# Patient Record
Sex: Female | Born: 1995 | Race: White | Hispanic: No | Marital: Single | Smoking: Never smoker
Health system: Southern US, Community
[De-identification: ages and names within clinical notes are randomized; demographics above are authoritative.]

## PROBLEM LIST (undated history)

## (undated) DIAGNOSIS — E669 Obesity, unspecified: Secondary | ICD-10-CM

## (undated) DIAGNOSIS — F39 Unspecified mood [affective] disorder: Secondary | ICD-10-CM

## (undated) DIAGNOSIS — E039 Hypothyroidism, unspecified: Secondary | ICD-10-CM

## (undated) DIAGNOSIS — Z789 Other specified health status: Secondary | ICD-10-CM

## (undated) DIAGNOSIS — F4310 Post-traumatic stress disorder, unspecified: Secondary | ICD-10-CM

## (undated) DIAGNOSIS — F79 Unspecified intellectual disabilities: Secondary | ICD-10-CM

## (undated) DIAGNOSIS — F909 Attention-deficit hyperactivity disorder, unspecified type: Secondary | ICD-10-CM

## (undated) DIAGNOSIS — F319 Bipolar disorder, unspecified: Secondary | ICD-10-CM

## (undated) DIAGNOSIS — F32A Depression, unspecified: Secondary | ICD-10-CM

## (undated) DIAGNOSIS — F329 Major depressive disorder, single episode, unspecified: Secondary | ICD-10-CM

## (undated) HISTORY — PX: NO PAST SURGERIES: SHX2092

## (undated) HISTORY — PX: OTHER SURGICAL HISTORY: SHX169

---
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
## 2013-01-01 ENCOUNTER — Encounter (HOSPITAL_COMMUNITY): Payer: Self-pay

## 2013-01-01 ENCOUNTER — Emergency Department (HOSPITAL_COMMUNITY)
Admission: EM | Admit: 2013-01-01 | Discharge: 2013-01-03 | Disposition: A | Discharge status: Home / Self Care | Payer: Medicaid Other | Attending: Emergency Medicine | Admitting: Emergency Medicine

## 2013-01-01 DIAGNOSIS — Z79899 Other long term (current) drug therapy: Secondary | ICD-10-CM | POA: Insufficient documentation

## 2013-01-01 DIAGNOSIS — F79 Unspecified intellectual disabilities: Secondary | ICD-10-CM | POA: Diagnosis present

## 2013-01-01 DIAGNOSIS — F3110 Bipolar disorder, current episode manic without psychotic features, unspecified: Secondary | ICD-10-CM

## 2013-01-01 DIAGNOSIS — F39 Unspecified mood [affective] disorder: Secondary | ICD-10-CM | POA: Insufficient documentation

## 2013-01-01 DIAGNOSIS — F319 Bipolar disorder, unspecified: Secondary | ICD-10-CM | POA: Diagnosis present

## 2013-01-01 DIAGNOSIS — E039 Hypothyroidism, unspecified: Secondary | ICD-10-CM | POA: Insufficient documentation

## 2013-01-01 DIAGNOSIS — R451 Restlessness and agitation: Secondary | ICD-10-CM | POA: Diagnosis present

## 2013-01-01 DIAGNOSIS — E669 Obesity, unspecified: Secondary | ICD-10-CM | POA: Insufficient documentation

## 2013-01-01 DIAGNOSIS — Z3202 Encounter for pregnancy test, result negative: Secondary | ICD-10-CM | POA: Insufficient documentation

## 2013-01-01 DIAGNOSIS — IMO0002 Reserved for concepts with insufficient information to code with codable children: Secondary | ICD-10-CM | POA: Insufficient documentation

## 2013-01-01 DIAGNOSIS — F29 Unspecified psychosis not due to a substance or known physiological condition: Secondary | ICD-10-CM

## 2013-01-01 DIAGNOSIS — R45850 Homicidal ideations: Secondary | ICD-10-CM | POA: Insufficient documentation

## 2013-01-01 DIAGNOSIS — E119 Type 2 diabetes mellitus without complications: Secondary | ICD-10-CM | POA: Insufficient documentation

## 2013-01-01 DIAGNOSIS — F909 Attention-deficit hyperactivity disorder, unspecified type: Secondary | ICD-10-CM | POA: Diagnosis present

## 2013-01-01 DIAGNOSIS — F4310 Post-traumatic stress disorder, unspecified: Secondary | ICD-10-CM | POA: Diagnosis present

## 2013-01-01 DIAGNOSIS — M25569 Pain in unspecified knee: Secondary | ICD-10-CM | POA: Insufficient documentation

## 2013-01-01 LAB — ETHANOL: Alcohol, Ethyl (B): <11 mg/dL (ref 0–11)

## 2013-01-01 LAB — CBC
HCT: 37.4 % (ref 36.0–46.0)
Hemoglobin: 12.3 g/dL (ref 12.0–15.0)
MCH: 30.2 pg (ref 26.0–34.0)
MCHC: 32.9 g/dL (ref 30.0–36.0)
MCV: 91.9 fL (ref 78.0–100.0)
Platelets: 287 10*3/uL (ref 150–400)
RBC: 4.07 MIL/uL (ref 3.87–5.11)
RDW: 13.6 % (ref 11.5–15.5)
WBC: 7.3 10*3/uL (ref 4.0–10.5)

## 2013-01-01 LAB — COMPREHENSIVE METABOLIC PANEL
ALT: 13 U/L (ref 0–35)
AST: 17 U/L (ref 0–37)
Albumin: 4 g/dL (ref 3.5–5.2)
Alkaline Phosphatase: 124 U/L — ABNORMAL HIGH (ref 39–117)
BUN: 9 mg/dL (ref 6–23)
CO2: 25 mEq/L (ref 19–32)
Calcium: 9.8 mg/dL (ref 8.4–10.5)
Chloride: 102 mEq/L (ref 96–112)
Creatinine, Ser: 0.61 mg/dL (ref 0.50–1.10)
GFR calc Af Amer: >90 mL/min (ref >90)
GFR calc non Af Amer: >90 mL/min (ref >90)
Glucose, Bld: 86 mg/dL (ref 70–99)
Potassium: 4.4 mEq/L (ref 3.5–5.1)
Sodium: 138 mEq/L (ref 135–145)
Total Bilirubin: 0.2 mg/dL — ABNORMAL LOW (ref 0.3–1.2)
Total Protein: 7.5 g/dL (ref 6.0–8.3)

## 2013-01-01 LAB — RAPID URINE DRUG SCREEN, HOSP PERFORMED
Amphetamines: NOT DETECTED
Barbiturates: NOT DETECTED
Benzodiazepines: NOT DETECTED
Cocaine: NOT DETECTED
Opiates: NOT DETECTED
Tetrahydrocannabinol: NOT DETECTED

## 2013-01-01 LAB — POCT PREGNANCY, URINE: Preg Test, Ur: NEGATIVE

## 2013-01-01 LAB — GLUCOSE, CAPILLARY: Glucose-Capillary: 93 mg/dL (ref 70–99)

## 2013-01-01 MED ORDER — OLANZAPINE 10 MG PO TBDP: 10.0000 mg | ORAL_TABLET | Freq: Every day | ORAL | Status: DC | PRN

## 2013-01-01 MED ORDER — FLUOXETINE HCL 20 MG PO CAPS
20.0000 mg | ORAL_CAPSULE | Freq: Every morning | ORAL | Status: DC
  Administered 2013-01-02 – 2013-01-03 (×2): 20 mg via ORAL
  Filled 2013-01-01 (×2): qty 1

## 2013-01-01 MED ORDER — LACTINEX PO CHEW
1.0000 | CHEWABLE_TABLET | Freq: Three times a day (TID) | ORAL | Status: DC
  Administered 2013-01-02 – 2013-01-03 (×4): 1 via ORAL
  Filled 2013-01-01 (×7): qty 1

## 2013-01-01 MED ORDER — PANTOPRAZOLE SODIUM 40 MG PO TBEC
40.0000 mg | DELAYED_RELEASE_TABLET | Freq: Every day | ORAL | Status: DC
  Administered 2013-01-01 – 2013-01-03 (×3): 40 mg via ORAL
  Filled 2013-01-01 (×3): qty 1

## 2013-01-01 MED ORDER — LACTOBACILLUS PO CAPS: 1.0000 | ORAL_CAPSULE | Freq: Three times a day (TID) | ORAL | Status: DC

## 2013-01-01 MED ORDER — TRAZODONE HCL 100 MG PO TABS
100.0000 mg | ORAL_TABLET | Freq: Every day | ORAL | Status: DC
  Administered 2013-01-01 – 2013-01-02 (×2): 100 mg via ORAL
  Filled 2013-01-01 (×2): qty 1

## 2013-01-01 MED ORDER — IBUPROFEN 800 MG PO TABS
800.0000 mg | ORAL_TABLET | Freq: Once | ORAL | Status: AC
  Administered 2013-01-01: 800 mg via ORAL
  Filled 2013-01-01: qty 1

## 2013-01-01 MED ORDER — LEVOTHYROXINE SODIUM 100 MCG PO TABS
100.0000 ug | ORAL_TABLET | Freq: Every day | ORAL | Status: DC
  Administered 2013-01-02 – 2013-01-03 (×2): 100 ug via ORAL
  Filled 2013-01-01 (×3): qty 1

## 2013-01-01 MED ORDER — CARBAMAZEPINE 200 MG PO TABS
200.0000 mg | ORAL_TABLET | Freq: Two times a day (BID) | ORAL | Status: DC
  Administered 2013-01-01 – 2013-01-03 (×4): 200 mg via ORAL
  Filled 2013-01-01 (×5): qty 1

## 2013-01-01 MED ORDER — LORAZEPAM 1 MG PO TABS
1.0000 mg | ORAL_TABLET | Freq: Every day | ORAL | Status: DC
  Administered 2013-01-01 – 2013-01-02 (×2): 1 mg via ORAL
  Filled 2013-01-01 (×2): qty 1

## 2013-01-01 MED ORDER — METFORMIN HCL 500 MG PO TABS
500.0000 mg | ORAL_TABLET | Freq: Every day | ORAL | Status: DC
  Administered 2013-01-02 – 2013-01-03 (×2): 500 mg via ORAL
  Filled 2013-01-01 (×3): qty 1

## 2013-01-01 MED ORDER — LITHIUM CARBONATE ER 450 MG PO TBCR
450.0000 mg | EXTENDED_RELEASE_TABLET | Freq: Every day | ORAL | Status: DC
  Administered 2013-01-02 – 2013-01-03 (×2): 450 mg via ORAL
  Filled 2013-01-01 (×3): qty 1

## 2013-01-01 MED ORDER — LITHIUM CARBONATE ER 450 MG PO TBCR
900.0000 mg | EXTENDED_RELEASE_TABLET | Freq: Every evening | ORAL | Status: DC
  Administered 2013-01-01 – 2013-01-02 (×2): 900 mg via ORAL
  Filled 2013-01-01 (×3): qty 2

## 2013-01-01 MED ORDER — MEDROXYPROGESTERONE ACETATE 150 MG/ML IM SUSP: 150.0000 mg | INTRAMUSCULAR | Status: DC

## 2013-01-01 MED ORDER — LORAZEPAM 1 MG PO TABS: 1.0000 mg | ORAL_TABLET | Freq: Three times a day (TID) | ORAL | Status: DC | PRN

## 2013-01-01 MED ORDER — RISAQUAD PO CAPS
1.0000 | ORAL_CAPSULE | Freq: Every day | ORAL | Status: DC
  Administered 2013-01-01 – 2013-01-02 (×2): 1 via ORAL
  Filled 2013-01-01 (×3): qty 1

## 2013-01-01 MED ORDER — CALCIUM POLYCARBOPHIL 625 MG PO TABS
625.0000 mg | ORAL_TABLET | Freq: Every day | ORAL | Status: DC
  Administered 2013-01-02 – 2013-01-03 (×2): 625 mg via ORAL
  Filled 2013-01-01 (×4): qty 1

## 2013-01-01 MED ORDER — OLANZAPINE 5 MG PO TABS
5.0000 mg | ORAL_TABLET | Freq: Every day | ORAL | Status: DC
  Administered 2013-01-01 – 2013-01-02 (×2): 5 mg via ORAL
  Filled 2013-01-01 (×2): qty 1

## 2013-01-01 MED ORDER — METFORMIN HCL 500 MG PO TABS
1000.0000 mg | ORAL_TABLET | Freq: Every day | ORAL | Status: DC
  Administered 2013-01-02: 1000 mg via ORAL
  Filled 2013-01-01 (×2): qty 2
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
## 2013-01-01 NOTE — ED Notes (Signed)
Tele-assessment in progress at present.
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
## 2013-01-01 NOTE — ED Notes (Signed)
Pt lives in "[facility]" with a caregiver and has since Jun 05, 2012.  Pt states she is seeing blood everywhere.  Denies SI does feel HI toward her caregiver.  Also c/o diarrhea but denies any other medical issues at this time.  She is calm and cooperative at present time.  Caregiver wants it known that pt is lactose and gluten intolerant and a diabetic.
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
## 2013-01-01 NOTE — Progress Notes (Signed)
CSW received a call from pts care manager, Ronnie Doss, from [facility].  She wanted to call to report that pt often finds reasons to the hospital to get out of her structured environment and get food that is not on her diet.  Ms. Lindley Magnus reports that Ms. Riecke is on a gluten free/diabetic diet. Ms. Lindley Magnus reports that if there are any questions concerning pt that she can be reached at [phone].  Marva Panda, Theresia Majors  [phone]   .01/01/2013  8:27 pm

## 2013-01-02 ENCOUNTER — Inpatient Hospital Stay (HOSPITAL_COMMUNITY)
Admission: AD | Admit: 2013-01-02 | Discharge: 2013-01-02 | Disposition: A | Discharge status: Home / Self Care | Payer: Federal, State, Local not specified - Other | Source: Intra-hospital | Attending: Psychiatry | Admitting: Psychiatry

## 2013-01-02 ENCOUNTER — Encounter (HOSPITAL_COMMUNITY): Payer: Self-pay | Admitting: Registered Nurse

## 2013-01-02 DIAGNOSIS — F29 Unspecified psychosis not due to a substance or known physiological condition: Secondary | ICD-10-CM

## 2013-01-02 LAB — LITHIUM LEVEL: Lithium Lvl: 0.36 mEq/L — ABNORMAL LOW (ref 0.80–1.40)

## 2013-01-02 LAB — GLUCOSE, CAPILLARY
Glucose-Capillary: 93 mg/dL (ref 70–99)
Glucose-Capillary: 89 mg/dL (ref 70–99)
Glucose-Capillary: 82 mg/dL (ref 70–99)

## 2013-01-02 LAB — TSH: TSH: 1.73 u[IU]/mL (ref 0.350–4.500)

## 2013-01-02 NOTE — Progress Notes (Addendum)
CSW consulted with Minerva Areola at [facility] and CSW asst director concerning pt having a DSS guardian.  Patient accepted to [facility], but cannot not sign paperwork due to capacity.  CSW called DSS guardian, Rockne Menghini  @ [phone] and [phone].  Per voicemail, guardian will return @ 7 am.  CSW consulted with EDP concerning possible IVC for pt.  EDP prefers to follow up with guardian for signatures and observe pt tonight.    Marva Panda, Theresia Majors  [phone]  .01/02/2013  5:15 pm  CSW informed Minerva Areola @ [facility] of EDPs concerns and he confirmed that pt would still be accepted on 01/03/13 with guardian signatures.  Marva Panda, LCSWA  [phone]
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
## 2013-01-02 NOTE — ED Provider Notes (Signed)
Medical screening examination/treatment/procedure(s) were performed by non-physician practitioner and as supervising physician I was immediately available for consultation/collaboration.  Flint Melter, MD 01/02/13 [phone]
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
## 2013-01-03 DIAGNOSIS — F909 Attention-deficit hyperactivity disorder, unspecified type: Secondary | ICD-10-CM | POA: Diagnosis present

## 2013-01-03 DIAGNOSIS — R451 Restlessness and agitation: Secondary | ICD-10-CM | POA: Diagnosis present

## 2013-01-03 DIAGNOSIS — F79 Unspecified intellectual disabilities: Secondary | ICD-10-CM | POA: Diagnosis present

## 2013-01-03 DIAGNOSIS — F319 Bipolar disorder, unspecified: Secondary | ICD-10-CM

## 2013-01-03 DIAGNOSIS — F4310 Post-traumatic stress disorder, unspecified: Secondary | ICD-10-CM | POA: Diagnosis present

## 2013-01-03 LAB — GLUCOSE, CAPILLARY: Glucose-Capillary: 94 mg/dL (ref 70–99)

## 2013-01-03 NOTE — Progress Notes (Signed)
CSW left message with  with patient guardian at [phone] and at [phone] regarding patient voluntary admission and consent to release information. CSW will continue to try to reach patient guardian.   Catha Gosselin, LCSW [phone]  ED CSW 01/03/2013 726am

## 2013-01-03 NOTE — Progress Notes (Addendum)
Per discussion with psychiatrist pt denies SI/HI/Ah/VH and psychiatrically stable for discharge back to [facility]. CSW spoke with Ronnie Doss at [phone] and she will arrange Transportation to be provided by pt caregiver [name]. [name] can be reached at [phone].   Marland KitchenFrutoso Schatz [phone]  ED CSW 01/03/2013 9:51am   CSW spoke with Ronnie Doss, and patient transportation is on the way. ED and RN aware.   Frutoso Schatz [phone]  ED CSW 01/03/2013 11:59am

## 2013-01-03 NOTE — Progress Notes (Signed)
Ms Perrault says she is no longer seeing or dreaming blood and has no desire to kill or hurt anyone including her caregiver, Ms Sim Boast.  She says she wants to go home rather than to a hospital.  Says she does not like Ms Sim Boast but can get along with her as she has nowhere else to go.  Assuming it is OK with her caregiver, discharge today.

## 2013-01-03 NOTE — ED Provider Notes (Addendum)
11:38 AM Psych recommending d/c to group home. I re-examined the pt and reviewed her labwork prior to d/c.   11:39 AM:  I have discussed the diagnosis/risks/treatment options with the patient and believe the pt to be eligible for discharge home to follow-up with pcp as needed. We also discussed returning to the ED immediately if new or worsening sx occur. We discussed the sx which are most concerning (e.g., worsening mood disorder) that necessitate immediate return. Any new prescriptions provided to the patient are listed below.  Clinical Impression 1. Bipolar 1 disorder   2. ADHD (attention deficit hyperactivity disorder)   3. PTSD (post-traumatic stress disorder)   4. Mental retardation   5. Agitation      Junius Argyle, MD 01/03/13 2003  Junius Argyle, MD 01/03/13 2003

## 2013-01-03 NOTE — ED Notes (Signed)
Pt is currently denying any SI/HI/AVH. Pt is denying any pain. Pt is clam and cooperative.

## 2013-01-04 NOTE — Consult Note (Signed)
Reviewed the information documented and agree with the treatment plan.  Braison Snoke,JANARDHAHA R. 01/04/2013 11:25 AM

## 2013-02-11 ENCOUNTER — Emergency Department (HOSPITAL_COMMUNITY)
Admission: EM | Admit: 2013-02-11 | Discharge: 2013-02-12 | Disposition: A | Discharge status: Home / Self Care | Payer: Medicaid Other | Attending: Emergency Medicine | Admitting: Emergency Medicine

## 2013-02-11 ENCOUNTER — Encounter (HOSPITAL_COMMUNITY): Payer: Self-pay

## 2013-02-11 DIAGNOSIS — F4310 Post-traumatic stress disorder, unspecified: Secondary | ICD-10-CM | POA: Insufficient documentation

## 2013-02-11 DIAGNOSIS — E119 Type 2 diabetes mellitus without complications: Secondary | ICD-10-CM | POA: Insufficient documentation

## 2013-02-11 DIAGNOSIS — Z3202 Encounter for pregnancy test, result negative: Secondary | ICD-10-CM | POA: Insufficient documentation

## 2013-02-11 DIAGNOSIS — Z79899 Other long term (current) drug therapy: Secondary | ICD-10-CM | POA: Insufficient documentation

## 2013-02-11 DIAGNOSIS — R51 Headache: Secondary | ICD-10-CM | POA: Insufficient documentation

## 2013-02-11 DIAGNOSIS — E669 Obesity, unspecified: Secondary | ICD-10-CM | POA: Insufficient documentation

## 2013-02-11 DIAGNOSIS — F319 Bipolar disorder, unspecified: Secondary | ICD-10-CM | POA: Insufficient documentation

## 2013-02-11 DIAGNOSIS — S51809A Unspecified open wound of unspecified forearm, initial encounter: Secondary | ICD-10-CM | POA: Insufficient documentation

## 2013-02-11 DIAGNOSIS — X789XXA Intentional self-harm by unspecified sharp object, initial encounter: Secondary | ICD-10-CM | POA: Insufficient documentation

## 2013-02-11 DIAGNOSIS — E039 Hypothyroidism, unspecified: Secondary | ICD-10-CM | POA: Insufficient documentation

## 2013-02-11 LAB — CBC WITH DIFFERENTIAL/PLATELET
Basophils Absolute: 0.1 10*3/uL (ref 0.0–0.1)
Basophils Relative: 1 % (ref 0–1)
Eosinophils Absolute: 0.1 10*3/uL (ref 0.0–0.7)
Eosinophils Relative: 2 % (ref 0–5)
HCT: 37.7 % (ref 36.0–46.0)
Hemoglobin: 12.8 g/dL (ref 12.0–15.0)
Lymphocytes Relative: 34 % (ref 12–46)
Lymphs Abs: 3.2 10*3/uL (ref 0.7–4.0)
MCH: 31.6 pg (ref 26.0–34.0)
MCHC: 34 g/dL (ref 30.0–36.0)
MCV: 93.1 fL (ref 78.0–100.0)
Monocytes Absolute: 0.7 10*3/uL (ref 0.1–1.0)
Monocytes Relative: 8 % (ref 3–12)
Neutro Abs: 5.3 10*3/uL (ref 1.7–7.7)
Neutrophils Relative %: 56 % (ref 43–77)
Platelets: 380 10*3/uL (ref 150–400)
RBC: 4.05 MIL/uL (ref 3.87–5.11)
RDW: 13.3 % (ref 11.5–15.5)
WBC: 9.4 10*3/uL (ref 4.0–10.5)

## 2013-02-11 LAB — COMPREHENSIVE METABOLIC PANEL
ALT: 5 U/L (ref 0–35)
AST: 14 U/L (ref 0–37)
Albumin: 3.8 g/dL (ref 3.5–5.2)
Alkaline Phosphatase: 129 U/L — ABNORMAL HIGH (ref 39–117)
BUN: 9 mg/dL (ref 6–23)
CO2: 23 mEq/L (ref 19–32)
Calcium: 9.6 mg/dL (ref 8.4–10.5)
Chloride: 101 mEq/L (ref 96–112)
Creatinine, Ser: 0.62 mg/dL (ref 0.50–1.10)
GFR calc Af Amer: >90 mL/min (ref >90)
GFR calc non Af Amer: >90 mL/min (ref >90)
Glucose, Bld: 95 mg/dL (ref 70–99)
Potassium: 3.5 mEq/L (ref 3.5–5.1)
Sodium: 137 mEq/L (ref 135–145)
Total Bilirubin: 0.1 mg/dL — ABNORMAL LOW (ref 0.3–1.2)
Total Protein: 7.7 g/dL (ref 6.0–8.3)

## 2013-02-11 LAB — LITHIUM LEVEL: Lithium Lvl: 0.63 mEq/L — ABNORMAL LOW (ref 0.80–1.40)

## 2013-02-11 LAB — RAPID URINE DRUG SCREEN, HOSP PERFORMED
Amphetamines: NOT DETECTED
Barbiturates: NOT DETECTED
Benzodiazepines: NOT DETECTED
Cocaine: NOT DETECTED
Opiates: NOT DETECTED
Tetrahydrocannabinol: NOT DETECTED

## 2013-02-11 LAB — POCT PREGNANCY, URINE: Preg Test, Ur: NEGATIVE

## 2013-02-11 LAB — CARBAMAZEPINE LEVEL, TOTAL: Carbamazepine Lvl: 7.5 ug/mL (ref 4.0–12.0)

## 2013-02-11 LAB — ETHANOL: Alcohol, Ethyl (B): <11 mg/dL (ref 0–11)

## 2013-02-11 MED ORDER — ALUM & MAG HYDROXIDE-SIMETH 200-200-20 MG/5ML PO SUSP: 30.0000 mL | ORAL | Status: DC | PRN

## 2013-02-11 MED ORDER — METFORMIN HCL 500 MG PO TABS
500.0000 mg | ORAL_TABLET | Freq: Every day | ORAL | Status: DC
  Administered 2013-02-12: 500 mg via ORAL
  Filled 2013-02-11 (×2): qty 1

## 2013-02-11 MED ORDER — LORAZEPAM 1 MG PO TABS
1.0000 mg | ORAL_TABLET | Freq: Three times a day (TID) | ORAL | Status: DC | PRN
  Filled 2013-02-11: qty 1

## 2013-02-11 MED ORDER — OLANZAPINE 10 MG PO TBDP
10.0000 mg | ORAL_TABLET | Freq: Every day | ORAL | Status: DC | PRN
  Filled 2013-02-11: qty 1

## 2013-02-11 MED ORDER — CARBAMAZEPINE 200 MG PO TABS
200.0000 mg | ORAL_TABLET | Freq: Two times a day (BID) | ORAL | Status: DC
  Administered 2013-02-11 – 2013-02-12 (×2): 200 mg via ORAL
  Filled 2013-02-11 (×3): qty 1

## 2013-02-11 MED ORDER — LITHIUM CARBONATE ER 450 MG PO TBCR
450.0000 mg | EXTENDED_RELEASE_TABLET | Freq: Every day | ORAL | Status: DC
  Administered 2013-02-12: 450 mg via ORAL
  Filled 2013-02-11: qty 1

## 2013-02-11 MED ORDER — METFORMIN HCL 500 MG PO TABS
1000.0000 mg | ORAL_TABLET | Freq: Every day | ORAL | Status: DC
  Administered 2013-02-12: 1000 mg via ORAL
  Filled 2013-02-11: qty 2

## 2013-02-11 MED ORDER — PANTOPRAZOLE SODIUM 40 MG PO TBEC
40.0000 mg | DELAYED_RELEASE_TABLET | Freq: Every day | ORAL | Status: DC
  Administered 2013-02-11 – 2013-02-12 (×2): 40 mg via ORAL
  Filled 2013-02-11 (×2): qty 1

## 2013-02-11 MED ORDER — FLUOXETINE HCL 20 MG PO CAPS
20.0000 mg | ORAL_CAPSULE | Freq: Every morning | ORAL | Status: DC
  Administered 2013-02-12: 20 mg via ORAL
  Filled 2013-02-11: qty 1

## 2013-02-11 MED ORDER — METFORMIN HCL 500 MG PO TABS: 500.0000 mg | ORAL_TABLET | Freq: Two times a day (BID) | ORAL | Status: DC

## 2013-02-11 MED ORDER — ACETAMINOPHEN 325 MG PO TABS: 650.0000 mg | ORAL_TABLET | ORAL | Status: DC | PRN

## 2013-02-11 MED ORDER — OLANZAPINE 5 MG PO TABS
5.0000 mg | ORAL_TABLET | Freq: Every day | ORAL | Status: DC
  Administered 2013-02-11: 5 mg via ORAL
  Filled 2013-02-11: qty 1

## 2013-02-11 MED ORDER — LITHIUM CARBONATE ER 450 MG PO TBCR
900.0000 mg | EXTENDED_RELEASE_TABLET | Freq: Every day | ORAL | Status: DC
  Administered 2013-02-11: 900 mg via ORAL
  Filled 2013-02-11 (×2): qty 2

## 2013-02-11 MED ORDER — LITHIUM CARBONATE ER 450 MG PO TBCR: 450.0000 mg | EXTENDED_RELEASE_TABLET | Freq: Two times a day (BID) | ORAL | Status: DC

## 2013-02-11 MED ORDER — ONDANSETRON HCL 4 MG PO TABS: 4.0000 mg | ORAL_TABLET | Freq: Three times a day (TID) | ORAL | Status: DC | PRN

## 2013-02-11 MED ORDER — TRAZODONE HCL 100 MG PO TABS
100.0000 mg | ORAL_TABLET | Freq: Every day | ORAL | Status: DC
  Administered 2013-02-11: 100 mg via ORAL
  Filled 2013-02-11: qty 1

## 2013-02-11 MED ORDER — LEVOTHYROXINE SODIUM 100 MCG PO TABS
100.0000 ug | ORAL_TABLET | Freq: Every day | ORAL | Status: DC
  Administered 2013-02-12: 100 ug via ORAL
  Filled 2013-02-11 (×2): qty 1

## 2013-02-11 NOTE — ED Notes (Signed)
Group Home called [facility] regarding patient. Patient went into the woods and cut self on her left arm multiple times. Cuts superficial to left arm. Patient has purple hands and states she ate some kind of berries in the woods. Patient c/o "upset stomach." Patient states she is suicidal, but does not have a plan. Patient deneis any alcohol or drug use.

## 2013-02-11 NOTE — ED Notes (Signed)
2 belonging bags placed in locker 27

## 2013-02-11 NOTE — Progress Notes (Signed)
Patient confirms her pcp is Dr. Parke Simmers.  Patient reports, "It's been awhile"  since she has seen Dr. Parke Simmers.

## 2013-02-11 NOTE — ED Provider Notes (Signed)
CSN: [id number]     Arrival date & time 02/11/13  1604 History  This chart was scribed for Arthor Captain PA-C, a non-physician practitioner working with Juliet Rude. Rubin Payor, MD by Lewanda Rife, ED Scribe. This patient was seen in room [facility] and the patient's care was started at 6:34 PM       Chief Complaint  Patient presents with  . Medical Clearance  . Suicidal   The history is provided by the patient. No language interpreter was used.   HPI Comments: Holly [name] is a 17 y.o. female  With mild MR who presents to the Emergency Department complaining of superficial lacerations on bilateral forearms. Reports she was switched from Day program 2 weeks ago and this precipitated suicidal ideations. Reports suicidal ideations have resolved at this time. Reports she feels safe at home. Reports eating three unidentified berries in the woods earlier today. Reports mild headache. Denies asociated nausea, emesis, diarrhea, chest pain, and shortness of breath. Denies any pain at this time.   Past Medical History  Diagnosis Date  . Bipolar 1 disorder   . Gluten free diet   . ADHD (attention deficit hyperactivity disorder)   . PTSD (post-traumatic stress disorder)   . 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 Mood disorder   . Diabetes mellitus   . Obesity   . Depression   . Hypothyroidism    Past Surgical History  Procedure Laterality Date  . None     Family History  Problem Relation Age of Onset  . Family history unknown: Yes   History  Substance Use Topics  . Smoking status: Never Smoker   . Smokeless tobacco: Never Used  . Alcohol Use: No   OB History   Grav Para Term Preterm Abortions TAB SAB Ect Mult Living                 Review of Systems  Gastrointestinal: Negative.   Neurological: Positive for headaches.  Psychiatric/Behavioral: Negative for suicidal ideas.  All other systems reviewed and are negative.   A complete 10 system review of systems was obtained  and all systems are negative except as noted in the HPI and PMHx.    Allergies  Gluten meal and Lactose intolerance (gi)  Home Medications   Current Outpatient Rx  Name  Route  Sig  Dispense  Refill  . adapalene (DIFFERIN) 0.1 % cream   Topical   Apply 1 application topically at bedtime. Applied to face for acne.         . carbamazepine (TEGRETOL) 200 MG tablet   Oral   Take 1 tablet (200 mg total) by mouth 2 (two) times daily.   60 tablet   0   . clindamycin-benzoyl peroxide (BENZACLIN) gel   Topical   Apply 1 application topically every morning. Applied to face for acne.         Marland Kitchen FLUoxetine (PROZAC) 20 MG capsule   Oral   Take 20 mg by mouth every morning.         Marland Kitchen LACTOBACILLUS PO   Oral   Take 1 tablet by mouth 3 (three) times daily.          Marland Kitchen levothyroxine (SYNTHROID, LEVOTHROID) 100 MCG tablet   Oral   Take 100 mcg by mouth daily before breakfast.          . lithium carbonate (ESKALITH) 450 MG CR tablet   Oral   Take 450-900 mg by mouth 2 (  two) times daily. Takes 1 tablet in morning and 2 tablets in evening         . LORazepam (ATIVAN) 1 MG tablet   Oral   Take 1 mg by mouth at bedtime.         Marland Kitchen LORazepam (ATIVAN) 1 MG tablet   Oral   Take 1 mg by mouth every 8 (eight) hours as needed for anxiety.         . medroxyPROGESTERone (DEPO-PROVERA) 150 MG/ML injection   Intramuscular   Inject 150 mg into the muscle every 3 (three) months.         . metFORMIN (GLUCOPHAGE) 500 MG tablet   Oral   Take 500-1,000 mg by mouth 2 (two) times daily with a meal. 500 mg in the morning and 1000 mg at bedtime         . OLANZapine (ZYPREXA) 5 MG tablet   Oral   Take 5 mg by mouth at bedtime.         Marland Kitchen OLANZapine zydis (ZYPREXA) 10 MG disintegrating tablet   Oral   Take 10 mg by mouth daily as needed (behavior).         Marland Kitchen omeprazole (PRILOSEC) 20 MG capsule   Oral   Take 20 mg by mouth every morning.          . polycarbophil (FIBERCON)  625 MG tablet   Oral   Take 625 mg by mouth daily.         . Probiotic Product (ALIGN) 4 MG CAPS   Oral   Take 1 capsule by mouth at bedtime.          . traZODone (DESYREL) 100 MG tablet   Oral   Take 100 mg by mouth at bedtime.          Triage Vitals:BP 121/78  Pulse 86  Temp(Src) 97.8 F (36.6 C) (Oral)  Resp 16  SpO2 98%  Physical Exam  Nursing note and vitals reviewed. Constitutional: She is oriented to person, place, and time. She appears well-developed and well-nourished. No distress.  HENT:  Head: Normocephalic and atraumatic.  Eyes: EOM are normal.  Neck: Neck supple. No tracheal deviation present.  Cardiovascular: Normal rate and intact distal pulses.   No murmur heard. Pulmonary/Chest: Effort normal. No respiratory distress.  Abdominal: Soft.  Musculoskeletal: Normal range of motion.  Neurological: She is alert and oriented to person, place, and time.  Skin: Skin is warm and dry. Laceration noted.  Multiple well healing uninfected self inflicted superficial lacerations of bilateral forearms.  Psychiatric: She has a normal mood and affect. Her behavior is normal. Thought content normal. She expresses no homicidal and no suicidal ideation.    ED Course  Procedures  DIAGNOSTIC STUDIES: Oxygen Saturation is 98% on RA, normal by my interpretation.   Medications - No data to display  COORDINATION OF CARE: 5:01 PM-Discussed treatment plan which includes Ethanol, salicylate level, acetaminophen level, CMP, CBC,, POCT pregnancy and UDS with pt at bedside and pt agreed to plan.   Labs Review Labs Reviewed  COMPREHENSIVE METABOLIC PANEL - Abnormal; Notable for the following:    Alkaline Phosphatase 129 (*)    Total Bilirubin 0.1 (*)    All other components within normal limits  CBC WITH DIFFERENTIAL  ETHANOL  URINE RAPID DRUG SCREEN (HOSP PERFORMED)  CARBAMAZEPINE LEVEL, TOTAL  LITHIUM LEVEL  POCT PREGNANCY, URINE   Imaging Review No results  found.  MDM  No diagnosis found. Patient with mild  MR, psych history. Self cutting. She is jovial, smiling and laughing at this time. Patient exhibits no sxs of poisoning,, and her labs appears stable. I have discussed the case with Dr. Rubin Payor who agrees that no further work up regarding the berry eating is necessary at this time.  Patient will be evaluated for SI/ Self-harm.  Patient denies any current Si/HI/AVH. She is taking all of her medications as directed.   I personally performed the services described in this documentation, which was scribed in my presence. The recorded information has been reviewed and is accurate.    Arthor Captain, PA-C 02/12/13 1103

## 2013-02-12 LAB — GLUCOSE, CAPILLARY: Glucose-Capillary: 119 mg/dL — ABNORMAL HIGH (ref 70–99)

## 2013-02-12 NOTE — ED Notes (Signed)
MD at bedside. 

## 2013-02-12 NOTE — Consult Note (Signed)
  Psychiatric Specialty Exam: @PHYSEXAMBYAGE2 @  @ROS @  Blood pressure 99/66, pulse 99, temperature 97.9 F (36.6 C), temperature source Oral, resp. rate 20, SpO2 98.00%.There is no weight on file to calculate BMI.  General Appearance: Casual  Eye Contact::  Good  Speech:  Clear and Coherent  Volume:  Normal  Mood:  Euthymic  Affect:  Appropriate  Thought Process:  Coherent and Logical  Orientation:  Full (Time, Place, and Person)  Thought Content:  Negative  Suicidal Thoughts:  No  Homicidal Thoughts:  No  Memory:  Immediate;   Fair Recent;   Fair Remote;   Fair  Judgement:  Intact  Insight:  Lacking  Psychomotor Activity:  Normal  Concentration:  Good  Recall:  Good  Akathisia:  Negative  Handed:  Right  AIMS (if indicated):     Assets:  Housing Physical Health  Sleep:   good   Ms Iseminger is not suicidal.  Says she scratched herself when she was upset but did not want to kill herself.  She is unhappy with her day program but not her group home.  Discharge today to be followed by outpatient.

## 2013-02-12 NOTE — BHH Counselor (Signed)
Writer left a Recruitment consultant message for the DSS worker Rockne Menghini [phone]).  Writer was not able to leave a voice mail message for the emergency contact person listed on the face sheet Violet Baldy [phone]) because the memory was full.   Writer informed the nurse working with the patient that the Clinical research associate is waiting to hear from the patients DSS worker.

## 2013-02-12 NOTE — BH Assessment (Signed)
Spoke with EDP Dr. Blinda Leatherwood who agreed d/c patient from ER. Pt's ride is here from group home and ready transport patient back to group home.  Glorious Peach, MS, LCASA Assessment Counselor
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
## 2013-02-12 NOTE — ED Provider Notes (Signed)
Patient not seen by me and I was not directly involved in the patient care. Behavioral Health/Psychiatry has evaluated the patient and, as per their note and recommendation, patient was to be discharged. Follow-up recommendations and medications determined by/prescribed by Behavioral Health/Psychiatry.   Gilda Crease, MD 02/12/13 [id number]

## 2013-02-12 NOTE — ED Notes (Signed)
Patient is resting comfortably. 

## 2013-02-12 NOTE — ED Notes (Signed)
Patient is resting comfortably. Given warm blanket.

## 2013-02-12 NOTE — ED Provider Notes (Signed)
Medical screening examination/treatment/procedure(s) were performed by non-physician practitioner and as supervising physician I was immediately available for consultation/collaboration.  Reyaansh Merlo R. Eldena Dede, MD 02/12/13 1431 

## 2013-02-12 NOTE — ED Notes (Signed)
Tele consult in progress 

## 2013-02-12 NOTE — BHH Counselor (Signed)
Writer spoke to the patients worker at the [employer] [phone]).  Writer was informed that she would be coming to pick up the patient between 5:30 and 6:00pm.   Writer informed the nurse Victorino Dike) working with the patient that the worker would be coming to pick up the patient during the designated time frame.

## 2013-02-12 NOTE — Progress Notes (Signed)
02/12/13 1332 [facility] ED AM CM informed by nursing staff that pt voiced concern about not having a glucometer at her facility, [facility], and to inquire about the name of the adult day program pt attends Left a message for Crystal at [phone] Cm informed Crystal was in a meeting

## 2013-02-13 NOTE — Progress Notes (Signed)
CM contacted by [employer] [facility] about pt and possible further resources for pt Cm spoke with SW.

## 2013-02-13 NOTE — Progress Notes (Signed)
[facility] ED CM received a return call from [employer] [phone]) Cm spoke with her and she confirms pt does have a glucometer at the facility [facility]) and her adult day care program name is [facility] [address] [place], [place] [address] [phone]. Cm discussed that chs does not provided glucometers and discounted stores that provided glucometers Crystal voiced concern about the need for a program to assist with preventing pt from having frequent ED visits. Reports pt does not qualify for ACT program/member through their services with the coverage she has.  Discussed with her that Cm would inquire of TTS of a possible resource and of possible development of care plan and have someone to contact her prn

## 2013-03-01 ENCOUNTER — Other Ambulatory Visit: Payer: Self-pay | Admitting: Family Medicine

## 2013-03-01 ENCOUNTER — Other Ambulatory Visit (HOSPITAL_COMMUNITY)
Admission: RE | Admit: 2013-03-01 | Discharge: 2013-03-01 | Disposition: A | Discharge status: Home / Self Care | Payer: Medicaid Other | Source: Ambulatory Visit | Attending: Family Medicine | Admitting: Family Medicine

## 2013-03-01 DIAGNOSIS — Z113 Encounter for screening for infections with a predominantly sexual mode of transmission: Secondary | ICD-10-CM | POA: Insufficient documentation

## 2013-03-01 DIAGNOSIS — N760 Acute vaginitis: Secondary | ICD-10-CM | POA: Insufficient documentation

## 2013-03-20 ENCOUNTER — Emergency Department (HOSPITAL_COMMUNITY)
Admission: EM | Admit: 2013-03-20 | Discharge: 2013-03-20 | Disposition: A | Discharge status: Home / Self Care | Payer: Medicaid Other | Attending: Emergency Medicine | Admitting: Emergency Medicine

## 2013-03-20 DIAGNOSIS — F70 Mild intellectual disabilities: Secondary | ICD-10-CM | POA: Insufficient documentation

## 2013-03-20 DIAGNOSIS — F4310 Post-traumatic stress disorder, unspecified: Secondary | ICD-10-CM | POA: Insufficient documentation

## 2013-03-20 DIAGNOSIS — E119 Type 2 diabetes mellitus without complications: Secondary | ICD-10-CM | POA: Insufficient documentation

## 2013-03-20 DIAGNOSIS — Y9389 Activity, other specified: Secondary | ICD-10-CM | POA: Insufficient documentation

## 2013-03-20 DIAGNOSIS — F411 Generalized anxiety disorder: Secondary | ICD-10-CM | POA: Insufficient documentation

## 2013-03-20 DIAGNOSIS — IMO0002 Reserved for concepts with insufficient information to code with codable children: Secondary | ICD-10-CM | POA: Insufficient documentation

## 2013-03-20 DIAGNOSIS — F79 Unspecified intellectual disabilities: Secondary | ICD-10-CM

## 2013-03-20 DIAGNOSIS — S0990XA Unspecified injury of head, initial encounter: Secondary | ICD-10-CM

## 2013-03-20 DIAGNOSIS — S060X0A Concussion without loss of consciousness, initial encounter: Secondary | ICD-10-CM | POA: Insufficient documentation

## 2013-03-20 DIAGNOSIS — Z3202 Encounter for pregnancy test, result negative: Secondary | ICD-10-CM | POA: Insufficient documentation

## 2013-03-20 DIAGNOSIS — Z79899 Other long term (current) drug therapy: Secondary | ICD-10-CM | POA: Insufficient documentation

## 2013-03-20 DIAGNOSIS — W2209XA Striking against other stationary object, initial encounter: Secondary | ICD-10-CM | POA: Insufficient documentation

## 2013-03-20 DIAGNOSIS — E039 Hypothyroidism, unspecified: Secondary | ICD-10-CM | POA: Insufficient documentation

## 2013-03-20 DIAGNOSIS — Y9229 Other specified public building as the place of occurrence of the external cause: Secondary | ICD-10-CM | POA: Insufficient documentation

## 2013-03-20 DIAGNOSIS — R45851 Suicidal ideations: Secondary | ICD-10-CM | POA: Insufficient documentation

## 2013-03-20 DIAGNOSIS — F319 Bipolar disorder, unspecified: Secondary | ICD-10-CM | POA: Insufficient documentation

## 2013-03-20 DIAGNOSIS — F410 Panic disorder [episodic paroxysmal anxiety] without agoraphobia: Secondary | ICD-10-CM

## 2013-03-20 DIAGNOSIS — E669 Obesity, unspecified: Secondary | ICD-10-CM | POA: Insufficient documentation

## 2013-03-20 LAB — POCT PREGNANCY, URINE: Preg Test, Ur: NEGATIVE

## 2013-03-20 NOTE — ED Notes (Signed)
Call attempt to group home again, no answer, CW notified

## 2013-03-20 NOTE — ED Notes (Signed)
[name] here to pick up pt, states she was at work.
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
## 2013-03-20 NOTE — ED Notes (Signed)
Call attempted again to Group home[facility], no answer

## 2013-03-20 NOTE — Progress Notes (Signed)
[facility] ED CM contacted [facility] ED SW to receive assist with transportation and to assist with caregiver issues. Care giver has not arrived to pick pt up after called by ED RN and pt leaving messages Pt states caregiver, [name] and she lives in [place]

## 2013-03-20 NOTE — Progress Notes (Signed)
CSW consulted by case management concerning pts being discharged and not being able to get any one from pts group home to come to pick patient up for several hours.  CSW attempted to call  group home contact Holly [name] at [phone] two times. CSW was unable to leave a message because voicemail indicated that it was full.    CSW attempted to call additional number in pts record [phone] and it was not a working number.   CSW consulted with pts nurse who reported that she had tried to call group home several times and was not able to get an answer or leave a message.  Nurse reported that she had spoken with Holly [name] earlier in day to let her know that pt was ready to be picked up and that Holly [name] asked where pt was and then hung up on nurse.  CSW spoke with pt at bedside with pt to inquire if she still has a Child psychotherapist at [employer].  Pt reports that her social worker is Holly [name], but that she would not be able to be reached now.   CSW called Rockne Menghini at [phone] and left a message.  CSW contacted contacted DSS on-call to inquire about additional contact information and ask about what would need to be done if hospital was not able to get in contact with some one at the group home.  CSW received a call from DSS on call worker Holly [name], who reported that there was an additional number for Holly [name] [phone].  On call DSS worker asked CSW to call if hospital was still unable to contact pts caregiver. His direct number [phone]. CSW  Attempted to call the number and left a message on voicemail.  CSW checked in with nurse who reported that Holly [name] had just left with pt and pt was discharged at 7:03 pm. CSW contacted on call DSS worker Holly [name] to let him know that pt had been picked up.  On call worker reported that he would make contact with pts DSS worker Holly [name] tomorrow concerning pt and the situation so that the worker could follow up with caregiver and  pt.   Holly [name], Holly [name]  [phone]  .03/20/2013

## 2013-03-20 NOTE — ED Notes (Signed)
Pt called back to group home to see why her ride has not got here yet, no answer

## 2013-03-20 NOTE — ED Provider Notes (Signed)
CSN: [id number]     Arrival date & time 03/20/13  1448 History   First MD Initiated Contact with Patient 03/20/13 1511     Chief Complaint  Patient presents with  . 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, bipolar disorder, chronic anxiety, borderline diabetes, chronic depression, chronic mild suicidal ideation without plan, she is in a new group home for the last couple months, she also has been a new consult a program for the last couple months, she states she was feeling fine all day today until she got upset about some food that was presented to her, she then had an argument with the group home staff, she bumped her head against the wall which resulted in a temporary tenderness to her scalp which is gone now, she also had some superficial abrasions to her hands, she states her tetanus shot is up-to-date, she is no headache now and she is no amnesia no loss of consciousness no change in mental status no change in speech vision swallowing or understanding no focal weakness numbness or change in bowel or bladder function or change in coordination, she is no neck pain back pain chest pain shortness breath or abdominal pain, she denies any suicidal plan, she states she will not hurt herself or others, she states she has baseline mild chronic suicidal ideation which is unchanged in months, she denies any threats to harm others, she denies any hallucinations, she states she has calmed down and is back to baseline now.  Past Medical History  Diagnosis Date  . Bipolar 1 disorder   . Gluten free diet   . ADHD (attention deficit hyperactivity disorder)   . PTSD (post-traumatic stress disorder)   . 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 Mood disorder   . Diabetes mellitus   . Obesity   . Depression   . Hypothyroidism    Past Surgical History  Procedure Laterality Date   . None     Family History  Problem Relation Age of Onset  . Family history unknown: Yes   History  Substance Use Topics  . Smoking status: Never Smoker   . Smokeless tobacco: Never Used  . Alcohol Use: No   OB History   Grav Para Term Preterm Abortions TAB SAB Ect Mult Living                 Review of Systems 10 Systems reviewed and are negative for acute change except as noted in the HPI. Allergies  Gluten meal and Lactose intolerance (gi)  Home Medications   Current Outpatient Rx  Name  Route  Sig  Dispense  Refill  . adapalene (DIFFERIN) 0.1 % cream   Topical   Apply 1 application topically at bedtime. Applied to face for acne.         . carbamazepine (TEGRETOL) 200 MG tablet   Oral   Take 1 tablet (200 mg total) by mouth 2 (two) times daily.   60 tablet   0   . clindamycin-benzoyl peroxide (BENZACLIN) gel   Topical   Apply 1 application topically every morning. Applied to face for acne.         Marland Kitchen FLUoxetine (PROZAC) 20 MG capsule   Oral   Take 20 mg by mouth every morning.         Marland Kitchen levothyroxine (SYNTHROID, LEVOTHROID) 100 MCG tablet   Oral   Take 100 mcg by mouth daily  before breakfast.          . lithium carbonate (ESKALITH) 450 MG CR tablet   Oral   Take 450-900 mg by mouth 2 (two) times daily. Takes 1 tablet in morning and 2 tablets in evening         . LORazepam (ATIVAN) 1 MG tablet   Oral   Take 1 mg by mouth every 8 (eight) hours as needed for anxiety.         . medroxyPROGESTERone (DEPO-PROVERA) 150 MG/ML injection   Intramuscular   Inject 150 mg into the muscle every 3 (three) months.         . metFORMIN (GLUCOPHAGE) 500 MG tablet   Oral   Take 500-1,000 mg by mouth 2 (two) times daily with a meal. 500 mg in the morning and 1000 mg at bedtime         . OLANZapine zydis (ZYPREXA) 10 MG disintegrating tablet   Oral   Take 10 mg by mouth daily as needed (behavior).         Marland Kitchen omeprazole (PRILOSEC) 20 MG capsule    Oral   Take 20 mg by mouth every morning.          . polycarbophil (FIBERCON) 625 MG tablet   Oral   Take 625 mg by mouth daily.         . traZODone (DESYREL) 100 MG tablet   Oral   Take 100 mg by mouth at bedtime.          BP 123/78  Pulse 88  Temp(Src) 98.3 F (36.8 C) (Oral)  Resp 18  SpO2 97% Physical Exam  Nursing note and vitals reviewed. Constitutional: She is oriented to person, place, and time.  Awake, alert, nontoxic appearance with baseline speech for patient.  HENT:  Head: Atraumatic.  Mouth/Throat: No oropharyngeal exudate.  Eyes: EOM are normal. Pupils are equal, round, and reactive to light. Right eye exhibits no discharge. Left eye exhibits no discharge.  Neck: Neck supple.  Cardiovascular: Normal rate and regular rhythm.   No murmur heard. Pulmonary/Chest: Effort normal and breath sounds normal. No stridor. No respiratory distress. She has no wheezes. She has no rales. She exhibits no tenderness.  Abdominal: Soft. Bowel sounds are normal. She exhibits no mass. There is no tenderness. There is no rebound.  Musculoskeletal: She exhibits no tenderness.  Baseline ROM, moves extremities with no obvious new focal weakness.  Lymphadenopathy:    She has no cervical adenopathy.  Neurological: She is alert and oriented to person, place, and time.  Awake, alert, cooperative and aware of situation; motor strength bilaterally; sensation normal to light touch bilaterally; peripheral visual fields full to confrontation; no facial asymmetry; tongue midline; major cranial nerves appear intact; no pronator drift, normal finger to nose bilaterally, baseline gait without new ataxia.  Skin: No rash noted.  Psychiatric: She has a normal mood and affect.  Calm cooperative states will not hurt herself or others    ED Course  Procedures (including critical care time) Labs Review Labs Reviewed  POCT PREGNANCY, URINE   Imaging Review No results found.  EKG  Interpretation   None       MDM   1. Anxiety attack   2. Mental retardation   3. Minor head injury without loss of consciousness, initial encounter    I doubt any other EMC precluding discharge at this time including, but not necessarily limited to the following: SI requiring admit, any HI, or psychosis.  Hurman Horn, MD 03/25/13 [phone]

## 2013-03-20 NOTE — ED Notes (Signed)
I spoke to Holly [name], notified pt is being d/c's and needs transport, she asked where she was and I told her and she hung up. Pt getting dress and will sit in consult room and wait for ride

## 2013-04-19 ENCOUNTER — Encounter (HOSPITAL_COMMUNITY): Payer: Self-pay | Admitting: Emergency Medicine

## 2013-04-19 ENCOUNTER — Emergency Department (HOSPITAL_COMMUNITY)
Admission: EM | Admit: 2013-04-19 | Discharge: 2013-04-20 | Disposition: A | Discharge status: Home / Self Care | Payer: Medicaid Other | Attending: Emergency Medicine | Admitting: Emergency Medicine

## 2013-04-19 DIAGNOSIS — H5316 Psychophysical visual disturbances: Secondary | ICD-10-CM | POA: Insufficient documentation

## 2013-04-19 DIAGNOSIS — F79 Unspecified intellectual disabilities: Secondary | ICD-10-CM

## 2013-04-19 DIAGNOSIS — E669 Obesity, unspecified: Secondary | ICD-10-CM | POA: Insufficient documentation

## 2013-04-19 DIAGNOSIS — R443 Hallucinations, unspecified: Secondary | ICD-10-CM

## 2013-04-19 DIAGNOSIS — E039 Hypothyroidism, unspecified: Secondary | ICD-10-CM | POA: Insufficient documentation

## 2013-04-19 DIAGNOSIS — E119 Type 2 diabetes mellitus without complications: Secondary | ICD-10-CM | POA: Insufficient documentation

## 2013-04-19 DIAGNOSIS — F29 Unspecified psychosis not due to a substance or known physiological condition: Secondary | ICD-10-CM

## 2013-04-19 DIAGNOSIS — F319 Bipolar disorder, unspecified: Secondary | ICD-10-CM

## 2013-04-19 DIAGNOSIS — F4310 Post-traumatic stress disorder, unspecified: Secondary | ICD-10-CM

## 2013-04-19 DIAGNOSIS — Z79899 Other long term (current) drug therapy: Secondary | ICD-10-CM | POA: Insufficient documentation

## 2013-04-19 DIAGNOSIS — Z3202 Encounter for pregnancy test, result negative: Secondary | ICD-10-CM | POA: Insufficient documentation

## 2013-04-19 LAB — COMPREHENSIVE METABOLIC PANEL
ALT: 7 U/L (ref 0–35)
AST: 12 U/L (ref 0–37)
Albumin: 3.9 g/dL (ref 3.5–5.2)
Alkaline Phosphatase: 102 U/L (ref 39–117)
BUN: 7 mg/dL (ref 6–23)
CO2: 22 mEq/L (ref 19–32)
Calcium: 9.4 mg/dL (ref 8.4–10.5)
Chloride: 100 mEq/L (ref 96–112)
Creatinine, Ser: 0.6 mg/dL (ref 0.50–1.10)
GFR calc Af Amer: >90 mL/min (ref >90)
GFR calc non Af Amer: >90 mL/min (ref >90)
Glucose, Bld: 95 mg/dL (ref 70–99)
Potassium: 3.3 mEq/L — ABNORMAL LOW (ref 3.5–5.1)
Sodium: 133 mEq/L — ABNORMAL LOW (ref 135–145)
Total Bilirubin: 0.1 mg/dL — ABNORMAL LOW (ref 0.3–1.2)
Total Protein: 7.6 g/dL (ref 6.0–8.3)

## 2013-04-19 LAB — CBC
HCT: 35.9 % — ABNORMAL LOW (ref 36.0–46.0)
Hemoglobin: 12.3 g/dL (ref 12.0–15.0)
MCH: 31.8 pg (ref 26.0–34.0)
MCHC: 34.3 g/dL (ref 30.0–36.0)
MCV: 92.8 fL (ref 78.0–100.0)
Platelets: 313 10*3/uL (ref 150–400)
RBC: 3.87 MIL/uL (ref 3.87–5.11)
RDW: 12.3 % (ref 11.5–15.5)
WBC: 9.6 10*3/uL (ref 4.0–10.5)

## 2013-04-19 LAB — ACETAMINOPHEN LEVEL: Acetaminophen (Tylenol), Serum: <15 ug/mL (ref 10–30)

## 2013-04-19 LAB — RAPID URINE DRUG SCREEN, HOSP PERFORMED
Amphetamines: NOT DETECTED
Barbiturates: NOT DETECTED
Benzodiazepines: NOT DETECTED
Cocaine: NOT DETECTED
Opiates: NOT DETECTED
Tetrahydrocannabinol: NOT DETECTED

## 2013-04-19 LAB — ETHANOL: Alcohol, Ethyl (B): <11 mg/dL (ref 0–11)

## 2013-04-19 LAB — POCT PREGNANCY, URINE: Preg Test, Ur: NEGATIVE

## 2013-04-19 LAB — GLUCOSE, CAPILLARY: Glucose-Capillary: 104 mg/dL — ABNORMAL HIGH (ref 70–99)

## 2013-04-19 LAB — SALICYLATE LEVEL: Salicylate Lvl: <2 mg/dL — ABNORMAL LOW (ref 2.8–20.0)

## 2013-04-19 MED ORDER — PANTOPRAZOLE SODIUM 40 MG PO TBEC
40.0000 mg | DELAYED_RELEASE_TABLET | Freq: Every day | ORAL | Status: DC
  Administered 2013-04-19: 40 mg via ORAL
  Filled 2013-04-19: qty 1

## 2013-04-19 MED ORDER — LEVOTHYROXINE SODIUM 100 MCG PO TABS
100.0000 ug | ORAL_TABLET | Freq: Every day | ORAL | Status: DC
  Administered 2013-04-20: 100 ug via ORAL
  Filled 2013-04-19 (×2): qty 1

## 2013-04-19 MED ORDER — ONDANSETRON HCL 4 MG PO TABS: 4.0000 mg | ORAL_TABLET | Freq: Three times a day (TID) | ORAL | Status: DC | PRN

## 2013-04-19 MED ORDER — LITHIUM CARBONATE ER 450 MG PO TBCR
450.0000 mg | EXTENDED_RELEASE_TABLET | Freq: Every day | ORAL | Status: DC
  Administered 2013-04-20: 450 mg via ORAL
  Filled 2013-04-19: qty 1

## 2013-04-19 MED ORDER — OLANZAPINE 10 MG PO TBDP: 10.0000 mg | ORAL_TABLET | Freq: Every day | ORAL | Status: DC | PRN

## 2013-04-19 MED ORDER — FLUOXETINE HCL 20 MG PO CAPS
20.0000 mg | ORAL_CAPSULE | Freq: Every morning | ORAL | Status: DC
  Administered 2013-04-20: 20 mg via ORAL
  Filled 2013-04-19: qty 1

## 2013-04-19 MED ORDER — LORAZEPAM 1 MG PO TABS: 1.0000 mg | ORAL_TABLET | Freq: Three times a day (TID) | ORAL | Status: DC | PRN

## 2013-04-19 MED ORDER — CARBAMAZEPINE 200 MG PO TABS
200.0000 mg | ORAL_TABLET | Freq: Two times a day (BID) | ORAL | Status: DC
  Administered 2013-04-19 – 2013-04-20 (×2): 200 mg via ORAL
  Filled 2013-04-19 (×3): qty 1

## 2013-04-19 MED ORDER — POTASSIUM CHLORIDE CRYS ER 20 MEQ PO TBCR
40.0000 meq | EXTENDED_RELEASE_TABLET | Freq: Once | ORAL | Status: AC
  Administered 2013-04-19: 40 meq via ORAL
  Filled 2013-04-19: qty 2

## 2013-04-19 MED ORDER — NICOTINE 21 MG/24HR TD PT24: 21.0000 mg | MEDICATED_PATCH | Freq: Every day | TRANSDERMAL | Status: DC

## 2013-04-19 MED ORDER — METFORMIN HCL 500 MG PO TABS
500.0000 mg | ORAL_TABLET | Freq: Every day | ORAL | Status: DC
  Administered 2013-04-20: 500 mg via ORAL
  Filled 2013-04-19 (×2): qty 1

## 2013-04-19 MED ORDER — TRAZODONE HCL 100 MG PO TABS
100.0000 mg | ORAL_TABLET | Freq: Every day | ORAL | Status: DC
  Administered 2013-04-19: 100 mg via ORAL
  Filled 2013-04-19: qty 1

## 2013-04-19 MED ORDER — ALUM & MAG HYDROXIDE-SIMETH 200-200-20 MG/5ML PO SUSP: 30.0000 mL | ORAL | Status: DC | PRN

## 2013-04-19 MED ORDER — METFORMIN HCL 500 MG PO TABS
1000.0000 mg | ORAL_TABLET | Freq: Every day | ORAL | Status: DC
  Administered 2013-04-19: 1000 mg via ORAL
  Filled 2013-04-19 (×2): qty 2

## 2013-04-19 MED ORDER — ZOLPIDEM TARTRATE 5 MG PO TABS: 5.0000 mg | ORAL_TABLET | Freq: Every evening | ORAL | Status: DC | PRN

## 2013-04-19 MED ORDER — LITHIUM CARBONATE ER 450 MG PO TBCR
900.0000 mg | EXTENDED_RELEASE_TABLET | Freq: Every day | ORAL | Status: DC
  Administered 2013-04-19: 900 mg via ORAL
  Filled 2013-04-19 (×2): qty 2

## 2013-04-19 MED ORDER — CALCIUM POLYCARBOPHIL 625 MG PO TABS
625.0000 mg | ORAL_TABLET | Freq: Every day | ORAL | Status: DC
  Administered 2013-04-20: 625 mg via ORAL
  Filled 2013-04-19: qty 1

## 2013-04-19 MED ORDER — IBUPROFEN 200 MG PO TABS: 600.0000 mg | ORAL_TABLET | Freq: Three times a day (TID) | ORAL | Status: DC | PRN

## 2013-04-19 NOTE — ED Provider Notes (Signed)
Medical screening examination/treatment/procedure(s) were performed by non-physician practitioner and as supervising physician I was immediately available for consultation/collaboration.  EKG Interpretation   None         Richardean Canal, MD 04/19/13 2248

## 2013-04-19 NOTE — Progress Notes (Signed)
Write was asked to seek placement for pt. Writer faxed referral to [facility] and [facility].
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
## 2013-04-19 NOTE — ED Notes (Signed)
Pt aaox3.  Pt calm and cooperative.  Pt deneis SI/HI/AH/VH.  Pt laying in bed reading a book.  Pt reports " want to sleep now been up a while and i am sleepy."

## 2013-04-19 NOTE — ED Notes (Signed)
Pt brought in by police from [facility] for hallucinations visual and auditory. Pt says she is seeing ghost and hearing voices telling her to kill people that were in the [place] with her bc they dont deserve to live. Pt saw one of the pt's neck cut open like she had cut them but then when she looked again it wasn't there anymore.

## 2013-04-19 NOTE — ED Provider Notes (Signed)
CSN: [id number]     Arrival date & time 04/19/13  1614 History   First MD Initiated Contact with Patient 04/19/13 1713     Chief Complaint  Patient presents with  . Medical Clearance   (Consider location/radiation/quality/duration/timing/severity/associated sxs/prior Treatment) The history is provided by the patient and medical records. No language interpreter was used.    Khandi FANY CAVANAUGH is a 17 y.o. female  with a hx of mild intellectual disability, bipolar disorder, chronic anxiety, borderline diabetes, chronic depression, and chronic mild suicidal ideation without plan, presents to the Emergency Department complaining of acute, persistent, auditory and visual hallucinations onset around noon today.  Patient denies known trigger for these events. She denies suicidal ideations at this time. She reports the voices in her head to her to kill people that are around her. She reports the voices also tell her that these people don't deserve to live. She states she is seeing dead people sometimes hanging and some of them with her next cut open.  She reports this happened one time before the age of 43 has not recurred since.  Nothing seems to make these hallucinations that are worse. She reports she is taking her medications as directed.  She denies also manic symptoms.  She also denies tobacco, alcohol and street drug abuse.  Past Medical History  Diagnosis Date  . Bipolar 1 disorder   . Gluten free diet   . ADHD (attention deficit hyperactivity disorder)   . PTSD (post-traumatic stress disorder)   . 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 Mood disorder   . Diabetes mellitus   . Obesity   . Depression   . Hypothyroidism    Past Surgical History  Procedure Laterality Date  . None     No family history on file. History  Substance Use Topics  . Smoking status: Never Smoker   . Smokeless tobacco: Never Used  . Alcohol Use: No   OB History   Grav Para Term Preterm Abortions TAB SAB Ect  Mult Living                 Review of Systems  Constitutional: Negative for fever, diaphoresis, appetite change, fatigue and unexpected weight change.  HENT: Negative for mouth sores.   Eyes: Negative for visual disturbance.  Respiratory: Negative for cough, chest tightness, shortness of breath and wheezing.   Cardiovascular: Negative for chest pain.  Gastrointestinal: Negative for nausea, vomiting, abdominal pain, diarrhea and constipation.  Endocrine: Negative for polydipsia, polyphagia and polyuria.  Genitourinary: Negative for dysuria, urgency, frequency and hematuria.  Musculoskeletal: Negative for back pain and neck stiffness.  Skin: Negative for rash.  Allergic/Immunologic: Negative for immunocompromised state.  Neurological: Negative for syncope, light-headedness and headaches.  Hematological: Does not bruise/bleed easily.  Psychiatric/Behavioral: Positive for hallucinations. Negative for suicidal ideas and sleep disturbance. The patient is not nervous/anxious.     Allergies  Lactose intolerance (gi)  Home Medications   Current Outpatient Rx  Name  Route  Sig  Dispense  Refill  . carbamazepine (TEGRETOL) 200 MG tablet   Oral   Take 1 tablet (200 mg total) by mouth 2 (two) times daily.   60 tablet   0   . FLUoxetine (PROZAC) 20 MG capsule   Oral   Take 20 mg by mouth every morning.         Marland Kitchen levothyroxine (SYNTHROID, LEVOTHROID) 100 MCG tablet   Oral   Take 100 mcg by mouth daily before breakfast.          .  lithium carbonate (ESKALITH) 450 MG CR tablet   Oral   Take 450-900 mg by mouth 2 (two) times daily. Takes 1 tablet in morning and 2 tablets in evening         . LORazepam (ATIVAN) 1 MG tablet   Oral   Take 1 mg by mouth every 8 (eight) hours as needed for anxiety.         . metFORMIN (GLUCOPHAGE) 500 MG tablet   Oral   Take 500-1,000 mg by mouth 2 (two) times daily with a meal. 500 mg in the morning and 1000 mg at bedtime         .  OLANZapine zydis (ZYPREXA) 10 MG disintegrating tablet   Oral   Take 10 mg by mouth daily as needed (behavior).         Marland Kitchen omeprazole (PRILOSEC) 20 MG capsule   Oral   Take 20 mg by mouth every morning.          . polycarbophil (FIBERCON) 625 MG tablet   Oral   Take 625 mg by mouth daily.         . traZODone (DESYREL) 100 MG tablet   Oral   Take 100 mg by mouth at bedtime.         . medroxyPROGESTERone (DEPO-PROVERA) 150 MG/ML injection   Intramuscular   Inject 150 mg into the muscle every 3 (three) months.          BP 115/68  Pulse 84  Temp(Src) 98.3 F (36.8 C) (Oral)  Resp 17  SpO2 100% Physical Exam  Nursing note and vitals reviewed. Constitutional: She appears well-developed and well-nourished. No distress.  Awake, alert, nontoxic appearance  HENT:  Head: Normocephalic and atraumatic.  Mouth/Throat: Oropharynx is clear and moist. No oropharyngeal exudate.  Eyes: Conjunctivae are normal. No scleral icterus.  Neck: Normal range of motion. Neck supple.  Cardiovascular: Normal rate, regular rhythm, normal heart sounds and intact distal pulses.   No murmur heard. Pulmonary/Chest: Effort normal and breath sounds normal. No respiratory distress. She has no wheezes.  Abdominal: Soft. Bowel sounds are normal. She exhibits no distension and no mass. There is no tenderness. There is no rebound and no guarding.  Musculoskeletal: Normal range of motion. She exhibits no edema.  Neurological: She is alert.  Speech is clear and goal oriented Moves extremities without ataxia  Skin: Skin is warm and dry. She is not diaphoretic.  Psychiatric: She has a normal mood and affect. She is actively hallucinating. She expresses no suicidal ideation. She expresses no suicidal plans and no homicidal plans.  Patient able to carry on thoughtful and coherent conversation; she reports continued hallucinations even during conversation    ED Course  Procedures (including critical care  time) Labs Review Labs Reviewed  CBC - Abnormal; Notable for the following:    HCT 35.9 (*)    All other components within normal limits  COMPREHENSIVE METABOLIC PANEL - Abnormal; Notable for the following:    Sodium 133 (*)    Potassium 3.3 (*)    Total Bilirubin 0.1 (*)    All other components within normal limits  SALICYLATE LEVEL - Abnormal; Notable for the following:    Salicylate Lvl <2.0 (*)    All other components within normal limits  ACETAMINOPHEN LEVEL  ETHANOL  URINE RAPID DRUG SCREEN (HOSP PERFORMED)  POCT PREGNANCY, URINE   Imaging Review No results found.  EKG Interpretation   None       MDM  1. Psychosis   2. Hallucinations   3. Bipolar 1 disorder   4. PTSD (post-traumatic stress disorder)   5. Mental retardation      Fatemah N Ishman presents with active auditory and visual hallucinations.  She has an intermittent history of this with only one reported previous episode. Patient reports taking her medications as directed.  Labs pending. We'll consult [facility] for further evaluation.  8:57 PM. Pt labs resulted; mild hypokalemia is only abnormality and UDS is clean.  Potassium repleated.    [facility] assessment: Disposition: Unable to locate Dr. Silverio Lay prior to assessment. After assessment, spoke with [facility] Dr Laury Deep, who recommends inpt treatment. Also spoke with Dr Silverio Lay after assessment and he is in agreement with this plan.  Pt is medically cleared for inpatient treatment.     Dahlia Client Tereso Unangst, PA-C 04/19/13 2059

## 2013-04-20 DIAGNOSIS — F29 Unspecified psychosis not due to a substance or known physiological condition: Secondary | ICD-10-CM

## 2013-04-20 DIAGNOSIS — F319 Bipolar disorder, unspecified: Secondary | ICD-10-CM

## 2013-04-20 NOTE — ED Notes (Signed)
Pt is calm and pleasant this AM. Pt up watching TV asking for something to drink. When talking with the pt she denies SI//HI. When asked is AVH she only states Im doing better than yesterday.

## 2013-04-20 NOTE — ED Notes (Signed)
Pt calm and cooperative. Pt alert and reading a book in her bed. Pt taking medications without difficulty.

## 2013-04-20 NOTE — ED Notes (Signed)
MD at bedside. 
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
## 2013-04-20 NOTE — ED Provider Notes (Signed)
Psych cleared patient. Stable for d/c. Outpatient f/u.   Richardean Canal, MD 04/20/13 [phone]

## 2013-04-20 NOTE — Progress Notes (Addendum)
CSW attempted to contact pt's family care home to coordinate d/c.   CSW called care home  [phone]) [phone] xs. Was unable to leave message.  CSW LM for Violet Baldy, head of care home, at [phone].    York Spaniel [place], [phone]     ED CSW  10:31am ___________  CSW called DSS to request assistance in contacting group home. CSW spoke with Knox Saliva, who is paging on-call DSS worker. CSW to await call from on-call worker.  York Spaniel [place], [phone]     ED CSW  10:50am _____________  CSW spoke with Jesusita Oka, DSS worker. Jesusita Oka stated pt is under guardianship of [employer], and that [employer] provides her care. Dan provided additional contacts for [facility]--Crystal Lindley [place] [phone] and [phone]). Jesusita Oka provided his direct cell, [phone], if further assistance is needed.  York Spaniel [place], [phone]     ED CSW  11:15am  ___________  CSW spoke with Ronnie Doss to discuss disposition. Crystal stated she would contact Violet Baldy to pick up pt and that she would have [place] call CSW. Per Crystal, Dr. Tamela Oddi at [facility] is pt's psychiatrist--CSW passed this information along to psych NP, per her request.  CSW to await call from [place].  York Spaniel [place], [phone]     ED CSW  11:28am ___________  CSW LM for Violet Baldy again.   York Spaniel [place], [phone]     ED CSW  12:09pm ____________  Violet Baldy called CSW back. States she is currently in [place], but is coming back right now to pick up pt. [name] estimates she will be able to pick pt up around 4pm. RN aware.  York Spaniel [place], [phone]     ED CSW  1:16pm

## 2013-10-24 ENCOUNTER — Encounter (HOSPITAL_COMMUNITY): Payer: Self-pay | Admitting: Emergency Medicine

## 2013-10-24 ENCOUNTER — Emergency Department (HOSPITAL_COMMUNITY)
Admission: EM | Admit: 2013-10-24 | Discharge: 2013-10-24 | Disposition: A | Discharge status: Home / Self Care | Payer: Federal, State, Local not specified - Other | Attending: Emergency Medicine | Admitting: Emergency Medicine

## 2013-10-24 DIAGNOSIS — E119 Type 2 diabetes mellitus without complications: Secondary | ICD-10-CM | POA: Insufficient documentation

## 2013-10-24 DIAGNOSIS — Z7289 Other problems related to lifestyle: Secondary | ICD-10-CM

## 2013-10-24 DIAGNOSIS — IMO0002 Reserved for concepts with insufficient information to code with codable children: Secondary | ICD-10-CM | POA: Insufficient documentation

## 2013-10-24 DIAGNOSIS — E039 Hypothyroidism, unspecified: Secondary | ICD-10-CM | POA: Insufficient documentation

## 2013-10-24 DIAGNOSIS — F319 Bipolar disorder, unspecified: Secondary | ICD-10-CM | POA: Insufficient documentation

## 2013-10-24 DIAGNOSIS — F79 Unspecified intellectual disabilities: Secondary | ICD-10-CM | POA: Insufficient documentation

## 2013-10-24 DIAGNOSIS — F4310 Post-traumatic stress disorder, unspecified: Secondary | ICD-10-CM | POA: Insufficient documentation

## 2013-10-24 DIAGNOSIS — X838XXA Intentional self-harm by other specified means, initial encounter: Secondary | ICD-10-CM | POA: Insufficient documentation

## 2013-10-24 DIAGNOSIS — F329 Major depressive disorder, single episode, unspecified: Secondary | ICD-10-CM | POA: Insufficient documentation

## 2013-10-24 DIAGNOSIS — Z79899 Other long term (current) drug therapy: Secondary | ICD-10-CM | POA: Insufficient documentation

## 2013-10-24 DIAGNOSIS — F39 Unspecified mood [affective] disorder: Secondary | ICD-10-CM | POA: Insufficient documentation

## 2013-10-24 DIAGNOSIS — E669 Obesity, unspecified: Secondary | ICD-10-CM | POA: Insufficient documentation

## 2013-10-24 DIAGNOSIS — Z789 Other specified health status: Secondary | ICD-10-CM | POA: Insufficient documentation

## 2013-10-24 DIAGNOSIS — F3289 Other specified depressive episodes: Secondary | ICD-10-CM | POA: Insufficient documentation

## 2013-10-24 DIAGNOSIS — F909 Attention-deficit hyperactivity disorder, unspecified type: Secondary | ICD-10-CM | POA: Insufficient documentation

## 2013-10-24 LAB — SALICYLATE LEVEL: Salicylate Lvl: <2 mg/dL — ABNORMAL LOW (ref 2.8–20.0)

## 2013-10-24 LAB — RAPID URINE DRUG SCREEN, HOSP PERFORMED
Amphetamines: NOT DETECTED
Barbiturates: NOT DETECTED
Benzodiazepines: NOT DETECTED
Cocaine: NOT DETECTED
Opiates: NOT DETECTED
Tetrahydrocannabinol: NOT DETECTED

## 2013-10-24 LAB — CBC
HCT: 35.1 % — ABNORMAL LOW (ref 36.0–46.0)
Hemoglobin: 11.6 g/dL — ABNORMAL LOW (ref 12.0–15.0)
MCH: 31.5 pg (ref 26.0–34.0)
MCHC: 33 g/dL (ref 30.0–36.0)
MCV: 95.4 fL (ref 78.0–100.0)
Platelets: 380 10*3/uL (ref 150–400)
RBC: 3.68 MIL/uL — ABNORMAL LOW (ref 3.87–5.11)
RDW: 12.6 % (ref 11.5–15.5)
WBC: 9.8 10*3/uL (ref 4.0–10.5)

## 2013-10-24 LAB — COMPREHENSIVE METABOLIC PANEL
ALT: <5 U/L (ref 0–35)
AST: 12 U/L (ref 0–37)
Albumin: 3.4 g/dL — ABNORMAL LOW (ref 3.5–5.2)
Alkaline Phosphatase: 109 U/L (ref 39–117)
BUN: 13 mg/dL (ref 6–23)
CO2: 24 mEq/L (ref 19–32)
Calcium: 9.2 mg/dL (ref 8.4–10.5)
Chloride: 107 mEq/L (ref 96–112)
Creatinine, Ser: 0.68 mg/dL (ref 0.50–1.10)
GFR calc Af Amer: >90 mL/min (ref >90)
GFR calc non Af Amer: >90 mL/min (ref >90)
Glucose, Bld: 113 mg/dL — ABNORMAL HIGH (ref 70–99)
Potassium: 3.4 mEq/L — ABNORMAL LOW (ref 3.7–5.3)
Sodium: 143 mEq/L (ref 137–147)
Total Bilirubin: <0.2 mg/dL — ABNORMAL LOW (ref 0.3–1.2)
Total Protein: 7.2 g/dL (ref 6.0–8.3)

## 2013-10-24 LAB — ETHANOL: Alcohol, Ethyl (B): <11 mg/dL (ref 0–11)

## 2013-10-24 LAB — LITHIUM LEVEL: Lithium Lvl: 0.35 mEq/L — ABNORMAL LOW (ref 0.80–1.40)

## 2013-10-24 LAB — ACETAMINOPHEN LEVEL: Acetaminophen (Tylenol), Serum: <15 ug/mL (ref 10–30)

## 2013-10-24 MED ORDER — IBUPROFEN 200 MG PO TABS: 600.0000 mg | ORAL_TABLET | Freq: Three times a day (TID) | ORAL | Status: DC | PRN

## 2013-10-24 MED ORDER — LORAZEPAM 1 MG PO TABS: 1.0000 mg | ORAL_TABLET | Freq: Three times a day (TID) | ORAL | Status: DC | PRN

## 2013-10-24 MED ORDER — ALUM & MAG HYDROXIDE-SIMETH 200-200-20 MG/5ML PO SUSP: 30.0000 mL | ORAL | Status: DC | PRN

## 2013-10-24 MED ORDER — LITHIUM CARBONATE ER 450 MG PO TBCR: 450.0000 mg | EXTENDED_RELEASE_TABLET | Freq: Every morning | ORAL | Status: DC

## 2013-10-24 MED ORDER — ZOLPIDEM TARTRATE 5 MG PO TABS: 5.0000 mg | ORAL_TABLET | Freq: Every evening | ORAL | Status: DC | PRN

## 2013-10-24 MED ORDER — CALCIUM POLYCARBOPHIL 625 MG PO TABS: 625.0000 mg | ORAL_TABLET | Freq: Every day | ORAL | Status: DC

## 2013-10-24 MED ORDER — METFORMIN HCL 500 MG PO TABS: 500.0000 mg | ORAL_TABLET | Freq: Two times a day (BID) | ORAL | Status: DC

## 2013-10-24 MED ORDER — OLANZAPINE 10 MG PO TBDP: 10.0000 mg | ORAL_TABLET | Freq: Every day | ORAL | Status: DC | PRN

## 2013-10-24 MED ORDER — NICOTINE 21 MG/24HR TD PT24: 21.0000 mg | MEDICATED_PATCH | Freq: Every day | TRANSDERMAL | Status: DC

## 2013-10-24 MED ORDER — TRAZODONE HCL 100 MG PO TABS
100.0000 mg | ORAL_TABLET | Freq: Every day | ORAL | Status: DC
  Administered 2013-10-24: 100 mg via ORAL
  Filled 2013-10-24: qty 1

## 2013-10-24 MED ORDER — LEVOTHYROXINE SODIUM 100 MCG PO TABS
100.0000 ug | ORAL_TABLET | Freq: Every day | ORAL | Status: DC
  Filled 2013-10-24: qty 1

## 2013-10-24 MED ORDER — CARBAMAZEPINE 200 MG PO TABS: 200.0000 mg | ORAL_TABLET | Freq: Two times a day (BID) | ORAL | Status: DC

## 2013-10-24 MED ORDER — METFORMIN HCL 500 MG PO TABS
500.0000 mg | ORAL_TABLET | Freq: Two times a day (BID) | ORAL | Status: DC
  Filled 2013-10-24: qty 1

## 2013-10-24 MED ORDER — CARBAMAZEPINE ER 200 MG PO TB12
200.0000 mg | ORAL_TABLET | ORAL | Status: DC
  Filled 2013-10-24 (×2): qty 1

## 2013-10-24 MED ORDER — LITHIUM CARBONATE ER 450 MG PO TBCR
900.0000 mg | EXTENDED_RELEASE_TABLET | Freq: Every evening | ORAL | Status: DC
  Administered 2013-10-24: 900 mg via ORAL
  Filled 2013-10-24: qty 2

## 2013-10-24 MED ORDER — LITHIUM CARBONATE ER 450 MG PO TBCR: 450.0000 mg | EXTENDED_RELEASE_TABLET | Freq: Two times a day (BID) | ORAL | Status: DC

## 2013-10-24 MED ORDER — PANTOPRAZOLE SODIUM 40 MG PO TBEC
40.0000 mg | DELAYED_RELEASE_TABLET | Freq: Every day | ORAL | Status: DC
Start: 2013-10-24 — End: 2013-10-25
  Administered 2013-10-24: 40 mg via ORAL
  Filled 2013-10-24: qty 1

## 2013-10-24 MED ORDER — ONDANSETRON HCL 4 MG PO TABS: 4.0000 mg | ORAL_TABLET | Freq: Three times a day (TID) | ORAL | Status: DC | PRN

## 2013-10-24 MED ORDER — FLUOXETINE HCL 20 MG PO CAPS: 20.0000 mg | ORAL_CAPSULE | Freq: Every morning | ORAL | Status: DC

## 2013-10-24 NOTE — ED Notes (Signed)
Pt and belongings have been wanded, belongings at triage desk. 

## 2013-10-24 NOTE — ED Notes (Addendum)
Contacted provider with medication clarification. Patient states fiber is prn and metformin is 500mg  bid.

## 2013-10-24 NOTE — ED Notes (Signed)
Patient states she "got upset" at day program and made superficial cuts to left arm. Denies Suicide plan and contracts for safety.

## 2013-10-24 NOTE — BH Assessment (Addendum)
Assessment Note  Holly [name] is an 18 y.o. female. Patient was brought into the ED by [facility] from [facility] after she made several cuts to her wrist with a piece of glass and walked out in traffic.  Patient curently denies SI/HI and halluciantions.  Pateint reports that she was mad at a staff member earlier for saying mean things to her so she made some cuts to her wrist to relieve the stress but not to kill her self.  Patient had other old superficial cuts to her forearm that she reports from last month when she was upset about other conflicts.  Patient reports that she is compliant with her mental health appointment and medications as her AFL provider ensures of it.   CSW spoke with the [place] Education officer, museum with [place] to collect collateral information. She reports that the patient been in their crisis center multiple times for similar behavioral outburst.  She reports when the patient do not get her way she reacts with cutting behaviors and requesting hospitalizations.  She reports that the patient is also on a very strict gluten-free diet and request to go to specificly "[facility]" so she can eat the food.  She reports that the patient has a diagnosis of MMR and a IQ53.  She reports that the patient has had multiple admission the [place] but do not like those emergency departments.  Patient has had a history of suicide attempts but none recently only superficial cutting.   CSW spoke with the patient's AFL provided Olevia Perches for collateral information.  She reports that the patient like to go to the hospital and she requested to go [facility] today.  She reports that earlier today the patient requested to go into the [facility] but when denied she took a rock to scratch her wrist. When the patient was given a first aide kit to clean herself up the patient ran out of the building found a piece of glass to make more superficial cuts to her wrist to go to [facility] ED.  She reports that the patient loves the [facility] ED.  She confirms that the patient is motivated by food and breaking her diet.  She said that the patient is safe at home and can return.   Patient currently lives with Olevia Perches [phone] AFL provider in [facility], [place] for the last one and half years. Her guardian is DSS worker [name] [phone].   Patient is currently participating in a day program with [facility] in [place], [place].  She is receiving therapy with [facility] in [place], [place] with [employer]. The AFL provider reports that the patient last saw the therapist two weeks ago and has scheduled monthly team meetings.  Patient medication management provider is Dr. Jobie Quaker with [facility].  The AFL provider reports that the patient last appointment with the doctor was on June 1st and next appointment is June 24th.     CSW ran the patient with Manus Gunning, NP and Dr. Doy Mince it recommeded to refer to outpatient appointment on June 24th with Dr. Jobie Quaker with [facility] and follow up with other providers on next business day.   Axis I: Bipolar, Depressed Axis II: MIMR (IQ = approx. 50-70) Axis III:  Past Medical History  Diagnosis Date  . Bipolar 1 disorder   . Gluten free diet   . ADHD (attention deficit hyperactivity disorder)   .  PTSD (post-traumatic stress disorder)   . 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 Mood disorder   . Diabetes mellitus   . Obesity   . Depression   . Hypothyroidism    Axis IV: educational problems, other psychosocial or environmental problems, problems related to legal system/crime, problems related to social environment and problems with primary support group Axis V: 51-60 moderate symptoms  Past Medical History:  Past Medical History  Diagnosis Date  . Bipolar 1 disorder   . Gluten free diet   . ADHD (attention deficit hyperactivity disorder)   . PTSD  (post-traumatic stress disorder)   . 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 Mood disorder   . Diabetes mellitus   . Obesity   . Depression   . Hypothyroidism     Past Surgical History  Procedure Laterality Date  . None      Family History: No family history on file.  Social History:  reports that she has never smoked. She has never used smokeless tobacco. She reports that she does not drink alcohol or use illicit drugs.  Additional Social History:     CIWA: CIWA-Ar BP: 119/68 mmHg Pulse Rate: 83 COWS:    Allergies:  Allergies  Allergen Reactions  . Lactose Intolerance (Gi) Nausea Only and Other (See Comments)    Upset stomach    Home Medications:  (Not in a hospital admission)  OB/GYN Status:  No LMP recorded. Patient has had an injection.  General Assessment Data Location of Assessment: [facility] ACT Assessment: Yes Is this a Tele or Face-to-Face Assessment?: Face-to-Face Is this an Initial Assessment or a Re-assessment for this encounter?: Initial Assessment Living Arrangements: Non-relatives/Friends (AFL provider) Can pt return to current living arrangement?: Yes Admission Status: Voluntary Is patient capable of signing voluntary admission?: No Transfer from: Other (Comment) [facility]) Referral Source: Other Consulting civil engineer)  Medical Screening Exam ([place]) Medical Exam completed: Yes  [facility] Crisis Care Plan Living Arrangements: Non-relatives/Friends (AFL provider) Name of Psychiatrist: Dr Wille Glaser     Risk to self Suicidal Ideation: No-Not Currently/Within Last 6 Months Suicidal Intent: No-Not Currently/Within Last 6 Months Is patient at risk for suicide?: No Suicidal Plan?: No-Not Currently/Within Last 6 Months Access to Means: No What has been your use of drugs/alcohol within the last 12 months?: none Previous Attempts/Gestures: Yes How many times?: 2 Other Self Harm Risks: cutting Triggers for Past Attempts: Unpredictable;Other (Comment)  (anger) Intentional Self Injurious Behavior: Cutting Comment - Self Injurious Behavior: cutting Family Suicide History: Unknown Recent stressful life event(s): Conflict (Comment) Persecutory voices/beliefs?: No Depression: Yes Depression Symptoms: Feeling angry/irritable Substance abuse history and/or treatment for substance abuse?: No  Risk to Others Homicidal Ideation: No-Not Currently/Within Last 6 Months Thoughts of Harm to Others: No-Not Currently Present/Within Last 6 Months Current Homicidal Intent: No-Not Currently/Within Last 6 Months Current Homicidal Plan: No-Not Currently/Within Last 6 Months Access to Homicidal Means: No History of harm to others?: No (pt denies) Assessment of Violence: None Noted Does patient have access to weapons?: No Criminal Charges Pending?: No Does patient have a court date: No  Psychosis Hallucinations: None noted Delusions: None noted  Mental Status Report Appear/Hygiene: In hospital gown Eye Contact: Fair Motor Activity: Freedom of movement Speech: Slurred Level of Consciousness: Alert Anxiety Level: None Thought Processes: Relevant Judgement: Impaired Orientation: Person;Place;Time;Situation  Cognitive Functioning Concentration: Fair Memory: Recent Intact;Remote Intact IQ: Below Average (Per [facility] SW IQ 68) Insight: see judgement above Impulse Control: Poor Appetite:  Good Sleep: No Change Vegetative Symptoms: None  ADLScreening [facility] Assessment Services) Patient's cognitive ability adequate to safely complete daily activities?: Yes Patient able to express need for assistance with ADLs?: Yes Independently performs ADLs?: Yes (appropriate for developmental age)  Prior Inpatient Therapy Prior Inpatient Therapy: Yes Prior Therapy Facilty/Provider(s): [facility], [place], [place], [place], [facility] Reason for Treatment: SI, MH, Behavioral  Prior Outpatient Therapy Prior Outpatient Therapy: Yes Prior Therapy  Facilty/Provider(s): [facility], [facility], [facility]  ADL Screening (condition at time of admission) Patient's cognitive ability adequate to safely complete daily activities?: Yes Patient able to express need for assistance with ADLs?: Yes Independently performs ADLs?: Yes (appropriate for developmental age)         Values / Beliefs Cultural Requests During Hospitalization: None Spiritual Requests During Hospitalization: None        Additional Information 1:1 In Past 12 Months?: No CIRT Risk: No Elopement Risk: No Does patient have medical clearance?: Yes     Disposition:  Disposition Initial Assessment Completed for this Encounter: Yes Disposition of Patient: Outpatient treatment Type of outpatient treatment: Adult  On Site Evaluation by:   Reviewed with Physician:    Chesley Noon A 10/24/2013 7:39 PM

## 2013-10-24 NOTE — Discharge Instructions (Signed)
No-harm Safety Contract  °A no-harm safety contract is a written or verbal agreement between you and a mental health professional to promote safety. It contains specific actions and promises you agree to. The agreement also includes instructions from the therapist or doctor. The instructions will help prevent you from harming yourself or harming others. Harm can be as mild as pinching yourself, but can increase in intensity to actions like burning or cutting yourself. The extreme level of self-harm would be committing suicide. No-harm safety contracts are also sometimes referred to as a no-suicide contract, suicide prevention contract, no-harm agreements or decisions, or a safety contract.  °REASONS FOR NO-HARM SAFETY CONTRACTS °Safety contracts are just one part of an overall treatment plan to help keep you safe and free of harm. A safety contract may help to relieve anxiety, restore a sense of control, state clearly the alternatives to harm or suicide, and give you and your therapist or doctor a gauge for how you are doing in between visits. °Many factors impact the decision to use a no-harm safety contract and its effectiveness. A proper overall treatment plan and evaluation and good patient understanding are the keys to good outcomes. °CONTRACT ELEMENTS  °A contract can range from simple to complex. They include all or some of the following:  °Action statements. These are statements you agree to do or not do. °Example: If I feel my life is becoming too difficult, I agree to do the following so there is no harm to myself or others: °· Talk with family or friends. °· Rid myself of all things that I could use to harm myself. °· Do an activity I enjoy or have enjoyed in the recent past. °Coping strategies. These are ways to think and feel that decrease stress, such as: °· Use of affirmations or positive statements about self. °· Good self-care, including improved grooming, and healthy eating, and healthy sleeping  patterns. °· Increase physical exercise. °· Increase social involvement. °· Focus on positive aspects of life. °Crisis management. This would include what to do if there was trouble following the contract or an urge to harm. This might include notifying family or your therapist of suicidal thoughts. Be open and honest about suicidal urges. To prevent a crisis, do the following: °· List reasons to reach out for support. °· Keep contact numbers and available hours handy. °Treatment goals. These are goals would include no suicidal thoughts, improved mood, and feelings of hopefulness. °Listed responsibilities of different people involved in care. This could include family members. A family member may agree to remove firearms or other lethal weapons/substances from your ease of access. °A timeline. A timeline can be in place from one therapy session to the next session. °HOME CARE INSTRUCTIONS  °· Follow your no-harm safety contract. °· Contact your therapist and/or doctor if you have any questions or concerns. °MAKE SURE YOU:  °· Understand these instructions. °· Will watch your condition. Noticing any mood changes or suicidal urges. °· Will get help right away if you are not doing well or get worse. °Document Released: 10/13/2009 Document Revised: 07/18/2011 Document Reviewed: 10/13/2009 °ExitCare® Patient Information ©2015 [employer]. This information is not intended to replace advice given to you by your health care provider. Make sure you discuss any questions you have with your health care provider. ° °

## 2013-10-24 NOTE — ED Notes (Addendum)
Patient pleasant and cooperative. Patient denies SI, HI at present. Denies feelings of anxiety and depression at this time. Contracts for safety on the unit. States that she only has problems with her day program. She is comfortable at her group home.  Encouragement offered.  Safety maintained, Q 15 checks continue.

## 2013-10-24 NOTE — ED Notes (Addendum)
[facility] brought [facility] by [facility]. [facility] due to pt on [facility] free diet.   Pt was at day program and pt made several superficial cuts to her left inner arm area.  Pt stated that staff at day program was talking mean to her and that triggered her suicidal thoughts. And [facility]

## 2013-10-24 NOTE — ED Notes (Signed)
Call to plebotomy to requests ordered lab.

## 2013-10-24 NOTE — ED Provider Notes (Signed)
CSN: [id number]     Arrival date & time 10/24/13  1458 History  This chart was scribed for non-physician practitioner, Dierdre ForthHannah Muthersbaugh, PA-C,working with Raeford RazorStephen Kohut, MD, by Karle PlumberJennifer Tensley, ED Scribe.  This patient was seen in room WTR5/WTR5 and the patient's care was started at 3:35 PM.  Chief Complaint  Patient presents with  . suicidal    The history is provided by the patient. No language interpreter was used.   HPI Comments:  Holly [name] is a 18 y.o. female brought [facility] by [facility] needing medical clearance for placement. [facility] because she is on a gluten-free diet. She states she was at a day program earlier today and states that the lady there told her she was going crazy. She states she then walked out in front of traffic, then picked up a piece of glass and began to cut her left wrist. She states she has attempted suicide before by drinking bleach years ago. Pt reports her current plan of suicide was to continue to cut herself. She denies HI. She reports h/o ADHD, bipolar, schizophrenia, and depression.  Past Medical History  Diagnosis Date  . Bipolar 1 disorder   . Gluten free diet   . ADHD (attention deficit hyperactivity disorder)   . PTSD (post-traumatic stress disorder)   . 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 Mood disorder   . Diabetes mellitus   . Obesity   . Depression   . Hypothyroidism    Past Surgical History  Procedure Laterality Date  . None     No family history on file. History  Substance Use Topics  . Smoking status: Never Smoker   . Smokeless tobacco: Never Used  . Alcohol Use: No   OB History   Grav Para Term Preterm Abortions TAB SAB Ect Mult Living                 Review of Systems  Skin: Positive for wound (superficial lacerations to left forearm).  Psychiatric/Behavioral: Positive for suicidal ideas and self-injury. Negative for hallucinations.  All other  systems reviewed and are negative.   Allergies  Lactose intolerance (gi)  Home Medications   Prior to Admission medications   Medication Sig Start Date End Date Taking? Authorizing Provider  carbamazepine (TEGRETOL) 200 MG tablet Take 1 tablet (200 mg total) by mouth 2 (two) times daily. 11/05/12   Dione Boozeavid Glick, MD  FLUoxetine (PROZAC) 20 MG capsule Take 20 mg by mouth every morning.    Historical Provider, MD  levothyroxine (SYNTHROID, LEVOTHROID) 100 MCG tablet Take 100 mcg by mouth daily before breakfast.     Historical Provider, MD  lithium carbonate (ESKALITH) 450 MG CR tablet Take 450-900 mg by mouth 2 (two) times daily. Takes 1 tablet in morning and 2 tablets in evening 10/19/11   Lyanne CoKevin M Campos, MD  LORazepam (ATIVAN) 1 MG tablet Take 1 mg by mouth every 8 (eight) hours as needed for anxiety.    Historical Provider, MD  medroxyPROGESTERone (DEPO-PROVERA) 150 MG/ML injection Inject 150 mg into the muscle every 3 (three) months.    Historical Provider, MD  metFORMIN (GLUCOPHAGE) 500 MG tablet Take 500-1,000 mg by mouth 2 (two) times daily with a meal. 500 mg in the morning and 1000 mg at bedtime    Historical Provider, MD  OLANZapine zydis (ZYPREXA) 10 MG disintegrating tablet Take 10 mg by mouth daily as needed (behavior).  Historical Provider, MD  omeprazole (PRILOSEC) 20 MG capsule Take 20 mg by mouth every morning.     Historical Provider, MD  polycarbophil (FIBERCON) 625 MG tablet Take 625 mg by mouth daily.    Historical Provider, MD  traZODone (DESYREL) 100 MG tablet Take 100 mg by mouth at bedtime.    Historical Provider, MD   Triage Vitals: BP 119/68  Pulse 83  Temp(Src) 98.3 F (36.8 C) (Oral)  Resp 18  SpO2 98% Physical Exam  Nursing note and vitals reviewed. Constitutional: She appears well-developed and well-nourished. No distress.  Awake, alert, nontoxic appearance  HENT:  Head: Normocephalic and atraumatic.  Mouth/Throat: Oropharynx is clear and moist. No  oropharyngeal exudate.  Eyes: Conjunctivae are normal. No scleral icterus.  Neck: Normal range of motion. Neck supple.  Cardiovascular: Normal rate, regular rhythm and intact distal pulses.   Pulmonary/Chest: Effort normal and breath sounds normal. No respiratory distress. She has no wheezes.  Abdominal: Soft. Bowel sounds are normal. She exhibits no mass. There is no tenderness. There is no rebound and no guarding.  Musculoskeletal: Normal range of motion. She exhibits no edema.  Neurological: She is alert.  Speech is clear and goal oriented Moves extremities without ataxia  Skin: Skin is warm and dry. She is not diaphoretic.  Numerous, very superficial lacerations to left forearm without evidence of secondary infection.  Psychiatric: She has a normal mood and affect.    ED Course  Procedures (including critical care time) DIAGNOSTIC STUDIES: Oxygen Saturation is 98% on RA, normal by my interpretation.   COORDINATION OF CARE: 3:39 PM- Will order standard medical clearance labs and TTS consult.  Pt verbalizes understanding and agrees to plan.  Medications  LORazepam (ATIVAN) tablet 1 mg (not administered)  ibuprofen (ADVIL,MOTRIN) tablet 600 mg (not administered)  zolpidem (AMBIEN) tablet 5 mg (not administered)  nicotine (NICODERM CQ - dosed in mg/24 hours) patch 21 mg (21 mg Transdermal Not Given 10/24/13 1708)  ondansetron (ZOFRAN) tablet 4 mg (not administered)  alum & mag hydroxide-simeth (MAALOX/MYLANTA) 200-200-20 MG/5ML suspension 30 mL (not administered)  carbamazepine (TEGRETOL) tablet 200 mg (not administered)  FLUoxetine (PROZAC) capsule 20 mg (not administered)  levothyroxine (SYNTHROID, LEVOTHROID) tablet 100 mcg (not administered)  lithium carbonate (ESKALITH) CR tablet 450-900 mg (not administered)  metFORMIN (GLUCOPHAGE) tablet 500-1,000 mg (not administered)  OLANZapine zydis (ZYPREXA) disintegrating tablet 10 mg (not administered)  pantoprazole (PROTONIX) EC  tablet 40 mg (not administered)  polycarbophil (FIBERCON) tablet 625 mg (not administered)  traZODone (DESYREL) tablet 100 mg (not administered)    Labs Review Labs Reviewed  URINE RAPID DRUG SCREEN (HOSP PERFORMED)  ACETAMINOPHEN LEVEL  CBC  COMPREHENSIVE METABOLIC PANEL  ETHANOL  SALICYLATE LEVEL  POC URINE PREG, ED    Imaging Review No results found.   EKG Interpretation None      MDM   Final diagnoses:  Suicidal ideation  Bipolar 1 disorder  PTSD (post-traumatic stress disorder)   [name] from [facility] with [facility] ideations. Patient lives at a group home and ran out into traffic today and has superficial lacerations to the left arm. She reports that she wants to kill herself.  Patient initially evaluated a [facility] but was sent here because she is on a gluten-free diet; however she is eating Malaw[place]key sandwiches and crackers here in the emergency department without difficulty.  Labs pending the patient otherwise medically cleared for assessment by TTS.  BP 119/68  Pulse 83  Temp(Src) 98.3 F (36.8 C) (Oral)  Resp 18  SpO2 98%  I personally performed the services described in this documentation, which was scribed in my presence. The recorded information has been reviewed and is accurate.    Dahlia Client Muthersbaugh, PA-C 10/24/13 1838

## 2013-10-25 NOTE — ED Provider Notes (Signed)
Medical screening examination/treatment/procedure(s) were performed by non-physician practitioner and as supervising physician I was immediately available for consultation/collaboration.   EKG Interpretation None       Eleanor Gatliff, MD 10/25/13 0802 

## 2013-12-13 ENCOUNTER — Emergency Department (HOSPITAL_COMMUNITY): Payer: Medicaid Other

## 2013-12-13 ENCOUNTER — Emergency Department (HOSPITAL_COMMUNITY)
Admission: EM | Admit: 2013-12-13 | Discharge: 2013-12-14 | Disposition: A | Discharge status: Home / Self Care | Payer: Medicaid Other | Attending: Emergency Medicine | Admitting: Emergency Medicine

## 2013-12-13 DIAGNOSIS — Z79899 Other long term (current) drug therapy: Secondary | ICD-10-CM | POA: Diagnosis not present

## 2013-12-13 DIAGNOSIS — Z046 Encounter for general psychiatric examination, requested by authority: Secondary | ICD-10-CM | POA: Diagnosis present

## 2013-12-13 DIAGNOSIS — F901 Attention-deficit hyperactivity disorder, predominantly hyperactive type: Secondary | ICD-10-CM

## 2013-12-13 DIAGNOSIS — F909 Attention-deficit hyperactivity disorder, unspecified type: Secondary | ICD-10-CM | POA: Diagnosis present

## 2013-12-13 DIAGNOSIS — E669 Obesity, unspecified: Secondary | ICD-10-CM | POA: Insufficient documentation

## 2013-12-13 DIAGNOSIS — R45851 Suicidal ideations: Secondary | ICD-10-CM

## 2013-12-13 DIAGNOSIS — R443 Hallucinations, unspecified: Secondary | ICD-10-CM | POA: Diagnosis not present

## 2013-12-13 DIAGNOSIS — R440 Auditory hallucinations: Secondary | ICD-10-CM

## 2013-12-13 DIAGNOSIS — E039 Hypothyroidism, unspecified: Secondary | ICD-10-CM | POA: Insufficient documentation

## 2013-12-13 DIAGNOSIS — F4310 Post-traumatic stress disorder, unspecified: Secondary | ICD-10-CM | POA: Diagnosis present

## 2013-12-13 DIAGNOSIS — E119 Type 2 diabetes mellitus without complications: Secondary | ICD-10-CM | POA: Insufficient documentation

## 2013-12-13 DIAGNOSIS — F319 Bipolar disorder, unspecified: Secondary | ICD-10-CM | POA: Insufficient documentation

## 2013-12-13 DIAGNOSIS — F3164 Bipolar disorder, current episode mixed, severe, with psychotic features: Secondary | ICD-10-CM | POA: Diagnosis present

## 2013-12-13 LAB — RAPID URINE DRUG SCREEN, HOSP PERFORMED
Amphetamines: NOT DETECTED
Barbiturates: NOT DETECTED
Benzodiazepines: NOT DETECTED
Cocaine: NOT DETECTED
Opiates: NOT DETECTED
Tetrahydrocannabinol: NOT DETECTED

## 2013-12-13 LAB — SALICYLATE LEVEL: Salicylate Lvl: <2 mg/dL — ABNORMAL LOW (ref 2.8–20.0)

## 2013-12-13 LAB — COMPREHENSIVE METABOLIC PANEL
ALT: 6 U/L (ref 0–35)
AST: 12 U/L (ref 0–37)
Albumin: 4 g/dL (ref 3.5–5.2)
Alkaline Phosphatase: 125 U/L — ABNORMAL HIGH (ref 39–117)
Anion gap: 13 (ref 5–15)
BUN: 8 mg/dL (ref 6–23)
CO2: 22 mEq/L (ref 19–32)
Calcium: 9.7 mg/dL (ref 8.4–10.5)
Chloride: 104 mEq/L (ref 96–112)
Creatinine, Ser: 0.62 mg/dL (ref 0.50–1.10)
GFR calc Af Amer: >90 mL/min (ref >90)
GFR calc non Af Amer: >90 mL/min (ref >90)
Glucose, Bld: 91 mg/dL (ref 70–99)
Potassium: 4.3 mEq/L (ref 3.7–5.3)
Sodium: 139 mEq/L (ref 137–147)
Total Bilirubin: <0.2 mg/dL — ABNORMAL LOW (ref 0.3–1.2)
Total Protein: 8.1 g/dL (ref 6.0–8.3)

## 2013-12-13 LAB — CBC
HCT: 38.4 % (ref 36.0–46.0)
Hemoglobin: 13 g/dL (ref 12.0–15.0)
MCH: 31.8 pg (ref 26.0–34.0)
MCHC: 33.9 g/dL (ref 30.0–36.0)
MCV: 93.9 fL (ref 78.0–100.0)
Platelets: 323 10*3/uL (ref 150–400)
RBC: 4.09 MIL/uL (ref 3.87–5.11)
RDW: 12.4 % (ref 11.5–15.5)
WBC: 8.7 10*3/uL (ref 4.0–10.5)

## 2013-12-13 LAB — ETHANOL: Alcohol, Ethyl (B): <11 mg/dL (ref 0–11)

## 2013-12-13 LAB — LITHIUM LEVEL: Lithium Lvl: 0.61 mEq/L — ABNORMAL LOW (ref 0.80–1.40)

## 2013-12-13 LAB — CBG MONITORING, ED: Glucose-Capillary: 99 mg/dL (ref 70–99)

## 2013-12-13 LAB — ACETAMINOPHEN LEVEL: Acetaminophen (Tylenol), Serum: <15 ug/mL (ref 10–30)

## 2013-12-13 LAB — CARBAMAZEPINE LEVEL, TOTAL: Carbamazepine Lvl: 8.4 ug/mL (ref 4.0–12.0)

## 2013-12-13 MED ORDER — ACETAMINOPHEN 325 MG PO TABS: 650.0000 mg | ORAL_TABLET | ORAL | Status: DC | PRN

## 2013-12-13 MED ORDER — LEVOTHYROXINE SODIUM 100 MCG PO TABS
100.0000 ug | ORAL_TABLET | Freq: Every day | ORAL | Status: DC
  Administered 2013-12-14: 100 ug via ORAL
  Filled 2013-12-13 (×2): qty 1

## 2013-12-13 MED ORDER — FLUOXETINE HCL 20 MG PO CAPS
20.0000 mg | ORAL_CAPSULE | Freq: Every morning | ORAL | Status: DC
  Administered 2013-12-14: 20 mg via ORAL
  Filled 2013-12-13: qty 1

## 2013-12-13 MED ORDER — LITHIUM CARBONATE ER 450 MG PO TBCR
900.0000 mg | EXTENDED_RELEASE_TABLET | Freq: Every day | ORAL | Status: DC
  Administered 2013-12-13: 900 mg via ORAL
  Filled 2013-12-13 (×2): qty 2

## 2013-12-13 MED ORDER — METFORMIN HCL ER 500 MG PO TB24
500.0000 mg | ORAL_TABLET | Freq: Every day | ORAL | Status: DC
  Administered 2013-12-14: 500 mg via ORAL
  Filled 2013-12-13 (×2): qty 1

## 2013-12-13 MED ORDER — NICOTINE 21 MG/24HR TD PT24
21.0000 mg | MEDICATED_PATCH | Freq: Every day | TRANSDERMAL | Status: DC
  Filled 2013-12-13: qty 1

## 2013-12-13 MED ORDER — OLANZAPINE 10 MG PO TBDP: 10.0000 mg | ORAL_TABLET | Freq: Every day | ORAL | Status: DC | PRN

## 2013-12-13 MED ORDER — ZOLPIDEM TARTRATE 5 MG PO TABS
5.0000 mg | ORAL_TABLET | Freq: Every evening | ORAL | Status: DC | PRN
Start: 2013-12-13 — End: 2013-12-14

## 2013-12-13 MED ORDER — OLANZAPINE 5 MG PO TABS: 7.5000 mg | ORAL_TABLET | Freq: Every morning | ORAL | Status: DC

## 2013-12-13 MED ORDER — IBUPROFEN 200 MG PO TABS: 600.0000 mg | ORAL_TABLET | Freq: Three times a day (TID) | ORAL | Status: DC | PRN

## 2013-12-13 MED ORDER — METFORMIN HCL 500 MG PO TABS: 500.0000 mg | ORAL_TABLET | Freq: Two times a day (BID) | ORAL | Status: DC

## 2013-12-13 MED ORDER — ALUM & MAG HYDROXIDE-SIMETH 200-200-20 MG/5ML PO SUSP: 30.0000 mL | ORAL | Status: DC | PRN

## 2013-12-13 MED ORDER — ONDANSETRON HCL 4 MG PO TABS
4.0000 mg | ORAL_TABLET | Freq: Three times a day (TID) | ORAL | Status: DC | PRN
  Administered 2013-12-13: 4 mg via ORAL
  Filled 2013-12-13: qty 1

## 2013-12-13 MED ORDER — CARBAMAZEPINE ER 200 MG PO TB12
200.0000 mg | ORAL_TABLET | ORAL | Status: DC
  Administered 2013-12-13 – 2013-12-14 (×4): 200 mg via ORAL
  Filled 2013-12-13 (×6): qty 1

## 2013-12-13 MED ORDER — LORAZEPAM 1 MG PO TABS: 1.0000 mg | ORAL_TABLET | Freq: Three times a day (TID) | ORAL | Status: DC | PRN

## 2013-12-13 MED ORDER — PANTOPRAZOLE SODIUM 40 MG PO TBEC: 40.0000 mg | DELAYED_RELEASE_TABLET | Freq: Every day | ORAL | Status: DC

## 2013-12-13 MED ORDER — PANTOPRAZOLE SODIUM 40 MG PO TBEC
40.0000 mg | DELAYED_RELEASE_TABLET | Freq: Every day | ORAL | Status: DC
  Administered 2013-12-14: 40 mg via ORAL
  Filled 2013-12-13: qty 1

## 2013-12-13 MED ORDER — LITHIUM CARBONATE ER 450 MG PO TBCR
450.0000 mg | EXTENDED_RELEASE_TABLET | Freq: Every day | ORAL | Status: DC
  Administered 2013-12-14: 450 mg via ORAL
  Filled 2013-12-13: qty 1

## 2013-12-13 MED ORDER — METHYLPHENIDATE HCL ER 10 MG PO TBCR: 27.0000 mg | EXTENDED_RELEASE_TABLET | Freq: Every day | ORAL | Status: DC

## 2013-12-13 MED ORDER — LACTASE 3000 UNITS PO TABS: 3000.0000 [IU] | ORAL_TABLET | Freq: Four times a day (QID) | ORAL | Status: DC | PRN

## 2013-12-13 MED ORDER — METHYLPHENIDATE HCL ER 10 MG PO TBCR: 27.0000 mg | EXTENDED_RELEASE_TABLET | ORAL | Status: DC

## 2013-12-13 NOTE — ED Notes (Signed)
Pt continues to hear voices telling her to hurt self. Superficial cuts noted to Left forearm.  Will continue to monitor for safety.

## 2013-12-13 NOTE — ED Provider Notes (Signed)
CSN: [id number]     Arrival date & time 12/13/13  1217 History   First MD Initiated Contact with Patient 12/13/13 1230     Chief Complaint  Patient presents with  . Suicidal, Auditory Hallucinations      (Consider location/radiation/quality/duration/timing/severity/associated sxs/prior Treatment) Patient is a 18 y.o. female presenting with mental health disorder. The history is provided by the patient.  Mental Health Problem Presenting symptoms: aggressive behavior, suicidal thoughts and suicidal threats   Patient accompanied by:  Law enforcement Degree of incapacity (severity):  Moderate Onset quality:  Gradual Duration:  2 days Timing:  Constant Progression:  Unchanged Chronicity:  New Context: not alcohol use, not drug abuse and not noncompliant (her case workers give her her medications.)   Treatment compliance:  All of the time Relieved by:  Nothing Worsened by:  Nothing tried Associated symptoms: no abdominal pain and no insomnia     Past Medical History  Diagnosis Date  . Bipolar 1 disorder   . Gluten free diet   . ADHD (attention deficit hyperactivity disorder)   . PTSD (post-traumatic stress disorder)   . 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 Mood disorder   . Diabetes mellitus   . Obesity   . Depression   . Hypothyroidism    Past Surgical History  Procedure Laterality Date  . None     No family history on file. History  Substance Use Topics  . Smoking status: Never Smoker   . Smokeless tobacco: Never Used  . Alcohol Use: No   OB History   Grav Para Term Preterm Abortions TAB SAB Ect Mult Living                 Review of Systems  Constitutional: Negative for fever and chills.  Respiratory: Negative for cough and shortness of breath.   Gastrointestinal: Negative for abdominal pain.  Psychiatric/Behavioral: Positive for suicidal ideas. The patient does not have insomnia.   All other systems reviewed and are negative.     Allergies  Lactose  intolerance (gi)  Home Medications   Prior to Admission medications   Medication Sig Start Date End Date Taking? Authorizing Provider  acidophilus (RISAQUAD) CAPS capsule Take 1 capsule by mouth 3 (three) times daily as needed.    Historical Provider, MD  adapalene (DIFFERIN) 0.1 % cream Apply 1 application topically as needed (acne).    Historical Provider, MD  bifidobacterium infantis (ALIGN) capsule Take 1 capsule by mouth at bedtime.    Historical Provider, MD  carbamazepine (TEGRETOL XR) 200 MG 12 hr tablet Take 200 mg by mouth 3 (three) times daily. 8am, 2pm, 8pm    Historical Provider, MD  clindamycin-benzoyl peroxide (BENZACLIN) gel Apply 1 application topically as needed (acne).    Historical Provider, MD  FLUoxetine (PROZAC) 20 MG capsule Take 20 mg by mouth every morning.    Historical Provider, MD  lactase (LACTAID) 3000 UNITS tablet Take 3,000 Units by mouth 4 (four) times daily as needed (dairy).    Historical Provider, MD  levothyroxine (SYNTHROID, LEVOTHROID) 100 MCG tablet Take 100 mcg by mouth daily before breakfast.     Historical Provider, MD  lithium carbonate (ESKALITH) 450 MG CR tablet Take 450-900 mg by mouth 2 (two) times daily. Takes 1 tablet in morning and 2 tablets in evening 10/19/11   Lyanne Co, MD  LORazepam (ATIVAN) 1 MG tablet Take 1 mg by mouth every 8 (eight) hours as needed for anxiety.  Historical Provider, MD  medroxyPROGESTERone (DEPO-PROVERA) 150 MG/ML injection Inject 150 mg into the muscle every 3 (three) months.    Historical Provider, MD  metFORMIN (GLUCOPHAGE) 500 MG tablet Take 500-1,000 mg by mouth 2 (two) times daily with a meal. 500 mg in the morning and 1000 mg at bedtime    Historical Provider, MD  methylphenidate 27 MG PO CR tablet Take 27 mg by mouth every morning.    Historical Provider, MD  OLANZapine (ZYPREXA) 7.5 MG tablet Take 7.5 mg by mouth every morning.    Historical Provider, MD  OLANZapine zydis (ZYPREXA) 10 MG disintegrating  tablet Take 10 mg by mouth daily as needed (behavior).    Historical Provider, MD  OLANZapine zydis (ZYPREXA) 5 MG disintegrating tablet Take 5 mg by mouth as needed.    Historical Provider, MD  omeprazole (PRILOSEC) 20 MG capsule Take 20 mg by mouth every morning.     Historical Provider, MD  polycarbophil (FIBERCON) 625 MG tablet Take 625 mg by mouth daily.    Historical Provider, MD   BP 116/80  Pulse 87  Temp(Src) 98.6 F (37 C) (Oral)  Resp 16  SpO2 98% Physical Exam  Nursing note and vitals reviewed. Constitutional: She is oriented to person, place, and time. She appears well-developed and well-nourished. No distress.  HENT:  Head: Normocephalic and atraumatic.  Mouth/Throat: Oropharynx is clear and moist.  Eyes: EOM are normal. Pupils are equal, round, and reactive to light.  Neck: Normal range of motion. Neck supple.  Cardiovascular: Normal rate and regular rhythm.  Exam reveals no friction rub.   No murmur heard. Pulmonary/Chest: Effort normal and breath sounds normal. No respiratory distress. She has no wheezes. She has no rales.  Abdominal: Soft. She exhibits no distension. There is no tenderness. There is no rebound.  Musculoskeletal: Normal range of motion. She exhibits no edema.  Neurological: She is alert and oriented to person, place, and time. No cranial nerve deficit. She exhibits normal muscle tone. Coordination normal.  Skin: No rash noted. She is not diaphoretic.    ED Course  Procedures (including critical care time) Labs Review Labs Reviewed  ACETAMINOPHEN LEVEL  CBC  COMPREHENSIVE METABOLIC PANEL  ETHANOL  SALICYLATE LEVEL  URINE RAPID DRUG SCREEN (HOSP PERFORMED)    Imaging Review No results found.   EKG Interpretation None      MDM   Final diagnoses:  Suicidal ideations  Auditory hallucinations   43F presents with SI, hallucinations. Voices are saying "kill me, kill me." No hx of auditory hallucinations. Here with law enforcement.  Endorses SI with plan to hang herself. Hx of suicide attempt by drinking bleach. Patient here cooperative. Will consult TTS. R hand xray normal, was reportedly punching the wall.  Elwin MochaBlair Jojo Geving, MD 12/14/13 [phone]

## 2013-12-13 NOTE — ED Notes (Signed)
[facility] Expected date:  Expected time:  Means of arrival:  Comments: [place]

## 2013-12-13 NOTE — ED Notes (Signed)
Pt and [employer] reports that pt goes to [facility], which she is not allowed to go back to. Pt reports nothing that triggered her, but she started destroying the dry wall and ceiling of a room,pt cut her right wrist, punched walls. C/o of right hand pain 5/10. [employer] called and brought pt to ED. Pt reports she is SI, paln is to hang herself from ceiling fan at home. Pt has attempted SI in past by drinking bleach. Pt denies HI. Reports auditory hallucinations, voices saying "kill me kill me". Pt denies VH. Pt denies ETOH or drugs.
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
## 2013-12-13 NOTE — ED Notes (Signed)
Pt provided with coloring pages and word puzzles. 

## 2013-12-13 NOTE — ED Notes (Signed)
Patient has two bags of belongings in locker 32.

## 2013-12-14 ENCOUNTER — Encounter (HOSPITAL_COMMUNITY): Payer: Self-pay | Admitting: Psychiatry

## 2013-12-14 DIAGNOSIS — F319 Bipolar disorder, unspecified: Secondary | ICD-10-CM

## 2013-12-14 DIAGNOSIS — F909 Attention-deficit hyperactivity disorder, unspecified type: Secondary | ICD-10-CM

## 2013-12-14 DIAGNOSIS — F4310 Post-traumatic stress disorder, unspecified: Secondary | ICD-10-CM

## 2013-12-14 DIAGNOSIS — F3164 Bipolar disorder, current episode mixed, severe, with psychotic features: Secondary | ICD-10-CM | POA: Diagnosis present

## 2013-12-14 LAB — CBG MONITORING, ED
Glucose-Capillary: 126 mg/dL — ABNORMAL HIGH (ref 70–99)
Glucose-Capillary: 80 mg/dL (ref 70–99)
Glucose-Capillary: 100 mg/dL — ABNORMAL HIGH (ref 70–99)

## 2013-12-14 MED ORDER — LITHIUM CARBONATE ER 450 MG PO TBCR: 450.0000 mg | EXTENDED_RELEASE_TABLET | Freq: Two times a day (BID) | ORAL | Status: DC

## 2013-12-14 MED ORDER — CARBAMAZEPINE ER 200 MG PO TB12: 200.0000 mg | ORAL_TABLET | Freq: Three times a day (TID) | ORAL | Status: DC

## 2013-12-14 MED ORDER — LEVOTHYROXINE SODIUM 100 MCG PO TABS: 100.0000 ug | ORAL_TABLET | Freq: Every day | ORAL | Status: DC

## 2013-12-14 MED ORDER — RISAQUAD PO CAPS: 1.0000 | ORAL_CAPSULE | Freq: Three times a day (TID) | ORAL | Status: DC | PRN

## 2013-12-14 MED ORDER — OLANZAPINE 10 MG PO TABS
10.0000 mg | ORAL_TABLET | Freq: Every morning | ORAL | Status: DC
  Administered 2013-12-14: 10 mg via ORAL
  Filled 2013-12-14: qty 1

## 2013-12-14 MED ORDER — FLUOXETINE HCL 20 MG PO CAPS: 20.0000 mg | ORAL_CAPSULE | Freq: Every morning | ORAL | Status: DC

## 2013-12-14 MED ORDER — METHYLPHENIDATE HCL ER (OSM) 27 MG PO TBCR: 27.0000 mg | EXTENDED_RELEASE_TABLET | ORAL | Status: DC

## 2013-12-14 MED ORDER — OLANZAPINE 10 MG PO TBDP: 10.0000 mg | ORAL_TABLET | Freq: Every day | ORAL | Status: DC

## 2013-12-14 MED ORDER — METFORMIN HCL ER (MOD) 500 MG PO TB24: 500.0000 mg | ORAL_TABLET | Freq: Every day | ORAL | Status: DC

## 2013-12-14 MED ORDER — OMEPRAZOLE 20 MG PO CPDR: 20.0000 mg | DELAYED_RELEASE_CAPSULE | Freq: Every morning | ORAL | Status: DC

## 2013-12-14 MED ORDER — MEDROXYPROGESTERONE ACETATE 150 MG/ML IM SUSP: 150.0000 mg | INTRAMUSCULAR | Status: DC

## 2013-12-14 MED ORDER — METHYLPHENIDATE HCL ER (OSM) 18 MG PO TBCR
18.0000 mg | EXTENDED_RELEASE_TABLET | Freq: Every day | ORAL | Status: DC
  Administered 2013-12-14: 18 mg via ORAL
  Filled 2013-12-14: qty 1

## 2013-12-14 MED ORDER — ALIGN PO CAPS: 1.0000 | ORAL_CAPSULE | Freq: Every day | ORAL | Status: DC

## 2013-12-14 MED ORDER — OLANZAPINE 10 MG PO TABS: 10.0000 mg | ORAL_TABLET | Freq: Every day | ORAL | Status: DC

## 2013-12-14 NOTE — ED Notes (Signed)
Pt has been visible in the milieu. She has had no complaints of pain or of any auditory hallucinations. Her zyprexa was held temporarily because rx wanted some clarification. Clarification was obtained and the zyprexa was administered. No si/hi presently but this pt has a hx of suicide attempts. She has been eating all day requesting snacks about every 45 min. She is a diabetic and some of the snacks are high in concentrated sugars and carbs. rn advised her of refraining from carbs and concentrated sugars. She will seek out another staff member to have these request met.  Her last cbg was 80 at 12 noon. rn continues to monitor.
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
## 2013-12-14 NOTE — Progress Notes (Addendum)
2:10pm. CSW spoke Violet Baldyeresa Amusan [phone]), pt's caretaker. She states she will pick pt up around 7pm tonight. RN aware.  CSW provided updated contact information to registration.   CSW to sign off.  _______ CSW called Violet Baldyeresa Amusan, pt's caretaker at pt's [facility] group home. CSW unable to reach and unable to leave message.  CSW called Teresa's supervising agency, [facility], and spoke with administrator LaToya [phone]). Glee ArvinLaToya stated she would attempt Rosey Batheresa and would call this CSW back shortly.   CSW to continue to follow.  Mariann LasterAlexandra Jerick Khachatryan LCSWA,     ED CSW  phone: [phone] 12:32 pm

## 2013-12-14 NOTE — ED Notes (Signed)
Pt's ride will come during shift change. S/w advised me, rn to call teresa at [phone] if they don't pick up the pt by 0730 pm. If teresa cant be reached call the supervisor at [phone]. Gave this information in report to the oncoming RN.

## 2013-12-14 NOTE — [facility] Suicide Risk Assessment (Signed)
Suicide Risk Assessment  Discharge Assessment     Demographic Factors:  Caucasian  Total Time spent with patient: 20 minutes  Psychiatric Specialty Exam:     Blood pressure 95/54, pulse 74, temperature 98.1 F (36.7 C), temperature source Oral, resp. rate 17, SpO2 100.00%.There is no weight on file to calculate BMI.  General Appearance: Casual  Eye Contact::  Good  Speech:  Normal Rate  Volume:  Normal  Mood:  Euthymic  Affect:  Congruent  Thought Process:  Coherent  Orientation:  Full (Time, Place, and Person)  Thought Content:  Hallucinations: Auditorymild  Suicidal Thoughts:  No  Homicidal Thoughts:  No  Memory:  Immediate;   Good Recent;   Good Remote;   Good  Judgement:  Fair  Insight:  Fair  Psychomotor Activity:  Normal  Concentration:  Good  Recall:  Good  Fund of Knowledge:Fair  Language: Good  Akathisia:  No  Handed:  Right  AIMS (if indicated):     Assets:  Health and safety inspectorinancial Resources/Insurance Housing Leisure Time Physical Health Resilience Social Support  Sleep:       Musculoskeletal: Strength & Muscle Tone: within normal limits Gait & Station: normal Patient leans: N/A   Mental Status Per Nursing Assessment::   On Admission:   Auditory hallucinations  Current Mental Status by Physician: NA  Loss Factors: NA  Historical Factors: NA  Risk Reduction Factors:   Living with another person, especially a relative, Positive social support and Positive therapeutic relationship  Continued Clinical Symptoms:  Mild auditory hallucinations  Cognitive Features That Contribute To Risk:  N/A  Suicide Risk:  Minimal: No identifiable suicidal ideation.  Patients presenting with no risk factors but with morbid ruminations; may be classified as minimal risk based on the severity of the depressive symptoms  Discharge Diagnoses:   AXIS I:  Bipolar disorder, ADHD, PTSD AXIS II:  Deferred AXIS III:   Past Medical History  Diagnosis Date  . Bipolar 1  disorder   . Gluten free diet   . ADHD (attention deficit hyperactivity disorder)   . PTSD (post-traumatic stress disorder)   . 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 Mood disorder   . Diabetes mellitus   . Obesity   . Depression   . Hypothyroidism    AXIS IV:  other psychosocial or environmental problems, problems related to social environment and problems with primary support group AXIS V:  61-70 mild symptoms  Plan Of Care/Follow-up recommendations:  Activity:  as tolerated Diet:  low-sodium heart healthy diet  Is patient on multiple antipsychotic therapies at discharge:  No   Has Patient had three or more failed trials of antipsychotic monotherapy by history:  No  Recommended Plan for Multiple Antipsychotic Therapies: NA    Lossie Kalp, PMH-NP 12/14/2013, 10:50 AM

## 2014-03-18 ENCOUNTER — Inpatient Hospital Stay (HOSPITAL_COMMUNITY)
Admission: RE | Admit: 2014-03-18 | Discharge: 2014-03-20 | DRG: 885 | Disposition: A | Discharge status: Home / Self Care | Payer: Medicaid Other | Attending: Psychiatry | Admitting: Psychiatry

## 2014-03-18 ENCOUNTER — Encounter (HOSPITAL_COMMUNITY): Payer: Self-pay | Admitting: *Deleted

## 2014-03-18 DIAGNOSIS — F4310 Post-traumatic stress disorder, unspecified: Secondary | ICD-10-CM | POA: Diagnosis present

## 2014-03-18 DIAGNOSIS — F340 Cyclothymic disorder: Secondary | ICD-10-CM | POA: Diagnosis present

## 2014-03-18 DIAGNOSIS — E039 Hypothyroidism, unspecified: Secondary | ICD-10-CM | POA: Diagnosis present

## 2014-03-18 DIAGNOSIS — F250 Schizoaffective disorder, bipolar type: Principal | ICD-10-CM | POA: Diagnosis present

## 2014-03-18 DIAGNOSIS — E119 Type 2 diabetes mellitus without complications: Secondary | ICD-10-CM | POA: Diagnosis present

## 2014-03-18 DIAGNOSIS — F913 Oppositional defiant disorder: Secondary | ICD-10-CM | POA: Diagnosis present

## 2014-03-18 DIAGNOSIS — R45851 Suicidal ideations: Secondary | ICD-10-CM | POA: Diagnosis present

## 2014-03-18 DIAGNOSIS — F70 Mild intellectual disabilities: Secondary | ICD-10-CM | POA: Diagnosis present

## 2014-03-18 DIAGNOSIS — K219 Gastro-esophageal reflux disease without esophagitis: Secondary | ICD-10-CM | POA: Diagnosis present

## 2014-03-18 DIAGNOSIS — F909 Attention-deficit hyperactivity disorder, unspecified type: Secondary | ICD-10-CM | POA: Diagnosis present

## 2014-03-18 DIAGNOSIS — F29 Unspecified psychosis not due to a substance or known physiological condition: Secondary | ICD-10-CM | POA: Diagnosis present

## 2014-03-18 LAB — GLUCOSE, CAPILLARY: Glucose-Capillary: 117 mg/dL — ABNORMAL HIGH (ref 70–99)

## 2014-03-18 MED ORDER — METFORMIN HCL ER 500 MG PO TB24
500.0000 mg | ORAL_TABLET | Freq: Every day | ORAL | Status: DC
  Administered 2014-03-19 – 2014-03-20 (×2): 500 mg via ORAL
  Filled 2014-03-18: qty 14
  Filled 2014-03-18 (×3): qty 1

## 2014-03-18 MED ORDER — LITHIUM CARBONATE ER 450 MG PO TBCR
450.0000 mg | EXTENDED_RELEASE_TABLET | Freq: Every morning | ORAL | Status: DC
  Administered 2014-03-19 – 2014-03-20 (×2): 450 mg via ORAL
  Filled 2014-03-18 (×2): qty 1
  Filled 2014-03-18: qty 14
  Filled 2014-03-18: qty 1

## 2014-03-18 MED ORDER — FLUOXETINE HCL 20 MG PO CAPS
20.0000 mg | ORAL_CAPSULE | Freq: Every day | ORAL | Status: DC
  Administered 2014-03-18 – 2014-03-20 (×3): 20 mg via ORAL
  Filled 2014-03-18: qty 14
  Filled 2014-03-18 (×5): qty 1

## 2014-03-18 MED ORDER — LEVOTHYROXINE SODIUM 100 MCG PO TABS
100.0000 ug | ORAL_TABLET | Freq: Every day | ORAL | Status: DC
  Administered 2014-03-19 – 2014-03-20 (×2): 100 ug via ORAL
  Filled 2014-03-18: qty 1
  Filled 2014-03-18: qty 14
  Filled 2014-03-18 (×2): qty 1

## 2014-03-18 MED ORDER — LITHIUM CARBONATE ER 450 MG PO TBCR: 450.0000 mg | EXTENDED_RELEASE_TABLET | Freq: Every morning | ORAL | Status: DC

## 2014-03-18 MED ORDER — OLANZAPINE 10 MG PO TBDP
10.0000 mg | ORAL_TABLET | Freq: Every day | ORAL | Status: DC
  Administered 2014-03-18: 10 mg via ORAL
  Filled 2014-03-18 (×4): qty 1

## 2014-03-18 MED ORDER — LORAZEPAM 2 MG/ML IJ SOLN: 1.0000 mg | Freq: Four times a day (QID) | INTRAMUSCULAR | Status: DC | PRN

## 2014-03-18 MED ORDER — ALUM & MAG HYDROXIDE-SIMETH 200-200-20 MG/5ML PO SUSP: 30.0000 mL | ORAL | Status: DC | PRN

## 2014-03-18 MED ORDER — MAGNESIUM HYDROXIDE 400 MG/5ML PO SUSP
30.0000 mL | Freq: Every day | ORAL | Status: DC | PRN
  Administered 2014-03-20: 30 mL via ORAL
  Filled 2014-03-18: qty 30

## 2014-03-18 MED ORDER — LITHIUM CARBONATE ER 450 MG PO TBCR
900.0000 mg | EXTENDED_RELEASE_TABLET | Freq: Every day | ORAL | Status: DC
  Administered 2014-03-18 – 2014-03-19 (×2): 900 mg via ORAL
  Filled 2014-03-18: qty 28
  Filled 2014-03-18 (×4): qty 2

## 2014-03-18 MED ORDER — CARBAMAZEPINE ER 200 MG PO TB12
200.0000 mg | ORAL_TABLET | Freq: Three times a day (TID) | ORAL | Status: DC
  Administered 2014-03-18 – 2014-03-20 (×7): 200 mg via ORAL
  Filled 2014-03-18: qty 1
  Filled 2014-03-18: qty 42
  Filled 2014-03-18: qty 1
  Filled 2014-03-18: qty 42
  Filled 2014-03-18 (×2): qty 1
  Filled 2014-03-18: qty 42
  Filled 2014-03-18 (×6): qty 1

## 2014-03-18 MED ORDER — ACETAMINOPHEN 325 MG PO TABS
650.0000 mg | ORAL_TABLET | Freq: Four times a day (QID) | ORAL | Status: DC | PRN
  Administered 2014-03-19 (×2): 650 mg via ORAL
  Filled 2014-03-18 (×2): qty 2

## 2014-03-18 MED ORDER — LORAZEPAM 1 MG PO TABS
1.0000 mg | ORAL_TABLET | Freq: Four times a day (QID) | ORAL | Status: DC | PRN
  Administered 2014-03-19: 1 mg via ORAL
  Filled 2014-03-18: qty 1

## 2014-03-18 MED ORDER — PANTOPRAZOLE SODIUM 20 MG PO TBEC
20.0000 mg | DELAYED_RELEASE_TABLET | Freq: Every day | ORAL | Status: DC
  Administered 2014-03-19 – 2014-03-20 (×2): 20 mg via ORAL
  Filled 2014-03-18 (×2): qty 1
  Filled 2014-03-18: qty 14
  Filled 2014-03-18: qty 1
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
## 2014-03-18 NOTE — Progress Notes (Signed)
The focus of this group is to help patients review their daily goal of treatment and discuss progress on daily workbooks. Pt attended the evening group session and responded to all discussion prompts from the Writer. Pt shared that today was an okay day, that she was glad to be here getting help. Pt told the group that her plans upon discharge included connecting with her birth mother, with whom Pt already has a good relationship with, in order to go live with her in [place] rather than a group home. Pt's affect was appropriate and she volunteered several encouraging comments to her peers.

## 2014-03-18 NOTE — [facility] Group Notes (Signed)
[facility] LCSW Group Therapy  03/18/2014 12:30 PM  Type of Therapy:  Group Therapy  Participation Level:  Did Not Attend  Participation Quality:  na  Affect:  na  Cognitive:  na  Insight:   na  Engagement in Therapy:  na  Modes of Intervention:  Discussion, Education and Exploration  Summary of Progress/Problems: Emotion Regulation: This group focused on both positive and negative emotion identification and allowed group members to process ways to identify feelings, regulate negative emotions, and find healthy ways to manage internal/external emotions. Group members were asked to reflect on a time when their reaction to an emotion led to a negative outcome and explored how alternative responses using emotion regulation would have benefited them. Group members were also asked to discuss a time when emotion regulation was utilized when a negative emotion was experienced.  Patient invited but did not attend.    Sallee LangeCunningham, Anne C 03/18/2014, 12:30 PM

## 2014-03-18 NOTE — [facility] Counselor (Signed)
Writer left voicemail for [name] [phone] AFL provider [facility]. Writer left voicemail for DSS worker and pt's guardian [name] [phone] [facility].  Evette Cristalaroline Paige Megen [place], [place] Assessment Counselor
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
## 2014-03-18 NOTE — Tx Team (Signed)
Initial Interdisciplinary Treatment Plan  PATIENT STRESSORS: Legal issue Traumatic event   PATIENT STRENGTHS: Communication skills General fund of knowledge Motivation for treatment/growth Physical Health   PROBLEM LIST: Problem List/Patient Goals Date to be addressed Date deferred Reason deferred Estimated date of resolution  SI 03/18/14     AH 03/18/14           "I just think I need my meds adjusted - they aren't working."            "I want the voices to stop."                         DISCHARGE CRITERIA:  Ability to meet basic life and health needs Improved stabilization in mood, thinking, and/or behavior Motivation to continue treatment in a less acute level of care Need for constant or close observation no longer present Reduction of life-threatening or endangering symptoms to within safe limits Safe-care adequate arrangements made Verbal commitment to aftercare and medication compliance  PRELIMINARY DISCHARGE PLAN: Attend aftercare/continuing care group Outpatient therapy Return to previous living arrangement  PATIENT/FAMIILY INVOLVEMENT: This treatment plan has been presented to and reviewed with the patient, Holly [name], and/or family member.  The patient and family have been given the opportunity to ask questions and make suggestions.  Lawrence MarseillesFriedman, [name] 03/18/2014, 3:02 PM
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
## 2014-03-19 LAB — LIPID PANEL
Cholesterol: 202 mg/dL — ABNORMAL HIGH (ref 0–200)
HDL: 52 mg/dL (ref >39)
LDL Cholesterol: 122 mg/dL — ABNORMAL HIGH (ref 0–99)
Total CHOL/HDL Ratio: 3.9 RATIO
Triglycerides: 138 mg/dL (ref <150)
VLDL: 28 mg/dL (ref 0–40)

## 2014-03-19 LAB — GLUCOSE, CAPILLARY
Glucose-Capillary: 118 mg/dL — ABNORMAL HIGH (ref 70–99)
Glucose-Capillary: 126 mg/dL — ABNORMAL HIGH (ref 70–99)
Glucose-Capillary: 134 mg/dL — ABNORMAL HIGH (ref 70–99)
Glucose-Capillary: 134 mg/dL — ABNORMAL HIGH (ref 70–99)

## 2014-03-19 LAB — LITHIUM LEVEL: Lithium Lvl: 0.58 mEq/L — ABNORMAL LOW (ref 0.80–1.40)

## 2014-03-19 LAB — TSH: TSH: 8.33 u[IU]/mL — ABNORMAL HIGH (ref 0.350–4.500)

## 2014-03-19 LAB — HEMOGLOBIN A1C
Hgb A1c MFr Bld: 4.9 % (ref <5.7)
Mean Plasma Glucose: 94 mg/dL (ref <117)

## 2014-03-19 MED ORDER — OLANZAPINE 5 MG PO TBDP
15.0000 mg | ORAL_TABLET | Freq: Every day | ORAL | Status: DC
  Administered 2014-03-19: 15 mg via ORAL
  Filled 2014-03-19 (×2): qty 1
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
## 2014-03-19 NOTE — Plan of Care (Signed)
Problem: Diagnosis: Increased Risk For Suicide Attempt Goal: STG-Patient Will Comply With Medication Regime Outcome: Progressing Pt is compliant with medication regimen this shift.     

## 2014-03-19 NOTE — Progress Notes (Signed)
Patient ID: Holly [name], female   DOB: 1995/09/09, 18 y.o.   MRN: [id number] D: Patient in dayroom on approach. Pt mood and affect appeared anxious but appropriate. Pt denies SI/HI/AVH and contract to come to staff if feeling unsafe. Pt attended evening wrap up group and engage in discussion.  Cooperative with assessment. No acute distressed noted at this time.   A: Met with pt 1:1. Medications administered as prescribed. Writer encouraged pt to discuss feelings. Pt encouraged to come to staff with any question or concerns.   R: Patient remains safe. She is complaint with medications and denies any adverse reaction.
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
## 2014-03-19 NOTE — Progress Notes (Signed)
Adult Psychoeducational Group Note  Date:  03/19/2014 Time:  10:10 PM  Group Topic/Focus:  Wrap-Up Group:   The focus of this group is to help patients review their daily goal of treatment and discuss progress on daily workbooks.  Participation Level:  Active  Participation Quality:  Appropriate  Affect:  Appropriate  Cognitive:  Appropriate  Insight: Appropriate  Engagement in Group:  Engaged  Modes of Intervention:  Discussion  Additional Comments:  The patient expressed that in group the speaker talk about life.The patient said that group inspired her to be better.  Octavio Mannshigpen, Vin Yonke Lee 03/19/2014, 10:10 PM

## 2014-03-19 NOTE — Progress Notes (Signed)
[facility]  03/19/2014 1:38 PM Holly [name]  MRN:  [id number] Subjective: Patient states ,'I feel good now  ,I heard voices last night to "kill"." Objective: Patient seen and chart reviewed. Pt was brought in for mood lability as well as psychosis. Pt had HI towards her day program staff as well as SI.Pt had cut herself several times with a piece of glass and reported thoughts about killing herself. Pt however after admission appears to be very calm and cooperative ,has not had any periods of irritability or mood swings. Pt reports being happy . Pt does endorse AH ,asking her to "kill".The last time she heard it was last night. Patient reports sleep as fair and appetite as good. Pt reports wanting to take a shower and change her clothes ,reports she wants her own clothes and wants someone to bring it for her since 'scrubs " gives ger rashes. Patient denies SI/HI/VH. Patient is compliant on medications. Patient denies any side effects.     Diagnosis:   DSM5: Primary Psychiatric Diagnosis: Schizoaffective disorder ,bipolarb type ,multiple episodes currently in acute episode   Secondary Psychiatric Diagnosis: PTSD ADHD per hx Mild ID per hx  ODD per hx    Non Psychiatric Diagnosis: See MAR   Total Time spent with patient: 30 minutes   ADL's:  Intact  Sleep: Fair  Appetite:  Good   Psychiatric Specialty Exam: Physical Exam  ROS  Blood pressure 121/68, pulse 82, temperature 98.1 F (36.7 C), temperature source Oral, resp. rate 20, height 5' 2.75" (1.594 m), weight 97.07 kg (214 lb).Body mass index is 38.2 kg/(m^2).  General Appearance: Fairly Groomed  Patent attorneyye Contact::  Fair  Speech:  Normal Rate  Volume:  Normal  Mood:  Euthymic  Affect:  Appropriate  Thought Process:  Goal Directed  Orientation:  Full (Time, Place, and Person)  Thought Content:  Hallucinations: Auditory  Suicidal Thoughts:  No  Homicidal Thoughts:  No  Memory:  Immediate;   grossly  intact Recent;   grossly intact Remote;   grossly intact  Judgement:  Fair  Insight:  Shallow  Psychomotor Activity:  Normal  Concentration:  Fair  Recall:  Fair  Fund of Knowledge:Fair  Language: Good  Akathisia:  No  Handed:  Right  AIMS (if indicated):     Assets:  Communication Skills Desire for Improvement Physical Health Social Support  Sleep:  Number of Hours: 6.5   Musculoskeletal: Strength & Muscle Tone: within normal limits Gait & Station: normal Patient leans: N/A  Current Medications: Current Facility-Administered Medications  Medication Dose Route Frequency Provider Last Rate Last Dose  . acetaminophen (TYLENOL) tablet 650 mg  650 mg Oral Q6H PRN Jomarie LongsSaramma Enjoli Tidd, MD   650 mg at 03/19/14 1157  . alum & mag hydroxide-simeth (MAALOX/MYLANTA) 200-200-20 MG/5ML suspension 30 mL  30 mL Oral Q4H PRN Jomarie LongsSaramma Kip Cropp, MD      . carbamazepine (TEGRETOL XR) 12 hr tablet 200 mg  200 mg Oral TID Jomarie LongsSaramma Charnette Younkin, MD   200 mg at 03/19/14 1157  . FLUoxetine (PROZAC) capsule 20 mg  20 mg Oral Daily Jomarie LongsSaramma Nitara Szczerba, MD   20 mg at 03/19/14 0817  . levothyroxine (SYNTHROID, LEVOTHROID) tablet 100 mcg  100 mcg Oral QAC breakfast Jomarie LongsSaramma Annia Gomm, MD   100 mcg at 03/19/14 [id number]  . lithium carbonate (ESKALITH) CR tablet 450 mg  450 mg Oral q morning - 10a Coley Littles, MD   450 mg at 03/19/14 1014  . lithium carbonate (ESKALITH)  CR tablet 900 mg  900 mg Oral QHS Jomarie LongsSaramma Akashdeep Chuba, MD   900 mg at 03/18/14 2131  . LORazepam (ATIVAN) tablet 1 mg  1 mg Oral Q6H PRN Jomarie LongsSaramma Kenai Fluegel, MD       Or  . LORazepam (ATIVAN) injection 1 mg  1 mg Intramuscular Q6H PRN Shep Porter, MD      . magnesium hydroxide (MILK OF MAGNESIA) suspension 30 mL  30 mL Oral Daily PRN Jomarie LongsSaramma Anurag Scarfo, MD      . metFORMIN (GLUCOPHAGE-XR) 24 hr tablet 500 mg  500 mg Oral Q breakfast Jomarie LongsSaramma Kailan Laws, MD   500 mg at 03/19/14 0817  . OLANZapine zydis (ZYPREXA) disintegrating tablet 15 mg  15 mg Oral QHS Anea Fodera, MD      .  pantoprazole (PROTONIX) EC tablet 20 mg  20 mg Oral Daily Jomarie LongsSaramma Aarini Slee, MD   20 mg at 03/19/14 [id number]    Lab Results:  Results for orders placed or performed during the hospital encounter of 03/18/14 (from the past 48 hour(s))  Glucose, capillary     Status: Abnormal   Collection Time: 03/18/14  8:59 PM  Result Value Ref Range   Glucose-Capillary 117 (H) 70 - 99 mg/dL  Glucose, capillary     Status: Abnormal   Collection Time: 03/19/14  6:13 AM  Result Value Ref Range   Glucose-Capillary 118 (H) 70 - 99 mg/dL   Comment 1 Notify RN   Lithium level     Status: Abnormal   Collection Time: 03/19/14  6:29 AM  Result Value Ref Range   Lithium Lvl 0.58 (L) 0.80 - 1.40 mEq/L    Comment: Performed [facility]  Lipid panel     Status: Abnormal   Collection Time: 03/19/14  6:29 AM  Result Value Ref Range   Cholesterol 202 (H) 0 - 200 mg/dL   Triglycerides 469138 <629<150 mg/dL   HDL 52 >52>39 mg/dL   Total CHOL/HDL Ratio 3.9 RATIO   VLDL 28 0 - 40 mg/dL   LDL Cholesterol 841122 (H) 0 - 99 mg/dL    Comment:        Total Cholesterol/HDL:CHD Risk Coronary Heart Disease Risk Table                     Men   Women  1/2 Average Risk   3.4   3.3  Average Risk       5.0   4.4  2 X Average Risk   9.6   7.1  3 X Average Risk  23.4   11.0        Use the calculated Patient Ratio above and the CHD Risk Table to determine the patient's CHD Risk.        ATP III CLASSIFICATION (LDL):  <100     mg/dL   Optimal  [phone]  mg/dL   Near or Above                    Optimal  130-159  mg/dL   Borderline  [phone]  mg/dL   High  >664>190     mg/dL   Very High Performed at [facility]   Hemoglobin A1c     Status: None   Collection Time: 03/19/14  6:29 AM  Result Value Ref Range   Hgb A1c MFr Bld 4.9 <5.7 %    Comment: (NOTE)  According to the ADA Clinical Practice Recommendations for 2011, when HbA1c is used as a  screening test:  >=6.5%   Diagnostic of Diabetes Mellitus           (if abnormal result is confirmed) 5.7-6.4%   Increased risk of developing Diabetes Mellitus References:Diagnosis and Classification of Diabetes Mellitus,Diabetes Care,2011,34(Suppl 1):S62-S69 and Standards of Medical Care in         Diabetes - 2011,Diabetes Care,2011,34 (Suppl 1):S11-S61.    Mean Plasma Glucose 94 <117 mg/dL    Comment: Performed at [facility]  Glucose, capillary     Status: Abnormal   Collection Time: 03/19/14 11:52 AM  Result Value Ref Range   Glucose-Capillary 126 (H) 70 - 99 mg/dL   Comment 1 Notify RN     Physical Findings: AIMS: Facial and Oral Movements Muscles of Facial Expression: None, normal Lips and Perioral Area: None, normal Jaw: None, normal Tongue: None, normal,Extremity Movements Upper (arms, wrists, hands, fingers): None, normal Lower (legs, knees, ankles, toes): None, normal, Trunk Movements Neck, shoulders, hips: None, normal, Overall Severity Severity of abnormal movements (highest score from questions above): None, normal Incapacitation due to abnormal movements: None, normal Patient's awareness of abnormal movements (rate only patient's report): No Awareness, Dental Status Current problems with teeth and/or dentures?: No Does patient usually wear dentures?: No  CIWA:    COWS:     Treatment Plan Summary: Daily contact with patient to assess and evaluate symptoms and progress in treatment Medication management  Plan: Patient will benefit from inpatient treatment and stabilization.   Will restart her home medications as needed. Will reassess for the need for medication readjsutment. Will increase her Zyprexa to 15 mg po qhs. Will continue Tegretol and Lithium as scheduled.Pt reports mood as "good". No irritability or mood swings noted.  Will continue to monitor vitals ,medication compliance and treatment side effects while patient is here.  Will monitor  for medical issues as well as call consult as needed.  Reviewed labs ,Lipid panel as well as Hba1c. CSW will start working on disposition.  Patient to participate in therapeutic milieu .       Medical Decision Making Problem Points:  Established problem, stable/improving (1), Review of psycho-social stressors (1) and Self-limited or minor (1) Data Points:  Order Aims Assessment (2) Review of medication regiment & side effects (2)  I certify that inpatient services furnished can reasonably be expected to improve the patient's condition.   Nikan Ellingson MD 03/19/2014, 1:38 PM

## 2014-03-19 NOTE — Progress Notes (Signed)
D: Patient denies SI/HI and A/V hallucinations; patient reports sleep is good; reports appetite is good; reports energy level is normal ; reports ability to concentrate is good; rates depression as 3/10; rates hopelessness 3/10; rates anxiety as 5/10;   A: Monitored q 15 minutes; patient encouraged to attend groups; patient educated about medications; patient given medications per physician orders; patient encouraged to express feelings and/or concerns  R: Patient is pleasant and cooperative; patient is engaging and laughing and joking; patient keeps the conversation superficial; patient's interaction with staff and peers is appropriate; patient was able to set goal to talk with staff 1:1 when having feelings of SI; patient is taking medications as prescribed and tolerating medications; patient is attending all groups
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
## 2014-03-19 NOTE — Progress Notes (Signed)
Patient ID: Holly [name], female   DOB: 22-Jan-1996, 18 y.o.   MRN: [id number] D: Patient in dayroom on approach. Pt mood and affect appeared anxious but appropriate. Pt denies SI/HI/AVH and contract to come to staff if feeling unsafe. Pt attended evening wrap up group and engage in discussion.  Cooperative with assessment. No acute distressed noted at this time.   A: Met with pt 1:1. Medications administered as prescribed. Writer encouraged pt to discuss feelings. Pt encouraged to come to staff with any question or concerns.   R: Patient remains safe. She is complaint with medications and denies any adverse reaction.

## 2014-03-20 DIAGNOSIS — F70 Mild intellectual disabilities: Secondary | ICD-10-CM | POA: Insufficient documentation

## 2014-03-20 DIAGNOSIS — F250 Schizoaffective disorder, bipolar type: Secondary | ICD-10-CM | POA: Insufficient documentation

## 2014-03-20 LAB — GLUCOSE, CAPILLARY: Glucose-Capillary: 112 mg/dL — ABNORMAL HIGH (ref 70–99)

## 2014-03-20 MED ORDER — OLANZAPINE 5 MG PO TBDP
15.0000 mg | ORAL_TABLET | Freq: Every day | ORAL | Status: DC
  Filled 2014-03-20 (×2): qty 42

## 2014-03-20 MED ORDER — LITHIUM CARBONATE ER 450 MG PO TBCR: 900.0000 mg | EXTENDED_RELEASE_TABLET | Freq: Every day | ORAL | Status: DC

## 2014-03-20 MED ORDER — LITHIUM CARBONATE ER 450 MG PO TBCR: 450.0000 mg | EXTENDED_RELEASE_TABLET | Freq: Every morning | ORAL | Status: DC

## 2014-03-20 MED ORDER — FLUOXETINE HCL 20 MG PO CAPS: 20.0000 mg | ORAL_CAPSULE | Freq: Every day | ORAL | Status: DC

## 2014-03-20 MED ORDER — CARBAMAZEPINE ER 200 MG PO TB12: 200.0000 mg | ORAL_TABLET | Freq: Three times a day (TID) | ORAL | Status: DC

## 2014-03-20 MED ORDER — OLANZAPINE 15 MG PO TBDP: 15.0000 mg | ORAL_TABLET | Freq: Every day | ORAL | Status: DC

## 2014-03-20 NOTE — Progress Notes (Signed)
Patient ID: Holly [name], female   DOB: 1995-12-11, 18 y.o.   MRN: [id number]  Pt. Denies SI/HI and A/V hallucinations to this Clinical research associatewriter. Belongings returned to patient at time of discharge. Patient denies any pain or discomfort. Discharge instructions and medications were reviewed with patient. Patient verbalized understanding of both medications and discharge instructions. Copy of AVS given to patient's group home and medications and AVS was discussed with them as well. No distress noted upon discharge. Q15 minute safety checks maintained until discharge.

## 2014-03-20 NOTE — Progress Notes (Signed)
[facility] :  Will you be returning to the same living situation after discharge: Yes,  returning to [facility] At discharge, do you have transportation home?:Yes,  [name] Sales promotion account executive(director of [facility]) coming after 4pm to transport pt home Do you have the ability to pay for your medications:Yes,  Medicaid  Release of information consent forms completed and submitted to medical records by CSW.  Patient to Follow up at: Follow-up Information    Follow up with [facility].   Why:  Follow-up with [facility] regarding med management, DBT, and therapy appts (prescheduled).    Contact information:   ATTN: Ronnie DossCrystal Nickerson [address] [place], [place] [address] Phone: [phone] Fax: [phone]      Follow up with [facility]   Why:  Attend day treatment program from 9am-3pm Monday through Friday.    Contact information:   [address] [place], [place] [address] Phone: [phone] Fax: [phone]      Patient denies SI/HI:   Yes,  during group/self report.     Safety Planning and Suicide Prevention discussed:  Yes,  SPE completed with pt's [facility] owner. SPI pamphlet provided to pt and she was encouraged to share information with support network, ask questions, and talk about any concerns relating to SPE.  Smart, Jakyle Petrucelli LCSWA  03/20/2014, 1:34 PM

## 2014-03-20 NOTE — BHH Suicide Risk Assessment (Signed)
   Demographic Factors:  Caucasian  Total Time spent with patient: 45 minutes  Psychiatric Specialty Exam: Physical Exam  ROS  Blood pressure 127/75, pulse 108, temperature 98.5 F (36.9 C), temperature source Oral, resp. rate 20, height 5' 2.75" (1.594 m), weight 97.07 kg (214 lb).Body mass index is 38.2 kg/(m^2).  General Appearance: Casual  Eye Contact::  Good  Speech:  Clear and Coherent  Volume:  Normal  Mood:  Euthymic  Affect:  Appropriate  Thought Process:  Coherent  Orientation:  Full (Time, Place, and Person)  Thought Content:  WDL  Suicidal Thoughts:  No  Homicidal Thoughts:  No  Memory:  Immediate;   Good Recent;   Good Remote;   Good  Judgement:  Fair  Insight:  Fair  Psychomotor Activity:  Normal  Concentration:  Fair  Recall:  Fair  Fund of Knowledge:Good  Language: Good  Akathisia:  No  Handed:  Right  AIMS (if indicated):     Assets:  Manufacturing systems engineerCommunication Skills Physical Health Social Support  Sleep:  Number of Hours: 4.5    Musculoskeletal: Strength & Muscle Tone: within normal limits Gait & Station: normal Patient leans: N/A   Mental Status Per Nursing Assessment::   On Admission:  Suicidal ideation indicated by patient, Suicide plan, Self-harm thoughts, Self-harm behaviors  Current Mental Status by Physician: Pt denies SI/HI/AH/VH  Loss Factors: Loss of significant relationship  Historical Factors: Impulsivity  Risk Reduction Factors:   Religious beliefs about death, Living with another person, especially a relative and Positive social support  Continued Clinical Symptoms:  Previous Psychiatric Diagnoses and Treatments  Cognitive Features That Contribute To Risk:  Pt has mild ID     Suicide Risk:  Minimal: No identifiable suicidal ideation.    Discharge Diagnoses:  DSM5: Primary Psychiatric Diagnosis: Schizoaffective disorder ,bipolar type ,multiple episodes currently in (acute episode-RESOLVED)  Secondary Psychiatric  Diagnosis: PTSD ADHD per hx Mild ID per hx  ODD per hx    Non Psychiatric Diagnosis: [facility]  Past Medical History  Diagnosis Date  . Bipolar 1 disorder   . Gluten free diet   . ADHD (attention deficit hyperactivity disorder)   . PTSD (post-traumatic stress disorder)   . 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 Mood disorder   . Diabetes mellitus   . Obesity   . Depression   . Hypothyroidism     Plan Of Care/Follow-up recommendations:  Activity:  no restrictions Diet:  carb modified diet  Is patient on multiple antipsychotic therapies at discharge:  No   Has Patient had three or more failed trials of antipsychotic monotherapy by history:  No  Recommended Plan for Multiple Antipsychotic Therapies: NA    Fields Oros MD 03/20/2014, 9:58 AM

## 2014-03-20 NOTE — BHH Suicide Risk Assessment (Signed)
BHH INPATIENT:  Family/Significant Other Suicide Prevention Education  Suicide Prevention Education:  Education Completed; Holly DossCrystal [name] (pt's [facility] owner) [phone]  has been identified by the patient as the family member/significant other with whom the patient will be residing, and identified as the person(s) who will aid the patient in the event of a mental health crisis (suicidal ideations/suicide attempt).  With written consent from the patient, the family member/significant other has been provided the following suicide prevention education, prior to the and/or following the discharge of the patient.  The suicide prevention education provided includes the following:  Suicide risk factors  Suicide prevention and interventions  National Suicide Hotline telephone number  [facility] assessment telephone number  [facility] Emergency Assistance 911  [facility] and/or Residential Mobile Crisis Unit telephone number  Request made of family/significant other to:  Remove weapons (e.g., guns, rifles, knives), all items previously/currently identified as safety concern.    Remove drugs/medications (over-the-counter, prescriptions, illicit drugs), all items previously/currently identified as a safety concern.  The family member/significant other verbalizes understanding of the suicide prevention education information provided.  The family member/significant other agrees to remove the items of safety concern listed above.  Holly [name], Holly [name] LCSWA  03/20/2014, 1:34 PM

## 2014-03-20 NOTE — Clinical Social Work Note (Signed)
CSW left message for Kerri PerchesCarla Townsend (pt's therapist?) to call back to verify appt time/date. CSW contacted [facility]-receptionist stated that Jodi MarbleSylvia Gross does not work on that facility. Message left for Tobey BrideErika Hendrix at [facility] (pt's day program). Message left for both [name] and [employer] CorporationCrystal Nickerson to discuss pt's d/c and get information regarding pt's follow-up (locations, providers, etc), as admission note appears to be inaccurate. CSW contacted [name] (pt's guardian/DSS [phone] to get fax number [phone] in order to sent releases once completed for her signature.  The Sherwin-WilliamsHeather Smart, LCSWA 03/20/2014 10:33 AM

## 2014-03-20 NOTE — Plan of Care (Signed)
Problem: Alteration in thought process Goal: STG-Patient does not respond to command hallucinations Outcome: Completed/Met Date Met:  03/20/14 Pt denies A/VH this shift     

## 2014-03-20 NOTE — [facility] Group Notes (Signed)
[facility] Group Notes:  (Nursing/MHT/Case Management/Adjunct)  Date:  03/20/2014  Time:  11:16 AM  Type of Therapy:  Nurse Education  Participation Level:  Active  Participation Quality:  Attentive and Redirectable  Affect:  Blunted  Cognitive:  Alert  Insight:  Lacking  Engagement in Group:  Engaged and Supportive  Modes of Intervention:  Discussion and Education  Summary of Progress/Problems:  The purpose of this group is to focus on leisure and lifestyle changes that patient's can make for themselves. Alos, patient's are asked to think of a goal for themselves either short term or a longer term goal. Holly [name] reports that her first goal after discharge is to go home and relax. Patient participated in stretching exercises and was minimally distracting but very redirectable.  Ned ClinesDopson, Samad Thon E 03/20/2014, 11:16 AM
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
## 2014-03-25 NOTE — Progress Notes (Signed)
Patient Discharge Instructions:  After Visit Summary (AVS):   Faxed to:  03/25/14 Discharge Summary Note:   Faxed to:  03/25/14 Psychiatric Admission Assessment Note:   Faxed to:  03/25/14 Suicide Risk Assessment - Discharge Assessment:   Faxed to:  03/25/14 Faxed/Sent to the Next Level Care provider:  03/25/14 Faxed to [facility] @ [phone]  Jerelene ReddenSheena E [place], 03/25/2014, 2:35 PM

## 2014-10-01 ENCOUNTER — Emergency Department (HOSPITAL_COMMUNITY)
Admission: EM | Admit: 2014-10-01 | Discharge: 2014-10-02 | Disposition: A | Discharge status: Home / Self Care | Payer: Medicaid Other | Attending: Emergency Medicine | Admitting: Emergency Medicine

## 2014-10-01 ENCOUNTER — Encounter (HOSPITAL_COMMUNITY): Payer: Self-pay

## 2014-10-01 DIAGNOSIS — E669 Obesity, unspecified: Secondary | ICD-10-CM | POA: Insufficient documentation

## 2014-10-01 DIAGNOSIS — Y939 Activity, unspecified: Secondary | ICD-10-CM | POA: Insufficient documentation

## 2014-10-01 DIAGNOSIS — X788XXA Intentional self-harm by other sharp object, initial encounter: Secondary | ICD-10-CM | POA: Diagnosis not present

## 2014-10-01 DIAGNOSIS — Z7951 Long term (current) use of inhaled steroids: Secondary | ICD-10-CM | POA: Diagnosis not present

## 2014-10-01 DIAGNOSIS — F4310 Post-traumatic stress disorder, unspecified: Secondary | ICD-10-CM | POA: Diagnosis not present

## 2014-10-01 DIAGNOSIS — E039 Hypothyroidism, unspecified: Secondary | ICD-10-CM | POA: Diagnosis not present

## 2014-10-01 DIAGNOSIS — E119 Type 2 diabetes mellitus without complications: Secondary | ICD-10-CM | POA: Insufficient documentation

## 2014-10-01 DIAGNOSIS — Y929 Unspecified place or not applicable: Secondary | ICD-10-CM | POA: Insufficient documentation

## 2014-10-01 DIAGNOSIS — Z792 Long term (current) use of antibiotics: Secondary | ICD-10-CM | POA: Insufficient documentation

## 2014-10-01 DIAGNOSIS — S60812A Abrasion of left wrist, initial encounter: Secondary | ICD-10-CM | POA: Insufficient documentation

## 2014-10-01 DIAGNOSIS — F489 Nonpsychotic mental disorder, unspecified: Secondary | ICD-10-CM | POA: Diagnosis not present

## 2014-10-01 DIAGNOSIS — F1310 Sedative, hypnotic or anxiolytic abuse, uncomplicated: Secondary | ICD-10-CM | POA: Insufficient documentation

## 2014-10-01 DIAGNOSIS — F250 Schizoaffective disorder, bipolar type: Secondary | ICD-10-CM | POA: Diagnosis present

## 2014-10-01 DIAGNOSIS — Y998 Other external cause status: Secondary | ICD-10-CM | POA: Diagnosis not present

## 2014-10-01 DIAGNOSIS — Z3202 Encounter for pregnancy test, result negative: Secondary | ICD-10-CM | POA: Insufficient documentation

## 2014-10-01 DIAGNOSIS — R45851 Suicidal ideations: Secondary | ICD-10-CM | POA: Diagnosis present

## 2014-10-01 DIAGNOSIS — F319 Bipolar disorder, unspecified: Secondary | ICD-10-CM | POA: Insufficient documentation

## 2014-10-01 DIAGNOSIS — IMO0002 Reserved for concepts with insufficient information to code with codable children: Secondary | ICD-10-CM

## 2014-10-01 DIAGNOSIS — Z79899 Other long term (current) drug therapy: Secondary | ICD-10-CM | POA: Diagnosis not present

## 2014-10-01 DIAGNOSIS — Z7289 Other problems related to lifestyle: Secondary | ICD-10-CM

## 2014-10-01 LAB — COMPREHENSIVE METABOLIC PANEL
ALT: 10 U/L — ABNORMAL LOW (ref 14–54)
AST: 20 U/L (ref 15–41)
Albumin: 3.8 g/dL (ref 3.5–5.0)
Alkaline Phosphatase: 125 U/L (ref 38–126)
Anion gap: 10 (ref 5–15)
BUN: 11 mg/dL (ref 6–20)
CO2: 21 mmol/L — ABNORMAL LOW (ref 22–32)
Calcium: 8.9 mg/dL (ref 8.9–10.3)
Chloride: 111 mmol/L (ref 101–111)
Creatinine, Ser: 0.79 mg/dL (ref 0.44–1.00)
GFR calc Af Amer: >60 mL/min (ref >60)
GFR calc non Af Amer: >60 mL/min (ref >60)
Glucose, Bld: 149 mg/dL — ABNORMAL HIGH (ref 65–99)
Potassium: 3.3 mmol/L — ABNORMAL LOW (ref 3.5–5.1)
Sodium: 142 mmol/L (ref 135–145)
Total Bilirubin: 0.5 mg/dL (ref 0.3–1.2)
Total Protein: 7.4 g/dL (ref 6.5–8.1)

## 2014-10-01 LAB — CBC
HCT: 37.6 % (ref 36.0–46.0)
Hemoglobin: 12.3 g/dL (ref 12.0–15.0)
MCH: 31.3 pg (ref 26.0–34.0)
MCHC: 32.7 g/dL (ref 30.0–36.0)
MCV: 95.7 fL (ref 78.0–100.0)
Platelets: 350 10*3/uL (ref 150–400)
RBC: 3.93 MIL/uL (ref 3.87–5.11)
RDW: 12.9 % (ref 11.5–15.5)
WBC: 8.6 10*3/uL (ref 4.0–10.5)

## 2014-10-01 LAB — RAPID URINE DRUG SCREEN, HOSP PERFORMED
Amphetamines: NOT DETECTED
Barbiturates: NOT DETECTED
Benzodiazepines: POSITIVE — AB
Cocaine: NOT DETECTED
Opiates: NOT DETECTED
Tetrahydrocannabinol: NOT DETECTED

## 2014-10-01 LAB — SALICYLATE LEVEL: Salicylate Lvl: <4 mg/dL (ref 2.8–30.0)

## 2014-10-01 LAB — LITHIUM LEVEL: Lithium Lvl: 0.77 mmol/L (ref 0.60–1.20)

## 2014-10-01 LAB — ETHANOL: Alcohol, Ethyl (B): <5 mg/dL (ref <5)

## 2014-10-01 LAB — POC URINE PREG, ED: Preg Test, Ur: NEGATIVE

## 2014-10-01 LAB — ACETAMINOPHEN LEVEL: Acetaminophen (Tylenol), Serum: <10 ug/mL — ABNORMAL LOW (ref 10–30)

## 2014-10-01 MED ORDER — NICOTINE 21 MG/24HR TD PT24: 21.0000 mg | MEDICATED_PATCH | Freq: Every day | TRANSDERMAL | Status: DC | PRN

## 2014-10-01 MED ORDER — ZOLPIDEM TARTRATE 5 MG PO TABS
5.0000 mg | ORAL_TABLET | Freq: Every evening | ORAL | Status: DC | PRN
  Administered 2014-10-01: 5 mg via ORAL
  Filled 2014-10-01: qty 1

## 2014-10-01 MED ORDER — ALUM & MAG HYDROXIDE-SIMETH 200-200-20 MG/5ML PO SUSP: 30.0000 mL | ORAL | Status: DC | PRN

## 2014-10-01 MED ORDER — LEVOTHYROXINE SODIUM 100 MCG PO TABS
100.0000 ug | ORAL_TABLET | Freq: Every day | ORAL | Status: DC
  Administered 2014-10-02: 100 ug via ORAL
  Filled 2014-10-01 (×2): qty 1

## 2014-10-01 MED ORDER — METFORMIN HCL ER 500 MG PO TB24
500.0000 mg | ORAL_TABLET | Freq: Every day | ORAL | Status: DC
  Administered 2014-10-02: 500 mg via ORAL
  Filled 2014-10-01 (×2): qty 1

## 2014-10-01 MED ORDER — LITHIUM CARBONATE ER 450 MG PO TBCR
450.0000 mg | EXTENDED_RELEASE_TABLET | Freq: Every morning | ORAL | Status: DC
  Administered 2014-10-02: 450 mg via ORAL
  Filled 2014-10-01: qty 1

## 2014-10-01 MED ORDER — LITHIUM CARBONATE ER 450 MG PO TBCR
900.0000 mg | EXTENDED_RELEASE_TABLET | Freq: Every day | ORAL | Status: DC
  Administered 2014-10-01: 900 mg via ORAL
  Filled 2014-10-01 (×2): qty 2

## 2014-10-01 MED ORDER — IBUPROFEN 200 MG PO TABS: 600.0000 mg | ORAL_TABLET | Freq: Three times a day (TID) | ORAL | Status: DC | PRN

## 2014-10-01 MED ORDER — CARBAMAZEPINE ER 200 MG PO TB12
200.0000 mg | ORAL_TABLET | Freq: Three times a day (TID) | ORAL | Status: DC
  Administered 2014-10-01 – 2014-10-02 (×3): 200 mg via ORAL
  Filled 2014-10-01 (×4): qty 1

## 2014-10-01 MED ORDER — OLANZAPINE 5 MG PO TBDP
15.0000 mg | ORAL_TABLET | Freq: Every day | ORAL | Status: DC
  Administered 2014-10-01: 15 mg via ORAL
  Filled 2014-10-01 (×2): qty 1

## 2014-10-01 MED ORDER — ONDANSETRON HCL 4 MG PO TABS: 4.0000 mg | ORAL_TABLET | Freq: Three times a day (TID) | ORAL | Status: DC | PRN

## 2014-10-01 MED ORDER — PANTOPRAZOLE SODIUM 40 MG PO TBEC
40.0000 mg | DELAYED_RELEASE_TABLET | Freq: Every day | ORAL | Status: DC
  Administered 2014-10-01 – 2014-10-02 (×2): 40 mg via ORAL
  Filled 2014-10-01 (×2): qty 1

## 2014-10-01 MED ORDER — ACETAMINOPHEN 325 MG PO TABS: 650.0000 mg | ORAL_TABLET | ORAL | Status: DC | PRN

## 2014-10-01 MED ORDER — BACITRACIN ZINC 500 UNIT/GM EX OINT
TOPICAL_OINTMENT | CUTANEOUS | Status: AC
  Administered 2014-10-01: 16:00:00
  Filled 2014-10-01: qty 0.9

## 2014-10-01 MED ORDER — FLUOXETINE HCL 20 MG PO CAPS
20.0000 mg | ORAL_CAPSULE | Freq: Every day | ORAL | Status: DC
  Administered 2014-10-02: 20 mg via ORAL
  Filled 2014-10-01: qty 1
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
## 2014-10-01 NOTE — ED Notes (Signed)
Bed: [facility] Expected date:  Expected time:  Means of arrival:  Comments: Hold for triage 4 
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
## 2014-10-01 NOTE — BHH Counselor (Signed)
Per Catha NottinghamJamison, patient will be re-assessed in the morning for a final disposition.

## 2014-10-01 NOTE — ED Notes (Addendum)
With the Pt's permission, this RN called Pt's "[facility] Mom" Violet Baldyeresa Amusan to notify her that the Pt is here.  She reports that someone can come and pick up the Pt should she be discharged.    Rosey Batheresa Amusan:[phone]

## 2014-10-01 NOTE — BHH Counselor (Signed)
Writer spoke to SilverstreetLisa and informed her that has already been assessed and the new consult needs to be taken out.
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
## 2014-10-01 NOTE — ED Notes (Signed)
Patient has one bag of belongings in locker 28. 

## 2014-10-01 NOTE — ED Notes (Signed)
Bed: [id number] Expected date:  Expected time:  Means of arrival:  Comments: Margo AyeHall B

## 2014-10-02 DIAGNOSIS — F250 Schizoaffective disorder, bipolar type: Secondary | ICD-10-CM | POA: Diagnosis not present

## 2014-10-02 DIAGNOSIS — F489 Nonpsychotic mental disorder, unspecified: Secondary | ICD-10-CM | POA: Diagnosis not present

## 2014-10-02 DIAGNOSIS — IMO0002 Reserved for concepts with insufficient information to code with codable children: Secondary | ICD-10-CM | POA: Insufficient documentation

## 2014-10-02 DIAGNOSIS — Z7289 Other problems related to lifestyle: Secondary | ICD-10-CM | POA: Insufficient documentation

## 2014-10-02 MED ORDER — LITHIUM CARBONATE ER 450 MG PO TBCR: 900.0000 mg | EXTENDED_RELEASE_TABLET | Freq: Every day | ORAL | Status: DC

## 2014-10-02 MED ORDER — LITHIUM CARBONATE ER 450 MG PO TBCR: 450.0000 mg | EXTENDED_RELEASE_TABLET | Freq: Every morning | ORAL | Status: DC

## 2014-10-02 MED ORDER — TRAZODONE HCL 50 MG PO TABS: 50.0000 mg | ORAL_TABLET | Freq: Every evening | ORAL | Status: DC | PRN

## 2014-10-02 MED ORDER — CARBAMAZEPINE ER 200 MG PO TB12: 200.0000 mg | ORAL_TABLET | Freq: Three times a day (TID) | ORAL | Status: DC
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
## 2014-10-02 NOTE — [facility] Suicide Risk Assessment (Cosign Needed)
Suicide Risk Assessment  Discharge Assessment   [facility]   Demographic Factors:  Adolescent or young adult, Caucasian, Low socioeconomic status and Unemployed  Total Time spent with patient: 20 minutes  Musculoskeletal: Strength & Muscle Tone: within normal limits Gait & Station: normal Patient leans: N/A  Psychiatric Specialty Exam:     Blood pressure 116/62, pulse 104, temperature 98.4 F (36.9 C), temperature source Oral, resp. rate 18, SpO2 99 %.There is no weight on file to calculate BMI.  General Appearance: Casual  Eye Contact::  Good  Speech:  Clear and Coherent and Normal Rate409  Volume:  Normal  Mood:  Dysphoric  Affect:  Congruent  Thought Process:  Coherent, Goal Directed and Intact  Orientation:  Full (Time, Place, and Person)  Thought Content:  WDL  Suicidal Thoughts:  No  Homicidal Thoughts:  No  Memory:  Immediate;   Good Recent;   Good Remote;   Good  Judgement:  Fair  Insight:  Fair  Psychomotor Activity:  Normal  Concentration:  Fair  Recall:  NA  Fund of Knowledge:Fair  Language: Good  Akathisia:  NA  Handed:  Right  AIMS (if indicated):     Assets:  Desire for Improvement  Sleep:     Cognition: WNL  ADL's:  Intact      Has this patient used any form of tobacco in the last 30 days? (Cigarettes, Smokeless Tobacco, Cigars, and/or Pipes) N/A  Mental Status Per Nursing Assessment::   On Admission:     Current Mental Status by Physician: NA  Loss Factors: NA  Historical Factors: Prior suicide attempts  Risk Reduction Factors:   Living with another person, especially a relative  Continued Clinical Symptoms:  Bipolar Disorder:   Depressive phase  Cognitive Features That Contribute To Risk:  Polarized thinking    Suicide Risk:  Minimal: No identifiable suicidal ideation.  Patients presenting with no risk factors but with morbid ruminations; may be classified as minimal risk based on the severity of the  depressive symptoms  Principal Problem: <principal problem not specified> Discharge Diagnoses:  Patient Active Problem List   Diagnosis Date Noted  . Schizoaffective disorder, bipolar type [F25.0]   . 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 Psychosis [F29] 03/18/2014  . Bipolar disorder, curr episode mixed, severe, with psychotic features [F31.64] 12/14/2013  . ADHD (attention deficit hyperactivity disorder) [F90.9] 01/03/2013  . PTSD (post-traumatic stress disorder) [F43.10] 01/03/2013  . Mental retardation [F79] 01/03/2013  . Agitation [R45.1] 01/03/2013      Plan Of Care/Follow-up recommendations:  Activity:  AS TOLERATED Diet:  REGULAR  Is patient on multiple antipsychotic therapies at discharge:  No   Has Patient had three or more failed trials of antipsychotic monotherapy by history:  No  Recommended Plan for Multiple Antipsychotic Therapies: NA    Thuan Tippett C   PMHNP-BC 10/02/2014, 3:51 PM

## 2014-10-02 NOTE — Progress Notes (Signed)
CSW called providers house Ms. Rosey Batheresa at [phone] regarding pt disposition however had to leave message.  CSW called pt guardian, Roxana HiresMichelle Juchatz and left message at [phone].   Olga CoasterKristen Cathie Bonnell, LCSW  Clinical Social Work  [facility] [phone]

## 2015-02-19 ENCOUNTER — Emergency Department (HOSPITAL_COMMUNITY)
Admission: EM | Admit: 2015-02-19 | Discharge: 2015-02-20 | Disposition: A | Discharge status: Other Acute Inpatient Hospital | Payer: Medicaid Other | Attending: Emergency Medicine | Admitting: Emergency Medicine

## 2015-02-19 ENCOUNTER — Encounter (HOSPITAL_COMMUNITY): Payer: Self-pay

## 2015-02-19 DIAGNOSIS — F250 Schizoaffective disorder, bipolar type: Secondary | ICD-10-CM | POA: Diagnosis present

## 2015-02-19 DIAGNOSIS — Y998 Other external cause status: Secondary | ICD-10-CM | POA: Diagnosis not present

## 2015-02-19 DIAGNOSIS — Y9389 Activity, other specified: Secondary | ICD-10-CM | POA: Insufficient documentation

## 2015-02-19 DIAGNOSIS — E039 Hypothyroidism, unspecified: Secondary | ICD-10-CM | POA: Diagnosis not present

## 2015-02-19 DIAGNOSIS — Z79899 Other long term (current) drug therapy: Secondary | ICD-10-CM | POA: Insufficient documentation

## 2015-02-19 DIAGNOSIS — Z7951 Long term (current) use of inhaled steroids: Secondary | ICD-10-CM | POA: Diagnosis not present

## 2015-02-19 DIAGNOSIS — Z3202 Encounter for pregnancy test, result negative: Secondary | ICD-10-CM | POA: Insufficient documentation

## 2015-02-19 DIAGNOSIS — X780XXA Intentional self-harm by sharp glass, initial encounter: Secondary | ICD-10-CM | POA: Diagnosis not present

## 2015-02-19 DIAGNOSIS — E119 Type 2 diabetes mellitus without complications: Secondary | ICD-10-CM | POA: Insufficient documentation

## 2015-02-19 DIAGNOSIS — Y9289 Other specified places as the place of occurrence of the external cause: Secondary | ICD-10-CM | POA: Insufficient documentation

## 2015-02-19 DIAGNOSIS — E669 Obesity, unspecified: Secondary | ICD-10-CM | POA: Insufficient documentation

## 2015-02-19 DIAGNOSIS — S50812A Abrasion of left forearm, initial encounter: Secondary | ICD-10-CM | POA: Diagnosis not present

## 2015-02-19 DIAGNOSIS — F329 Major depressive disorder, single episode, unspecified: Secondary | ICD-10-CM | POA: Insufficient documentation

## 2015-02-19 DIAGNOSIS — F909 Attention-deficit hyperactivity disorder, unspecified type: Secondary | ICD-10-CM | POA: Diagnosis present

## 2015-02-19 DIAGNOSIS — F4310 Post-traumatic stress disorder, unspecified: Secondary | ICD-10-CM | POA: Diagnosis present

## 2015-02-19 DIAGNOSIS — R45851 Suicidal ideations: Secondary | ICD-10-CM | POA: Diagnosis present

## 2015-02-19 DIAGNOSIS — F32A Depression, unspecified: Secondary | ICD-10-CM

## 2015-02-19 LAB — CBC
HCT: 36.3 % (ref 36.0–46.0)
Hemoglobin: 11.9 g/dL — ABNORMAL LOW (ref 12.0–15.0)
MCH: 31.4 pg (ref 26.0–34.0)
MCHC: 32.8 g/dL (ref 30.0–36.0)
MCV: 95.8 fL (ref 78.0–100.0)
Platelets: 291 10*3/uL (ref 150–400)
RBC: 3.79 MIL/uL — ABNORMAL LOW (ref 3.87–5.11)
RDW: 12.4 % (ref 11.5–15.5)
WBC: 7.6 10*3/uL (ref 4.0–10.5)

## 2015-02-19 LAB — RAPID URINE DRUG SCREEN, HOSP PERFORMED
Amphetamines: NOT DETECTED
Barbiturates: NOT DETECTED
Benzodiazepines: NOT DETECTED
Cocaine: NOT DETECTED
Opiates: NOT DETECTED
Tetrahydrocannabinol: NOT DETECTED

## 2015-02-19 LAB — COMPREHENSIVE METABOLIC PANEL
ALT: 7 U/L — ABNORMAL LOW (ref 14–54)
AST: 16 U/L (ref 15–41)
Albumin: 3.8 g/dL (ref 3.5–5.0)
Alkaline Phosphatase: 118 U/L (ref 38–126)
Anion gap: 7 (ref 5–15)
BUN: 7 mg/dL (ref 6–20)
CO2: 25 mmol/L (ref 22–32)
Calcium: 9.2 mg/dL (ref 8.9–10.3)
Chloride: 107 mmol/L (ref 101–111)
Creatinine, Ser: 0.61 mg/dL (ref 0.44–1.00)
GFR calc Af Amer: >60 mL/min (ref >60)
GFR calc non Af Amer: >60 mL/min (ref >60)
Glucose, Bld: 96 mg/dL (ref 65–99)
Potassium: 3.9 mmol/L (ref 3.5–5.1)
Sodium: 139 mmol/L (ref 135–145)
Total Bilirubin: <0.1 mg/dL — ABNORMAL LOW (ref 0.3–1.2)
Total Protein: 7.6 g/dL (ref 6.5–8.1)

## 2015-02-19 LAB — ACETAMINOPHEN LEVEL: Acetaminophen (Tylenol), Serum: <10 ug/mL — ABNORMAL LOW (ref 10–30)

## 2015-02-19 LAB — I-STAT BETA HCG BLOOD, ED (MC, WL, AP ONLY): I-stat hCG, quantitative: <5 m[IU]/mL (ref <5)

## 2015-02-19 LAB — LITHIUM LEVEL: Lithium Lvl: 1.16 mmol/L (ref 0.60–1.20)

## 2015-02-19 LAB — ETHANOL: Alcohol, Ethyl (B): <5 mg/dL (ref <5)

## 2015-02-19 LAB — SALICYLATE LEVEL: Salicylate Lvl: <4 mg/dL (ref 2.8–30.0)

## 2015-02-19 MED ORDER — CHLORPROMAZINE HCL 25 MG PO TABS
100.0000 mg | ORAL_TABLET | Freq: Every day | ORAL | Status: DC
  Administered 2015-02-19: 100 mg via ORAL
  Filled 2015-02-19: qty 4

## 2015-02-19 MED ORDER — PANTOPRAZOLE SODIUM 40 MG PO TBEC
40.0000 mg | DELAYED_RELEASE_TABLET | Freq: Every day | ORAL | Status: DC
  Administered 2015-02-19 – 2015-02-20 (×2): 40 mg via ORAL
  Filled 2015-02-19 (×2): qty 1

## 2015-02-19 MED ORDER — CARBAMAZEPINE ER 200 MG PO TB12
200.0000 mg | ORAL_TABLET | Freq: Three times a day (TID) | ORAL | Status: DC
  Administered 2015-02-19 – 2015-02-20 (×3): 200 mg via ORAL
  Filled 2015-02-19 (×5): qty 1

## 2015-02-19 MED ORDER — METHYLPHENIDATE HCL ER 10 MG PO TBCR
27.0000 mg | EXTENDED_RELEASE_TABLET | Freq: Every day | ORAL | Status: DC
  Filled 2015-02-19: qty 1

## 2015-02-19 MED ORDER — FLUOXETINE HCL 20 MG PO CAPS
20.0000 mg | ORAL_CAPSULE | Freq: Every day | ORAL | Status: DC
  Administered 2015-02-20: 20 mg via ORAL
  Filled 2015-02-19: qty 1

## 2015-02-19 MED ORDER — ACETAMINOPHEN 500 MG PO TABS: 500.0000 mg | ORAL_TABLET | Freq: Four times a day (QID) | ORAL | Status: DC | PRN

## 2015-02-19 MED ORDER — LORAZEPAM 1 MG PO TABS: 1.0000 mg | ORAL_TABLET | Freq: Four times a day (QID) | ORAL | Status: DC | PRN

## 2015-02-19 MED ORDER — METFORMIN HCL ER 500 MG PO TB24
500.0000 mg | ORAL_TABLET | Freq: Every day | ORAL | Status: DC
  Administered 2015-02-20: 500 mg via ORAL
  Filled 2015-02-19 (×2): qty 1

## 2015-02-19 MED ORDER — OLANZAPINE 10 MG PO TABS
10.0000 mg | ORAL_TABLET | Freq: Three times a day (TID) | ORAL | Status: DC
  Administered 2015-02-19 – 2015-02-20 (×3): 10 mg via ORAL
  Filled 2015-02-19 (×3): qty 1

## 2015-02-19 MED ORDER — LEVOTHYROXINE SODIUM 100 MCG PO TABS
100.0000 ug | ORAL_TABLET | Freq: Every day | ORAL | Status: DC
  Administered 2015-02-20: 100 ug via ORAL
  Filled 2015-02-19 (×2): qty 1

## 2015-02-19 MED ORDER — LITHIUM CARBONATE ER 450 MG PO TBCR
900.0000 mg | EXTENDED_RELEASE_TABLET | Freq: Every day | ORAL | Status: DC
Start: 2015-02-19 — End: 2015-02-20
  Administered 2015-02-19: 900 mg via ORAL
  Filled 2015-02-19 (×2): qty 2

## 2015-02-19 MED ORDER — FLUTICASONE PROPIONATE 50 MCG/ACT NA SUSP
1.0000 | Freq: Every day | NASAL | Status: DC | PRN
  Filled 2015-02-19: qty 16

## 2015-02-19 MED ORDER — LITHIUM CARBONATE ER 450 MG PO TBCR
450.0000 mg | EXTENDED_RELEASE_TABLET | Freq: Every morning | ORAL | Status: DC
  Administered 2015-02-20: 450 mg via ORAL
  Filled 2015-02-19: qty 1
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
## 2015-02-19 NOTE — ED Provider Notes (Signed)
CSN: [id number]     Arrival date & time 02/19/15  1244 History   First MD Initiated Contact with Patient 02/19/15 1258     Chief Complaint  Patient presents with  . Suicidal  . Extremity Laceration     (Consider location/radiation/quality/duration/timing/severity/associated sxs/prior Treatment) HPI Comments: Patient presents to the ER for evaluation of depression. Patient reports that she cut her left arm multiple times. She has been expressing increased depression over multiple social stressors including being unable to talk to her mother for the last 3 weeks. She cut herself multiple times with a piece of broken glass. She does report a previous history of cutting. She says she wanted to kill herself, but could not cut her arm deep enough today. She is still very depressed and having some thoughts of harming herself.   Past Medical History  Diagnosis Date  . Bipolar 1 disorder (HCC)   . Gluten free diet   . ADHD (attention deficit hyperactivity disorder)   . PTSD (post-traumatic stress disorder)   . 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 Mood disorder (HCC)   . Diabetes mellitus   . Obesity   . Depression   . Hypothyroidism    Past Surgical History  Procedure Laterality Date  . None     History reviewed. No pertinent family history. Social History  Substance Use Topics  . Smoking status: Never Smoker   . Smokeless tobacco: Never Used  . Alcohol Use: No   OB History    No data available     Review of Systems  Psychiatric/Behavioral: Positive for suicidal ideas, self-injury and dysphoric mood.  All other systems reviewed and are negative.     Allergies  Gluten meal and Lactose intolerance (gi)  Home Medications   Prior to Admission medications   Medication Sig Start Date End Date Taking? Authorizing Provider  acetaminophen (TYLENOL) 500 MG tablet Take 500-1,000 mg by mouth every 6 (six) hours as needed for moderate pain or headache.   Yes Historical Provider, MD   acidophilus (RISAQUAD) CAPS capsule Take 1 capsule by mouth 3 (three) times daily as needed. Patient taking differently: Take 1 capsule by mouth 3 (three) times daily as needed (lactose intolerence).  12/14/13  Yes Charm Rings, NP  adapalene (DIFFERIN) 0.1 % cream Apply 1 application topically as needed (acne).   Yes Historical Provider, MD  bifidobacterium infantis (ALIGN) capsule Take 1 capsule by mouth at bedtime. 12/14/13  Yes Charm Rings, NP  carbamazepine (TEGRETOL XR) 200 MG 12 hr tablet Take 1 tablet (200 mg total) by mouth 3 (three) times daily. 10/02/14  Yes Earney Navy, NP  chlorproMAZINE (THORAZINE) 100 MG tablet Take 100 mg by mouth at bedtime.   Yes Historical Provider, MD  clindamycin-benzoyl peroxide (BENZACLIN) gel Apply 1 application topically as needed (acne).   Yes Historical Provider, MD  FLUoxetine (PROZAC) 20 MG capsule Take 1 capsule (20 mg total) by mouth daily. 03/20/14  Yes Adonis Brook, NP  fluticasone (FLONASE) 50 MCG/ACT nasal spray Place 1 spray into both nostrils daily as needed for allergies.  08/11/14  Yes Historical Provider, MD  levothyroxine (SYNTHROID, LEVOTHROID) 100 MCG tablet Take 1 tablet (100 mcg total) by mouth daily before breakfast. 12/14/13  Yes Charm Rings, NP  lithium carbonate (ESKALITH) 450 MG CR tablet Take 1 tablet (450 mg total) by mouth every morning. 10/02/14  Yes Earney Navy, NP  lithium carbonate (ESKALITH) 450 MG CR tablet Take 2  tablets (900 mg total) by mouth at bedtime. 10/02/14  Yes Earney NavyJosephine C Onuoha, NP  LORazepam (ATIVAN) 1 MG tablet Take 1 mg by mouth at bedtime.   Yes Historical Provider, MD  medroxyPROGESTERone (DEPO-PROVERA) 150 MG/ML injection Inject 1 mL (150 mg total) into the muscle every 3 (three) months. 12/14/13  Yes Charm RingsJamison Y Lord, NP  metFORMIN (GLUCOPHAGE) 1000 MG tablet Take 1,000 mg by mouth at bedtime.   Yes Historical Provider, MD  metFORMIN (GLUMETZA) 500 MG (MOD) 24 hr tablet Take 1 tablet (500 mg  total) by mouth daily with breakfast. 12/14/13  Yes Charm RingsJamison Y Lord, NP  methylphenidate 27 MG PO CR tablet Take 27 mg by mouth every morning.   Yes Historical Provider, MD  OLANZapine (ZYPREXA) 10 MG tablet Take 10 mg by mouth 3 (three) times daily.   Yes Historical Provider, MD  OLANZapine zydis (ZYPREXA) 15 MG disintegrating tablet Take 1 tablet (15 mg total) by mouth at bedtime. Patient taking differently: Take 10 mg by mouth daily as needed (aggitation).  03/20/14  Yes Adonis BrookSheila Agustin, NP  omeprazole (PRILOSEC) 20 MG capsule Take 1 capsule (20 mg total) by mouth every morning. 12/14/13  Yes Charm RingsJamison Y Lord, NP   BP 113/65 mmHg  Pulse 92  Temp(Src) 98 F (36.7 C) (Oral)  Resp 16  SpO2 99% Physical Exam  Constitutional: She is oriented to person, place, and time. She appears well-developed and well-nourished. No distress.  HENT:  Head: Normocephalic and atraumatic.  Right Ear: Hearing normal.  Left Ear: Hearing normal.  Nose: Nose normal.  Mouth/Throat: Oropharynx is clear and moist and mucous membranes are normal.  Eyes: Conjunctivae and EOM are normal. Pupils are equal, round, and reactive to light.  Neck: Normal range of motion. Neck supple.  Cardiovascular: Regular rhythm, S1 normal and S2 normal.  Exam reveals no gallop and no friction rub.   No murmur heard. Pulmonary/Chest: Effort normal and breath sounds normal. No respiratory distress. She exhibits no tenderness.  Abdominal: Soft. Normal appearance and bowel sounds are normal. There is no hepatosplenomegaly. There is no tenderness. There is no rebound, no guarding, no tenderness at McBurney's point and negative Murphy's sign. No hernia.  Musculoskeletal: Normal range of motion.  Neurological: She is alert and oriented to person, place, and time. She has normal strength. No cranial nerve deficit or sensory deficit. Coordination normal. GCS eye subscore is 4. GCS verbal subscore is 5. GCS motor subscore is 6.  Skin: Skin is warm and  dry. Abrasion noted. No rash noted. No cyanosis.  Multiple linear abrasions volar aspect left forearm  Psychiatric: She has a normal mood and affect. Her speech is normal and behavior is normal. Thought content normal.  Nursing note and vitals reviewed.   ED Course  Procedures (including critical care time) Labs Review Labs Reviewed  CBC - Abnormal; Notable for the following:    RBC 3.79 (*)    Hemoglobin 11.9 (*)    All other components within normal limits  URINE RAPID DRUG SCREEN, HOSP PERFORMED  COMPREHENSIVE METABOLIC PANEL  ETHANOL  SALICYLATE LEVEL  ACETAMINOPHEN LEVEL  I-STAT BETA HCG BLOOD, ED ([facility], [facility], [facility] ONLY)    Imaging Review No results found. I have personally reviewed and evaluated these images and lab results as part of my medical decision-making.   EKG Interpretation None      MDM   Final diagnoses:  None   depression  Presents to the ER for evaluation of depression. Patient reports worsening  depression and she admits to cutting her left forearm multiple times in a suicidal gesture earlier today. She continues to have severe depression and will require psychiatric evaluation. Wounds on her arm or superficial and did not require repair.   Gilda Crease, MD 02/19/15 [phone]

## 2015-02-19 NOTE — ED Notes (Signed)
Have  black pants, purple shirt, pink sport bra, pink/white NB shoe, sock with blood on top. All items place in PT belonging bag behind nurse station

## 2015-02-19 NOTE — BH Assessment (Signed)
[facility] Assessment Progress Note  The following facilities have been contacted to seek placement for this pt, with results as noted:  Beds available, information sent, decision pending:  [name] [facility]   No beds available, but accepting referrals for future consideration; information sent:  [facility]   At capacity:  [facility]    Doylene Canninghomas Petronella Shuford, [place] Triage Specialist [phone]

## 2015-02-19 NOTE — ED Notes (Signed)
Bed: [id number] Expected date:  Expected time:  Means of arrival:  Comments: Ems-si/self mutilation
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
## 2015-02-19 NOTE — ED Notes (Signed)
Awake. Verbally responsive. A/O x4. Resp even and unlabored. No audible adventitious breath sounds noted. ABC's intact. Pt reported having feelings of depression and going through a lot of things for past 2 weeks. Pt denies audible/visual hallucinations. Pt reported cutting self on LFA with broken watch plastic glass causing superficial abrasion. Pt reported having SI thoughts at this time.

## 2015-02-19 NOTE — ED Notes (Signed)
Pt is alert and oriented. Pt denies SI/HI/AVH. Denies pain. Pt expresses feelings of hopelessness due to "everything going on". Pt is pleasant and interactive. Safety is maintained through q15 minute safety checks.

## 2015-02-19 NOTE — ED Notes (Signed)
Called report to psyche ED nurse. Pt moved to RM 34

## 2015-02-19 NOTE — ED Notes (Signed)
Pt oriented to room and unit.  Pt is pleasant and cooperative.  Pt contracts for safety.  Denies SI, HI, and AVH. Left forearm has multiple superficial scratches up inside of it.  Pt denies pain or discomfort at this time.  15 minute checks and video monitoring are in place.

## 2015-02-19 NOTE — ED Notes (Signed)
Wound care completed to lacerations of left arm.

## 2015-02-20 ENCOUNTER — Inpatient Hospital Stay (HOSPITAL_COMMUNITY)
Admission: AD | Admit: 2015-02-20 | Discharge: 2015-02-24 | DRG: 885 | Disposition: A | Discharge status: Home / Self Care | Payer: Medicaid Other | Source: Intra-hospital | Attending: Psychiatry | Admitting: Psychiatry

## 2015-02-20 ENCOUNTER — Encounter (HOSPITAL_COMMUNITY): Payer: Self-pay | Admitting: *Deleted

## 2015-02-20 DIAGNOSIS — F3164 Bipolar disorder, current episode mixed, severe, with psychotic features: Secondary | ICD-10-CM | POA: Diagnosis not present

## 2015-02-20 DIAGNOSIS — F250 Schizoaffective disorder, bipolar type: Secondary | ICD-10-CM | POA: Diagnosis present

## 2015-02-20 DIAGNOSIS — F70 Mild intellectual disabilities: Secondary | ICD-10-CM | POA: Diagnosis present

## 2015-02-20 DIAGNOSIS — F419 Anxiety disorder, unspecified: Secondary | ICD-10-CM | POA: Diagnosis present

## 2015-02-20 DIAGNOSIS — R45851 Suicidal ideations: Secondary | ICD-10-CM | POA: Diagnosis present

## 2015-02-20 DIAGNOSIS — Z62810 Personal history of physical and sexual abuse in childhood: Secondary | ICD-10-CM | POA: Diagnosis present

## 2015-02-20 DIAGNOSIS — F4310 Post-traumatic stress disorder, unspecified: Secondary | ICD-10-CM | POA: Diagnosis present

## 2015-02-20 DIAGNOSIS — E039 Hypothyroidism, unspecified: Secondary | ICD-10-CM | POA: Diagnosis present

## 2015-02-20 DIAGNOSIS — E119 Type 2 diabetes mellitus without complications: Secondary | ICD-10-CM | POA: Diagnosis present

## 2015-02-20 DIAGNOSIS — F909 Attention-deficit hyperactivity disorder, unspecified type: Secondary | ICD-10-CM | POA: Diagnosis present

## 2015-02-20 DIAGNOSIS — F329 Major depressive disorder, single episode, unspecified: Secondary | ICD-10-CM | POA: Diagnosis not present

## 2015-02-20 DIAGNOSIS — F29 Unspecified psychosis not due to a substance or known physiological condition: Secondary | ICD-10-CM | POA: Diagnosis present

## 2015-02-20 LAB — T4, FREE: Free T4: 0.7 ng/dL (ref 0.61–1.12)

## 2015-02-20 LAB — TSH: TSH: 3.444 u[IU]/mL (ref 0.350–4.500)

## 2015-02-20 LAB — CARBAMAZEPINE LEVEL, TOTAL: Carbamazepine Lvl: 8 ug/mL (ref 4.0–12.0)

## 2015-02-20 LAB — GLUCOSE, CAPILLARY
Glucose-Capillary: 122 mg/dL — ABNORMAL HIGH (ref 65–99)
Glucose-Capillary: 146 mg/dL — ABNORMAL HIGH (ref 65–99)

## 2015-02-20 MED ORDER — OLANZAPINE 5 MG PO TABS
5.0000 mg | ORAL_TABLET | Freq: Three times a day (TID) | ORAL | Status: DC
  Administered 2015-02-20 – 2015-02-23 (×9): 5 mg via ORAL
  Filled 2015-02-20 (×8): qty 1
  Filled 2015-02-20 (×2): qty 2
  Filled 2015-02-20 (×6): qty 1

## 2015-02-20 MED ORDER — FLUTICASONE PROPIONATE 50 MCG/ACT NA SUSP: 1.0000 | Freq: Every day | NASAL | Status: DC | PRN

## 2015-02-20 MED ORDER — LITHIUM CARBONATE ER 450 MG PO TBCR
900.0000 mg | EXTENDED_RELEASE_TABLET | Freq: Every day | ORAL | Status: DC
  Administered 2015-02-20 – 2015-02-23 (×4): 900 mg via ORAL
  Filled 2015-02-20 (×7): qty 2

## 2015-02-20 MED ORDER — METHYLPHENIDATE HCL ER 27 MG PO TB24
27.0000 mg | ORAL_TABLET | Freq: Every day | ORAL | Status: DC
  Administered 2015-02-20: 27 mg via ORAL
  Filled 2015-02-20 (×2): qty 1

## 2015-02-20 MED ORDER — ACETAMINOPHEN 325 MG PO TABS
650.0000 mg | ORAL_TABLET | Freq: Four times a day (QID) | ORAL | Status: DC | PRN
  Administered 2015-02-21 – 2015-02-24 (×5): 650 mg via ORAL
  Filled 2015-02-20 (×5): qty 2

## 2015-02-20 MED ORDER — MEDROXYPROGESTERONE ACETATE 150 MG/ML IM SUSP: 150.0000 mg | INTRAMUSCULAR | Status: DC

## 2015-02-20 MED ORDER — LEVOTHYROXINE SODIUM 100 MCG PO TABS
100.0000 ug | ORAL_TABLET | Freq: Every day | ORAL | Status: DC
  Administered 2015-02-21 – 2015-02-24 (×4): 100 ug via ORAL
  Filled 2015-02-20 (×8): qty 1

## 2015-02-20 MED ORDER — PANTOPRAZOLE SODIUM 40 MG PO TBEC
40.0000 mg | DELAYED_RELEASE_TABLET | Freq: Every day | ORAL | Status: DC
  Administered 2015-02-20 – 2015-02-24 (×5): 40 mg via ORAL
  Filled 2015-02-20 (×9): qty 1

## 2015-02-20 MED ORDER — FLUOXETINE HCL 20 MG PO CAPS
20.0000 mg | ORAL_CAPSULE | Freq: Every day | ORAL | Status: DC
  Administered 2015-02-20 – 2015-02-24 (×5): 20 mg via ORAL
  Filled 2015-02-20 (×9): qty 1

## 2015-02-20 MED ORDER — ALUM & MAG HYDROXIDE-SIMETH 200-200-20 MG/5ML PO SUSP
30.0000 mL | ORAL | Status: DC | PRN
Start: 2015-02-20 — End: 2015-02-24
  Administered 2015-02-21: 30 mL via ORAL
  Filled 2015-02-20: qty 30

## 2015-02-20 MED ORDER — LITHIUM CARBONATE ER 450 MG PO TBCR
450.0000 mg | EXTENDED_RELEASE_TABLET | Freq: Every morning | ORAL | Status: DC
  Administered 2015-02-21 – 2015-02-24 (×4): 450 mg via ORAL
  Filled 2015-02-20 (×7): qty 1

## 2015-02-20 MED ORDER — RISAQUAD PO CAPS
1.0000 | ORAL_CAPSULE | Freq: Three times a day (TID) | ORAL | Status: DC
  Administered 2015-02-20 – 2015-02-24 (×12): 1 via ORAL
  Filled 2015-02-20 (×19): qty 1

## 2015-02-20 MED ORDER — MAGNESIUM HYDROXIDE 400 MG/5ML PO SUSP
30.0000 mL | Freq: Every day | ORAL | Status: DC | PRN
Start: 2015-02-20 — End: 2015-02-24

## 2015-02-20 MED ORDER — METFORMIN HCL 850 MG PO TABS
850.0000 mg | ORAL_TABLET | Freq: Two times a day (BID) | ORAL | Status: DC
  Administered 2015-02-20 – 2015-02-24 (×8): 850 mg via ORAL
  Filled 2015-02-20 (×14): qty 1
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
## 2015-02-20 NOTE — Tx Team (Signed)
  Interdisciplinary Treatment Plan Update (Adult)  Date: 02/20/2015   Time Reviewed: 3:58 PM    Progress in Treatment: Attending groups: Yes. Participating in groups:  Yes. Taking medication as prescribed:  Yes.MD assessing for appropriate medication regimen Tolerating medication:  Yes. Family/Significant othe contact made:  CSW left message for DSS guardian Patient understands diagnosis:  No.CSW assessing Discussing patient identified problems/goals with staff:  Yes. Medical problems stabilized or resolved:  Yes. Denies suicidal/homicidal ideation: No, patient admitted w SI, contracts for safety on unit Issues/concerns per patient self-inventory:  Other:  New problem(s) identified:  Contact guardian, identify current providers and living situation.    Discharge Plan or Barriers: return to current living situation  Reason for Continuation of Hospitalization: Depression Medication stabilization Suicidal ideation  Comments:  Patient has DSS guardian, CSW attempting to contact.  Resident of [facility] in [place], pt expresses frustration at not being able to live on her own or have contact w family. Says she has been in foster care since age 60 with multiple placements and hospitalizations.  Says she has been told that if she came to hospital "one more time" she would lose her current housing and would be homeless, veracity of these ideas needs to be determined.  Pt attends day program per record, unsure where.  CSW will assess and attempt to reconnect w resources.    Estimated length of stay:  5 - 7 days  New goal(s): SI and medication stabilization  Additional Comments:  Patient and CSW reviewed pt's identified goals and treatment plan. Patient verbalized understanding and agreed to treatment plan. CSW will review [facility] "Discharge Process and Patient Involvement" Form w guardian.   Review of initial/current patient goals per problem list:    1.  Goal(s): Patient will participate  in aftercare plan  Met:    Target date: at discharge  As evidenced by: Patient will participate within aftercare plan AEB aftercare provider and housing plan at discharge being identified.  02/20/15:  CSW will contact guardian and assess for appropriate aftercare resources, current resident in ALF.    2.  Goal (s): Patient will exhibit decreased depressive symptoms and suicidal ideations.  Met:     Target date: at discharge  As evidenced by: Patient will utilize self rating of depression at 3 or below and demonstrate decreased signs of depression or be deemed stable for discharge by MD.  10/14:  Patient admitted w SI w plan and depression, cutting, goal not met.       Attendees: Patient:   02/20/2015 2:37 PM   Family:   02/20/2015 2:37 PM   Physician:  Ursula Alert, MD 02/20/2015 2:37 PM   Nursing:   Erin Sons, RN 02/20/2015 2:37 PM   Clinical Social Worker: Edwyna Shell, LCSW 02/20/2015 2:37 PM   Clinical Social Worker:  02/20/2015 2:37 PM   Other:  Gerline Legacy Nurse Case Manager 02/20/2015 2:37 PM   Other:  Lucinda Dell; [facility]  02/20/2015 2:37 PM   Other:   02/20/2015 2:37 PM   Other:  02/20/2015 2:37 PM   Other:  02/20/2015 2:37 PM   Other:  02/20/2015 2:37 PM    02/20/2015 2:37 PM    02/20/2015 2:37 PM    02/20/2015 2:37 PM    02/20/2015 2:37 PM    Scribe for Treatment Team:   Edwyna Shell, LCSW 02/20/2015 2:37 PM

## 2015-02-20 NOTE — BH Assessment (Signed)
B[facility] Assessment Progress Note  Per Thedore MinsMojeed Akintayo, MD, this pt requires psychiatric hospitalization at this time.  Thurman CoyerEric Kaplan, RN, [facility] with a caveat that pt is not to be transported until after 12:00.  Pt has signed Voluntary Admission and Consent for Treatment and signed form [facility].  Pt reports that she [place] the [place] social worker [employer]Michelle Eukats.  This Clinical research associatewriter will attempt to reach her both to notify her of admission and to discuss consent to release information.  Pt's nurse, Carlisle BeersLuann, has been notified of the admission, and agrees to send original paperwork along with pt via [place], and to call report to [phone].  Doylene Canninghomas Matix Henshaw, MA Triage Specialist [phone]   Addendum:  A call was placed to Deneise LeverMichelle Eukats through the main [facility] phone number [phone]).  Call forwarded to voice mail.  Message informs her of [facility] and asks her to call me back to discuss consent to release information.  Doylene Canninghomas Wasif Simonich, MA Triage Specialist [phone]

## 2015-02-20 NOTE — BHH Group Notes (Signed)
BHH LCSW Group Therapy  02/20/2015 1:39 PM   Type of Therapy:  Group Therapy  Participation Level:  Invited, did not attend, new to unit  Modes of Intervention:  Discussion, Exploration, Socialization and Support  Summary of Progress/Problems:  Chaplain led group explored concept of hope and its relevance to mental health recovery.  Patients explored themes including what matters to them personally, how others responses are similar/different, and what they are hopeful for.  Group members discussed relevance of social supports, innter strength and using their own stories to craft a recovery path.    Sallee Langeunningham, Delonna Ney C

## 2015-02-20 NOTE — Progress Notes (Signed)
[facility] Group Notes:  (Nursing/MHT/Case Management/Adjunct)  Date:  02/20/2015  Time:  9:26 PM  Type of Therapy:  Psychoeducational Skills  Participation Level:  Active  Participation Quality:  Appropriate  Affect:  Appropriate  Cognitive:  Appropriate  Insight:  Good  Engagement in Group:  Developing/Improving  Modes of Intervention:  Education  Summary of Progress/Problems: The patient states that her day was okay overall since she met her new peers today.  As a theme for the day, her relapse prevention will involve using her coping skills more often.   Archie Balboa S 02/20/2015, 9:26 PM

## 2015-02-20 NOTE — BHH Counselor (Addendum)
PSA attempted w DSS guardian, unable to reach despite multiple calls.  Spoke w Wes Early, DSS on call CSW, informed that pt was admitted.    Santa GeneraAnne Van Ehlert, LCSW Clinical Social Worker

## 2015-02-20 NOTE — Clinical Social Work Note (Signed)
[place] - lists guardian as [place](Michelle EspinoJuchatz SW) and patient residing at [facility] ([address] [place], [place], [place] [phone]).  CSW will attempt to reach both parties.  Guardianship paperwork requested.  Santa GeneraAnne Corky Blumstein, LCSW Clinical Social Worker

## 2015-02-20 NOTE — Tx Team (Addendum)
Initial Interdisciplinary Treatment Plan   PATIENT STRESSORS: Marital or family conflict Occupational concerns self image   PATIENT STRENGTHS: Active sense of humor Motivation for treatment/growth   PROBLEM LIST: Problem List/Patient Goals Date to be addressed Date deferred Reason deferred Estimated date of resolution  "Help with my thoughts"      "Get help with my voices"      Potential for self harm      Depression      Psychosis                               DISCHARGE CRITERIA:  Ability to meet basic life and health needs Improved stabilization in mood, thinking, and/or behavior Motivation to continue treatment in a less acute level of care Need for constant or close observation no longer present Reduction of life-threatening or endangering symptoms to within safe limits Safe-care adequate arrangements made  PRELIMINARY DISCHARGE PLAN: Attend aftercare/continuing care group Outpatient therapy Return to previous living arrangement  PATIENT/FAMIILY INVOLVEMENT: This treatment plan has been presented to and reviewed with the patient, Holly [name], and/or family member.  The patient and family have been given the opportunity to ask questions and make suggestions.  Areatha Keasraxler, Angela Marie 02/20/2015, 3:16 PM
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
## 2015-02-20 NOTE — Treatment Plan (Signed)
As noted previously, pt has a DSS guardian.  While Mitchel HonourDiane Hayden is not her assigned guardian with DSS she is willing to help with medication consents tonight and any other authorizations that are needed.  She can be reached at [phone].
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
## 2015-02-20 NOTE — ED Notes (Signed)
Pt discharged ambulatory with [place] driver.  All belongings were returned to pt.  Pt was safe and in no distress at discharge.
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
## 2015-02-20 NOTE — Clinical Social Work Note (Signed)
CSW p[place] call SW [place] [facility].  Requested return call.    Santa GeneraAnne Nikiyah Fackler, LCSW Clinical Social Worker

## 2015-02-20 NOTE — Progress Notes (Signed)
Patient's DSS guardian's supervisor, Mitchel Honouriane Hayden called, consent obtained for all admission paperwork. Awaiting approval for psychotropic medications, Ms. Redmond BasemanHayden stated she is not sure what medications she was on prior to admission, so she will call us back with consent on these medications.    Terrye Dombrosky, Wyman SongsterAngela Marie, RN

## 2015-02-21 ENCOUNTER — Encounter (HOSPITAL_COMMUNITY): Payer: Self-pay | Admitting: Psychiatry

## 2015-02-21 DIAGNOSIS — F250 Schizoaffective disorder, bipolar type: Principal | ICD-10-CM

## 2015-02-21 LAB — GLUCOSE, CAPILLARY
Glucose-Capillary: 102 mg/dL — ABNORMAL HIGH (ref 65–99)
Glucose-Capillary: 91 mg/dL (ref 65–99)

## 2015-02-21 LAB — URINALYSIS, ROUTINE W REFLEX MICROSCOPIC
Bilirubin Urine: NEGATIVE
Glucose, UA: NEGATIVE mg/dL
Ketones, ur: NEGATIVE mg/dL
Leukocytes, UA: NEGATIVE
Nitrite: NEGATIVE
Protein, ur: NEGATIVE mg/dL
Specific Gravity, Urine: 1.012 (ref 1.005–1.030)
Urobilinogen, UA: 0.2 mg/dL (ref 0.0–1.0)
pH: 6 (ref 5.0–8.0)

## 2015-02-21 LAB — URINE MICROSCOPIC-ADD ON

## 2015-02-21 LAB — PREGNANCY, URINE: Preg Test, Ur: NEGATIVE

## 2015-02-21 MED ORDER — PRAZOSIN HCL 2 MG PO CAPS
2.0000 mg | ORAL_CAPSULE | Freq: Every day | ORAL | Status: DC
  Administered 2015-02-21 – 2015-02-23 (×3): 2 mg via ORAL
  Filled 2015-02-21 (×5): qty 1

## 2015-02-21 NOTE — [facility] Group Notes (Signed)
[facility] Group Notes:  (Clinical Social Work)  02/21/2015  11:15-12:00PM  Summary of Progress/Problems:   The main focus of today's process group was to discuss patients' feelings related to being hospitalized.  It was agreed in general by the group that it would be preferable to avoid future hospitalizations, and we discussed means of doing that.  As a follow-up, problems with adhering to medication recommendations were discussed.  The patient expressed her primary feeling about being hospitalized is good, that it is a place she can relax and focus on herself while feeling safe.  She participated fully in every aspect of group.  Type of Therapy:  Group Therapy - Process  Participation Level:  Active  Participation Quality:  Attentive, Sharing and Supportive  Affect:  Blunted  Cognitive:  Appropriate and Oriented  Insight:  Developing/Improving  Engagement in Therapy:  Engaged  Modes of Intervention:  Exploration, Discussion  Ambrose MantleMareida Grossman-Orr, LCSW 02/21/2015, 12:57 PM
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
## 2015-02-21 NOTE — [facility] Group Notes (Addendum)

## 2015-02-21 NOTE — Progress Notes (Signed)
D:  Patient's self inventory sheet, patient has fair sleep, no sleep medication given.  Good appetite, normal energy level, poor concentration.  Rated depression, hopeless and anxiety 5. Denied withdrawals.  SI, contracts for safety.  Denied physical problems.  No pain medication given.  Goal is to work on Radiographer, therapeuticself image.  Plans to "talk to Starrif keeling down". No discharge plans. A:  Medications administered per MD orders.  Emotional support and encouragement given patient. R:  Denied HI.  SI, no plan, contracts for safety.  Hears voices of dead people, dad and brother.   Denied visual hallucinations today.  Safety maintained with 15 minute checks.

## 2015-02-21 NOTE — Progress Notes (Signed)
D: Pt is alert and oriented x 4. Pt continues to endorse severe anxiety, depression. Pt with several cut marks bilateral forearms also endorses command auditory hallucinations to self-harm; Pt however, contracted for safety. She states, "I am trying to get rid for the voice, but they just keep coming but I would also let someone know whenever I can control them anymore. Pt continues to be nonviolent.  A: Support, encouragement, and safe environment provided.  15-minute safety checks continue.  R: Pt was med compliant.  Pt did attend wrap-up group. Safety checks continue.

## 2015-02-21 NOTE — Progress Notes (Signed)
UA specimen taken to lab for pick up.

## 2015-02-21 NOTE — Progress Notes (Signed)
The focus of this group is to help patients review their daily goal of treatment and discuss progress on daily workbooks. Pt attended the evening group session and responded to all discussion prompts from the Writer. Pt shared that today was a good day on the unit, the highlight of which was getting to talk with her foster mother on the phone. Pt's only additional request from Nursing Staff this evening was for a printed copy of the Lord's Prayer, which was provided to her following wrap-up group. Pt's affect was appropriate and she volunteered several encouraging comments to her peers.
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
## 2015-02-22 LAB — GLUCOSE, CAPILLARY: Glucose-Capillary: 106 mg/dL — ABNORMAL HIGH (ref 65–99)

## 2015-02-22 MED ORDER — HYDROCORTISONE 1 % EX CREA
TOPICAL_CREAM | Freq: Two times a day (BID) | CUTANEOUS | Status: DC
  Administered 2015-02-22 – 2015-02-23 (×2): via TOPICAL
  Filled 2015-02-22 (×2): qty 28

## 2015-02-22 NOTE — Plan of Care (Signed)
Problem: Alteration in mood Goal: LTG-Patient reports reduction in suicidal thoughts (Patient reports reduction in suicidal thoughts and is able to verbalize a safety plan for whenever patient is feeling suicidal)  Outcome: Progressing Holly [name] continues to report having suicidal thoughts but is able to contract for safety.

## 2015-02-22 NOTE — Progress Notes (Signed)
[facility] MD Progress Note  02/22/2015 4:18 PM Holly [name]  MRN:  [id number]  Objective:   Patient was admitted with bipolar mood disorder.  He crisis trigger was when her daycare worker noticed scratch/cut marks on her arms.  Her crisis trigger has been not being able to get in touch with her mother.    Subjective:  She is interacting well with fellow patient and nursing staff.  She reports improved sleep from the nightmares as minipress did help.  She is c/o anal itching and some burning  Principal Problem: Bipolar disorder, curr episode mixed, severe, with psychotic features (HCC) Diagnosis:   Patient Active Problem List   Diagnosis Date Noted  . Injury, self-inflicted [F48.9]   . Schizoaffective disorder, bipolar type (HCC) [F25.0]   . 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 Psychosis [F29] 03/18/2014  . Bipolar disorder, curr episode mixed, severe, with psychotic features (HCC) [F31.64] 12/14/2013  . ADHD (attention deficit hyperactivity disorder) [F90.9] 01/03/2013  . PTSD (post-traumatic stress disorder) [F43.10] 01/03/2013  . Mental retardation [F79] 01/03/2013  . Agitation [R45.1] 01/03/2013   Total Time spent with patient: 30 minutes  Past Psychiatric History: Bipolar mood disorder.  PTSD  Past Medical History:  Past Medical History  Diagnosis Date  . Bipolar 1 disorder (HCC)   . Gluten free diet   . ADHD (attention deficit hyperactivity disorder)   . PTSD (post-traumatic stress disorder)   . 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 Mood disorder (HCC)   . Diabetes mellitus   . Obesity   . Depression   . Hypothyroidism     Past Surgical History  Procedure Laterality Date  . None    . No past surgeries     Family History: History reviewed. No pertinent family history. Family Psychiatric  History: bipolar disorder, PTSD Social History:  History  Alcohol Use No     History  Drug Use No    Social History   Social History  . Marital Status: Single    Spouse  Name: N/A  . Number of Children: N/A  . Years of Education: N/A   Social History Main Topics  . Smoking status: Never Smoker   . Smokeless tobacco: Never Used  . Alcohol Use: No  . Drug Use: No  . Sexual Activity: No   Other Topics Concern  . None   Social History Narrative   Additional Social History:    History of alcohol / drug use?: No history of alcohol / drug abuse    Sleep: Fair  Appetite:  Fair  Current Medications: Current Facility-Administered Medications  Medication Dose Route Frequency Provider Last Rate Last Dose  . acetaminophen (TYLENOL) tablet 650 mg  650 mg Oral Q6H PRN Beau Fanny, FNP   650 mg at 02/22/15 1425  . acidophilus (RISAQUAD) capsule 1 capsule  1 capsule Oral TID WC Beau Fanny, FNP   1 capsule at 02/22/15 1206  . alum & mag hydroxide-simeth (MAALOX/MYLANTA) 200-200-20 MG/5ML suspension 30 mL  30 mL Oral Q4H PRN Beau Fanny, FNP   30 mL at 02/21/15 1823  . FLUoxetine (PROZAC) capsule 20 mg  20 mg Oral Daily Beau Fanny, FNP   20 mg at 02/22/15 4098  . fluticasone (FLONASE) 50 MCG/ACT nasal spray 1 spray  1 spray Each Nare Daily PRN Beau Fanny, FNP      . levothyroxine (SYNTHROID, LEVOTHROID) tablet 100 mcg  100 mcg Oral QAC breakfast John C  Withrow, FNP   100 mcg at 02/22/15 0618  . lithium carbonate (ESKALITH) CR tablet 450 mg  450 mg Oral q morning - 10a Beau Fanny, FNP   450 mg at 02/22/15 1041  . lithium carbonate (ESKALITH) CR tablet 900 mg  900 mg Oral QHS Beau Fanny, FNP   900 mg at 02/21/15 2151  . magnesium hydroxide (MILK OF MAGNESIA) suspension 30 mL  30 mL Oral Daily PRN Beau Fanny, FNP      . [START ON 02/26/2015] medroxyPROGESTERone (DEPO-PROVERA) injection 150 mg  150 mg Intramuscular Q90 days Beau Fanny, FNP      . metFORMIN (GLUCOPHAGE) tablet 850 mg  850 mg Oral BID WC Beau Fanny, FNP   850 mg at 02/22/15 1610  . OLANZapine (ZYPREXA) tablet 5 mg  5 mg Oral TID Beau Fanny, FNP   5 mg at  02/22/15 1206  . pantoprazole (PROTONIX) EC tablet 40 mg  40 mg Oral Daily Beau Fanny, FNP   40 mg at 02/22/15 9604  . prazosin (MINIPRESS) capsule 2 mg  2 mg Oral QHS Adonis Brook, NP   2 mg at 02/21/15 2151    Lab Results:  Results for orders placed or performed during the hospital encounter of 02/20/15 (from the past 48 hour(s))  Pregnancy, urine     Status: None   Collection Time: 02/20/15  5:21 PM  Result Value Ref Range   Preg Test, Ur NEGATIVE NEGATIVE    Comment:        THE SENSITIVITY OF THIS METHODOLOGY IS >20 mIU/mL. Performed at [facility]   Glucose, capillary     Status: Abnormal   Collection Time: 02/20/15  6:24 PM  Result Value Ref Range   Glucose-Capillary 122 (H) 65 - 99 mg/dL  Carbamazepine level, total     Status: None   Collection Time: 02/20/15  6:37 PM  Result Value Ref Range   Carbamazepine Lvl 8.0 4.0 - 12.0 ug/mL    Comment: CREDITED DUE TO LABELING ISSUE SPOKE TO [facility] Performed at [facility]   T4, free     Status: None   Collection Time: 02/20/15  6:37 PM  Result Value Ref Range   Free T4 0.70 0.61 - 1.12 ng/dL    Comment: Performed at [facility]  TSH     Status: None   Collection Time: 02/20/15  6:37 PM  Result Value Ref Range   TSH 3.444 0.350 - 4.500 uIU/mL    Comment: Performed at [facility]  Glucose, capillary     Status: Abnormal   Collection Time: 02/20/15  8:56 PM  Result Value Ref Range   Glucose-Capillary 146 (H) 65 - 99 mg/dL  Glucose, capillary     Status: Abnormal   Collection Time: 02/21/15  6:13 AM  Result Value Ref Range   Glucose-Capillary 102 (H) 65 - 99 mg/dL  Urinalysis, Routine w reflex microscopic (not at [facility])     Status: Abnormal   Collection Time: 02/21/15  6:30 PM  Result Value Ref Range   Color, Urine YELLOW YELLOW   APPearance CLEAR CLEAR   Specific Gravity, Urine 1.012 1.005 - 1.030   pH 6.0 5.0 - 8.0   Glucose, UA  NEGATIVE NEGATIVE mg/dL   Hgb urine dipstick SMALL (A) NEGATIVE   Bilirubin Urine NEGATIVE NEGATIVE   Ketones, ur NEGATIVE NEGATIVE mg/dL   Protein, ur NEGATIVE NEGATIVE mg/dL  Urobilinogen, UA 0.2 0.0 - 1.0 mg/dL   Nitrite NEGATIVE NEGATIVE   Leukocytes, UA NEGATIVE NEGATIVE    Comment: Performed at [facility]  Urine microscopic-add on     Status: None   Collection Time: 02/21/15  6:30 PM  Result Value Ref Range   RBC / HPF 0-2 <3 RBC/hpf    Comment: Performed at [facility]  Glucose, capillary     Status: None   Collection Time: 02/21/15  8:59 PM  Result Value Ref Range   Glucose-Capillary 91 65 - 99 mg/dL   Comment 1 Notify RN   Glucose, capillary     Status: Abnormal   Collection Time: 02/22/15  6:08 AM  Result Value Ref Range   Glucose-Capillary 106 (H) 65 - 99 mg/dL   Comment 1 Notify RN     Physical Findings: AIMS: Facial and Oral Movements Muscles of Facial Expression: None, normal Lips and Perioral Area: None, normal Jaw: None, normal Tongue: None, normal,Extremity Movements Upper (arms, wrists, hands, fingers): None, normal Lower (legs, knees, ankles, toes): None, normal, Trunk Movements Neck, shoulders, hips: None, normal, Overall Severity Severity of abnormal movements (highest score from questions above): None, normal Incapacitation due to abnormal movements: None, normal Patient's awareness of abnormal movements (rate only patient's report): No Awareness, Dental Status Current problems with teeth and/or dentures?: No Does patient usually wear dentures?: No  CIWA:  CIWA-Ar Total: 1 COWS:  COWS Total Score: 2  Musculoskeletal: Strength & Muscle Tone: within normal limits Gait & Station: normal Patient leans: N/A  Psychiatric Specialty Exam: Review of Systems  All other systems reviewed and are negative.   Blood pressure 121/72, pulse 116, temperature 98.2 F (36.8 C), temperature source Oral, resp. rate 18,  height 5\' 2"  (1.575 m), weight 102.513 kg (226 lb), SpO2 3 %.Body mass index is 41.33 kg/(m^2).   General Appearance: Neat  Eye Contact:: Good  Speech: Normal Rate  Volume: Normal  Mood: Euthymic  Affect: Appropriate and Congruent  Thought Process: Coherent  Orientation: Full (Time, Place, and Person)  Thought Content: Rumination  Suicidal Thoughts: Yes passive  Homicidal Thoughts: No  Memory: Immediate; Good Recent; Good Remote; Good  Judgement: Intact  Insight: Fair  Psychomotor Activity: Normal  Concentration: Fair  Recall: Good  Fund of Knowledge:Good  Language: Good  Akathisia: Negative  Handed: Right  AIMS (if indicated):    Assets: Communication Skills Physical Health Resilience Social Support  ADL's: Intact  Cognition: WNL  Sleep:  5 hrs       Treatment Plan Summary: Admit for crisis management and mood stabilization. Medication management to re-stabilize current mood symptoms Group counseling sessions for coping skills Medical consults as needed Review and reinstate any pertinent home medications for other health problems  Velna HatchetSheila May Agustin AGNP-BC 02/22/2015, 4:18 PM I agree with assessment and plan Madie RenoIrving A. Dub MikesLugo, M.D.
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
## 2015-02-22 NOTE — [facility] Group Notes (Signed)
[facility] Group Notes:  (Clinical Social Work)  02/22/2015  [facility] Group Notes:  (Clinical Social Work)  02/22/2015  11:00AM-12:00PM  Summary of Progress/Problems:  The main focus of today's process group was to listen to a variety of genres of music and to identify that different types of music provoke different responses.  The patient then was able to identify personally what was soothing for them, as well as energizing.   The patient expressed understanding of concepts, as well as knowledge of how each type of music affected him/her and how this can be used at home as a wellness/recovery tool.  She left the room and returned 3 times, finally left and stayed gone a long time.  Said she was needing to go to the bathroom a lot.  Type of Therapy:  Music Therapy   Participation Level:  Active  Participation Quality:  Attentive   Affect:  Excited  Cognitive:  Oriented  Insight:  Limited  Engagement in Therapy:  Limited  Modes of Intervention:   Activity, Exploration  Ambrose MantleMareida Grossman-Orr, LCSW 02/22/2015

## 2015-02-22 NOTE — Progress Notes (Signed)
D:  Passive SI at this time - conntracts for safetyPt denies HI/AVH. Pt is pleasant and cooperative. Pt stated she feels "medium", pt said up and down at times.  A: Pt was offered support and encouragement. Pt was given scheduled medications. Pt was encourage to attend groups. Q 15 minute checks were done for safety.   R:Pt attends groups and interacts well with peers and staff. Pt is taking medication. Pt has no complaints at this time .Pt receptive to treatment and safety maintained on unit.

## 2015-02-22 NOTE — Plan of Care (Addendum)
Problem: Alteration in mood Goal: LTG-Patient reports reduction in suicidal thoughts (Patient reports reduction in suicidal thoughts and is able to verbalize a safety plan for whenever patient is feeling suicidal)  Outcome: not  Progressing Pt passive SI at this time , contracts for safety

## 2015-02-22 NOTE — Progress Notes (Signed)
Adult Psychoeducational Group Note  Date:  02/22/2015 Time:  8:29 PM  Group Topic/Focus:  Wrap-Up Group:   The focus of this group is to help patients review their daily goal of treatment and discuss progress on daily workbooks.  Participation Level:  Active  Participation Quality:  Appropriate  Affect:  Appropriate  Cognitive:  Appropriate  Insight: Appropriate  Engagement in Group:  Engaged  Modes of Intervention:  Discussion  Additional Comments: The patient expressed that she attended group.The patient also said that group was about question that you have for the Doctor.  Octavio Mannshigpen, Olubunmi Rothenberger Lee 02/22/2015, 8:29 PM
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
## 2015-02-22 NOTE — Progress Notes (Signed)
Holly [name] has been visible on the unit.  She has been attending groups.  Interacting with peers.  She completed her self inventory and reports that her depression, hopelessness and anxiety are all 5/10.  Her goal for today is to not hurt herself.  She admits to having suicidal thoughts and is able to contract for safety on the unit.  She denies any A/V hallucinations.  She requested to speak with the chaplain and he visited reporting spiritual needs that she will need to work through.  He is going to try to work on getting her Bible (old and new testament) on the unit.  She c/o of headache 3/10 and tylenol given with good relief.  Current pain level 0/10.  Encouraged her to continue participating in group and unit activities.  Q 15 minute checks maintained for safety.  We will continue to monitor the progress towards her goals.

## 2015-02-22 NOTE — [facility] Group Notes (Signed)
[facility] Group Notes:  (Nursing/MHT/Case Management/Adjunct)  Date:  02/22/2015  Time:  11:25 AM  Type of Therapy:  Nurse Education  Participation Level:  Active  Participation Quality:  Attentive, Sharing and Supportive  Affect:  Appropriate and Excited  Cognitive:  Alert and Appropriate  Insight:  Limited  Engagement in Group:  Engaged and Supportive  Modes of Intervention:  Discussion and Education  Summary of Progress/Problems:   Group topic was [employer].  Karisa reports that her support system includes her guardian, mother. She was very supportive with other members of the group and even clapped when a peer shared about a previous visit in the hospital.    Levin BaconHeather V Shabrea Weldin 02/22/2015, 11:25 AM

## 2015-02-23 DIAGNOSIS — R45851 Suicidal ideations: Secondary | ICD-10-CM

## 2015-02-23 DIAGNOSIS — F3164 Bipolar disorder, current episode mixed, severe, with psychotic features: Secondary | ICD-10-CM

## 2015-02-23 MED ORDER — MEDROXYPROGESTERONE ACETATE 150 MG/ML IM SUSP
150.0000 mg | INTRAMUSCULAR | Status: DC
  Administered 2015-02-23: 150 mg via INTRAMUSCULAR
  Filled 2015-02-23: qty 1

## 2015-02-23 MED ORDER — OLANZAPINE 5 MG PO TABS
5.0000 mg | ORAL_TABLET | Freq: Every day | ORAL | Status: DC
  Administered 2015-02-24: 5 mg via ORAL
  Filled 2015-02-23 (×2): qty 1

## 2015-02-23 MED ORDER — BENZTROPINE MESYLATE 0.5 MG PO TABS
0.5000 mg | ORAL_TABLET | ORAL | Status: DC
  Administered 2015-02-23 – 2015-02-24 (×2): 0.5 mg via ORAL
  Filled 2015-02-23 (×7): qty 1

## 2015-02-23 MED ORDER — OLANZAPINE 5 MG PO TABS
5.0000 mg | ORAL_TABLET | Freq: Every day | ORAL | Status: DC
  Administered 2015-02-24: 5 mg via ORAL
  Filled 2015-02-23 (×3): qty 1

## 2015-02-23 MED ORDER — OLANZAPINE 10 MG PO TABS
10.0000 mg | ORAL_TABLET | Freq: Every day | ORAL | Status: DC
  Administered 2015-02-23: 10 mg via ORAL
  Filled 2015-02-23 (×3): qty 1

## 2015-02-23 NOTE — Clinical Social Work Note (Addendum)
CSW spoke w [employer] [place] [employer] CSW, Karoline CaldwellMichelle Duchatz, [place] [facility] [place]  [place] clinical team lead is Clementeen Hoofriana [place] [phone].  Santa GeneraAnne Cunningham, LCSW Clinical Social Worker
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
## 2015-02-23 NOTE — Progress Notes (Signed)
DAR Note: Holly [name] has been visible on the unit.  Attending groups.  She denies any suicidal thoughts today but states that if she starts having thoughts she will seek out staff for assistance.  She denies any A/V hallucinations.  She denies any physical complaints and appears to be in no physical distress.  She didn't go outside with the group stating her allergies are too bad to go outside.  She reports that her depression is 2/10, hopelessness is 3/10 and her anxiety is 1/10.  Her goal for today is "trying my best to do good and stay positive."  She has been in the day room much of the day.  Interacting with peers.  EKG Completed as ordered.  Depo Provera administered per MD order.  She tolerated well.  Q 15 minute checks maintained for safety.  We will continue to monitor the progress towards her goals.

## 2015-02-23 NOTE — [facility] Group Notes (Signed)
[facility] LCSW Group Therapy 02/23/2015  1:15 pm  Type of Therapy: Group Therapy Participation Level: Active  Participation Quality: Attentive, Sharing and Supportive  Affect: Appropriate  Cognitive: Alert and Oriented  Insight: Developing/Improving and Engaged  Engagement in Therapy: Developing/Improving and Engaged  Modes of Intervention: Clarification, Confrontation, Discussion, Education, Exploration,  Limit-setting, Orientation, Problem-solving, Rapport Building, Dance movement psychotherapisteality Testing, Socialization and Support  Summary of Progress/Problems: Pt identified obstacles faced currently and processed barriers involved in overcoming these obstacles. Pt identified steps necessary for overcoming these obstacles and explored motivation (internal and external) for facing these difficulties head on. Pt further identified one area of concern in their lives and chose a goal to focus on for today. Patient discussed wanting to be closer to her family and gain her independence. CSW and other group members provided patient with emotional support and encouragement.   Holly BruinKristin Courtnay [name], MSW, [employer]CSWA Clinical Social Worker [facility] [phone]
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
## 2015-02-23 NOTE — Plan of Care (Signed)
Problem: Alteration in mood Goal: LTG-Patient reports reduction in suicidal thoughts (Patient reports reduction in suicidal thoughts and is able to verbalize a safety plan for whenever patient is feeling suicidal)  Outcome: Progressing Holly [name] reports that she is feeling better and her suicidal thoughts are getting better.  She didn't have suicidal thoughts this morning but will notify staff if she is starting to have those feelings again.

## 2015-02-24 MED ORDER — INFLUENZA VAC SPLIT QUAD 0.5 ML IM SUSY
0.5000 mL | PREFILLED_SYRINGE | INTRAMUSCULAR | Status: AC
  Administered 2015-02-24: 0.5 mL via INTRAMUSCULAR
  Filled 2015-02-24: qty 0.5

## 2015-02-24 MED ORDER — OLANZAPINE 5 MG PO TABS: 5.0000 mg | ORAL_TABLET | Freq: Every day | ORAL | Status: DC

## 2015-02-24 MED ORDER — PANTOPRAZOLE SODIUM 40 MG PO TBEC: 40.0000 mg | DELAYED_RELEASE_TABLET | Freq: Every day | ORAL | Status: DC

## 2015-02-24 MED ORDER — PRAZOSIN HCL 2 MG PO CAPS: 2.0000 mg | ORAL_CAPSULE | Freq: Every day | ORAL | Status: DC

## 2015-02-24 MED ORDER — LEVOTHYROXINE SODIUM 100 MCG PO TABS: 100.0000 ug | ORAL_TABLET | Freq: Every day | ORAL | Status: DC

## 2015-02-24 MED ORDER — MEDROXYPROGESTERONE ACETATE 150 MG/ML IM SUSP: 150.0000 mg | INTRAMUSCULAR | Status: DC

## 2015-02-24 MED ORDER — METFORMIN HCL 850 MG PO TABS: 850.0000 mg | ORAL_TABLET | Freq: Two times a day (BID) | ORAL | Status: DC

## 2015-02-24 MED ORDER — LITHIUM CARBONATE ER 450 MG PO TBCR: 450.0000 mg | EXTENDED_RELEASE_TABLET | Freq: Every morning | ORAL | Status: DC

## 2015-02-24 MED ORDER — FLUTICASONE PROPIONATE 50 MCG/ACT NA SUSP: 1.0000 | Freq: Every day | NASAL | Status: DC | PRN

## 2015-02-24 MED ORDER — FLUOXETINE HCL 20 MG PO CAPS: 20.0000 mg | ORAL_CAPSULE | Freq: Every day | ORAL | Status: DC

## 2015-02-24 MED ORDER — LITHIUM CARBONATE ER 450 MG PO TBCR: 900.0000 mg | EXTENDED_RELEASE_TABLET | Freq: Every day | ORAL | Status: DC

## 2015-02-24 MED FILL — Medication: Qty: 1 | Status: AC

## 2015-02-24 NOTE — Progress Notes (Signed)
Patient ID: Holly [name], female   DOB: June 20, 1995, 19 y.o.   MRN: [id number] DIS - CHARGE  NOTE  ---    DC pt. Into care of group home rep.  All prescriptions were provided and explained.  Pt. Agrees to attend all out-pt. Appointments and to be compliant on all medications as prescribed.  All possessions were returned.  Pt. Was happy and excited to be going home and agreed to contact for safety, denied SI/HI/HA and no pain.  --- A  ---  Escort pt. To front lobby at 1430 hrs., 02/24/15   --- R --  Pt. Was safe at time of DC

## 2015-02-24 NOTE — Progress Notes (Signed)
Patient ID: Charmayne SheerJerrielynn N Meader, female   DOB: Sep 26, 1995, 19 y.o.   MRN: [id number]  Adult Psychoeducational Group Note  Date:  02/24/2015 Time: 09:35am  Group Topic/Focus:  Recovery Goals:   The focus of this group is to identify appropriate goals for recovery and establish a plan to achieve them.  Participation Level:  Active  Participation Quality:  Attentive, Monopolizing and Redirectable  Affect:  Anxious  Cognitive:  Alert and Oriented  Insight: Improving  Engagement in Group:  Engaged and Monopolizing  Modes of Intervention:  Activity, Education and Support  Additional Comments:  Pt engaged in group, pt talks over other pts and veers off topic. Pt is redirectable. Pt able to identify one daily goal to accomplish today.   Aurora Maskwyman, Marcy Sookdeo E 02/24/2015, 10:55 AM

## 2015-02-24 NOTE — Progress Notes (Signed)
D: Pt denies SI/HI/AVH. Pt is pleasant and cooperative. Pt excited about leaving. Pt stated she was feeling better.   A: Pt was offered support and encouragement. Pt was given scheduled medications. Pt was encourage to attend groups. Q 15 minute checks were done for safety.   R:Pt attends groups and interacts well with peers and staff. Pt is taking medication. Pt has no complaints at this time .Pt receptive to treatment and safety maintained on unit.

## 2015-02-24 NOTE — Progress Notes (Signed)
  [facility] Dis[facility] :  Will you be returning to the same living situation after discharge:  No, patient plans to discharge to [facility] At discharge, do you have transportation home?: Yes,  transportation arranged by guardian Do you have the ability to pay for your medications: Yes,  patient will be provided with prescriptions at discharge  Release of information consent forms completed and in the chart;  Patient's signature needed at discharge.  Patient to Follow up at: Follow-up Information    Go to [facility].   Why:  Patient current w this provider.     Contact information:   [address] [place]  [address] Phone:  [phone] Fax:  [phone]       [facility] .   Contact information:   Earlie RavelingMichele Juchatz, Guardianship Social Worker  [address] [place], [place] [address]  [phone] (office) [phone] (fax)       Follow up with [facility].   Why:  Patient sees this therapist regularly, guardian will arrange follow up appointments as appropriate   Contact information:   [address] [place]  [id number] Phone:  [phone]      Follow up with [facility] On 02/24/2015.   Why:  [facility] per DSS guardian.       Patient denies SI/HI: Yes,  denies    Aeronautical engineerafety Planning and Suicide Prevention discussed: Yes,  with patient   Have you used any form of tobacco in the last 30 days? (Cigarettes, Smokeless Tobacco, Cigars, and/or Pipes): No  Has patient been referred to the Quitline?: N/A patient is not a smoker  Holly [name] L 02/24/2015, 11:01 AM
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
## 2015-02-24 NOTE — Discharge Summary (Signed)
Physician Discharge Summary Note  Patient:  Holly [name] is an 19 y.o., female MRN:  [id number] DOB:  21-Oct-1995 Patient phone:  [phone] (home)  Patient address:   [address] [place] [address],  Total Time spent with patient: 30 minutes  Date of Admission:  02/20/2015 Date of Discharge: 02/24/2015  Reason for Admission:  depression  Principal Problem: Bipolar disorder, curr episode mixed, severe, with psychotic features [facility]) Discharge Diagnoses: Patient Active Problem List   Diagnosis Date Noted  . Injury, self-inflicted [F48.9]   . Schizoaffective disorder, bipolar type (HCC) [F25.0]   . 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 Psychosis [F29] 03/18/2014  . Bipolar disorder, curr episode mixed, severe, with psychotic features (HCC) [F31.64] 12/14/2013  . ADHD (attention deficit hyperactivity disorder) [F90.9] 01/03/2013  . PTSD (post-traumatic stress disorder) [F43.10] 01/03/2013  . Mental retardation [F79] 01/03/2013  . Agitation [R45.1] 01/03/2013    Musculoskeletal: Strength & Muscle Tone: within normal limits Gait & Station: normal Patient leans: N/A  Psychiatric Specialty Exam:  SEE MD SRA Physical Exam  Vitals reviewed. Psychiatric: Her mood appears not anxious. She is not agitated and not aggressive. Thought content is not paranoid and not delusional. She exhibits a depressed mood. She expresses no homicidal and no suicidal ideation. She expresses no suicidal plans and no homicidal plans.    Review of Systems  All other systems reviewed and are negative.   Blood pressure 117/77, pulse 112, temperature 97.8 F (36.6 C), temperature source Oral, resp. rate 17, height 5\' 2"  (1.575 m), weight 102.513 kg (226 lb), SpO2 3 %.Body mass index is 41.33 kg/(m^2).  Have you used any form of tobacco in the last 30 days? (Cigarettes, Smokeless Tobacco, Cigars, and/or Pipes): No  Has this patient used any form of tobacco in the last 30 days? (Cigarettes,  Smokeless Tobacco, Cigars, and/or Pipes) N/A  Past Medical History:  Past Medical History  Diagnosis Date  . Bipolar 1 disorder (HCC)   . Gluten free diet   . ADHD (attention deficit hyperactivity disorder)   . PTSD (post-traumatic stress disorder)   . 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 Mood disorder (HCC)   . Diabetes mellitus   . Obesity   . Depression   . Hypothyroidism     Past Surgical History  Procedure Laterality Date  . None    . No past surgeries     Family History: History reviewed. No pertinent family history. Social History:  History  Alcohol Use No     History  Drug Use No    Social History   Social History  . Marital Status: Single    Spouse Name: N/A  . Number of Children: N/A  . Years of Education: N/A   Social History Main Topics  . Smoking status: Never Smoker   . Smokeless tobacco: Never Used  . Alcohol Use: No  . Drug Use: No  . Sexual Activity: No   Other Topics Concern  . 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  She was treated discharged with the medications listed below under Medication List.  Medical problems were identified and treated as needed.  Home medications were restarted as appropriate.  Improvement was monitored by observation and Holly [name] daily report of symptom reduction.  Emotional and mental status was  monitored by daily self-inventory reports completed by Holly [name] and clinical staff.         Holly [name] was evaluated by the treatment team for stability and plans for continued recovery upon discharge.  Holly [name] motivation was an integral factor for scheduling further treatment.  Employment, transportation, bed availability, health status, family support, and any pending legal issues were also considered during her hospital stay.  She was offered further treatment options upon discharge including but not limited to Residential, Intensive Outpatient, and Outpatient treatment.  Holly [name] will follow up with the services as listed below under Follow Up Information.     Upon completion of this admission the patient was both mentally and medically stable for discharge denying suicidal/homicidal ideation, auditory/visual/tactile hallucinations, delusional thoughts and paranoia.      Consults:  psychiatry  Significant Diagnostic Studies:  labs: per ED  Discharge Vitals:   Blood pressure 117/77, pulse 112, temperature 97.8 F (36.6 C), temperature source Oral, resp. rate 17, height 5\' 2"  (1.575 m), weight 102.513 kg (226 lb), SpO2 3 %. Body mass index is 41.33 kg/(m^2). Lab Results:   Results for orders placed or performed during the hospital encounter of 02/20/15 (from the past 72 hour(s))  Urinalysis, Routine w reflex microscopic (not at [facility])     Status: Abnormal   Collection Time: 02/21/15  6:30 PM  Result Value Ref Range   Color, Urine YELLOW YELLOW   APPearance CLEAR CLEAR   Specific Gravity, Urine 1.012 1.005 - 1.030   pH 6.0 5.0 - 8.0   Glucose, UA NEGATIVE NEGATIVE mg/dL   Hgb urine dipstick SMALL (A) NEGATIVE   Bilirubin Urine NEGATIVE NEGATIVE   Ketones, ur NEGATIVE NEGATIVE mg/dL   Protein, ur NEGATIVE NEGATIVE mg/dL   Urobilinogen, UA 0.2 0.0 - 1.0 mg/dL   Nitrite NEGATIVE NEGATIVE   Leukocytes, UA NEGATIVE NEGATIVE    Comment: Performed at [facility]  Urine microscopic-add on     Status: None   Collection Time: 02/21/15  6:30 PM  Result Value Ref Range   RBC / HPF 0-2 <3 RBC/hpf    Comment: Performed at [facility]  Glucose, capillary     Status: None   Collection Time: 02/21/15  8:59 PM  Result Value Ref Range   Glucose-Capillary 91 65 - 99 mg/dL   Comment 1 Notify RN   Glucose, capillary     Status: Abnormal   Collection Time: 02/22/15  6:08 AM  Result Value Ref Range   Glucose-Capillary 106 (H) 65 - 99 mg/dL   Comment 1 Notify RN     Physical Findings: AIMS: Facial and Oral Movements Muscles of Facial Expression: None, normal Lips and Perioral Area: None, normal Jaw: None, normal Tongue: None, normal,Extremity Movements Upper (arms, wrists, hands, fingers): None, normal Lower (legs, knees, ankles, toes): None, normal, Trunk Movements Neck, shoulders, hips: None, normal, Overall Severity Severity of abnormal movements (highest score from questions above): None, normal Incapacitation due to abnormal movements: None,  normal Patient's awareness of abnormal movements (rate only patient's report): No Awareness, Dental Status Current problems with teeth and/or dentures?: No Does patient usually wear dentures?: No  CIWA:  CIWA-Ar Total: 1 COWS:  COWS Total Score: 2   See Psychiatric Specialty Exam and Suicide Risk Assessment completed by Attending Physician prior to discharge.  Discharge destination:  Home  Is patient on multiple antipsychotic therapies at discharge:  No   Has Patient had three or more failed trials of antipsychotic monotherapy by history:  No    Recommended Plan for Multiple Antipsychotic Therapies: NA     Medication List    STOP taking these medications        acetaminophen 500 MG tablet  Commonly known as:  TYLENOL     carbamazepine 200 MG 12 hr tablet  Commonly known as:  TEGRETOL XR     chlorproMAZINE 100 MG tablet  Commonly known as:   THORAZINE     LORazepam 1 MG tablet  Commonly known as:  ATIVAN     methylphenidate 27 MG CR tablet  Commonly known as:  CONCERTA     omeprazole 20 MG capsule  Commonly known as:  PRILOSEC  Replaced by:  pantoprazole 40 MG tablet      TAKE these medications      Indication   acidophilus Caps capsule  Take 1 capsule by mouth 3 (three) times daily as needed.   Indication:  Inability of Body to Handle Lactose or Milk Sugar     FLUoxetine 20 MG capsule  Commonly known as:  PROZAC  Take 1 capsule (20 mg total) by mouth daily.   Indication:  Major Depressive Disorder     fluticasone 50 MCG/ACT nasal spray  Commonly known as:  FLONASE  Place 1 spray into both nostrils daily as needed for allergies.   Indication:  Hayfever     levothyroxine 100 MCG tablet  Commonly known as:  SYNTHROID, LEVOTHROID  Take 1 tablet (100 mcg total) by mouth daily before breakfast.   Indication:  Underactive Thyroid     lithium carbonate 450 MG CR tablet  Commonly known as:  ESKALITH  Take 1 tablet (450 mg total) by mouth every morning.   Indication:  Manic-Depression, Schizoaffective Disorder, Mood Stabilization     lithium carbonate 450 MG CR tablet  Commonly known as:  ESKALITH  Take 2 tablets (900 mg total) by mouth at bedtime.   Indication:  Manic-Depression, Schizoaffective Disorder, Mood Stabilization     medroxyPROGESTERone 150 MG/ML injection  Commonly known as:  DEPO-PROVERA  Inject 1 mL (150 mg total) into the muscle every 3 (three) months. Last dose 02/23/2015   Indication:  Pregnancy     metFORMIN 850 MG tablet  Commonly known as:  GLUCOPHAGE  Take 1 tablet (850 mg total) by mouth 2 (two) times daily with a meal.   Indication:  Type 2 Diabetes     OLANZapine 5 MG tablet  Commonly known as:  ZYPREXA  Take 1 tablet (5 mg total) by mouth daily with breakfast. Then take 1 tablet (5 mg ) at lunch.  Then take 2 tablets (10 mg) at dinner.      pantoprazole 40 MG tablet  Commonly  known as:  PROTONIX  Take 1 tablet (40 mg total) by mouth daily.   Indication:  Gastroesophageal Reflux Disease     prazosin 2 MG capsule  Commonly known as:  MINIPRESS  Take 1 capsule (2 mg total) by mouth at bedtime.  Indication:  nightmares           Follow-up Information    Go to [facility].   Why:  Patient current w this provider.     Contact information:   [address] [place]  [address] Phone:  [phone] Fax:  [phone]       Follow up with [facility] DSS .   Contact information:   Earlie Raveling, Guardianship Social Worker  [address] [place], [place] [address]  [phone] (office) [phone] (fax)       Follow up with [facility].   Why:  Patient sees this therapist regularly, guardian will arrange follow up appointments as appropriate   Contact information:   [address] [place]  [id number] Phone:  [phone]      Follow up with [facility] On 02/24/2015.   Why:  Will transfer to [place] [facility] crisis facility per DSS guardian.       Follow-up recommendations:  Activity:  as tol Diet:  as tol  Comments:  1.  Take all your medications as prescribed.              2.  Report any adverse side effects to outpatient provider.                       3.  Patient instructed to not use alcohol or illegal drugs while on prescription medicines.            4.  In the event of worsening symptoms, instructed patient to call 911, the crisis hotline or go to nearest emergency room for evaluation of symptoms.  Total Discharge Time:  30 min  Signed: Velna Hatchet May Stella Encarnacion AGNP-BC 02/24/2015, 12:25 PM

## 2015-02-24 NOTE — Clinical Social Work Note (Signed)
CSW has left voicemails for [facility] staff member and DSS guardian Marcelino DusterMichelle regarding patient's discharge plans. Awaiting return calls.  Samuella BruinKristin Lawsyn Heiler, MSW, [employer] Clinical Social Worker [facility] [phone]

## 2015-02-24 NOTE — Tx Team (Signed)
  Interdisciplinary Treatment Plan Update (Adult)  Date: 02/24/2015   Time Reviewed: 10:57 AM    Progress in Treatment: Attending groups: Yes. Participating in groups:  Yes. Taking medication as prescribed:  Yes.MD assessing for appropriate medication regimen Tolerating medication:  Yes. Family/Significant othe contact made:  Yes, CSW has spoken with DSS guardian Patient understands diagnosis:  No.CSW assessing Discussing patient identified problems/goals with staff:  Yes. Medical problems stabilized or resolved:  Yes. Denies suicidal/homicidal ideation: Yes denies Issues/concerns per patient self-inventory:  Other:  New problem(s) identified:  Contact guardian, identify current providers and living situation.    Discharge Plan or Barriers: Discharge to [facility] crisis facility  Reason for Continuation of Hospitalization: Depression Medication stabilization Suicidal ideation  Comments:  Patient has DSS guardian, CSW attempting to contact.  Resident of [facility] in [facility], pt expresses frustration at not being able to live on her own or have contact w family. Says she has been in foster care since age 41 with multiple placements and hospitalizations.  Says she has been told that if she came to hospital "one more time" she would lose her current housing and would be homeless, veracity of these ideas needs to be determined.  Pt attends day program per record, unsure where.  CSW will assess and attempt to reconnect w resources.    Estimated length of stay:  Discharge anticipated for today 10/18.  New goal(s): SI and medication stabilization  Additional Comments:  Patient and CSW reviewed pt's identified goals and treatment plan. Patient verbalized understanding and agreed to treatment plan. CSW will review [facility] "Discharge Process and Patient Involvement" Form w guardian.   Review of initial/current patient goals per problem list:    1.  Goal(s): Patient will participate in  aftercare plan  Met:  Yes  Target date: at discharge  As evidenced by: Patient will participate within aftercare plan AEB aftercare provider and housing plan at discharge being identified.  02/20/15:  CSW will contact guardian and assess for appropriate aftercare resources, current resident in ALF.    10/18: Goal met: Patient will discharge to [facility] crisis facility as arranged by DSS guardian  2.  Goal (s): Patient will exhibit decreased depressive symptoms and suicidal ideations.  Met:  Adequate for discharge per MD   Target date: at discharge  As evidenced by: Patient will utilize self rating of depression at 3 or below and demonstrate decreased signs of depression or be deemed stable for discharge by MD.  10/14:  Patient admitted w SI w plan and depression, cutting, goal not met.    10/18: Adequate for discharge per MD: Patient reports feeling safe for discharge to [facility] crisis facility.      Attendees: Patient:  02/24/2015 9:52 AM   Family:  02/24/2015 9:52 AM   Physician: Ursula Alert, MD 02/24/2015 9:52 AM   Nursing: Renee Rival RN 02/24/2015 9:52 AM   CSW: Erasmo Downer Demarri Elie, [place]  02/24/2015 9:52 AM   Other:  02/24/2015 9:52 AM   Other:  02/24/2015 9:52 AM   Other: Lars Pinks, Nurse CM 02/24/2015 9:52 AM   Other: Lucinda Dell, Beverly Sessions TCT 02/24/2015 9:52 AM   Other:  02/24/2015 9:52 AM   Other:  02/24/2015 9:52 AM   Other:  02/24/2015 9:52 AM   Other:           Scribe for Treatment Team:   Tilden Fossa, MSW, [facility] Clinical Social Worker [facility] [phone]

## 2015-02-24 NOTE — [facility] Suicide Risk Assessment (Signed)
[facility] INPATIENT:  Family/Significant Other Suicide Prevention Education  Suicide Prevention Education:  Patient Discharged to Other Healthcare Facility:  Suicide Prevention Education Not Provided: The patient is discharging to another healthcare facility for continuation of treatment ([facility]).  The patient's medical information, including suicide ideations and risk factors, are a part of the medical information shared with the receiving healthcare facility. SPE reviewed with patient and brochure provided. Patient encouraged to return to hospital if having suicidal thoughts, patient verbalized his/her understanding and has no further questions at this time.   Gaylen Pereira, [place] CarboKristin L 02/24/2015, 10:57 AM

## 2015-02-24 NOTE — Plan of Care (Signed)
Problem: Alteration in mood Goal: LTG-Patient reports reduction in suicidal thoughts (Patient reports reduction in suicidal thoughts and is able to verbalize a safety plan for whenever patient is feeling suicidal)  Outcome: Progressing Denies SI at this time     

## 2015-07-30 ENCOUNTER — Other Ambulatory Visit: Payer: Self-pay | Admitting: Family Medicine

## 2015-07-30 ENCOUNTER — Other Ambulatory Visit (HOSPITAL_COMMUNITY)
Admission: RE | Admit: 2015-07-30 | Discharge: 2015-07-30 | Disposition: A | Discharge status: Home / Self Care | Payer: Medicaid Other | Source: Ambulatory Visit | Attending: Family Medicine | Admitting: Family Medicine

## 2015-07-30 DIAGNOSIS — Z113 Encounter for screening for infections with a predominantly sexual mode of transmission: Secondary | ICD-10-CM | POA: Diagnosis present

## 2015-07-30 DIAGNOSIS — N760 Acute vaginitis: Secondary | ICD-10-CM | POA: Diagnosis present

## 2015-07-30 DIAGNOSIS — Z01419 Encounter for gynecological examination (general) (routine) without abnormal findings: Secondary | ICD-10-CM | POA: Insufficient documentation

## 2015-07-30 DIAGNOSIS — Z1151 Encounter for screening for human papillomavirus (HPV): Secondary | ICD-10-CM | POA: Insufficient documentation

## 2015-08-04 LAB — URINE CYTOLOGY ANCILLARY ONLY
Bacterial vaginitis: NEGATIVE
Candida vaginitis: NEGATIVE
Chlamydia: NEGATIVE
Neisseria Gonorrhea: NEGATIVE
Trichomonas: NEGATIVE

## 2015-08-05 LAB — CYTOLOGY - PAP

## 2015-08-18 ENCOUNTER — Encounter (HOSPITAL_COMMUNITY): Payer: Self-pay | Admitting: Emergency Medicine

## 2015-08-18 ENCOUNTER — Emergency Department (HOSPITAL_COMMUNITY)
Admission: EM | Admit: 2015-08-18 | Discharge: 2015-08-19 | Disposition: A | Discharge status: Other Acute Inpatient Hospital | Payer: Medicaid Other | Attending: Emergency Medicine | Admitting: Emergency Medicine

## 2015-08-18 ENCOUNTER — Emergency Department (HOSPITAL_COMMUNITY): Payer: Medicaid Other

## 2015-08-18 DIAGNOSIS — F250 Schizoaffective disorder, bipolar type: Secondary | ICD-10-CM | POA: Diagnosis present

## 2015-08-18 DIAGNOSIS — E119 Type 2 diabetes mellitus without complications: Secondary | ICD-10-CM | POA: Insufficient documentation

## 2015-08-18 DIAGNOSIS — R443 Hallucinations, unspecified: Secondary | ICD-10-CM | POA: Insufficient documentation

## 2015-08-18 DIAGNOSIS — R45851 Suicidal ideations: Secondary | ICD-10-CM

## 2015-08-18 DIAGNOSIS — F909 Attention-deficit hyperactivity disorder, unspecified type: Secondary | ICD-10-CM | POA: Insufficient documentation

## 2015-08-18 DIAGNOSIS — E669 Obesity, unspecified: Secondary | ICD-10-CM | POA: Insufficient documentation

## 2015-08-18 DIAGNOSIS — F3130 Bipolar disorder, current episode depressed, mild or moderate severity, unspecified: Secondary | ICD-10-CM | POA: Insufficient documentation

## 2015-08-18 DIAGNOSIS — Z3202 Encounter for pregnancy test, result negative: Secondary | ICD-10-CM | POA: Insufficient documentation

## 2015-08-18 DIAGNOSIS — E039 Hypothyroidism, unspecified: Secondary | ICD-10-CM | POA: Insufficient documentation

## 2015-08-18 DIAGNOSIS — Z79899 Other long term (current) drug therapy: Secondary | ICD-10-CM | POA: Insufficient documentation

## 2015-08-18 DIAGNOSIS — Z7984 Long term (current) use of oral hypoglycemic drugs: Secondary | ICD-10-CM | POA: Insufficient documentation

## 2015-08-18 LAB — CBC WITH DIFFERENTIAL/PLATELET
Basophils Absolute: 0 10*3/uL (ref 0.0–0.1)
Basophils Relative: 0 %
Eosinophils Absolute: 0.2 10*3/uL (ref 0.0–0.7)
Eosinophils Relative: 2 %
HCT: 36.4 % (ref 36.0–46.0)
Hemoglobin: 11.9 g/dL — ABNORMAL LOW (ref 12.0–15.0)
Lymphocytes Relative: 28 %
Lymphs Abs: 2.6 10*3/uL (ref 0.7–4.0)
MCH: 30.6 pg (ref 26.0–34.0)
MCHC: 32.7 g/dL (ref 30.0–36.0)
MCV: 93.6 fL (ref 78.0–100.0)
Monocytes Absolute: 0.6 10*3/uL (ref 0.1–1.0)
Monocytes Relative: 7 %
Neutro Abs: 6 10*3/uL (ref 1.7–7.7)
Neutrophils Relative %: 63 %
Platelets: 412 10*3/uL — ABNORMAL HIGH (ref 150–400)
RBC: 3.89 MIL/uL (ref 3.87–5.11)
RDW: 13.8 % (ref 11.5–15.5)
WBC: 9.4 10*3/uL (ref 4.0–10.5)

## 2015-08-18 LAB — SALICYLATE LEVEL: Salicylate Lvl: <4 mg/dL (ref 2.8–30.0)

## 2015-08-18 LAB — URINALYSIS, ROUTINE W REFLEX MICROSCOPIC
Bilirubin Urine: NEGATIVE
Glucose, UA: NEGATIVE mg/dL
Ketones, ur: NEGATIVE mg/dL
Nitrite: NEGATIVE
Protein, ur: NEGATIVE mg/dL
Specific Gravity, Urine: 1.009 (ref 1.005–1.030)
pH: 6.5 (ref 5.0–8.0)

## 2015-08-18 LAB — RAPID URINE DRUG SCREEN, HOSP PERFORMED
Amphetamines: NOT DETECTED
Barbiturates: NOT DETECTED
Benzodiazepines: NOT DETECTED
Cocaine: NOT DETECTED
Opiates: NOT DETECTED
Tetrahydrocannabinol: NOT DETECTED

## 2015-08-18 LAB — URINE MICROSCOPIC-ADD ON

## 2015-08-18 LAB — COMPREHENSIVE METABOLIC PANEL
ALT: 8 U/L — ABNORMAL LOW (ref 14–54)
AST: 17 U/L (ref 15–41)
Albumin: 4 g/dL (ref 3.5–5.0)
Alkaline Phosphatase: 110 U/L (ref 38–126)
Anion gap: 10 (ref 5–15)
BUN: 6 mg/dL (ref 6–20)
CO2: 19 mmol/L — ABNORMAL LOW (ref 22–32)
Calcium: 9.3 mg/dL (ref 8.9–10.3)
Chloride: 111 mmol/L (ref 101–111)
Creatinine, Ser: 0.63 mg/dL (ref 0.44–1.00)
GFR calc Af Amer: >60 mL/min (ref >60)
GFR calc non Af Amer: >60 mL/min (ref >60)
Glucose, Bld: 129 mg/dL — ABNORMAL HIGH (ref 65–99)
Potassium: 3.7 mmol/L (ref 3.5–5.1)
Sodium: 140 mmol/L (ref 135–145)
Total Bilirubin: 0.1 mg/dL — ABNORMAL LOW (ref 0.3–1.2)
Total Protein: 7.4 g/dL (ref 6.5–8.1)

## 2015-08-18 LAB — ETHANOL: Alcohol, Ethyl (B): <5 mg/dL (ref <5)

## 2015-08-18 LAB — ACETAMINOPHEN LEVEL: Acetaminophen (Tylenol), Serum: <10 ug/mL — ABNORMAL LOW (ref 10–30)

## 2015-08-18 MED ORDER — PANTOPRAZOLE SODIUM 40 MG PO TBEC: 40.0000 mg | DELAYED_RELEASE_TABLET | Freq: Every day | ORAL | Status: DC

## 2015-08-18 MED ORDER — LEVOTHYROXINE SODIUM 100 MCG PO TABS
100.0000 ug | ORAL_TABLET | Freq: Every day | ORAL | Status: DC
  Administered 2015-08-19: 100 ug via ORAL
  Filled 2015-08-18 (×2): qty 1

## 2015-08-18 MED ORDER — OLANZAPINE 5 MG PO TABS: 5.0000 mg | ORAL_TABLET | ORAL | Status: DC

## 2015-08-18 MED ORDER — FLUTICASONE PROPIONATE 50 MCG/ACT NA SUSP: 1.0000 | Freq: Every day | NASAL | Status: DC | PRN

## 2015-08-18 MED ORDER — METFORMIN HCL 850 MG PO TABS
850.0000 mg | ORAL_TABLET | Freq: Two times a day (BID) | ORAL | Status: DC
  Administered 2015-08-18: 850 mg via ORAL
  Filled 2015-08-18 (×2): qty 1

## 2015-08-18 MED ORDER — LORAZEPAM 1 MG PO TABS
1.0000 mg | ORAL_TABLET | Freq: Three times a day (TID) | ORAL | Status: DC | PRN
  Filled 2015-08-18: qty 1

## 2015-08-18 MED ORDER — PRAZOSIN HCL 2 MG PO CAPS: 2.0000 mg | ORAL_CAPSULE | Freq: Every day | ORAL | Status: DC

## 2015-08-18 MED ORDER — ALUM & MAG HYDROXIDE-SIMETH 200-200-20 MG/5ML PO SUSP
30.0000 mL | ORAL | Status: DC | PRN
  Administered 2015-08-19: 30 mL via ORAL
  Filled 2015-08-18: qty 30

## 2015-08-18 MED ORDER — ONDANSETRON HCL 4 MG PO TABS: 4.0000 mg | ORAL_TABLET | Freq: Three times a day (TID) | ORAL | Status: DC | PRN

## 2015-08-18 MED ORDER — OLANZAPINE 10 MG PO TABS
10.0000 mg | ORAL_TABLET | Freq: Every day | ORAL | Status: DC
  Administered 2015-08-19: 10 mg via ORAL
  Filled 2015-08-18: qty 1

## 2015-08-18 MED ORDER — ZOLPIDEM TARTRATE 5 MG PO TABS: 5.0000 mg | ORAL_TABLET | Freq: Every evening | ORAL | Status: DC | PRN

## 2015-08-18 MED ORDER — ACETAMINOPHEN 325 MG PO TABS: 650.0000 mg | ORAL_TABLET | ORAL | Status: DC | PRN

## 2015-08-18 MED ORDER — OLANZAPINE 5 MG PO TABS: 5.0000 mg | ORAL_TABLET | Freq: Every day | ORAL | Status: DC

## 2015-08-18 MED ORDER — LITHIUM CARBONATE ER 450 MG PO TBCR
450.0000 mg | EXTENDED_RELEASE_TABLET | Freq: Every day | ORAL | Status: DC
  Filled 2015-08-18: qty 1

## 2015-08-18 MED ORDER — IBUPROFEN 200 MG PO TABS
600.0000 mg | ORAL_TABLET | Freq: Three times a day (TID) | ORAL | Status: DC | PRN
Start: 2015-08-18 — End: 2015-08-19

## 2015-08-18 MED ORDER — LITHIUM CARBONATE ER 450 MG PO TBCR
900.0000 mg | EXTENDED_RELEASE_TABLET | Freq: Every day | ORAL | Status: DC
  Administered 2015-08-19: 900 mg via ORAL
  Filled 2015-08-18 (×2): qty 2

## 2015-08-18 MED ORDER — FLUOXETINE HCL 20 MG PO CAPS
20.0000 mg | ORAL_CAPSULE | Freq: Every day | ORAL | Status: DC
  Administered 2015-08-19: 20 mg via ORAL
  Filled 2015-08-18: qty 1

## 2015-08-18 MED ORDER — METFORMIN HCL 500 MG PO TABS
1000.0000 mg | ORAL_TABLET | Freq: Every day | ORAL | Status: DC
  Administered 2015-08-19: 1000 mg via ORAL
  Filled 2015-08-18: qty 2

## 2015-08-18 MED ORDER — METFORMIN HCL 500 MG PO TABS
500.0000 mg | ORAL_TABLET | Freq: Every day | ORAL | Status: DC
  Administered 2015-08-19: 500 mg via ORAL
  Filled 2015-08-18 (×2): qty 1

## 2015-08-18 NOTE — ED Notes (Signed)
Staffing called and notified of [facility] case.  
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
## 2015-08-18 NOTE — ED Notes (Signed)
Unable to collect labs patient is not in the room 

## 2015-08-18 NOTE — ED Provider Notes (Signed)
CSN: [id number]     Arrival date & time 08/18/15  1538 History   First MD Initiated Contact with Patient 08/18/15 1622     Chief Complaint  Patient presents with  . Suicidal     (Consider location/radiation/quality/duration/timing/severity/associated sxs/prior Treatment) HPI   Holly [name] is a 20 y.o. female, with a history of Bipolar, DM, MR, and MDD, presenting to the ED with suicidal ideations and auditory hallucinations that began today. Pt states she is a "cutter" and this is how she would commit suicide if she did it. 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 Pt states they would not bring her in for evaluation so she ran away and called the police, who then brought her here. Pt states, "The voices were telling me 'Kill Holly [name]. Get rid of the body. 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 Pt voices compliance with her prescribe medications. Denies using alcohol, illicit drugs, or inappropriate use of prescription medications. Pt also endorses visual hallucinations. States, "I saw my mom today. She's dead." Pt sees Holly [name] for her mental health needs. Pt denies physical complaints. Pt states she no longer has menstrual periods due to DepoProvera.     Past Medical History  Diagnosis Date  . Bipolar 1 disorder (HCC)   . Gluten free diet   . ADHD (attention deficit hyperactivity disorder)   . PTSD (post-traumatic stress disorder)   . 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 Mood disorder (HCC)   . Diabetes mellitus   . Obesity   . Depression   . Hypothyroidism    Past Surgical History  Procedure Laterality Date  . None    . No past surgeries     No family history on file. Social History  Substance Use Topics  . Smoking status: Never Smoker   . Smokeless tobacco: Never Used  . Alcohol Use: No   OB History    No data available      Review of Systems  Constitutional: Negative for fever and chills.  Respiratory: Negative for cough and shortness of breath.   Cardiovascular: Negative for chest pain.  Gastrointestinal: Negative for nausea, vomiting and abdominal pain.  Genitourinary: Negative for dysuria.  Skin: Negative for color change and pallor.  Neurological: Negative for dizziness, weakness, light-headedness, numbness and headaches.  Psychiatric/Behavioral: Positive for suicidal ideas, hallucinations, self-injury and dysphoric mood.  All other systems reviewed and are negative.     Allergies  Gluten meal and Lactose intolerance (gi)  Home Medications   Prior to Admission medications   Medication Sig Start Date End Date Taking? Authorizing Provider  acidophilus (RISAQUAD) CAPS capsule Take 1 capsule by mouth 3 (three) times daily as needed. Patient taking differently: Take 1 capsule by mouth 3 (three) times daily as needed (lactose intolerence).  12/14/13  Yes Charm Rings, NP  benztropine (COGENTIN) 1 MG tablet Take 1 mg by mouth at bedtime.   Yes Historical Provider, MD  clindamycin-benzoyl peroxide (BENZACLIN WITH PUMP) gel Apply 1 application topically daily as needed (facial acne).   Yes Historical Provider, MD  doxepin (SINEQUAN) 50 MG capsule Take 50 mg by mouth at bedtime.   Yes Historical Provider, MD  FLUoxetine (PROZAC) 20 MG capsule Take 1 capsule (20 mg total) by mouth daily. 02/24/15  Yes Adonis Brook, NP  fluticasone Aleda Grana)  50 MCG/ACT nasal spray Place 1 spray into both nostrils daily as needed for allergies. 02/24/15  Yes Adonis BrookSheila Agustin, NP  lactase (LACTAID) 3000 units tablet Take 3,000 Units by mouth 4 (four) times daily as needed (lactos intolerance).   Yes Historical Provider, MD  levothyroxine (SYNTHROID, LEVOTHROID) 100 MCG tablet Take 1 tablet (100 mcg total) by mouth daily before breakfast. 02/24/15  Yes Adonis BrookSheila Agustin, NP  lithium carbonate (ESKALITH) 450 MG CR tablet Take 1  tablet (450 mg total) by mouth every morning. 02/24/15  Yes Adonis BrookSheila Agustin, NP  lithium carbonate (ESKALITH) 450 MG CR tablet Take 2 tablets (900 mg total) by mouth at bedtime. 02/24/15  Yes Adonis BrookSheila Agustin, NP  medroxyPROGESTERone (DEPO-PROVERA) 150 MG/ML injection Inject 1 mL (150 mg total) into the muscle every 3 (three) months. Last dose 02/23/2015 02/24/15  Yes Adonis BrookSheila Agustin, NP  metFORMIN (GLUCOPHAGE) 500 MG tablet Take 500-1,000 mg by mouth every morning. Take 500mg  in the morning and 1000mg s in the evening   Yes Historical Provider, MD  methylphenidate 18 MG PO CR tablet Take 18 mg by mouth every morning.   Yes Historical Provider, MD  OLANZapine (ZYPREXA) 5 MG tablet Take 1 tablet (5 mg total) by mouth daily with breakfast. Then take 1 tablet (5 mg ) at lunch.  Then take 2 tablets (10 mg) at dinner. Patient taking differently: Take 5 mg by mouth as directed. Take 5mg s in the morning, 5mg s at 4pm, and 10mg s at bedtime 02/24/15  Yes Adonis BrookSheila Agustin, NP  omeprazole (PRILOSEC) 20 MG capsule Take 20 mg by mouth daily.   Yes Historical Provider, MD  polycarbophil (FIBERCON) 625 MG tablet Take 625 mg by mouth daily.   Yes Historical Provider, MD  Probiotic Product (ALIGN) 4 MG CAPS Take 4 mg by mouth every evening.   Yes Historical Provider, MD   BP 106/63 mmHg  Pulse 69  Temp(Src) 98.5 F (36.9 C) (Oral)  Resp 18  SpO2 99% Physical Exam  Constitutional: She is oriented to person, place, and time. She appears well-developed and well-nourished. No distress.  HENT:  Head: Normocephalic and atraumatic.  Eyes: Conjunctivae and EOM are normal. Pupils are equal, round, and reactive to light.  Neck: Normal range of motion. Neck supple.  Cardiovascular: Normal rate, regular rhythm, normal heart sounds and intact distal pulses.   Pulmonary/Chest: Effort normal and breath sounds normal. No respiratory distress.  Abdominal: Soft. There is no tenderness. There is no guarding.  Musculoskeletal: She  exhibits no edema or tenderness.  Lymphadenopathy:    She has no cervical adenopathy.  Neurological: She is alert and oriented to person, place, and time. She has normal reflexes.  No sensory deficits. Strength 5/5 in all extremities. No gait disturbance. Coordination intact. Cranial nerves III-XII grossly intact. No facial droop.   Skin: Skin is warm and dry. She is not diaphoretic.  Psychiatric: Her speech is normal and behavior is normal. She exhibits a depressed mood. She expresses suicidal ideation. She expresses suicidal plans.  Nursing note and vitals reviewed.   ED Course  Procedures (including critical care time) Labs Review Labs Reviewed  COMPREHENSIVE METABOLIC PANEL - Abnormal; Notable for the following:    CO2 19 (*)    Glucose, Bld 129 (*)    ALT 8 (*)    Total Bilirubin 0.1 (*)    All other components within normal limits  CBC WITH DIFFERENTIAL/PLATELET - Abnormal; Notable for the following:    Hemoglobin 11.9 (*)    Platelets 412 (*)  All other components within normal limits  ACETAMINOPHEN LEVEL - Abnormal; Notable for the following:    Acetaminophen (Tylenol), Serum <10 (*)    All other components within normal limits  URINALYSIS, ROUTINE W REFLEX MICROSCOPIC (NOT AT [facility]) - Abnormal; Notable for the following:    APPearance CLOUDY (*)    Hgb urine dipstick SMALL (*)    Leukocytes, UA MODERATE (*)    All other components within normal limits  URINE MICROSCOPIC-ADD ON - Abnormal; Notable for the following:    Squamous Epithelial / LPF 6-30 (*)    Bacteria, UA MANY (*)    All other components within normal limits  ETHANOL  URINE RAPID DRUG SCREEN, HOSP PERFORMED  SALICYLATE LEVEL  POC URINE PREG, ED  CBG MONITORING, ED    Imaging Review Dg Chest 2 View  08/18/2015  CLINICAL DATA:  Suicidal ideations EXAM: CHEST  2 VIEW COMPARISON:  None. FINDINGS: The heart size and mediastinal contours are within normal limits. Both lungs are clear. The visualized  skeletal structures are unremarkable. IMPRESSION: No active cardiopulmonary disease. Electronically Signed   By: Alcide Clever M.D.   On: 08/18/2015 17:12   I have personally reviewed and evaluated these images and lab results as part of my medical decision-making.   EKG Interpretation None      MDM   Final diagnoses:  Suicidal ideations  Hallucinations    Holly [name] presents with suicidal ideations and auditory/visual hallucinations that began today.  Patient has no current physical complaints. TTS consult place. Medical clearance orders placed. Patient placed in psych hold. Home meds ordered. Repeat of vitals ordered multiple times without compliance. Manual pulse was 96. Probable UTI noted. Treatment initiated.   Filed Vitals:   08/18/15 1555 08/19/15 0045  BP: 120/78 106/63  Pulse: 126 69  Temp: 98.3 F (36.8 C) 98.5 F (36.9 C)  TempSrc: Oral Oral  Resp:  18  SpO2: 96% 99%     Anselm Pancoast, PA-C 08/19/15 0048  Arby Barrette, MD 08/19/15 1946

## 2015-08-18 NOTE — ED Notes (Signed)
Per pt, states she is having auditory hallucinations, seeing things and is suicidal-ran away from day program because they wouldn't help here

## 2015-08-19 ENCOUNTER — Encounter (HOSPITAL_COMMUNITY): Payer: Self-pay

## 2015-08-19 ENCOUNTER — Inpatient Hospital Stay (HOSPITAL_COMMUNITY)
Admission: AD | Admit: 2015-08-19 | Discharge: 2015-08-24 | DRG: 885 | Disposition: A | Discharge status: Home / Self Care | Payer: Medicaid Other | Source: Intra-hospital | Attending: Psychiatry | Admitting: Psychiatry

## 2015-08-19 DIAGNOSIS — F250 Schizoaffective disorder, bipolar type: Principal | ICD-10-CM | POA: Diagnosis present

## 2015-08-19 DIAGNOSIS — R45850 Homicidal ideations: Secondary | ICD-10-CM | POA: Diagnosis present

## 2015-08-19 DIAGNOSIS — E119 Type 2 diabetes mellitus without complications: Secondary | ICD-10-CM | POA: Diagnosis present

## 2015-08-19 DIAGNOSIS — R443 Hallucinations, unspecified: Secondary | ICD-10-CM | POA: Diagnosis not present

## 2015-08-19 DIAGNOSIS — R45851 Suicidal ideations: Secondary | ICD-10-CM | POA: Diagnosis present

## 2015-08-19 DIAGNOSIS — F4310 Post-traumatic stress disorder, unspecified: Secondary | ICD-10-CM | POA: Diagnosis not present

## 2015-08-19 DIAGNOSIS — F70 Mild intellectual disabilities: Secondary | ICD-10-CM | POA: Diagnosis not present

## 2015-08-19 DIAGNOSIS — F319 Bipolar disorder, unspecified: Secondary | ICD-10-CM | POA: Diagnosis present

## 2015-08-19 LAB — PREGNANCY, URINE: Preg Test, Ur: NEGATIVE

## 2015-08-19 LAB — CBG MONITORING, ED: Glucose-Capillary: 100 mg/dL — ABNORMAL HIGH (ref 65–99)

## 2015-08-19 MED ORDER — ALUM & MAG HYDROXIDE-SIMETH 200-200-20 MG/5ML PO SUSP
30.0000 mL | ORAL | Status: DC | PRN
  Administered 2015-08-21: 30 mL via ORAL
  Filled 2015-08-19: qty 30

## 2015-08-19 MED ORDER — LITHIUM CARBONATE ER 450 MG PO TBCR
450.0000 mg | EXTENDED_RELEASE_TABLET | Freq: Two times a day (BID) | ORAL | Status: DC
  Administered 2015-08-19: 450 mg via ORAL
  Filled 2015-08-19 (×2): qty 1

## 2015-08-19 MED ORDER — SULFAMETHOXAZOLE-TRIMETHOPRIM 800-160 MG PO TABS
1.0000 | ORAL_TABLET | Freq: Two times a day (BID) | ORAL | Status: DC
  Administered 2015-08-19: 1 via ORAL
  Filled 2015-08-19: qty 1

## 2015-08-19 MED ORDER — ACETAMINOPHEN 325 MG PO TABS
650.0000 mg | ORAL_TABLET | Freq: Four times a day (QID) | ORAL | Status: DC | PRN
  Administered 2015-08-21: 650 mg via ORAL
  Filled 2015-08-19: qty 2

## 2015-08-19 MED ORDER — SULFAMETHOXAZOLE-TRIMETHOPRIM 800-160 MG PO TABS
1.0000 | ORAL_TABLET | Freq: Two times a day (BID) | ORAL | Status: AC
  Administered 2015-08-19 – 2015-08-21 (×4): 1 via ORAL
  Filled 2015-08-19 (×5): qty 1

## 2015-08-19 MED ORDER — OLANZAPINE 10 MG PO TABS
10.0000 mg | ORAL_TABLET | Freq: Two times a day (BID) | ORAL | Status: DC
  Administered 2015-08-19: 10 mg via ORAL
  Filled 2015-08-19: qty 1

## 2015-08-19 MED ORDER — MAGNESIUM HYDROXIDE 400 MG/5ML PO SUSP
30.0000 mL | Freq: Every day | ORAL | Status: DC | PRN
  Administered 2015-08-21: 30 mL via ORAL
  Filled 2015-08-19: qty 30

## 2015-08-19 MED ORDER — METFORMIN HCL 500 MG PO TABS
1000.0000 mg | ORAL_TABLET | Freq: Every day | ORAL | Status: DC
  Administered 2015-08-20 – 2015-08-24 (×5): 1000 mg via ORAL
  Filled 2015-08-19 (×6): qty 2

## 2015-08-19 MED ORDER — METFORMIN HCL 500 MG PO TABS
500.0000 mg | ORAL_TABLET | Freq: Every day | ORAL | Status: DC
  Administered 2015-08-20 – 2015-08-24 (×5): 500 mg via ORAL
  Filled 2015-08-19 (×7): qty 1

## 2015-08-19 MED ORDER — FLUOXETINE HCL 20 MG PO CAPS
20.0000 mg | ORAL_CAPSULE | Freq: Every day | ORAL | Status: DC
  Administered 2015-08-20 – 2015-08-24 (×5): 20 mg via ORAL
  Filled 2015-08-19 (×7): qty 1

## 2015-08-19 MED ORDER — OLANZAPINE 10 MG PO TABS
10.0000 mg | ORAL_TABLET | Freq: Two times a day (BID) | ORAL | Status: DC
  Administered 2015-08-20: 10 mg via ORAL
  Filled 2015-08-19 (×5): qty 1

## 2015-08-19 MED ORDER — FLUTICASONE PROPIONATE 50 MCG/ACT NA SUSP: 1.0000 | Freq: Every day | NASAL | Status: DC | PRN

## 2015-08-19 MED ORDER — LITHIUM CARBONATE ER 450 MG PO TBCR
450.0000 mg | EXTENDED_RELEASE_TABLET | Freq: Two times a day (BID) | ORAL | Status: DC
  Administered 2015-08-20 – 2015-08-24 (×10): 450 mg via ORAL
  Filled 2015-08-19 (×13): qty 1

## 2015-08-19 MED ORDER — LEVOTHYROXINE SODIUM 100 MCG PO TABS
100.0000 ug | ORAL_TABLET | Freq: Every day | ORAL | Status: DC
  Administered 2015-08-20 – 2015-08-24 (×5): 100 ug via ORAL
  Filled 2015-08-19 (×7): qty 1

## 2015-08-19 NOTE — Tx Team (Signed)
Initial Interdisciplinary Treatment Plan   PATIENT STRESSORS: Financial difficulties living in group home   PATIENT STRENGTHS: Active sense of humor Communication skills Motivation for treatment/growth   PROBLEM LIST: Problem List/Patient Goals Date to be addressed Date deferred Reason deferred Estimated date of resolution  Depression 08/19/15     Suicidal thoughts with plan 08/19/15     Psychosis  08/19/15     "Learn not to cut myself" 08/19/15     "Learn how to tell the truth" 08/19/15                              DISCHARGE CRITERIA:  Improved stabilization in mood, thinking, and/or behavior Verbal commitment to aftercare and medication compliance  PRELIMINARY DISCHARGE PLAN: Outpatient therapy Medication management  PATIENT/FAMIILY INVOLVEMENT: This treatment plan has been presented to and reviewed with the patient, Merrill LynchJerrielynn N Korzeniewski.  The patient and family have been given the opportunity to ask questions and make suggestions.  Norm ParcelHeather V Walid Haig 08/19/2015, 10:05 PM

## 2015-08-19 NOTE — ED Notes (Signed)
[facility]. Patient sign for belongings. No acute distress noted.

## 2015-08-19 NOTE — ED Notes (Signed)
Bed: [facility]36 Expected date:  Expected time:  Means of arrival:  Comments: Hold for triage 3 

## 2015-08-19 NOTE — Progress Notes (Signed)
Carol AdaJerrileynn is a 20 year old female being [facility].  She came to the ED c/o SI/HI and A/V hallucinations.  [place] and has been reporting to staff that she wants to kill herself.  The staff didn't believe her and she ran away from her day program.  She has SI with plan to step on train track, cut her wrists or drink bleach.  She voiced HI towards day program staff.  She is actively hearing voices telling her to "Kill Ms. Vikki PortsValerie and get rid of her body."  The voices are also telling her to run away and hid.  She has history of Bipolar disorder.  She continues to report suicidal thoughts but they are "a little better."  She agrees to contract for safety.  She continues to report voices that tell her to harm her day program worker.  She does have a guardian and the guardian will need to sign admission paperwork.  Admission paperwork completed and signed.  Belongings searched and secured in locker # 41 (radio, earphones, 12 pk AA batteries, 2 black headbands, DVD-Ernest goes to [place], [place] AAA batteries, pair orange sandals and pair yellow earrings).  Skin assessment completed and noted old well healed self-inflicted cuts to both forearms.  Q 15 minute checks initiated for safety.  We will monitor the progress towards her goals.

## 2015-08-19 NOTE — Progress Notes (Addendum)
[place].  Assigned social worker is Roxana HiresMichelle Juchatz, [phone].  After-hours contact number is [phone]. Pt is a diabetic, she cant have dairy products, Gluten    Entered in d/c instructions  Renaye RakersBland, Veita This is your assigned Medicaid [place] access doctor If you prefer to see another Medicaid doctor other than the one on your Medicaid card PLEASE CALL DSS [phone] or [phone] [address] [place] [address] [phone] Medicaid [place] Access Covered Patient [place]: [phone] [address] [place], [place] [id number] [email or web address]://dma.ncdhhs.gov/ Use this website to assist with understanding your coverage & to renew application As a Medicaid client you MUST contact DSS/SSI each time you change address, move to another [place] county or another state to keep your address updated  [place] Medicaid Transportation to Dr appts if you are have full Medicaid: [phone], [phone]

## 2015-08-19 NOTE — Progress Notes (Addendum)
This Clinical research associatewriter was informed by the [facility] that patient needs her legal guardian to sign her in tomorrow, on 4/13.  This Clinical research associatewriter contacted Legal Guardian, Holly [name] [phone]) and learned that LG will not be available for the rest of the week and that her supervisor Holly [name] with [place] [facility] [phone]) needs to be contacted.  Writer contacted Holly [name] [phone]) and left voicemail informing that patient [facility] and that her LG needs to come and sign pt in on 4/13. TTS call back number has been provided for any further questions.  Holly [name], LCSWA Disposition staff 08/19/2015 9:49 PM

## 2015-08-19 NOTE — ED Notes (Signed)
Having patient wanded at this time, first attempt made to call report, unsuccessful, will attempt again shortly.

## 2015-08-19 NOTE — BH Assessment (Addendum)
[facility] Assessment Progress Note  Per Thedore MinsMojeed Akintayo, MD, this pt requires psychiatric hospitalization at this time.  Lillia AbedLindsay, RN, [facility]  [facility]  Pt is to be transported at 19:00.  Pt has signed Voluntary Admission and Consent for Treatment, and signed form has been faxed to [facility].  She reports that her guardian in [place].  This Clinical research associatewriter will call her to notify her and to ask her about consent to release information to Tamela OddiJo Lanita Stammen at [facility], pt's outpatient provider.  Pt's nurse, Diane, has been notified, and agrees to send original paperwork along with pt via Juel Burrowelham, and to call report to [phone].  At 15:59 this writer called Georga KaufmannMichelle Jukatz [phone]) [facility] to notify her of pt's status.  Call rolled to voicemail, stating that she will be out of the office for the rest of the week and advising listeners to call her supervisor, Toya Smothersracy Saunders [phone]).  After leaving a message for Tomie ChinaMichelle, I called French Anaracy.  Her phone also rolled to voice mail and a message was left for her as well.  Doylene Canninghomas Jovin Fester, MA Triage Specialist [phone]

## 2015-08-19 NOTE — ED Notes (Signed)
Pt oriented to room and unit.  Pt c/o audio hallucinations commanding her to "kill"  Pt contracts for safety.  She is pleasant and cooperative.  15 minute checks and video monitoring in place.

## 2015-08-19 NOTE — Consult Note (Signed)
[facility] Face-to-Face Psychiatry Consult   Reason for Consult:   suicidal/homicidal, Auditory/visual hallucination. Referring Physician:  EDP Patient Identification: Holly [name] MRN:  [id number] Principal Diagnosis: Schizoaffective disorder, bipolar type [facility]) Diagnosis:   Patient Active Problem List   Diagnosis Date Noted  . Schizoaffective disorder, bipolar type ([place]) [F25.0]     Priority: High  . Injury, self-inflicted [R15.4]   . 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 Psychosis [F29] 03/18/2014  . Bipolar disorder, curr episode mixed, severe, with psychotic features ([place]) [F31.64] 12/14/2013  . ADHD (attention deficit hyperactivity disorder) [F90.9] 01/03/2013  . PTSD (post-traumatic stress disorder) [F43.10] 01/03/2013  . Mental retardation [F79] 01/03/2013  . Agitation [R45.1] 01/03/2013    Total Time spent with patient: 45 minutes  Subjective:   Holly [name] is a 20 y.o. female patient admitted with suicidal/homicidal, Auditory/visual hallucination.  HPI:  Caucasian female, 20 years old was evaluated for suicidal and homicidal ideation with plan to kill her Program staff and getting rid of  her body.  Patient informed her Investment banker, operational that she was suicidal but nobody believed her so she ran away from the site.  Patient was brought in by [employer] who found her.  Patient reports today that she does not get along with her day Program staff.  Patient reports that she feels suicidal all the time but she is now feeling homicidal towards her program staff member MS Mateo Flow.  She is still suicidal and homicidal to wards her.  She is happy at her group home and gets along well with the owner.  She admits to numerous inpatient MH hospitalizations and states she has attempted suicide several times.  Patient states she has ingested bleach twice and have OD on her pills in the past.  She reports hearing voices telling her to kill MS Mateo Flow all the time and that she sees rain coming down as  blood.   She has been accepted for admission and we will be seeking placement at any facility with available bed.  Past Psychiatric History:  Bipolar disorder, Mental retardation, ADHD, PTSD  Risk to Self: Suicidal Ideation: Yes-Currently Present Suicidal Intent: Yes-Currently Present Is patient at risk for suicide?: Yes Suicidal Plan?: Yes-Currently Present Specify Current Suicidal Plan: cut wrist deep; drink bleach; step on a train track Access to Means: Yes Specify Access to Suicidal Means: access to sharp objects, bleach and train track What has been your use of drugs/alcohol within the last 12 months?: pt denies How many times?:  ("alot") Other Self Harm Risks: cutting Triggers for Past Attempts: Unknown, Unpredictable Intentional Self Injurious Behavior: Cutting Comment - Self Injurious Behavior: pt is an active cutter Risk to Others: Homicidal Ideation: Yes-Currently Present Thoughts of Harm to Others: Yes-Currently Present Comment - Thoughts of Harm to Others: pt reports AH telling her to kill a staff member at her day program Current Homicidal Intent: Yes-Currently Present Current Homicidal Plan: Yes-Currently Present Describe Current Homicidal Plan: Voices reportedly telling pt to kill [name] and get rid of the body Access to Homicidal Means: No Identified Victim: Ms. Shelle Iron member at day program ([employer]) History of harm to others?: No Assessment of Violence: None Noted Violent Behavior Description: none noted Does patient have access to weapons?: No Criminal Charges Pending?: No Does patient have a court date: No Prior Inpatient Therapy: Prior Inpatient Therapy: Yes Prior Therapy Dates: 2016; 2015; 2014; 2007 Prior Therapy Facilty/Provider(s): [facility] Reason for Treatment: Bipolar I Prior Outpatient Therapy: Prior Outpatient Therapy: No  Does patient have an ACCT team?: No Does patient have Intensive In-House Services?  : No Does patient  have [facility] services? : No Does patient have P4CC services?: No  Past Medical History:  Past Medical History  Diagnosis Date  . Bipolar 1 disorder ([place])   . Gluten free diet   . ADHD (attention deficit hyperactivity disorder)   . PTSD (post-traumatic stress disorder)   . 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 Mood disorder ([place])   . Diabetes mellitus   . Obesity   . Depression   . Hypothyroidism     Past Surgical History  Procedure Laterality Date  . None    . No past surgeries     Family History: No family history on file.   Family Psychiatric  History:  UNKNOWN, Patient reports she grew up in [place] from age 43. Social History:  History  Alcohol Use No     History  Drug Use No    Social History   Social History  . Marital Status: Single    Spouse Name: N/A  . Number of Children: N/A  . Years of Education: N/A   Social History Main Topics  . Smoking status: Never Smoker   . Smokeless tobacco: Never Used  . Alcohol Use: No  . Drug Use: No  . Sexual Activity: No   Other Topics Concern  . None   Social History Narrative   Additional Social History:    Allergies:   Allergies  Allergen Reactions  . Gluten Meal Other (See Comments)    Upset stomach - patient denies  . Lactose Intolerance (Gi) Nausea Only and Other (See Comments)    Upset stomach    Labs:  Results for orders placed or performed during the hospital encounter of 08/18/15 (from the past 48 hour(s))  Urine rapid drug screen (hosp performed)not at [facility]     Status: None   Collection Time: 08/18/15  4:52 PM  Result Value Ref Range   Opiates NONE DETECTED NONE DETECTED   Cocaine NONE DETECTED NONE DETECTED   Benzodiazepines NONE DETECTED NONE DETECTED   Amphetamines NONE DETECTED NONE DETECTED   Tetrahydrocannabinol NONE DETECTED NONE DETECTED   Barbiturates NONE DETECTED NONE DETECTED    Comment:        DRUG SCREEN FOR MEDICAL PURPOSES ONLY.  IF CONFIRMATION IS NEEDED FOR ANY  PURPOSE, NOTIFY LAB WITHIN 5 DAYS.        LOWEST DETECTABLE LIMITS FOR URINE DRUG SCREEN Drug Class       Cutoff (ng/mL) Amphetamine      1000 Barbiturate      200 Benzodiazepine   993 Tricyclics       716 Opiates          300 Cocaine          300 THC              50   Urinalysis, Routine w reflex microscopic (not at [facility])     Status: Abnormal   Collection Time: 08/18/15  4:52 PM  Result Value Ref Range   Color, Urine YELLOW YELLOW   APPearance CLOUDY (A) CLEAR   Specific Gravity, Urine 1.009 1.005 - 1.030   pH 6.5 5.0 - 8.0   Glucose, UA NEGATIVE NEGATIVE mg/dL   Hgb urine dipstick SMALL (A) NEGATIVE   Bilirubin Urine NEGATIVE NEGATIVE   Ketones, ur NEGATIVE NEGATIVE mg/dL   Protein, ur NEGATIVE NEGATIVE mg/dL   Nitrite NEGATIVE  NEGATIVE   Leukocytes, UA MODERATE (A) NEGATIVE  Urine microscopic-add on     Status: Abnormal   Collection Time: 08/18/15  4:52 PM  Result Value Ref Range   Squamous Epithelial / LPF 6-30 (A) NONE SEEN   WBC, UA 6-30 0 - 5 WBC/hpf   RBC / HPF 0-5 0 - 5 RBC/hpf   Bacteria, UA MANY (A) NONE SEEN  Comprehensive metabolic panel     Status: Abnormal   Collection Time: 08/18/15  5:04 PM  Result Value Ref Range   Sodium 140 135 - 145 mmol/L   Potassium 3.7 3.5 - 5.1 mmol/L   Chloride 111 101 - 111 mmol/L   CO2 19 (L) 22 - 32 mmol/L   Glucose, Bld 129 (H) 65 - 99 mg/dL   BUN 6 6 - 20 mg/dL   Creatinine, Ser 0.63 0.44 - 1.00 mg/dL   Calcium 9.3 8.9 - 10.3 mg/dL   Total Protein 7.4 6.5 - 8.1 g/dL   Albumin 4.0 3.5 - 5.0 g/dL   AST 17 15 - 41 U/L   ALT 8 (L) 14 - 54 U/L   Alkaline Phosphatase 110 38 - 126 U/L   Total Bilirubin 0.1 (L) 0.3 - 1.2 mg/dL   GFR calc non Af Amer >60 >60 mL/min   GFR calc Af Amer >60 >60 mL/min    Comment: (NOTE) The eGFR has been calculated using the CKD EPI equation. This calculation has not been validated in all clinical situations. eGFR's persistently <60 mL/min signify possible Chronic Kidney Disease.    Anion  gap 10 5 - 15  Ethanol     Status: None   Collection Time: 08/18/15  5:04 PM  Result Value Ref Range   Alcohol, Ethyl (B) <5 <5 mg/dL    Comment:        LOWEST DETECTABLE LIMIT FOR SERUM ALCOHOL IS 5 mg/dL FOR MEDICAL PURPOSES ONLY   CBC with Diff     Status: Abnormal   Collection Time: 08/18/15  5:04 PM  Result Value Ref Range   WBC 9.4 4.0 - 10.5 K/uL   RBC 3.89 3.87 - 5.11 MIL/uL   Hemoglobin 11.9 (L) 12.0 - 15.0 g/dL   HCT 36.4 36.0 - 46.0 %   MCV 93.6 78.0 - 100.0 fL   MCH 30.6 26.0 - 34.0 pg   MCHC 32.7 30.0 - 36.0 g/dL   RDW 13.8 11.5 - 15.5 %   Platelets 412 (H) 150 - 400 K/uL   Neutrophils Relative % 63 %   Neutro Abs 6.0 1.7 - 7.7 K/uL   Lymphocytes Relative 28 %   Lymphs Abs 2.6 0.7 - 4.0 K/uL   Monocytes Relative 7 %   Monocytes Absolute 0.6 0.1 - 1.0 K/uL   Eosinophils Relative 2 %   Eosinophils Absolute 0.2 0.0 - 0.7 K/uL   Basophils Relative 0 %   Basophils Absolute 0.0 0.0 - 0.1 K/uL  Acetaminophen level     Status: Abnormal   Collection Time: 08/18/15  5:04 PM  Result Value Ref Range   Acetaminophen (Tylenol), Serum <10 (L) 10 - 30 ug/mL    Comment:        THERAPEUTIC CONCENTRATIONS VARY SIGNIFICANTLY. A RANGE OF 10-30 ug/mL MAY BE AN EFFECTIVE CONCENTRATION FOR MANY PATIENTS. HOWEVER, SOME ARE BEST TREATED AT CONCENTRATIONS OUTSIDE THIS RANGE. ACETAMINOPHEN CONCENTRATIONS >150 ug/mL AT 4 HOURS AFTER INGESTION AND >50 ug/mL AT 12 HOURS AFTER INGESTION ARE OFTEN ASSOCIATED WITH TOXIC REACTIONS.   Salicylate level  Status: None   Collection Time: 08/18/15  5:04 PM  Result Value Ref Range   Salicylate Lvl <1.6 2.8 - 30.0 mg/dL  Pregnancy, urine     Status: None   Collection Time: 08/18/15  5:30 PM  Result Value Ref Range   Preg Test, Ur NEGATIVE NEGATIVE    Comment:        THE SENSITIVITY OF THIS METHODOLOGY IS >20 mIU/mL.   CBG monitoring, ED     Status: Abnormal   Collection Time: 08/19/15  8:11 AM  Result Value Ref Range    Glucose-Capillary 100 (H) 65 - 99 mg/dL    Current Facility-Administered Medications  Medication Dose Route Frequency Provider Last Rate Last Dose  . acetaminophen (TYLENOL) tablet 650 mg  650 mg Oral Q4H PRN Shawn C Joy, PA-C      . alum & mag hydroxide-simeth (MAALOX/MYLANTA) 200-200-20 MG/5ML suspension 30 mL  30 mL Oral PRN Shawn C Joy, PA-C      . FLUoxetine (PROZAC) capsule 20 mg  20 mg Oral Daily Shawn C Joy, PA-C   20 mg at 08/19/15 0122  . fluticasone (FLONASE) 50 MCG/ACT nasal spray 1 spray  1 spray Each Nare Daily PRN Shawn C Joy, PA-C      . levothyroxine (SYNTHROID, LEVOTHROID) tablet 100 mcg  100 mcg Oral QAC breakfast Lorayne Bender, PA-C   100 mcg at 08/19/15 0806  . lithium carbonate (ESKALITH) CR tablet 450 mg  450 mg Oral BID PC Cass Edinger, MD      . LORazepam (ATIVAN) tablet 1 mg  1 mg Oral Q8H PRN Shawn C Joy, PA-C      . metFORMIN (GLUCOPHAGE) tablet 1,000 mg  1,000 mg Oral Q supper Charlesetta Shanks, MD      . metFORMIN (GLUCOPHAGE) tablet 500 mg  500 mg Oral Q breakfast Charlesetta Shanks, MD   500 mg at 08/19/15 0809  . OLANZapine (ZYPREXA) tablet 10 mg  10 mg Oral BID PC Solash Tullo, MD      . ondansetron (ZOFRAN) tablet 4 mg  4 mg Oral Q8H PRN Shawn C Joy, PA-C      . sulfamethoxazole-trimethoprim (BACTRIM DS,SEPTRA DS) 800-160 MG per tablet 1 tablet  1 tablet Oral Q12H Shawn C Joy, PA-C   1 tablet at 08/19/15 0112  . zolpidem (AMBIEN) tablet 5 mg  5 mg Oral QHS PRN Lorayne Bender, PA-C       Current Outpatient Prescriptions  Medication Sig Dispense Refill  . acidophilus (RISAQUAD) CAPS capsule Take 1 capsule by mouth 3 (three) times daily as needed. (Patient taking differently: Take 1 capsule by mouth 3 (three) times daily as needed (lactose intolerence). )    . benztropine (COGENTIN) 1 MG tablet Take 1 mg by mouth at bedtime.    . clindamycin-benzoyl peroxide (BENZACLIN WITH PUMP) gel Apply 1 application topically daily as needed (facial acne).    Marland Kitchen doxepin (SINEQUAN)  50 MG capsule Take 50 mg by mouth at bedtime.    Marland Kitchen FLUoxetine (PROZAC) 20 MG capsule Take 1 capsule (20 mg total) by mouth daily. 30 capsule 0  . fluticasone (FLONASE) 50 MCG/ACT nasal spray Place 1 spray into both nostrils daily as needed for allergies. 1 g 0  . lactase (LACTAID) 3000 units tablet Take 3,000 Units by mouth 4 (four) times daily as needed (lactos intolerance).    Marland Kitchen levothyroxine (SYNTHROID, LEVOTHROID) 100 MCG tablet Take 1 tablet (100 mcg total) by mouth daily before breakfast. 30 tablet 0  .  lithium carbonate (ESKALITH) 450 MG CR tablet Take 1 tablet (450 mg total) by mouth every morning. 30 tablet 0  . lithium carbonate (ESKALITH) 450 MG CR tablet Take 2 tablets (900 mg total) by mouth at bedtime. 60 tablet 0  . medroxyPROGESTERone (DEPO-PROVERA) 150 MG/ML injection Inject 1 mL (150 mg total) into the muscle every 3 (three) months. Last dose 02/23/2015 1 mL 0  . metFORMIN (GLUCOPHAGE) 500 MG tablet Take 500-1,000 mg by mouth every morning. Take 527m in the morning and 10020m in the evening    . methylphenidate 18 MG PO CR tablet Take 18 mg by mouth every morning.    . Marland KitchenLANZapine (ZYPREXA) 5 MG tablet Take 1 tablet (5 mg total) by mouth daily with breakfast. Then take 1 tablet (5 mg ) at lunch.  Then take 2 tablets (10 mg) at dinner. (Patient taking differently: Take 5 mg by mouth as directed. Take 58m70min the morning, 58mg658mt 4pm, and 10mg22m bedtime) 120 tablet 0  . omeprazole (PRILOSEC) 20 MG capsule Take 20 mg by mouth daily.    . polycarbophil (FIBERCON) 625 MG tablet Take 625 mg by mouth daily.    . Probiotic Product (ALIGN) 4 MG CAPS Take 4 mg by mouth every evening.      Musculoskeletal: Strength & Muscle Tone: within normal limits Gait & Station: normal Patient leans: N/A  Psychiatric Specialty Exam: Review of Systems  Constitutional: Negative.   HENT: Negative.   Eyes: Negative.   Respiratory: Negative.   Cardiovascular: Negative.   Gastrointestinal:  Negative.   Genitourinary: Negative.   Musculoskeletal: Negative.   Skin: Negative.   Endo/Heme/Allergies: Negative.     Blood pressure 94/64, pulse 81, temperature 98.3 F (36.8 C), temperature source Oral, resp. rate 20, SpO2 97 %.There is no weight on file to calculate BMI.  General Appearance: Casual  Eye Contact::  Good  Speech:  Clear and Coherent and Normal Rate  Volume:  Normal  Mood:  Angry  Affect:  Congruent  Thought Process:  Coherent, Goal Directed and Intact  Orientation:  Full (Time, Place, and Person)  Thought Content:  Hallucinations: Auditory Visual  Suicidal Thoughts:  Yes.  with intent/plan  Homicidal Thoughts:  Yes.  without intent/plan  Memory:  Immediate;   Good Recent;   Good Remote;   Good  Judgement:  Poor  Insight:  Shallow  Psychomotor Activity:  Normal  Concentration:  Good  Recall:  NA  Fund of Knowledge:Fair  Language: Good  Akathisia:  No  Handed:  Right  AIMS (if indicated):     Assets:  Desire for Improvement  ADL's:  Intact  Cognition: WNL  Sleep:      Treatment Plan Summary: Daily contact with patient to assess and evaluate symptoms and progress in treatment and Medication management  Disposition:   Patient is accepted for admission and we will be seeking placement at any facility with available bed.  We have resumed her home medications.  JosepDelfin Gant PMHNP-BC 08/19/2015 2:20 PM Patient seen face-to-face for psychiatric evaluation, chart reviewed and case discussed with the physician extender and developed treatment plan. Reviewed the information documented and agree with the treatment plan. MojeeCorena Pilgrim

## 2015-08-19 NOTE — ED Notes (Signed)
Patient has one bag of belongings in locker 27. 

## 2015-08-19 NOTE — ED Notes (Signed)
Patient complaints of SI/HI/AVH. Pt indicated that she has been telling staff at her day program and at her AFL that she has been wanted to kill herself, but they didn't believe her so, today, she ran away from the day program and called the police, who brought her into the ED. Pt endorses having SI with plans to step on a train track, cut her wrist deep, or drink bleach. Pt also endorses HI towards [name] at the day program, [employer], whom she admittedly dislikes. Pt stated that voices are telling her to kill [name] and get rid of her body. Pt also stated that voices are telling her to run away and hide and don't come back.

## 2015-08-20 ENCOUNTER — Encounter (HOSPITAL_COMMUNITY): Payer: Self-pay | Admitting: Psychiatry

## 2015-08-20 DIAGNOSIS — R45851 Suicidal ideations: Secondary | ICD-10-CM

## 2015-08-20 DIAGNOSIS — F250 Schizoaffective disorder, bipolar type: Principal | ICD-10-CM

## 2015-08-20 MED ORDER — OLANZAPINE 5 MG PO TABS
5.0000 mg | ORAL_TABLET | Freq: Every morning | ORAL | Status: DC
  Administered 2015-08-21 – 2015-08-24 (×4): 5 mg via ORAL
  Filled 2015-08-20: qty 1
  Filled 2015-08-20: qty 2
  Filled 2015-08-20 (×5): qty 1

## 2015-08-20 MED ORDER — OLANZAPINE 10 MG PO TABS
10.0000 mg | ORAL_TABLET | Freq: Every day | ORAL | Status: DC
  Administered 2015-08-20 – 2015-08-23 (×4): 10 mg via ORAL
  Filled 2015-08-20 (×6): qty 1

## 2015-08-20 NOTE — Tx Team (Signed)
Interdisciplinary Treatment Plan Update (Adult)  Date:  08/20/2015   Time Reviewed:  8:30 AM   Progress in Treatment: Attending groups: Yes. Participating in groups:  Yes. Taking medication as prescribed:  Yes. Tolerating medication:  Yes. Family/Significant other contact made:  Yes Patient understands diagnosis:  Yes  As evidenced by seeking help with AH,VH, SI, HI Discussing patient identified problems/goals with staff:  Yes, see initial care plan. Medical problems stabilized or resolved:  Yes. Denies suicidal/homicidal ideation: Yes. Issues/concerns per patient self-inventory:  No. Other:  New problem(s) identified:  Discharge Plan or Barriers: see below  Reason for Continuation of Hospitalization: Hallucinations Homicidal ideation Suicidal ideation Withdrawal symptoms  Comments:  Holly [name] is a 20 y.o. female, with a history of Bipolar, DM, MR, and MDD, presenting to the ED with suicidal ideations and auditory hallucinations that began today. Pt states she is a "cutter" and this is how she would commit suicide if she did it. 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 Pt states they would not bring her in for evaluation so she ran away and called the police, who then brought her here. Pt states, "The voices were telling me '[name]. Get rid of the body. Do whatever you can to make her pay.'" Pt states that Miss Mateo Flow is a staff member at the day program.  Estimated length of stay: 4-5 days  New goal(s):  Review of initial/current patient goals per problem list:   Review of initial/current patient goals per problem list:  1. Goal(s): Patient will participate in aftercare plan   Met: Yes   Target date: 3-5 days post admission date   As evidenced by: Patient will participate within aftercare plan AEB aftercare provider and housing plan at discharge being identified. 08/20/15:   Return home, follow up outpt   2. Goal (s): Patient will exhibit decreased depressive symptoms and suicidal ideations.   Met: No   Target date: 3-5 days post admission date   As evidenced by: Patient will utilize self rating of depression at 3 or below and demonstrate decreased signs of depression or be deemed stable for discharge by MD. 08/20/15:  Rates depression a 10 today              5. Goal(s): Patient will demonstrate decreased signs of psychosis  * Met: No  * Target date: 3-5 days post admission date  * As evidenced by: Patient will demonstrate decreased frequency of AVH or return to baseline function 08/20/15:  Endorses AH-command in nature about killing a staff member at the day program-and [place]             Attendees: Patient:  08/20/2015 8:30 AM   Family:   08/20/2015 8:30 AM   Physician:  Ursula Alert, MD 08/20/2015 8:30 AM   Nursing:   Gaylan Gerold, RN 08/20/2015 8:30 AM   CSW:    Roque Lias, LCSW   08/20/2015 8:30 AM   Other:  08/20/2015 8:30 AM   Other:   08/20/2015 8:30 AM   Other:  Lars Pinks, Nurse CM 08/20/2015 8:30 AM   Other:   08/20/2015 8:30 AM   Other:  Norberto Sorenson, P4CC  08/20/2015 8:30 AM   Other:  08/20/2015 8:30 AM   Other:  08/20/2015 8:30 AM   Other:  08/20/2015 8:30 AM   Other:  08/20/2015 8:30 AM   Other:  08/20/2015 8:30 AM   Other:  08/20/2015 8:30 AM    Scribe for Treatment Team:   Trish Mage, 08/20/2015 8:30 AM

## 2015-08-20 NOTE — Progress Notes (Signed)
[facility] Group Notes:  (Nursing/MHT/Case Management/Adjunct)  Date:  08/20/2015  Time:  9:49 PM  Type of Therapy:  Psychoeducational Skills  Participation Level:  Active  Participation Quality:  Appropriate  Affect:  Appropriate  Cognitive:  Appropriate  Insight:  Limited  Engagement in Group:  Developing/Improving  Modes of Intervention:  Education  Summary of Progress/Problems: The patient verbalized that she had a good day and that she was able to meet some of her new peers. As a goal for tomorrow, she intends to "eat less" than today.   Holly CocaGOODMAN, Holly [name] 08/20/2015, 9:49 PM

## 2015-08-20 NOTE — Progress Notes (Signed)
DAR NOTE: Patient presents with pleasant mood and affect.  Endorses suicidal ideation but contracts for safety.  Denies pain, auditory and visual hallucinations.  Rates depression at 5, hopelessness at 4, and anxiety at 5.  Maintained on routine safety checks.  Medications given as prescribed.  Support and encouragement offered as needed.  Attended group and participated.  States goal for today is "how to stop cutting and find ways to cope with it."  Patient observed socializing with peers in the dayroom.  Antibiotic therapy continues.  No adverse effect noted.  Offered no complaint.

## 2015-08-20 NOTE — [facility] Suicide Risk Assessment (Signed)
[facility] INPATIENT:  Family/Significant Other Suicide Prevention Education  Suicide Prevention Education:  Education Completed; Ms Lewie Loronmison, [phone], caregiver/ALF manager has been identified by the patient as the family member/significant other with whom the patient will be residing, and identified as the person(s) who will aid the patient in the event of a mental health crisis (suicidal ideations/suicide attempt).  With written consent from the patient, the family member/significant other has been provided the following suicide prevention education, prior to the and/or following the discharge of the patient.  The suicide prevention education provided includes the following:  Suicide risk factors  Suicide prevention and interventions  National Suicide Hotline telephone number  [facility] assessment telephone number  [facility] Assistance 911  [facility] and/or [facility]idential Mobile Crisis [facility] telephone number  Request made of family/significant other to:  Remove weapons (e.g., guns, rifles, knives), all items previously/currently identified as safety concern.    Remove drugs/medications (over-the-counter, prescriptions, illicit drugs), all items previously/currently identified as a safety concern.  The family member/significant other verbalizes understanding of the suicide prevention education information provided.  The family member/significant other agrees to remove the items of safety concern listed above.  Daryel Geraldorth, Aspynn Clover B 08/20/2015, 4:40 PM
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
## 2015-08-21 LAB — LIPID PANEL
Cholesterol: 126 mg/dL (ref 0–200)
HDL: 36 mg/dL — ABNORMAL LOW (ref >40)
LDL Cholesterol: 44 mg/dL (ref 0–99)
Total CHOL/HDL Ratio: 3.5 RATIO
Triglycerides: 231 mg/dL — ABNORMAL HIGH (ref <150)
VLDL: 46 mg/dL — ABNORMAL HIGH (ref 0–40)

## 2015-08-21 LAB — TSH: TSH: 0.771 u[IU]/mL (ref 0.350–4.500)

## 2015-08-21 LAB — LITHIUM LEVEL: Lithium Lvl: 0.28 mmol/L — ABNORMAL LOW (ref 0.60–1.20)
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
## 2015-08-21 NOTE — Progress Notes (Signed)
Adult Psychoeducational Group Note  Date:  08/21/2015 Time:  8:30 PM  Group Topic/Focus:  Wrap-Up Group:   The focus of this group is to help patients review their daily goal of treatment and discuss progress on daily workbooks.  Participation Level:  Active  Participation Quality:  Appropriate  Affect:    Cognitive:  Appropriate  Insight: Appropriate  Engagement in Group:  Engaged  Modes of Intervention:  Discussion  Additional CommentsThe patient expressed that she had a good day.The patient also said that she attended group.  Octavio Mannshigpen, Zariya Minner Lee 08/21/2015, 8:30 PM

## 2015-08-21 NOTE — [facility] Group Notes (Signed)
[facility] LCSW Group Therapy  08/21/2015 1:04 PM  Type of Therapy:  Group Therapy  Participation Level:  Active  Participation Quality:  Appropriate, Sharing and Supportive  Affect:  Appropriate  Cognitive:  Appropriate  Insight:  Developing/Improving  Engagement in Therapy:  Developing/Improving  Modes of Intervention:  Discussion, Exploration and Support  Summary of Progress/Problems: Chaplain led group explored concept of hope and its relevance to mental health recovery. Patients explored themes including what matters to them personally, how others responses are similar/different, and what they are hopeful for. Group members discussed relevance of social supports, innter strength and using their own stories to craft a recovery path. Patient identified current "foster mother" as significant source of hope.  Has been in placement w current person 'the longest I have ever been anywhere.'"  Also spoke of increased hope resulting from recent reconnection w bio mother. Hope means safety and security to patient.   Santa GeneraAnne Kaitrin Seybold, LCSW Lead Clinical Social Worker Phone: [phone]   Sallee LangeCunningham, Shauntay Brunelli C 08/21/2015, 1:04 PM

## 2015-08-21 NOTE — Progress Notes (Signed)
DAR NOTE: Patient presents with anxious mood and affect.  Patient is calm and pleasant on approach.  Endorses suicidal and homicidal thoughts but contracts for safety.  Patient reports decrease in these thoughts.  Denies pain, auditory and visual hallucinations.  Rates depression at 4, hopelessness at 4, and anxiety at 4.  Maintained on routine safety checks.  Medications given as prescribed.  Support and encouragement offered as needed.  Attended group and participated.  States goal for today is "controlling my attitude."  Patient observed socializing with peers in the dayroom.  Antibiotic therapy continues.  No adverse reaction noted.  Offered no complaint.

## 2015-08-21 NOTE — Progress Notes (Signed)
[facility] MD Progress Note  08/21/2015 5:39 PM Holly [name]  MRN:  [id number] Subjective:  Patient reports she is feeling better today. States she has stayed busy by writing on her notebook.  Denies medication side effects. Objective : I have discussed case with staff and have met with patient. Patient reports partial improvement compared to admission and does present with improved mood and range of affect. Calm and cooperative on approach.  She has been going to groups, and has been participative in milieu.  No disruptive or agitated behaviors on unit.  Today less focused on stressors , states she feels " calmer".  Denies medication side effects - no akathisia, no noticeable tremors noted.  Labs- TSH Normal, lithium serum level low - 0.28 , Lipid panel remarkable for hypertriglyceridemia, and slightly decreased HDL  Principal Problem: Schizoaffective disorder, bipolar type ([place]) Diagnosis:   Patient Active Problem List   Diagnosis Date Noted  . Hallucinations [R44.3]   . Suicidal ideations [R45.851]   . Injury, self-inflicted [T01.6]   . Schizoaffective disorder, bipolar type ([place]) [F25.0]   . 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 Psychosis [F29] 03/18/2014  . Bipolar disorder, curr episode mixed, severe, with psychotic features ([place]) [F31.64] 12/14/2013  . ADHD (attention deficit hyperactivity disorder) [F90.9] 01/03/2013  . PTSD (post-traumatic stress disorder) [F43.10] 01/03/2013  . Mental retardation [F79] 01/03/2013  . Agitation [R45.1] 01/03/2013   Total Time spent with patient: 20 minutes    Past Medical History:  Past Medical History  Diagnosis Date  . Bipolar 1 disorder ([place])   . Gluten free diet   . ADHD (attention deficit hyperactivity disorder)   . PTSD (post-traumatic stress disorder)   . 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 Mood disorder ([place])   . Diabetes mellitus   . Obesity   . Depression   . Hypothyroidism     Past Surgical History  Procedure  Laterality Date  . None    . No past surgeries     Family History: History reviewed. No pertinent family history.  Social History:  History  Alcohol Use No     History  Drug Use No    Social History   Social History  . Marital Status: Single    Spouse Name: N/A  . Number of Children: N/A  . Years of Education: N/A   Social History Main Topics  . Smoking status: Never Smoker   . Smokeless tobacco: Never Used  . Alcohol Use: No  . Drug Use: No  . Sexual Activity: No   Other Topics Concern  . None   Social History Narrative   Additional Social History:   Sleep: Good  Appetite:  Good  Current Medications: Current Facility-Administered Medications  Medication Dose Route Frequency Provider Last Rate Last Dose  . acetaminophen (TYLENOL) tablet 650 mg  650 mg Oral Q6H PRN Delfin Gant, NP   650 mg at 08/21/15 0933  . alum & mag hydroxide-simeth (MAALOX/MYLANTA) 200-200-20 MG/5ML suspension 30 mL  30 mL Oral Q4H PRN Delfin Gant, NP      . FLUoxetine (PROZAC) capsule 20 mg  20 mg Oral Daily Delfin Gant, NP   20 mg at 08/21/15 0827  . fluticasone (FLONASE) 50 MCG/ACT nasal spray 1 spray  1 spray Each Nare Daily PRN Delfin Gant, NP      . levothyroxine (SYNTHROID, LEVOTHROID) tablet 100 mcg  100 mcg Oral QAC breakfast Delfin Gant, NP   100  mcg at 08/21/15 0623  . lithium carbonate (ESKALITH) CR tablet 450 mg  450 mg Oral BID PC Delfin Gant, NP   450 mg at 08/21/15 0827  . magnesium hydroxide (MILK OF MAGNESIA) suspension 30 mL  30 mL Oral Daily PRN Delfin Gant, NP   30 mL at 08/21/15 1644  . metFORMIN (GLUCOPHAGE) tablet 1,000 mg  1,000 mg Oral Q supper Delfin Gant, NP   1,000 mg at 08/21/15 1646  . metFORMIN (GLUCOPHAGE) tablet 500 mg  500 mg Oral Q breakfast Delfin Gant, NP   500 mg at 08/21/15 0827  . OLANZapine (ZYPREXA) tablet 10 mg  10 mg Oral QHS Jenne Campus, MD   10 mg at 08/20/15 2107  . OLANZapine  (ZYPREXA) tablet 5 mg  5 mg Oral q morning - 10a Jenne Campus, MD   5 mg at 08/21/15 0932    Lab Results:  Results for orders placed or performed during the hospital encounter of 08/19/15 (from the past 48 hour(s))  Lipid panel     Status: Abnormal   Collection Time: 08/21/15  6:16 AM  Result Value Ref Range   Cholesterol 126 0 - 200 mg/dL   Triglycerides 231 (H) <150 mg/dL   HDL 36 (L) >40 mg/dL   Total CHOL/HDL Ratio 3.5 RATIO   VLDL 46 (H) 0 - 40 mg/dL   LDL Cholesterol 44 0 - 99 mg/dL    Comment:        Total Cholesterol/HDL:CHD Risk Coronary Heart Disease Risk Table                     Men   Women  1/2 Average Risk   3.4   3.3  Average Risk       5.0   4.4  2 X Average Risk   9.6   7.1  3 X Average Risk  23.4   11.0        Use the calculated Patient Ratio above and the CHD Risk Table to determine the patient's CHD Risk.        ATP III CLASSIFICATION (LDL):  <100     mg/dL   Optimal  100-129  mg/dL   Near or Above                    Optimal  130-159  mg/dL   Borderline  160-189  mg/dL   High  >190     mg/dL   Very High Performed at [facility]   TSH     Status: None   Collection Time: 08/21/15  6:16 AM  Result Value Ref Range   TSH 0.771 0.350 - 4.500 uIU/mL    Comment: Performed at [facility]  Lithium level     Status: Abnormal   Collection Time: 08/21/15  6:16 AM  Result Value Ref Range   Lithium Lvl 0.28 (L) 0.60 - 1.20 mmol/L    Comment: Performed at [facility]    Blood Alcohol level:  Lab Results  Component Value Date   [facility] <5 08/18/2015   ETH <5 02/19/2015    Physical Findings: AIMS: Facial and Oral Movements Muscles of Facial Expression: None, normal Lips and Perioral Area: None, normal Jaw: None, normal Tongue: None, normal,Extremity Movements Upper (arms, wrists, hands, fingers): None, normal Lower (legs, knees, ankles, toes): None, normal, Trunk Movements Neck, shoulders, hips: None,  normal, Overall Severity Severity of abnormal movements (  highest score from questions above): None, normal Incapacitation due to abnormal movements: None, normal Patient's awareness of abnormal movements (rate only patient's report): No Awareness, Dental Status Current problems with teeth and/or dentures?: No Does patient usually wear dentures?: No  CIWA:    COWS:     Musculoskeletal: Strength & Muscle Tone: within normal limits Gait & Station: normal Patient leans: N/A  Psychiatric Specialty Exam: ROS denies headache, denies chest pain, denies shortness of breath, no vomiting   Blood pressure 120/94, pulse 123, temperature 98.8 F (37.1 C), temperature source Oral, resp. rate 16, height 5' 3"  (1.6 m), weight 219 lb 8 oz (99.565 kg), SpO2 99 %.Body mass index is 38.89 kg/(m^2).  General Appearance: Fairly Groomed  Engineer, water::  Good  Speech:  Normal Rate  Volume:  Normal  Mood:  states feeling better today, minimizes depression at this time  Affect:  more reactive, calmer than on admission   Thought Process:  generally goal directed   Orientation:  Other:  fully alert and attentive   Thought Content:  denies hallucinations, no delusions, not internally preoccupied   Suicidal Thoughts:  No at this time denies any active suicidal ideations, denies any self injurious ideations, and denies any violent or homicidal ideations -specifically denies any ongoing homicidal ideations towards  Day program staff member- see admit note   Homicidal Thoughts:  No  Memory:  recent and remote grossly intact   Judgement:  Improving   Insight:  Improving   Psychomotor Activity:  Normal  Concentration:  Good  Recall:  Good  Fund of Knowledge:Good  Language: Good  Akathisia:  Negative  Handed:  Right  AIMS (if indicated):     Assets:  Desire for Improvement Resilience  ADL's:  Intact  Cognition: WNL  Sleep:  Number of Hours: 6.75  Assessment - patient improved compared to admission, today  presenting calm, not restless or agitated. At this time denies suicidal or homicidal ideations , is tolerating medications well. Lithium level sub-therapeutic, likely indication of sub-optimal compliance prior to admission- for now will continue Li at current dose of 900 mgrs total daily dose, which she is tolerating well . Treatment Plan Summary: Daily contact with patient to assess and evaluate symptoms and progress in treatment, Medication management, Plan inpatient treatment and medications as below  Continue to encourage group and milieu participation to work on coping skills and symptom reduction Continue Prozac  Continue Lithium 450 mgrs BID for management of bipolar disorder  Continue Zyprexa 5 mgrs QAM and 10 mgrs QHS for management of mood disorder   Treatment team working on disposition options  Neita Garnet, MD 08/21/2015, 5:39 PM

## 2015-08-21 NOTE — Progress Notes (Signed)
D: Pt complained of moderate anxiety with mild depression; states, "I'm a little anxious now just for not knowing what may happen; also a little depressed at this time." Pt denies pain, SI, HI or AVH. Pt remained respectful, calm and cooperative through the shift assessment. A: Medications offered as prescribed.  Support, encouragement, and safe environment provided.  15-minute safety checks continue. R: Pt was med compliant.  Pt attended group. Safety checks continue.

## 2015-08-21 NOTE — [facility] Group Notes (Signed)
[facility]   08/21/2015 10:48 AM  Participation Quality:  Engaged  Mood/Affect:  Flat  Depression Rating:  4  Anxiety Rating:  4  Thoughts of Suicide:  No Will you contract for safety?   NA  Current AVH:  No  Plan for Discharge/Comments:  No complaints.  States she slept well and likes the food.  Wanted to retell the story of how she ended up here.  But also states she plans to sit down with staff "and have an adult conversation about how we can get along better."  Will return to same ALF-asked me for phone number os she could check in.  Transportation Means:   Supports:  Daryel GeraldNorth, Skye Rodarte B

## 2015-08-22 LAB — HEMOGLOBIN A1C
Hgb A1c MFr Bld: 5.6 % (ref 4.8–5.6)
Mean Plasma Glucose: 114 mg/dL

## 2015-08-22 LAB — PROLACTIN: Prolactin: 48.3 ng/mL — ABNORMAL HIGH (ref 4.8–23.3)

## 2015-08-22 MED ORDER — ONDANSETRON HCL 4 MG PO TABS
4.0000 mg | ORAL_TABLET | Freq: Once | ORAL | Status: AC
  Administered 2015-08-22: 4 mg via ORAL
  Filled 2015-08-22 (×2): qty 1

## 2015-08-22 MED ORDER — HYDROXYZINE HCL 25 MG PO TABS
25.0000 mg | ORAL_TABLET | Freq: Three times a day (TID) | ORAL | Status: DC | PRN
  Administered 2015-08-22 (×2): 25 mg via ORAL
  Filled 2015-08-22: qty 1

## 2015-08-22 NOTE — [facility] Group Notes (Signed)
[facility] Group Notes:  (Nursing/MHT/Case Management/Adjunct)  Date:  08/22/2015  Time:  1:31 PM  Type of Therapy:  Nurse Education  Participation Level:  Active  Participation Quality:  Appropriate and Attentive  Affect:  Appropriate  Cognitive:  Alert and Appropriate  Insight:  Appropriate and Good  Engagement in Group:  Engaged  Modes of Intervention:  Activity, Discussion and Education  Summary of Progress/Problems: Topic was on healthy coping skills. Discussed the importance of learning and maintaining new coping skills that leads to a healthy lifestyle.  Patient was attentive and receptive.      Mickie Baillizabeth O Iwenekha 08/22/2015, 1:31 PM

## 2015-08-22 NOTE — Progress Notes (Signed)
[facility]  08/22/2015 3:57 PM Holly [name]  MRN:  [id number] Subjective:  Patient reports " I am ready to be discharged, I feel fine"  Objective : Holly [name] is awake, alert and oriented X3 , found attending group session.  Denies suicidal or homicidal ideation. Denies auditory or visual hallucination and does not appear to be responding to internal stimuli. Patient reports interacting well with staff and others. Patient reports she is medication compliant without mediation side effects. Report learning new coping skills to color and journal. States her depression 2/10. Patient reports some nausea with meals during the day however states she has an good appetite and states she is resting well.  Support, encouragement and reassurance was provided.   Principal Problem: Schizoaffective disorder, bipolar type ([facility]) Diagnosis:   Patient Active Problem List   Diagnosis Date Noted  . Hallucinations [R44.3]   . Suicidal ideations [R45.851]   . Injury, self-inflicted [F48.9]   . Schizoaffective disorder, bipolar type ([facility]) [F25.0]   . 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 Psychosis [F29] 03/18/2014  . Bipolar disorder, curr episode mixed, severe, with psychotic features ([facility]) [F31.64] 12/14/2013  . ADHD (attention deficit hyperactivity disorder) [F90.9] 01/03/2013  . PTSD (post-traumatic stress disorder) [F43.10] 01/03/2013  . Mental retardation [F79] 01/03/2013  . Agitation [R45.1] 01/03/2013   Total Time spent with patient: 20 minutes    Past Medical History:  Past Medical History  Diagnosis Date  . Bipolar 1 disorder ([facility])   . Gluten free diet   . ADHD (attention deficit hyperactivity disorder)   . PTSD (post-traumatic stress disorder)   . 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 Mood disorder ([facility])   . Diabetes mellitus   . Obesity   . Depression   . Hypothyroidism     Past Surgical History  Procedure Laterality Date  . None    . No past surgeries      Family History: History reviewed. No pertinent family history.  Social History:  History  Alcohol Use No     History  Drug Use No    Social History   Social History  . Marital Status: Single    Spouse Name: N/A  . Number of Children: N/A  . Years of Education: N/A   Social History Main Topics  . Smoking status: Never Smoker   . Smokeless tobacco: Never Used  . Alcohol Use: No  . Drug Use: No  . Sexual Activity: No   Other Topics Concern  . None   Social History Narrative   Additional Social History:   Sleep: Good  Appetite:  Good  Current Medications: Current Facility-Administered Medications  Medication Dose Route Frequency Provider Last Rate Last Dose  . acetaminophen (TYLENOL) tablet 650 mg  650 mg Oral Q6H PRN Earney NavyJosephine C Onuoha, NP   650 mg at 08/21/15 0933  . alum & mag hydroxide-simeth (MAALOX/MYLANTA) 200-200-20 MG/5ML suspension 30 mL  30 mL Oral Q4H PRN Earney NavyJosephine C Onuoha, NP   30 mL at 08/21/15 2245  . FLUoxetine (PROZAC) capsule 20 mg  20 mg Oral Daily Earney NavyJosephine C Onuoha, NP   20 mg at 08/22/15 0917  . fluticasone (FLONASE) 50 MCG/ACT nasal spray 1 spray  1 spray Each Nare Daily PRN Earney NavyJosephine C Onuoha, NP      . levothyroxine (SYNTHROID, LEVOTHROID) tablet 100 mcg  100 mcg Oral QAC breakfast Earney NavyJosephine C Onuoha, NP   100 mcg at 08/22/15 0608  . lithium carbonate (  ESKALITH) CR tablet 450 mg  450 mg Oral BID PC Earney Navy, NP   450 mg at 08/22/15 0917  . magnesium hydroxide (MILK OF MAGNESIA) suspension 30 mL  30 mL Oral Daily PRN Earney Navy, NP   30 mL at 08/21/15 1644  . metFORMIN (GLUCOPHAGE) tablet 1,000 mg  1,000 mg Oral Q supper Earney Navy, NP   1,000 mg at 08/21/15 1646  . metFORMIN (GLUCOPHAGE) tablet 500 mg  500 mg Oral Q breakfast Earney Navy, NP   500 mg at 08/22/15 0917  . OLANZapine (ZYPREXA) tablet 10 mg  10 mg Oral QHS Craige Cotta, MD   10 mg at 08/21/15 2100  . OLANZapine (ZYPREXA) tablet 5 mg  5 mg Oral  q morning - 10a Craige Cotta, MD   5 mg at 08/22/15 4098    Lab Results:  Results for orders placed or performed during the hospital encounter of 08/19/15 (from the past 48 hour(s))  Lipid panel     Status: Abnormal   Collection Time: 08/21/15  6:16 AM  Result Value Ref Range   Cholesterol 126 0 - 200 mg/dL   Triglycerides 119 (H) <150 mg/dL   HDL 36 (L) >14 mg/dL   Total CHOL/HDL Ratio 3.5 RATIO   VLDL 46 (H) 0 - 40 mg/dL   LDL Cholesterol 44 0 - 99 mg/dL    Comment:        Total Cholesterol/HDL:CHD Risk Coronary Heart Disease Risk Table                     Men   Women  1/2 Average Risk   3.4   3.3  Average Risk       5.0   4.4  2 X Average Risk   9.6   7.1  3 X Average Risk  23.4   11.0        Use the calculated Patient Ratio above and the CHD Risk Table to determine the patient's CHD Risk.        ATP III CLASSIFICATION (LDL):  <100     mg/dL   Optimal  782-956  mg/dL   Near or Above                    Optimal  130-159  mg/dL   Borderline  213-086  mg/dL   High  >578     mg/dL   Very High Performed at [facility]   Hemoglobin A1c     Status: None   Collection Time: 08/21/15  6:16 AM  Result Value Ref Range   Hgb A1c MFr Bld 5.6 4.8 - 5.6 %    Comment: (NOTE)         Pre-diabetes: 5.7 - 6.4         Diabetes: >6.4         Glycemic control for adults with diabetes: <7.0    Mean Plasma Glucose 114 mg/dL    Comment: (NOTE) Performed At: [facility] [address] [place], [place] [id number] Mila Homer MD [id number] Performed at [facility]   Prolactin     Status: Abnormal   Collection Time: 08/21/15  6:16 AM  Result Value Ref Range   Prolactin 48.3 (H) 4.8 - 23.3 ng/mL    Comment: (NOTE) Performed At: [facility] [address] [place], [place] [id number] Mila Homer MD [id number] Performed at [facility]  TSH     Status: None   Collection Time: 08/21/15  6:16 AM  Result  Value Ref Range   TSH 0.771 0.350 - 4.500 uIU/mL    Comment: Performed at [facility]  Lithium level     Status: Abnormal   Collection Time: 08/21/15  6:16 AM  Result Value Ref Range   Lithium Lvl 0.28 (L) 0.60 - 1.20 mmol/L    Comment: Performed at [facility]    Blood Alcohol level:  Lab Results  Component Value Date   [facility] <5 08/18/2015   ETH <5 02/19/2015    Physical Findings: AIMS: Facial and Oral Movements Muscles of Facial Expression: None, normal Lips and Perioral Area: None, normal Jaw: None, normal Tongue: None, normal,Extremity Movements Upper (arms, wrists, hands, fingers): None, normal Lower (legs, knees, ankles, toes): None, normal, Trunk Movements Neck, shoulders, hips: None, normal, Overall Severity Severity of abnormal movements (highest score from questions above): None, normal Incapacitation due to abnormal movements: None, normal Patient's awareness of abnormal movements (rate only patient's report): No Awareness, Dental Status Current problems with teeth and/or dentures?: No Does patient usually wear dentures?: No  CIWA:    COWS:     Musculoskeletal: Strength & Muscle Tone: within normal limits Gait & Station: normal Patient leans: N/A  Psychiatric Specialty Exam: Review of Systems  Gastrointestinal: Positive for nausea.  Psychiatric/Behavioral: Negative for depression and suicidal ideas. The patient is nervous/anxious.   All other systems reviewed and are negative.  denies headache, denies chest pain, denies shortness of breath, no vomiting   Blood pressure 120/101, pulse 118, temperature 98.3 F (36.8 C), temperature source Oral, resp. rate 18, height  (1.6 m), weight 99.565 kg (219 lb 8 oz), SpO2 99 %.Body mass index is 38.89 kg/(m^2).  General Appearance: Fairly Groomed  Patent attorney::  Good  Speech:  Normal Rate  Volume:  Normal  Mood:  Anxious and states feeling better today, minimizes depression at  this time  Affect:  Appropriate and Congruent  Thought Process:  generally goal directed   Orientation:  Other:  fully alert and attentive   Thought Content:  denies hallucinations, no delusions, not internally preoccupied   Suicidal Thoughts:  No at this time denies any active suicidal ideations, denies any self injurious ideations, and denies any violent or homicidal ideations  -continues to denies any ongoing homicidal ideations towards  Day program staff member- see admit note   Homicidal Thoughts:  No  Memory:  recent and remote grossly intact   Judgement:  Improving   Insight:  Improving   Psychomotor Activity:  Normal  Concentration:  Good  Recall:  Good  Fund of Knowledge:Good  Language: Good  Akathisia:  Negative  Handed:  Right  AIMS (if indicated):     Assets:  Desire for Improvement Resilience  ADL's:  Intact  Cognition: WNL  Sleep:  Number of Hours: 6.75     I agree with current treatment plan on 08/22/2015, Patient seen face-to-face for psychiatric evaluation follow-up, chart reviewed. Reviewed the information documented and agree with the treatment plan.  Treatment Plan Summary: Daily contact with patient to assess and evaluate symptoms and progress in treatment, Medication management, Plan inpatient treatment and medications as below   Continue to encourage group and milieu participation to work on coping skills and symptom reduction Start Zofran  PO PRN for nausea  Continue Prozac 20 mg PO QD for mood stabilization Continue Lithium 450 mgrs BID for management of  bipolar disorder  Continue Zyprexa 5 mgrs QAM and 10 mgrs QHS for management of mood disorder   Treatment team working on disposition options   Oneta Rack, NP 08/22/2015, 3:57 PM Agree with NP note as above  Nehemiah Massed, MD

## 2015-08-22 NOTE — Progress Notes (Signed)
Patient ID: Holly [name], female   DOB: Dec 23, 1995, 20 y.o.   MRN: [id number]   Clinical Social Work Note  At pt request, provided her with a pair of pants and a short-sleeved shirt.  Ambrose MantleMareida Grossman-Orr, LCSW 08/22/2015, 2:10 PM

## 2015-08-22 NOTE — B[facility] Group Notes (Signed)
[facility] Group Notes:  (Clinical Social Work)  08/22/2015  11:15-12:00PM  Summary of Progress/Problems:   Today's process group involved patients discussing their feelings related to being hospitalized, as well as how they can use their present feelings to create a plan to avoid future hospitalizations. The patient expressed her primary feeling about being hospitalized is that she needs help, and would rather be here receiving help than not be here and not be receiving help.  She was monopolizing at times but could be redirected.  At one point another patient who is older stated that patient could not understand her feelings about hospitalization because she has not been doing it for 20+ years, and was fairly insulting to patient.  However, patient retorted with good manners that she has had 20 hospitalizations and 97 group home placements and that qualifies her to speak about it. She was focused on wanting CSW to call [name] (caregiver?) but CSW declined to do so.  Type of Therapy:  Group Therapy - Process  Participation Level:  Active  Participation Quality:  Attentive, Monopolizing, Redirectable, Sharing and Supportive  Affect:  Blunted  Cognitive:  Alert  Insight:  Improving  Engagement in Therapy:  Engaged  Modes of Intervention:  Exploration, Discussion  Ambrose MantleMareida Grossman-Orr, LCSW 08/22/2015, 1:27 PM

## 2015-08-22 NOTE — Progress Notes (Signed)
D: Pt complained of moderate anxiety however, denies pain, depression, SI, HI or AVH. She states, "I spoke to the chaplain today and he told me that God has forgiven me all my sins; that really made my day." Pt remained respectful, calm and cooperative. A: Medications offered as prescribed.  Support, encouragement, and safe environment provided.  15-minute safety checks continue. R: Pt was med compliant.  Pt attended group. Safety checks continue.

## 2015-08-22 NOTE — Progress Notes (Signed)
DAR NOTE: Patient presents with anxious affect and depressed mood.  Denies SI/HI, auditory and visual hallucinations.  Rates depression at 0, hopelessness at 0, and anxiety at 0.  Maintained on routine safety checks.  Medications given as prescribed.  Support and encouragement offered as needed.  Attended group and participated.  States goal for today is "learn to use coping skills."  Patient observed socializing with peers in the dayroom. Vistaril 25 mg given for complain of anxiety with good effect.

## 2015-08-23 NOTE — B[facility] Group Notes (Signed)
[facility] Group Notes: (Clinical Social Work)   08/23/2015      Type of Therapy:  Group Therapy   Participation Level:  Did Not Attend - did not feel well - did arrive for the last 10 minutes and stated she did not feel sleep anymore at the end of music group   Ambrose MantleMareida Grossman-Orr, LCSW 08/23/2015, 11:51 AM

## 2015-08-23 NOTE — Progress Notes (Signed)
[facility] MD Progress Note  08/23/2015 3:04 PM [name]  [facility]  [id number] Subjective:  Patient reports " I am ready to be discharged, I feel fine you can call my group home"  Objective : Holly [name] is awake, alert and oriented X3 , found attending group session.  Denies suicidal or homicidal ideations. States that I have worked through all my issues. and I am ready to leave.Denies auditory or visual hallucination and does not appear to be responding to internal stimuli. Patient reports interacting well with staff and others. Patient reports she is medication compliant without mediation side effects. Report learning new coping skills to color, cross word puzzles and journal. States her depression 2/10. Patient reports good appetite and states she is resting well.  Support, encouragement and reassurance was provided.   Principal Problem: Schizoaffective disorder, bipolar type (HCC) Diagnosis:   Patient Active Problem List   Diagnosis Date Noted  . Hallucinations [R44.3]   . Suicidal ideations [R45.851]   . Injury, self-inflicted [F48.9]   . Schizoaffective disorder, bipolar type (HCC) [F25.0]   . 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 Psychosis [F29] 03/18/2014  . Bipolar disorder, curr episode mixed, severe, with psychotic features (HCC) [F31.64] 12/14/2013  . ADHD (attention deficit hyperactivity disorder) [F90.9] 01/03/2013  . PTSD (post-traumatic stress disorder) [F43.10] 01/03/2013  . Mental retardation [F79] 01/03/2013  . Agitation [R45.1] 01/03/2013   Total Time spent with patient: 20 minutes    Past Medical History:  Past Medical History  Diagnosis Date  . Bipolar 1 disorder (HCC)   . Gluten free diet   . ADHD (attention deficit hyperactivity disorder)   . PTSD (post-traumatic stress disorder)   . 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 Mood disorder (HCC)   . Diabetes mellitus   . Obesity   . Depression   . Hypothyroidism     Past Surgical History  Procedure  Laterality Date  . None    . No past surgeries     Family History: History reviewed. No pertinent family history.  Social History:  History  Alcohol Use No     History  Drug Use No    Social History   Social History  . Marital Status: Single    Spouse Name: N/A  . Number of Children: N/A  . Years of Education: N/A   Social History Main Topics  . Smoking status: Never Smoker   . Smokeless tobacco: Never Used  . Alcohol Use: No  . Drug Use: No  . Sexual Activity: No   Other Topics Concern  . None   Social History Narrative   Additional Social History:   Sleep: Good  Appetite:  Good  Current Medications: Current Facility-Administered Medications  Medication Dose Route Frequency Provider Last Rate Last Dose  . acetaminophen (TYLENOL) tablet 650 mg  650 mg Oral Q6H PRN Earney NavyJosephine C Onuoha, NP   650 mg at 08/21/15 0933  . alum & mag hydroxide-simeth (MAALOX/MYLANTA) 200-200-20 MG/5ML suspension 30 mL  30 mL Oral Q4H PRN Earney NavyJosephine C Onuoha, NP   30 mL at 08/21/15 2245  . FLUoxetine (PROZAC) capsule 20 mg  20 mg Oral Daily Earney NavyJosephine C Onuoha, NP   20 mg at 08/23/15 0819  . fluticasone (FLONASE) 50 MCG/ACT nasal spray 1 spray  1 spray Each Nare Daily PRN Earney NavyJosephine C Onuoha, NP      . hydrOXYzine (ATARAX/VISTARIL) tablet 25 mg  25 mg Oral TID PRN Oneta Rackanika N Lewis, NP  25 mg at 08/22/15 2338  . levothyroxine (SYNTHROID, LEVOTHROID) tablet 100 mcg  100 mcg Oral QAC breakfast Earney Navy, NP   100 mcg at 08/23/15 0608  . lithium carbonate (ESKALITH) CR tablet 450 mg  450 mg Oral BID PC Earney Navy, NP   450 mg at 08/23/15 0819  . magnesium hydroxide (MILK OF MAGNESIA) suspension 30 mL  30 mL Oral Daily PRN Earney Navy, NP   30 mL at 08/21/15 1644  . metFORMIN (GLUCOPHAGE) tablet 1,000 mg  1,000 mg Oral Q supper Earney Navy, NP   1,000 mg at 08/22/15 1715  . metFORMIN (GLUCOPHAGE) tablet 500 mg  500 mg Oral Q breakfast Earney Navy, NP   500 mg at  08/23/15 0819  . OLANZapine (ZYPREXA) tablet 10 mg  10 mg Oral QHS Craige Cotta, MD   10 mg at 08/22/15 2138  . OLANZapine (ZYPREXA) tablet 5 mg  5 mg Oral q morning - 10a Craige Cotta, MD   5 mg at 08/23/15 1006    Lab Results:  No results found for this or any previous visit (from the past 48 hour(s)).  Blood Alcohol level:  Lab Results  Component Value Date   ETH <5 08/18/2015   ETH <5 02/19/2015    Physical Findings: AIMS: Facial and Oral Movements Muscles of Facial Expression: None, normal Lips and Perioral Area: None, normal Jaw: None, normal Tongue: None, normal,Extremity Movements Upper (arms, wrists, hands, fingers): None, normal Lower (legs, knees, ankles, toes): None, normal, Trunk Movements Neck, shoulders, hips: None, normal, Overall Severity Severity of abnormal movements (highest score from questions above): None, normal Incapacitation due to abnormal movements: None, normal Patient's awareness of abnormal movements (rate only patient's report): No Awareness, Dental Status Current problems with teeth and/or dentures?: No Does patient usually wear dentures?: No  CIWA:    COWS:     Musculoskeletal: Strength & Muscle Tone: within normal limits Gait & Station: normal Patient leans: N/A  Psychiatric Specialty Exam: Review of Systems  Gastrointestinal: Positive for nausea.  Psychiatric/Behavioral: Negative for depression and suicidal ideas. The patient is nervous/anxious.   All other systems reviewed and are negative.  denies headache, denies chest pain, denies shortness of breath, no vomiting   Blood pressure 120/75, pulse 119, temperature 98.1 F (36.7 C), temperature source Oral, resp. rate 20, height  (1.6 m), weight 99.565 kg (219 lb 8 oz), SpO2 99 %.Body mass index is 38.89 kg/(m^2).  General Appearance: Fairly Groomed  Patent attorney::  Good  Speech:  Normal Rate  Volume:  Normal  Mood:  Anxious and states feeling better today, still  minimizes depression at this time  Affect:  Appropriate and Congruent  Thought Process:  generally goal directed   Orientation:  Other:  fully alert and attentive   Thought Content:  denies hallucinations, no delusions, not internally preoccupied   Suicidal Thoughts:  No at this time.  denies any active suicidal ideations, denies any self injurious ideations, and denies any violent or homicidal ideations  -continues to denies any ongoing homicidal ideations towards  Day program staff member- see admit note   Homicidal Thoughts:  No  Memory:  recent and remote grossly intact   Judgement:  Improving   Insight:  Improving   Psychomotor Activity:  Normal  Concentration:  Good  Recall:  Good  Fund of Knowledge:Good  Language: Good  Akathisia:  Negative  Handed:  Right  AIMS (if indicated):  Assets:  Desire for Improvement Resilience  ADL's:  Intact  Cognition: WNL  Sleep:  Number of Hours: 5     I agree with current treatment plan on 08/23/2015, Patient seen face-to-face for psychiatric evaluation follow-up, chart reviewed. Reviewed the information documented and agree with the treatment plan.  Treatment Plan Summary: Daily contact with patient to assess and evaluate symptoms and progress in treatment, Medication management, Plan inpatient treatment and medications as below   Continue to encourage group and milieu participation to work on coping skills and symptom reduction Continue Zofran  PO PRN for nausea  Continue Prozac 20 mg PO QD for mood stabilization Continue  Lithium 450 mgrs BID for management of bipolar disorder li level (0.28) Continue Zyprexa 5 mgrs QAM and 10 mgrs QHS for management of mood disorder   Treatment team working on disposition options   Oneta Rack, NP 08/23/2015, 3:04 PM Agree with NP note as above  Nehemiah Massed, MD

## 2015-08-24 DIAGNOSIS — F4310 Post-traumatic stress disorder, unspecified: Secondary | ICD-10-CM

## 2015-08-24 DIAGNOSIS — F70 Mild intellectual disabilities: Secondary | ICD-10-CM

## 2015-08-24 MED ORDER — OLANZAPINE 5 MG PO TABS: ORAL_TABLET | ORAL | Status: DC

## 2015-08-24 MED ORDER — LITHIUM CARBONATE ER 450 MG PO TBCR: 450.0000 mg | EXTENDED_RELEASE_TABLET | Freq: Two times a day (BID) | ORAL | Status: DC

## 2015-08-24 MED ORDER — METFORMIN HCL 500 MG PO TABS: ORAL_TABLET | ORAL | Status: DC

## 2015-08-24 MED ORDER — HYDROXYZINE HCL 25 MG PO TABS: 25.0000 mg | ORAL_TABLET | Freq: Three times a day (TID) | ORAL | Status: DC | PRN

## 2015-08-24 MED ORDER — FLUOXETINE HCL 20 MG PO CAPS: 20.0000 mg | ORAL_CAPSULE | Freq: Every day | ORAL | Status: DC

## 2015-08-24 MED ORDER — OLANZAPINE 5 MG PO TABS: 5.0000 mg | ORAL_TABLET | Freq: Every morning | ORAL | Status: DC

## 2015-08-24 MED ORDER — LEVOTHYROXINE SODIUM 100 MCG PO TABS: 100.0000 ug | ORAL_TABLET | Freq: Every day | ORAL | Status: DC

## 2015-08-24 MED ORDER — OLANZAPINE 10 MG PO TABS: 10.0000 mg | ORAL_TABLET | Freq: Every day | ORAL | Status: DC

## 2015-08-24 MED ORDER — FLUTICASONE PROPIONATE 50 MCG/ACT NA SUSP: 1.0000 | Freq: Every day | NASAL | Status: DC | PRN

## 2015-08-24 NOTE — [facility] Suicide Risk Assessment (Signed)
[facility]   Principal Problem: Schizoaffective disorder, bipolar type [facility](HCC) Discharge Diagnoses:  Patient Active Problem List   Diagnosis Date Noted  . Hallucinations [R44.3]   . Suicidal ideations [R45.851]   . Injury, self-inflicted [F48.9]   . Schizoaffective disorder, bipolar type (HCC) [F25.0]   . 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 Psychosis [F29] 03/18/2014  . ADHD (attention deficit hyperactivity disorder) [F90.9] 01/03/2013  . PTSD (post-traumatic stress disorder) [F43.10] 01/03/2013  . Agitation [R45.1] 01/03/2013    Total Time spent with patient: 30 minutes  Musculoskeletal: Strength & Muscle Tone: within normal limits Gait & Station: normal Patient leans: N/A  Psychiatric Specialty Exam: Review of Systems  Psychiatric/Behavioral: Negative for depression, suicidal ideas and hallucinations.  All other systems reviewed and are negative.   Blood pressure 116/79, pulse 121, temperature 99 F (37.2 C), temperature source Oral, resp. rate 16, height 5\' 3"  (1.6 m), weight 99.565 kg (219 lb 8 oz), SpO2 99 %.Body mass index is 38.89 kg/(m^2).  General Appearance: Casual  Eye Contact::  Fair  Speech:  Clear and Coherent409  Volume:  Normal  Mood:  Euthymic  Affect:  Congruent  Thought Process:  Goal Directed  Orientation:  Full (Time, Place, and Person)  Thought Content:  WDL  Suicidal Thoughts:  No  Homicidal Thoughts:  No  Memory:  Immediate;   Fair Recent;   Fair Remote;   Fair  Judgement:  Fair  Insight:  Fair  Psychomotor Activity:  Normal  Concentration:  Fair  Recall:  FiservFair  Fund of Knowledge:Good  Language: Fair  Akathisia:  No  Handed:  Right  AIMS (if indicated):   0  Assets:  Desire for Improvement  Sleep:  Number of Hours: 5  Cognition: WNL  ADL's:  Intact   Mental Status Per Nursing Assessment::   On Admission:  Self-harm thoughts, Suicide plan  Demographic Factors:  Caucasian  Loss  Factors: NA  Historical Factors: Impulsivity  Risk Reduction Factors:   Positive social support  Continued Clinical Symptoms:  More than one psychiatric diagnosis Previous Psychiatric Diagnoses and Treatments  Cognitive Features That Contribute To Risk:  Polarized thinking    Suicide Risk:  Minimal: No identifiable suicidal ideation.  Patients presenting with no risk factors but with morbid ruminations; may be classified as minimal risk based on the severity of the depressive symptoms  Follow-up Information    Follow up with [facility].   Specialty:  Behavioral Health   Why:  See Tamela OddiJo Hughes at your next scheduled appointment.   Contact information:   [address] [place] [address] [phone]       Plan Of Care/Follow-up recommendations:  Activity:  no restrictions Diet:  regular Tests:  Li level on 08/25/15 Other:  none  Samhita Kretsch, MD 08/24/2015, 9:30 AM

## 2015-08-24 NOTE — Progress Notes (Signed)
D   Pt is intrusive and very talkative   She is pleasant on approach and compliant with her treatment   She interacts well with others A   Verbal support given   Medications administered and effectiveness monitored   Q 15 min checks R    Pt safe at present

## 2015-08-24 NOTE — Progress Notes (Signed)
Pt discharged home with Ms. Amison.  Pt was stable and appreciative at time of discharge.  All discharge paperwork, prescriptions were given and valuables returned.  Verbal understanding expressed, pt given opportunity to express concerns and ask questions.  Denies SI and A/V hallucinations. Denies pain.  Escorted to lobby and spoke with escort.  Gave discharge information and prescriptions to caregiver per pt's request.

## 2015-08-24 NOTE — Discharge Summary (Signed)
Physician Discharge Summary Note  Patient:  Holly [name] is an 20 y.o., female MRN:  [id number] DOB:  Apr 18, 1996 Patient phone:  [phone] (home)  Patient address:   [address] [place] [address],  Total Time spent with patient: 30 minutes  Date of Admission:  08/19/2015  Date of Discharge: 08-24-15  Reason for Admission: Worsening symptoms of Schizoaffective disorder  Principal Problem: Schizoaffective disorder, bipolar type [facility]) Discharge Diagnoses: Patient Active Problem List   Diagnosis Date Noted  . Hallucinations [R44.3]   . Suicidal ideations [R45.851]   . Injury, self-inflicted [F48.9]   . Schizoaffective disorder, bipolar type (HCC) [F25.0]   . 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 Psychosis [F29] 03/18/2014  . ADHD (attention deficit hyperactivity disorder) [F90.9] 01/03/2013  . PTSD (post-traumatic stress disorder) [F43.10] 01/03/2013  . Agitation [R45.1] 01/03/2013   Musculoskeletal: Strength & Muscle Tone: within normal limits Gait & Station: normal Patient leans: N/A  Psychiatric Specialty Exam:  SEE MD SRA Physical Exam  Vitals reviewed. Constitutional: She appears well-developed and well-nourished.  HENT:  Head: Normocephalic.  Eyes: Pupils are equal, round, and reactive to light.  Neck: Normal range of motion.  Cardiovascular: Normal rate and regular rhythm.   Respiratory: Effort normal and breath sounds normal.  GI: Soft.  Genitourinary:  Denies any issues in this area  Musculoskeletal: Normal range of motion.  Neurological: She is alert.  Skin: Skin is warm and dry.  Psychiatric: Her mood appears not anxious. She is not agitated and not aggressive. Thought content is not paranoid and not delusional. She exhibits a depressed mood. She expresses no homicidal and no suicidal ideation. She expresses no suicidal plans and no homicidal plans.    Review of Systems  Constitutional: Negative.   HENT: Negative.   Eyes: Negative.    Respiratory: Negative.   Cardiovascular: Negative.   Gastrointestinal: Negative.   Genitourinary: Negative.   Musculoskeletal: Negative.   Skin: Negative.   Neurological: Negative.   Endo/Heme/Allergies: Negative.   Psychiatric/Behavioral: Positive for depression (Stable) and hallucinations (Hx. off). Negative for suicidal ideas, memory loss and substance abuse. The patient has insomnia (Stable). The patient is not nervous/anxious.   All other systems reviewed and are negative.   Blood pressure 116/79, pulse 121, temperature 99 F (37.2 C), temperature source Oral, resp. rate 16, height 5\' 3"  (1.6 m), weight 99.565 kg (219 lb 8 oz), SpO2 99 %.Body mass index is 38.89 kg/(m^2).  Have you used any form of tobacco in the last 30 days? (Cigarettes, Smokeless Tobacco, Cigars, and/or Pipes): No  Has this patient used any form of tobacco in the last 30 days? (Cigarettes, Smokeless Tobacco, Cigars, and/or Pipes) N/A  Past Medical History:  Past Medical History  Diagnosis Date  . Bipolar 1 disorder (HCC)   . Gluten free diet   . ADHD (attention deficit hyperactivity disorder)   . PTSD (post-traumatic stress disorder)   . 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 Mood disorder (HCC)   . Diabetes mellitus   . Obesity   . Depression   . Hypothyroidism     Past Surgical History  Procedure Laterality Date  . None    . No past surgeries     Family History: History reviewed. No pertinent family history. Social History:  History  Alcohol Use No     History  Drug Use No    Social History   Social History  . Marital Status: Single    Spouse  Name: N/A  . Number of Children: N/A  . Years of Education: N/A   Social History Main Topics  . Smoking status: Never Smoker   . Smokeless tobacco: Never Used  . Alcohol Use: No  . Drug Use: No  . Sexual Activity: No   Other Topics Concern  . None   Social History Narrative   Past Psychiatric History: Schizoaffective disorder, PTSD  Risk  to Self: Is patient at risk for suicide?: Yes Risk to Others: No Prior Inpatient Therapy: Yes Prior Outpatient Therapy: Yes  Level of Care:  OP  Hospital Course:  Holly [name] is a 20 year old Caucasian female. Patient has been hospitalized in this hospital x numerous times. [facility] with complaints of suicidal & homicidal ideations towards one of her day program staff. Today, during this assessment, Holly [name] reports, "The cops took me to the ED yesterday because the staff at my day program refused to take me to the hospital. I was hearing voices & see things. It has been going on x 3 weeks. The voices are telling me to kill them & make them pay. There is a staff member at my day program that does things that irritate me. She will tell me that I was not hearing voices or seeing things. She told everyone not to believe me. She said I was lying to them. The voices come & go in the past, only this time, it has lasted for 3 weeks. I have depression, ADHD & Schizophrenia since my childhood. I was also thinking about hurting myself, but, I did not attempt it. I had attempted suicide in the past. I cut my arm & was hospitalized. I take lithium, Prozac etc".  [facility]olly [name] for complaints of auditory hallucinations, suicidal & homicidal ideations. She reported on admission that these symptoms has persisted for 3 weeks. She reported the trigger as not being believed by the staff at her day program that she was indeed hearing voices. She stated that the symptoms has this time lasted longer than it used to be. Holly [name] required mood stabilization treatment. After her admission assessment, she was medicated & discharged on; Olanzapine 5 mg & 10 mg respectively for mood control, Lithium carbonate 450 mg for mood stabilization, Fluoxetine 20 mg for depression & Hydroxyzine 25 mg prn for anxiety. Her other pre-existing medical problems were  identified and treated by resuming her on her pertinent home medications for those health issues. She tolerated his treatment regimen without any adverse effects or reactions reported.  During the course of her hospitalization, Jerrylynn's progress was monitored by observation & her daily report of symptom reduction. Her emotional & mental status were assessed & monitored by daily self-inventory reports completed by her & the clinical staff. She was also evaluated daily by the treatment team for mood stability and plans for continued recovery after discharge. Holly [name]'s motivation was an integral factor in her mood stability. She was offered further treatment options upon discharge on outpatient basis as listed below. She was provided with all the necessary information needed to make this appointment without problems. Upon discharge, she was both mentally & medically stable. She is adamntly denying suicidal/homicidal ideation, auditory/visual/tactile hallucinations, delusional thoughts & or paranoia. [facility] in no apparent distress. Transportation per Ms. Amison.  Consults:  psychiatry  Significant Diagnostic Studies:  labs: per ED  Discharge Vitals:   Blood pressure 116/79, pulse  121, temperature 99 F (37.2 C), temperature source Oral, resp. rate 16, height  (1.6 m), weight 99.565 kg (219 lb 8 oz), SpO2 99 %. Body mass index is 38.89 kg/(m^2). Lab Results:   No results found for this or any previous visit (from the past 72 hour(s)).  Physical Findings: AIMS: Facial and Oral Movements Muscles of Facial Expression: None, normal Lips and Perioral Area: None, normal Jaw: None, normal Tongue: None, normal,Extremity Movements Upper (arms, wrists, hands, fingers): None, normal Lower (legs, knees, ankles, toes): None, normal, Trunk Movements Neck, shoulders, hips: None, normal, Overall Severity Severity of abnormal movements (highest score from questions  above): None, normal Incapacitation due to abnormal movements: None, normal Patient's awareness of abnormal movements (rate only patient's report): No Awareness, Dental Status Current problems with teeth and/or dentures?: No Does patient usually wear dentures?: No  CIWA:    COWS:     See Psychiatric Specialty Exam and Suicide Risk Assessment completed by Attending Physician prior to discharge.  Discharge destination:  Home  Is patient on multiple antipsychotic therapies at discharge:  No   Has Patient had three or more failed trials of antipsychotic monotherapy by history:  No  Recommended Plan for Multiple Antipsychotic Therapies: NA     Discharge Instructions    Discharge instructions    Complete by:  As directed   Obtain Lithium levels tomorrow 08-25-15            Medication List    STOP taking these medications        acidophilus Caps capsule     ALIGN 4 MG Caps     BENZACLIN WITH PUMP gel  Generic drug:  clindamycin-benzoyl peroxide     benztropine 1 MG tablet  Commonly known as:  COGENTIN     doxepin 50 MG capsule  Commonly known as:  SINEQUAN     lactase 3000 units tablet  Commonly known as:  LACTAID     medroxyPROGESTERone 150 MG/ML injection  Commonly known as:  DEPO-PROVERA     methylphenidate 18 MG CR tablet  Commonly known as:  CONCERTA     omeprazole 20 MG capsule  Commonly known as:  PRILOSEC     polycarbophil 625 MG tablet  Commonly known as:  FIBERCON      TAKE these medications      Indication   FLUoxetine 20 MG capsule  Commonly known as:  PROZAC  Take 1 capsule (20 mg total) by mouth daily. For depression   Indication:  Major Depressive Disorder     fluticasone 50 MCG/ACT nasal spray  Commonly known as:  FLONASE  Place 1 spray into both nostrils daily as needed for allergies.   Indication:  Hayfever     hydrOXYzine 25 MG tablet  Commonly known as:  ATARAX/VISTARIL  Take 1 tablet (25 mg total) by mouth 3 (three) times daily  as needed (Anxiety).   Indication:  Anxiety Neurosis     levothyroxine 100 MCG tablet  Commonly known as:  SYNTHROID, LEVOTHROID  Take 1 tablet (100 mcg total) by mouth daily before breakfast. For low thyroid function   Indication:  Underactive Thyroid     lithium carbonate 450 MG CR tablet  Commonly known as:  ESKALITH  Take 1 tablet (450 mg total) by mouth 2 (two) times daily after a meal. For mood stabilization   Indication:  Manic-Depression, Schizoaffective Disorder, Mood Stabilization     metFORMIN 500 MG tablet  Commonly known as:  GLUCOPHAGE  Take  1 tablet (500 mg) at breakfast & 2 tablets (1000 mg) at supper: For diabetes   Indication:  Type 2 Diabetes     OLANZapine 5 MG tablet  Commonly known as:  ZYPREXA  Take 1 tablet (5 mg in the morning & 2 tablets (10 mg at bedtime: For mood control   Indication:  Mood control       Follow-up Information    Follow up with [facility].   Specialty:  Behavioral Health   Why:  See Tamela Oddi at your next scheduled appointment.   Contact information:   [address] [place] [address] [phone]      Follow-up recommendations:  Activity:  As tolerated Diet: As recommended by your primary care doctor. Keep all scheduled follow-up appointments as recommended.  Comments: Take all your medications as prescribed by your mental healthcare provider. Report any adverse effects and or reactions from your medicines to your outpatient provider promptly. Patient is instructed and cautioned to not engage in alcohol and or illegal drug use while on prescription medicines. In the event of worsening symptoms, patient is instructed to call the crisis hotline, 911 and or go to the nearest ED for appropriate evaluation and treatment of symptoms. Follow-up with your primary care provider for your other medical issues, concerns and or health care needs.  SignedArmandina Stammer I PMHNP-BC 08/24/2015, 12:57  PM
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
## 2015-08-24 NOTE — Progress Notes (Signed)
  [facility] Discharge Plan :  Will you be returning to the same living situation after discharge:  Yes,  home At discharge, do you have transportation home?: Yes,  Ms Amison Do you have the ability to pay for your medications: Yes,  MCD  Release of information consent forms completed and in the chart;  Patient's signature needed at discharge.  Patient to Follow up at: Follow-up Information    Follow up with [facility].   Specialty:  Behavioral Health   Why:  See Tamela OddiJo Hughes at your next scheduled appointment.   Contact information:   [address] [place] [address] [phone]       Next level of care provider has [facility] to [facility] Link:no  Safety Planning and Suicide Prevention discussed: Yes,  yes  Have you used any form of tobacco in the last 30 days? (Cigarettes, Smokeless Tobacco, Cigars, and/or Pipes): No  Has patient been referred to the Quitline?: N/A patient is not a smoker  Patient has been referred for addiction treatment: N/A  Ida Rogueorth, Muscab Brenneman B 08/24/2015, 10:54 AM

## 2015-12-17 ENCOUNTER — Inpatient Hospital Stay (HOSPITAL_COMMUNITY)
Admission: EM | Admit: 2015-12-17 | Discharge: 2015-12-22 | DRG: 394 | Disposition: A | Discharge status: Home / Self Care | Payer: Medicaid Other | Attending: Family Medicine | Admitting: Family Medicine

## 2015-12-17 ENCOUNTER — Encounter (HOSPITAL_COMMUNITY): Payer: Self-pay | Admitting: Emergency Medicine

## 2015-12-17 ENCOUNTER — Emergency Department (HOSPITAL_COMMUNITY): Payer: Medicaid Other

## 2015-12-17 DIAGNOSIS — T184XXA Foreign body in colon, initial encounter: Secondary | ICD-10-CM | POA: Diagnosis present

## 2015-12-17 DIAGNOSIS — F250 Schizoaffective disorder, bipolar type: Secondary | ICD-10-CM | POA: Diagnosis present

## 2015-12-17 DIAGNOSIS — E119 Type 2 diabetes mellitus without complications: Secondary | ICD-10-CM | POA: Diagnosis present

## 2015-12-17 DIAGNOSIS — Z91018 Allergy to other foods: Secondary | ICD-10-CM

## 2015-12-17 DIAGNOSIS — E739 Lactose intolerance, unspecified: Secondary | ICD-10-CM | POA: Diagnosis present

## 2015-12-17 DIAGNOSIS — D72829 Elevated white blood cell count, unspecified: Secondary | ICD-10-CM | POA: Diagnosis present

## 2015-12-17 DIAGNOSIS — E039 Hypothyroidism, unspecified: Secondary | ICD-10-CM | POA: Diagnosis not present

## 2015-12-17 DIAGNOSIS — Z915 Personal history of self-harm: Secondary | ICD-10-CM

## 2015-12-17 DIAGNOSIS — Z6841 Body Mass Index (BMI) 40.0 and over, adult: Secondary | ICD-10-CM

## 2015-12-17 DIAGNOSIS — F70 Mild intellectual disabilities: Secondary | ICD-10-CM | POA: Diagnosis present

## 2015-12-17 DIAGNOSIS — Z79899 Other long term (current) drug therapy: Secondary | ICD-10-CM

## 2015-12-17 DIAGNOSIS — T189XXA Foreign body of alimentary tract, part unspecified, initial encounter: Secondary | ICD-10-CM | POA: Diagnosis not present

## 2015-12-17 DIAGNOSIS — Z7984 Long term (current) use of oral hypoglycemic drugs: Secondary | ICD-10-CM

## 2015-12-17 DIAGNOSIS — T1491 Suicide attempt: Secondary | ICD-10-CM | POA: Diagnosis present

## 2015-12-17 DIAGNOSIS — F32A Depression, unspecified: Secondary | ICD-10-CM

## 2015-12-17 DIAGNOSIS — R45851 Suicidal ideations: Secondary | ICD-10-CM | POA: Diagnosis not present

## 2015-12-17 DIAGNOSIS — T182XXA Foreign body in stomach, initial encounter: Principal | ICD-10-CM | POA: Diagnosis present

## 2015-12-17 DIAGNOSIS — F909 Attention-deficit hyperactivity disorder, unspecified type: Secondary | ICD-10-CM | POA: Diagnosis present

## 2015-12-17 DIAGNOSIS — F329 Major depressive disorder, single episode, unspecified: Secondary | ICD-10-CM | POA: Diagnosis not present

## 2015-12-17 DIAGNOSIS — X838XXA Intentional self-harm by other specified means, initial encounter: Secondary | ICD-10-CM

## 2015-12-17 DIAGNOSIS — F4310 Post-traumatic stress disorder, unspecified: Secondary | ICD-10-CM | POA: Diagnosis present

## 2015-12-17 LAB — I-STAT BETA HCG BLOOD, ED (MC, WL, AP ONLY): I-stat hCG, quantitative: <5 m[IU]/mL (ref <5)

## 2015-12-17 LAB — RAPID URINE DRUG SCREEN, HOSP PERFORMED
Amphetamines: NOT DETECTED
Barbiturates: NOT DETECTED
Benzodiazepines: NOT DETECTED
Cocaine: NOT DETECTED
Opiates: NOT DETECTED
Tetrahydrocannabinol: NOT DETECTED

## 2015-12-17 LAB — CBC
HCT: 39.8 % (ref 36.0–46.0)
Hemoglobin: 13 g/dL (ref 12.0–15.0)
MCH: 31.2 pg (ref 26.0–34.0)
MCHC: 32.7 g/dL (ref 30.0–36.0)
MCV: 95.4 fL (ref 78.0–100.0)
Platelets: 333 10*3/uL (ref 150–400)
RBC: 4.17 MIL/uL (ref 3.87–5.11)
RDW: 13 % (ref 11.5–15.5)
WBC: 5.8 10*3/uL (ref 4.0–10.5)

## 2015-12-17 LAB — COMPREHENSIVE METABOLIC PANEL
ALT: 10 U/L — ABNORMAL LOW (ref 14–54)
AST: 21 U/L (ref 15–41)
Albumin: 4 g/dL (ref 3.5–5.0)
Alkaline Phosphatase: 137 U/L — ABNORMAL HIGH (ref 38–126)
Anion gap: 7 (ref 5–15)
BUN: 8 mg/dL (ref 6–20)
CO2: 24 mmol/L (ref 22–32)
Calcium: 9.7 mg/dL (ref 8.9–10.3)
Chloride: 110 mmol/L (ref 101–111)
Creatinine, Ser: 0.63 mg/dL (ref 0.44–1.00)
GFR calc Af Amer: >60 mL/min (ref >60)
GFR calc non Af Amer: >60 mL/min (ref >60)
Glucose, Bld: 100 mg/dL — ABNORMAL HIGH (ref 65–99)
Potassium: 3.7 mmol/L (ref 3.5–5.1)
Sodium: 141 mmol/L (ref 135–145)
Total Bilirubin: 0.3 mg/dL (ref 0.3–1.2)
Total Protein: 7.6 g/dL (ref 6.5–8.1)

## 2015-12-17 LAB — CBG MONITORING, ED: Glucose-Capillary: 137 mg/dL — ABNORMAL HIGH (ref 65–99)

## 2015-12-17 LAB — GLUCOSE, CAPILLARY
Glucose-Capillary: 123 mg/dL — ABNORMAL HIGH (ref 65–99)
Glucose-Capillary: 110 mg/dL — ABNORMAL HIGH (ref 65–99)

## 2015-12-17 LAB — SALICYLATE LEVEL: Salicylate Lvl: <4 mg/dL (ref 2.8–30.0)

## 2015-12-17 LAB — ACETAMINOPHEN LEVEL: Acetaminophen (Tylenol), Serum: <10 ug/mL — ABNORMAL LOW (ref 10–30)

## 2015-12-17 LAB — ETHANOL: Alcohol, Ethyl (B): <5 mg/dL (ref <5)

## 2015-12-17 MED ORDER — LITHIUM CARBONATE ER 450 MG PO TBCR
450.0000 mg | EXTENDED_RELEASE_TABLET | Freq: Every day | ORAL | Status: DC
  Administered 2015-12-18 – 2015-12-22 (×5): 450 mg via ORAL
  Filled 2015-12-17 (×5): qty 1

## 2015-12-17 MED ORDER — BENZTROPINE MESYLATE 1 MG PO TABS
1.0000 mg | ORAL_TABLET | Freq: Every day | ORAL | Status: DC
  Administered 2015-12-17 – 2015-12-19 (×3): 1 mg via ORAL
  Filled 2015-12-17 (×3): qty 1

## 2015-12-17 MED ORDER — FLUTICASONE PROPIONATE 50 MCG/ACT NA SUSP: 1.0000 | Freq: Every day | NASAL | Status: DC | PRN

## 2015-12-17 MED ORDER — INSULIN ASPART 100 UNIT/ML ~~LOC~~ SOLN
0.0000 [IU] | Freq: Three times a day (TID) | SUBCUTANEOUS | Status: DC
  Administered 2015-12-18 – 2015-12-21 (×5): 1 [IU] via SUBCUTANEOUS

## 2015-12-17 MED ORDER — HYDROCODONE-ACETAMINOPHEN 5-325 MG PO TABS
1.0000 | ORAL_TABLET | Freq: Once | ORAL | Status: AC
  Administered 2015-12-17: 1 via ORAL
  Filled 2015-12-17: qty 1

## 2015-12-17 MED ORDER — ACETAMINOPHEN 325 MG PO TABS
650.0000 mg | ORAL_TABLET | Freq: Four times a day (QID) | ORAL | Status: DC | PRN
  Administered 2015-12-17 – 2015-12-19 (×2): 650 mg via ORAL
  Filled 2015-12-17 (×2): qty 2

## 2015-12-17 MED ORDER — ALUM & MAG HYDROXIDE-SIMETH 200-200-20 MG/5ML PO SUSP
30.0000 mL | Freq: Four times a day (QID) | ORAL | Status: DC | PRN
  Administered 2015-12-17 – 2015-12-19 (×2): 30 mL via ORAL
  Filled 2015-12-17 (×2): qty 30

## 2015-12-17 MED ORDER — LITHIUM CARBONATE ER 450 MG PO TBCR
900.0000 mg | EXTENDED_RELEASE_TABLET | Freq: Every day | ORAL | Status: DC
  Administered 2015-12-17 – 2015-12-21 (×5): 900 mg via ORAL
  Filled 2015-12-17 (×6): qty 2

## 2015-12-17 MED ORDER — ENOXAPARIN SODIUM 40 MG/0.4ML ~~LOC~~ SOLN
40.0000 mg | SUBCUTANEOUS | Status: DC
  Administered 2015-12-17 – 2015-12-21 (×5): 40 mg via SUBCUTANEOUS
  Filled 2015-12-17 (×5): qty 0.4

## 2015-12-17 MED ORDER — LEVOTHYROXINE SODIUM 100 MCG PO TABS
100.0000 ug | ORAL_TABLET | Freq: Every day | ORAL | Status: DC
  Administered 2015-12-18 – 2015-12-22 (×5): 100 ug via ORAL
  Filled 2015-12-17 (×5): qty 1

## 2015-12-17 MED ORDER — FLUOXETINE HCL 20 MG PO CAPS
20.0000 mg | ORAL_CAPSULE | Freq: Every day | ORAL | Status: DC
Start: 2015-12-18 — End: 2015-12-22
  Administered 2015-12-18 – 2015-12-22 (×5): 20 mg via ORAL
  Filled 2015-12-17 (×5): qty 1

## 2015-12-17 MED ORDER — OLANZAPINE 5 MG PO TABS
10.0000 mg | ORAL_TABLET | Freq: Every day | ORAL | Status: DC
  Administered 2015-12-17 – 2015-12-21 (×5): 10 mg via ORAL
  Filled 2015-12-17 (×6): qty 2

## 2015-12-17 MED ORDER — DOXEPIN HCL 50 MG PO CAPS
50.0000 mg | ORAL_CAPSULE | Freq: Every day | ORAL | Status: DC
  Administered 2015-12-17 – 2015-12-19 (×3): 50 mg via ORAL
  Filled 2015-12-17 (×3): qty 1

## 2015-12-17 MED ORDER — AMANTADINE HCL 100 MG PO CAPS
100.0000 mg | ORAL_CAPSULE | Freq: Three times a day (TID) | ORAL | Status: DC
  Administered 2015-12-17 – 2015-12-22 (×16): 100 mg via ORAL
  Filled 2015-12-17 (×18): qty 1

## 2015-12-17 MED ORDER — OLANZAPINE 5 MG PO TABS
5.0000 mg | ORAL_TABLET | ORAL | Status: DC
  Administered 2015-12-18 – 2015-12-22 (×10): 5 mg via ORAL
  Filled 2015-12-17 (×12): qty 1

## 2015-12-17 NOTE — Progress Notes (Signed)
Md notified of Pt's location. Will continue to monitor.

## 2015-12-17 NOTE — ED Notes (Signed)
Patient, patient belongings and visitors wanded by security.
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
## 2015-12-17 NOTE — ED Notes (Signed)
Report given to Ester, RN

## 2015-12-17 NOTE — ED Notes (Signed)
Info PT of urine sample. [employer] and visitors are at bedaside
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
## 2015-12-17 NOTE — ED Notes (Addendum)
[name] Phone number [phone].  Patient guardian is Georga KaufmannMichelle Jukatz [phone] at [employer].

## 2015-12-17 NOTE — Progress Notes (Signed)
Received report from [employer]ia RN. Pt arrived unit, alert and oriented, able to communicate needs. Will continue with current plan of care.

## 2015-12-17 NOTE — ED Notes (Signed)
Bed: RESA Expected date:  Expected time:  Means of arrival:  Comments: Swallowed batteries

## 2015-12-17 NOTE — H&P (Signed)
History and Physical    Holly [name] ERD:[id number] DOB: [birth year] DOA: 12/17/2015  PCP: Elyn Peers, MD Patient coming from: Home  Chief Complaint: Foreign body ingestion  HPI: Holly [name] is a 20 y.o. female with medical history significant of suicidal attempt, depression, ?DM2. Patient presented to the [facility] ED for evaluation after ingesting a total of 8 AA batteries (4 yesterday and 2 today) as a suicide gesture. She reports having these thoughts because of a verbal altercation she recently had. She also reports having tied a shoestring around her neck yesterday, which was eventually removed. She reports no associated nausea, vomiting, abdominal pain , diarrhea or constipation.   ED Course: Poison control was called and recommended removal within 24 hours. GI consulted and felt first set of batteries were far along that they would pass spontaneously. Second set of 2 will likely follow. Alk phos mildly elevated.  Review of Systems: As per HPI otherwise 10 point review of systems negative.    Past Medical History:  Diagnosis Date  . ADHD (attention deficit hyperactivity disorder)   . Bipolar 1 disorder ([place])   . Depression   . Diabetes mellitus   . Gluten free diet   . Hypothyroidism   . Mood disorder ([place])   . 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  . Obesity   . PTSD (post-traumatic stress disorder)     Past Surgical History:  Procedure Laterality Date  . NO PAST SURGERIES    . None       reports that she has never smoked. She has never used smokeless tobacco. She reports that she does not drink alcohol or use drugs.  Allergies  Allergen Reactions  . Gluten Meal Other (See Comments)    Upset stomach - patient denies  . Lactose Intolerance (Gi) Nausea Only and Other (See Comments)    Upset stomach    Family History  Problem Relation Age of Onset  . Adopted: Yes    Prior to Admission medications   Medication Sig Start Date End Date Taking?  Authorizing Provider  amantadine (SYMMETREL) 100 MG capsule Take 100 mg by mouth 3 (three) times daily. Takes 1 in the morning, 1 at noon, and 1 at 1700   Yes Historical Provider, MD  benztropine (COGENTIN) 1 MG tablet Take 1 mg by mouth at bedtime.   Yes Historical Provider, MD  bifidobacterium infantis (ALIGN) capsule Take 1 capsule by mouth daily.   Yes Historical Provider, MD  doxepin (SINEQUAN) 50 MG capsule Take 50 mg by mouth at bedtime.   Yes Historical Provider, MD  FLUoxetine (PROZAC) 20 MG capsule Take 1 capsule (20 mg total) by mouth daily. For depression 08/24/15  Yes Encarnacion Slates, NP  fluticasone (FLONASE) 50 MCG/ACT nasal spray Place 1 spray into both nostrils daily as needed for allergies. 08/24/15  Yes Encarnacion Slates, NP  levothyroxine (SYNTHROID, LEVOTHROID) 100 MCG tablet Take 1 tablet (100 mcg total) by mouth daily before breakfast. For low thyroid function 08/24/15  Yes Encarnacion Slates, NP  linaclotide (LINZESS) 290 MCG CAPS capsule Take 290 mcg by mouth daily before breakfast.   Yes Historical Provider, MD  lithium carbonate (ESKALITH) 450 MG CR tablet Take 1 tablet (450 mg total) by mouth 2 (two) times daily after a meal. For mood stabilization Patient taking differently: Take 450-900 mg by mouth 2 (two) times daily after a meal. Takes 437m in the morning and 9049mat bedtime 08/24/15  Yes AgEncarnacion Slates  NP  medroxyPROGESTERone (DEPO-PROVERA) 150 MG/ML injection Inject 150 mg into the muscle every 3 (three) months.   Yes Historical Provider, MD  metFORMIN (GLUCOPHAGE) 500 MG tablet Take 1 tablet (500 mg) at breakfast & 2 tablets (1000 mg) at supper: For diabetes Patient taking differently: Take 500-1,000 mg by mouth 2 (two) times daily with a meal. Take 1 tablet (500 mg) at breakfast & 2 tablets (1000 mg) at supper: For diabetes 08/24/15  Yes Encarnacion Slates, NP  OLANZapine (ZYPREXA) 5 MG tablet Take 1 tablet (5 mg in the morning & 2 tablets (10 mg at bedtime: For mood control Patient  taking differently: Take 5-10 mg by mouth 3 (three) times daily. Takes 71m in the morning and at 1600, then 188mat bedtime 08/24/15  Yes AgEncarnacion SlatesNP  omeprazole (PRILOSEC) 20 MG capsule Take 20 mg by mouth daily with breakfast.    Yes Historical Provider, MD  polycarbophil (FIBERCON) 625 MG tablet Take 625 mg by mouth daily with breakfast.   Yes Historical Provider, MD  hydrOXYzine (ATARAX/VISTARIL) 25 MG tablet Take 1 tablet (25 mg total) by mouth 3 (three) times daily as needed (Anxiety). Patient not taking: Reported on 12/17/2015 08/24/15   AgEncarnacion SlatesNP    Physical Exam: Vitals:   12/17/15 1413 12/17/15 1556 12/17/15 1633 12/17/15 1656  BP: 101/79 111/62 105/83 105/76  Pulse: 93 106 108 93  Resp: 17 18 24  (!) 22  Temp: 98 F (36.7 C)   97.7 F (36.5 C)  TempSrc:    Oral  SpO2: 97% 98% 98% 99%  Weight:    100.5 kg (221 lb 9 oz)  Height:    5' (1.524 m)      Constitutional: NAD, calm, comfortable Vitals:   12/17/15 1413 12/17/15 1556 12/17/15 1633 12/17/15 1656  BP: 101/79 111/62 105/83 105/76  Pulse: 93 106 108 93  Resp: 17 18 24  (!) 22  Temp: 98 F (36.7 C)   97.7 F (36.5 C)  TempSrc:    Oral  SpO2: 97% 98% 98% 99%  Weight:    100.5 kg (221 lb 9 oz)  Height:    5' (1.524 m)   Eyes: PERRL, lids and conjunctivae normal ENMT: Mucous membranes are moist. Posterior pharynx clear of any exudate or lesions.Normal dentition.  Neck: normal, supple, no masses, no thyromegaly Respiratory: clear to auscultation bilaterally, no wheezing, no crackles. Normal respiratory effort. No accessory muscle use.  Cardiovascular: Regular rate and rhythm, no murmurs / rubs / gallops. No extremity edema. 2+ pedal pulses. No carotid bruits.  Abdomen: no tenderness, no masses palpated. No hepatosplenomegaly. Bowel sounds positive.  Musculoskeletal: no clubbing / cyanosis. No joint deformity upper and lower extremities. Good ROM, no contractures. Normal muscle tone.  Skin: no rashes,  lesions, ulcers. No induration Neurologic: CN 2-12 grossly intact. Sensation intact, DTR normal. Strength 5/5 in all 4.  Psychiatric: Suicidal ideation. No homicidal ideation. Alert and oriented x 3. Normal mood. Affect is full    Labs on Admission: I have personally reviewed following labs and imaging studies  CBC:  Recent Labs Lab 12/17/15 1124  WBC 5.8  HGB 13.0  HCT 39.8  MCV 95.4  PLT 33309 Basic Metabolic Panel:  Recent Labs Lab 12/17/15 1124  NA 141  K 3.7  CL 110  CO2 24  GLUCOSE 100*  BUN 8  CREATININE 0.63  CALCIUM 9.7   GFR: Estimated Creatinine Clearance: 117.5 mL/min (by C-G formula based on SCr  of 0.8 mg/dL). Liver Function Tests:  Recent Labs Lab 12/17/15 1124  AST 21  ALT 10*  ALKPHOS 137*  BILITOT 0.3  PROT 7.6  ALBUMIN 4.0   No results for input(s): LIPASE, AMYLASE in the last 168 hours. No results for input(s): AMMONIA in the last 168 hours. Coagulation Profile: No results for input(s): INR, PROTIME in the last 168 hours. Cardiac Enzymes: No results for input(s): CKTOTAL, CKMB, CKMBINDEX, TROPONINI in the last 168 hours. BNP (last 3 results) No results for input(s): PROBNP in the last 8760 hours. HbA1C: No results for input(s): HGBA1C in the last 72 hours. CBG:  Recent Labs Lab 12/17/15 1112  GLUCAP 137*   Lipid Profile: No results for input(s): CHOL, HDL, LDLCALC, TRIG, CHOLHDL, LDLDIRECT in the last 72 hours. Thyroid Function Tests: No results for input(s): TSH, T4TOTAL, FREET4, T3FREE, THYROIDAB in the last 72 hours. Anemia Panel: No results for input(s): VITAMINB12, FOLATE, FERRITIN, TIBC, IRON, RETICCTPCT in the last 72 hours. Urine analysis:    Component Value Date/Time   COLORURINE YELLOW 08/18/2015 1652   APPEARANCEUR CLOUDY (A) 08/18/2015 1652   LABSPEC 1.009 08/18/2015 1652   PHURINE 6.5 08/18/2015 1652   GLUCOSEU NEGATIVE 08/18/2015 1652   HGBUR SMALL (A) 08/18/2015 1652   BILIRUBINUR NEGATIVE 08/18/2015 1652    KETONESUR NEGATIVE 08/18/2015 1652   PROTEINUR NEGATIVE 08/18/2015 1652   UROBILINOGEN 0.2 02/21/2015 1830   NITRITE NEGATIVE 08/18/2015 1652   LEUKOCYTESUR MODERATE (A) 08/18/2015 1652   Radiological Exams on Admission: Dg Abdomen 1 View  Result Date: 12/17/2015 CLINICAL DATA:  The patient reports swallowing 6 batteries yesterday. EXAM: ABDOMEN - 1 VIEW COMPARISON:  None. FINDINGS: Two radiopaque foreign bodies consistent with batteries are seen in the left upper quadrant the abdomen and likely in the stomach. Four additional batteries are seen clustered in the left lower quadrant of the abdomen and could be in small or large bowel. The bowel gas pattern is nonobstructive. IMPRESSION: Six batteries are identified with two in the stomach and four in small bowel or colon as above. Electronically Signed   By: Inge Rise M.D.   On: 12/17/2015 12:25    EKG: Independently reviewed.  Assessment/Plan Active Problems:   Ingestion of foreign body  Foreign body ingestion Patient gives a story of 8 total ingested batteries however only 6 seen on x-ray. Either way, will watch for spontaneous passage, however, will need to have low threshold if she does not eliminate them by tomorrow.  -Carb modified diet -KUB in AM -Gastroenterology following  Suicide attempt/history of suicide attempt/depression/Schizoaffective disorder, bipolar -suicide precautions -psychiatry consultation -continue zyprexa, doxepin, benztropine, amantadine, lithium and olanzapine  Hypothyroidism Last TSH of 0.771 in April 2017.  -continue synthroid 136mg daily  Allergies Continue flonase  ?DM2 Last A1C of 5.6. Currently on Metformin, however, this could be secondary to Zyprexa usage. - hold metformin - SSI sensitive   DVT prophylaxis: Lovenox Code Status: Full Code Family Communication: None at bedside/available Disposition Plan: Admit to inpatient psychiatry when medically stable Consults called: GI  (Dr. HBenson Norway; Psychiatry (with names) Admission status: Observation, medical floor   RCordelia PocheMD [facility]  If 7PM-7AM, please contact night-coverage [email or web address] Password [email or web address] 12/17/2015, 5:33 PM

## 2015-12-18 DIAGNOSIS — T189XXA Foreign body of alimentary tract, part unspecified, initial encounter: Secondary | ICD-10-CM | POA: Diagnosis not present

## 2015-12-18 DIAGNOSIS — R45851 Suicidal ideations: Secondary | ICD-10-CM | POA: Diagnosis not present

## 2015-12-18 DIAGNOSIS — F250 Schizoaffective disorder, bipolar type: Secondary | ICD-10-CM | POA: Diagnosis not present

## 2015-12-18 DIAGNOSIS — F329 Major depressive disorder, single episode, unspecified: Secondary | ICD-10-CM | POA: Diagnosis not present

## 2015-12-18 LAB — COMPREHENSIVE METABOLIC PANEL
ALT: 10 U/L — ABNORMAL LOW (ref 14–54)
AST: 19 U/L (ref 15–41)
Albumin: 3.5 g/dL (ref 3.5–5.0)
Alkaline Phosphatase: 113 U/L (ref 38–126)
Anion gap: 7 (ref 5–15)
BUN: 10 mg/dL (ref 6–20)
CO2: 26 mmol/L (ref 22–32)
Calcium: 9.1 mg/dL (ref 8.9–10.3)
Chloride: 107 mmol/L (ref 101–111)
Creatinine, Ser: 0.67 mg/dL (ref 0.44–1.00)
GFR calc Af Amer: >60 mL/min (ref >60)
GFR calc non Af Amer: >60 mL/min (ref >60)
Glucose, Bld: 128 mg/dL — ABNORMAL HIGH (ref 65–99)
Potassium: 3.8 mmol/L (ref 3.5–5.1)
Sodium: 140 mmol/L (ref 135–145)
Total Bilirubin: 0.4 mg/dL (ref 0.3–1.2)
Total Protein: 6.8 g/dL (ref 6.5–8.1)

## 2015-12-18 LAB — CBC
HCT: 36.5 % (ref 36.0–46.0)
Hemoglobin: 12.1 g/dL (ref 12.0–15.0)
MCH: 31.4 pg (ref 26.0–34.0)
MCHC: 33.2 g/dL (ref 30.0–36.0)
MCV: 94.8 fL (ref 78.0–100.0)
Platelets: 306 10*3/uL (ref 150–400)
RBC: 3.85 MIL/uL — ABNORMAL LOW (ref 3.87–5.11)
RDW: 13.1 % (ref 11.5–15.5)
WBC: 12.6 10*3/uL — ABNORMAL HIGH (ref 4.0–10.5)

## 2015-12-18 LAB — GLUCOSE, CAPILLARY
Glucose-Capillary: 131 mg/dL — ABNORMAL HIGH (ref 65–99)
Glucose-Capillary: 116 mg/dL — ABNORMAL HIGH (ref 65–99)
Glucose-Capillary: 139 mg/dL — ABNORMAL HIGH (ref 65–99)
Glucose-Capillary: 102 mg/dL — ABNORMAL HIGH (ref 65–99)

## 2015-12-18 MED ORDER — MORPHINE SULFATE (PF) 2 MG/ML IV SOLN
1.0000 mg | INTRAVENOUS | Status: DC | PRN
  Administered 2015-12-18: 1 mg via INTRAVENOUS
  Filled 2015-12-18: qty 1

## 2015-12-18 MED ORDER — MORPHINE SULFATE (PF) 2 MG/ML IV SOLN
2.0000 mg | Freq: Once | INTRAVENOUS | Status: AC
  Administered 2015-12-18: 2 mg via INTRAVENOUS
  Filled 2015-12-18: qty 1

## 2015-12-18 NOTE — Progress Notes (Signed)
PROGRESS NOTE    Holly [name]  QVZ:[id number] DOB: Dec 13, 1995 DOA: 12/17/2015 PCP: Geraldo Pitter, MD   Brief Narrative: She's a 20 year old female with history of past suicidal attempt and depression with an additional diagnosis of schizoaffective disorder, bipolar type and she presents to the emergency department after ingesting 6-8 AA batteries as a suicidal gesture. Gastroenterology was consult to and has decided to watch at this time. Initial KUB is significant for 6 batteries with 4 in the intestine and 2 in the stomach.   Assessment & Plan:   Active Problems:   Schizoaffective disorder, bipolar type (HCC)   Suicidal ideations   Ingestion of foreign body   Ingestion of foreign body No bowel movement yet. Mild leukocytosis. -Awaiting passage via bowel movements -KUB scheduled for tomorrow morning -Gastroenterology, Dr. Elnoria Howard following  Suicide attempt/history of suicide attempt/depression/Schizoaffective disorder, bipolar -suicide precautions -psychiatry consultation -continue zyprexa, doxepin, benztropine, amantadine, lithium and olanzapine  Hypothyroidism Last TSH of 0.771 in April 2017.  -continue synthroid daily  Allergies Continue flonase  ?DM2 Last A1C of 5.6. Currently on Metformin, however, this could be secondary to Zyprexa usage. Patient is unaware of diagnosis of diabetes. - hold metformin - SSI sensitive  DVT prophylaxis: Lovenox  Code Status: Full code  Family Communication: None at bedside Disposition Plan: Plan to discharge to inpatient behavioral health when medically stable   Consultants:   Gastroenterology, Dr. Elnoria Howard  Procedures:  None  Antimicrobials:   None    Subjective: Patient states that she has some very mild abdominal pain in her left side. Otherwise she feels okay. She has not had a bowel movement yet. No nausea or vomiting.  Objective: Vitals:   12/17/15 1656 12/17/15 2125 12/18/15 0022 12/18/15 0504  BP:  105/76 101/76  105/69  Pulse: 93 89  (!) 111  Resp: (!) 22 20  20   Temp: 97.7 F (36.5 C) 97.6 F (36.4 C) 98.4 F (36.9 C) 98.9 F (37.2 C)  TempSrc: Oral Oral Oral Oral  SpO2: 99% 99%  99%  Weight: 100.5 kg (221 lb 9 oz)     Height: 5' (1.524 m)       Intake/Output Summary (Last 24 hours) at 12/18/15 1118 Last data filed at 12/18/15 0800  Gross per 24 hour  Intake              610 ml  Output                0 ml  Net              610 ml   Filed Weights   12/17/15 1113 12/17/15 1656  Weight: 99.3 kg (219 lb) 100.5 kg (221 lb 9 oz)    Examination:  General exam: Appears calm and comfortable  Respiratory system: Clear to auscultation. Respiratory effort normal. Cardiovascular system: S1 & S2 heard, RRR. No  murmurs, rubs, gallops or clicks. No pedal edema. Gastrointestinal system: Abdomen is nondistended, soft and nontender. No organomegaly or masses felt. Normal bowel sounds heard. Central nervous system: Alert and oriented. No focal neurological deficits. Extremities: Symmetric 5 x 5 power. Skin: No rashes, lesions or ulcers    Data Reviewed: I have personally reviewed following labs and imaging studies  CBC:  Recent Labs Lab 12/17/15 1124 12/18/15 0544  WBC 5.8 12.6*  HGB 13.0 12.1  HCT 39.8 36.5  MCV 95.4 94.8  PLT 333 306   Basic Metabolic Panel:  Recent Labs Lab 12/17/15 1124 12/18/15 0544  NA 141 140  K 3.7 3.8  CL 110 107  CO2 24 26  GLUCOSE 100* 128*  BUN 8 10  CREATININE 0.63 0.67  CALCIUM 9.7 9.1   GFR: Estimated Creatinine Clearance: 117.5 mL/min (by C-G formula based on SCr of 0.8 mg/dL). Liver Function Tests:  Recent Labs Lab 12/17/15 1124 12/18/15 0544  AST 21 19  ALT 10* 10*  ALKPHOS 137* 113  BILITOT 0.3 0.4  PROT 7.6 6.8  ALBUMIN 4.0 3.5   No results for input(s): LIPASE, AMYLASE in the last 168 hours. No results for input(s): AMMONIA in the last 168 hours. Coagulation Profile: No results for input(s): INR, PROTIME  in the last 168 hours. Cardiac Enzymes: No results for input(s): CKTOTAL, CKMB, CKMBINDEX, TROPONINI in the last 168 hours. BNP (last 3 results) No results for input(s): PROBNP in the last 8760 hours. HbA1C: No results for input(s): HGBA1C in the last 72 hours. CBG:  Recent Labs Lab 12/17/15 1112 12/17/15 1751 12/17/15 2115 12/18/15 0716  GLUCAP 137* 123* 110* 131*   Lipid Profile: No results for input(s): CHOL, HDL, LDLCALC, TRIG, CHOLHDL, LDLDIRECT in the last 72 hours. Thyroid Function Tests: No results for input(s): TSH, T4TOTAL, FREET4, T3FREE, THYROIDAB in the last 72 hours. Anemia Panel: No results for input(s): VITAMINB12, FOLATE, FERRITIN, TIBC, IRON, RETICCTPCT in the last 72 hours. Sepsis Labs: No results for input(s): PROCALCITON, LATICACIDVEN in the last 168 hours.  No results found for this or any previous visit (from the past 240 hour(s)).       Radiology Studies: Dg Abdomen 1 View  Result Date: 12/17/2015 CLINICAL DATA:  The patient reports swallowing 6 batteries yesterday. EXAM: ABDOMEN - 1 VIEW COMPARISON:  None. FINDINGS: Two radiopaque foreign bodies consistent with batteries are seen in the left upper quadrant the abdomen and likely in the stomach. Four additional batteries are seen clustered in the left lower quadrant of the abdomen and could be in small or large bowel. The bowel gas pattern is nonobstructive. IMPRESSION: Six batteries are identified with two in the stomach and four in small bowel or colon as above. Electronically Signed   By: Drusilla Kanner M.D.   On: 12/17/2015 12:25        Scheduled Meds: . amantadine  100 mg Oral TID WC  . benztropine  1 mg Oral QHS  . doxepin  50 mg Oral QHS  . enoxaparin (LOVENOX) injection  40 mg Subcutaneous Q24H  . FLUoxetine  20 mg Oral Daily  . insulin aspart  0-9 Units Subcutaneous TID WC  . levothyroxine  100 mcg Oral QAC breakfast  . lithium carbonate  450 mg Oral Daily  . lithium carbonate  900  mg Oral QHS  . OLANZapine  10 mg Oral QHS  . OLANZapine  5 mg Oral 2 times per day   Continuous Infusions:    LOS: 0 days     Jacquelin Hawking, MD [facility] Pager [phone]  If 7PM-7AM, please contact night-coverage [email or web address] Password [email or web address] 12/18/2015, 11:18 AM
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
## 2015-12-18 NOTE — Progress Notes (Signed)
Subjective: No complaints.  No abdominal pain.  Objective: Vital signs in last 24 hours: Temp:  [97.6 F (36.4 C)-99.9 F (37.7 C)] 98.9 F (37.2 C) (08/11 0504) Pulse Rate:  [86-127] 111 (08/11 0504) Resp:  [14-24] 20 (08/11 0504) BP: (101-121)/(62-98) 105/69 (08/11 0504) SpO2:  [96 %-100 %] 99 % (08/11 0504) Weight:  [99.3 kg (219 lb)-100.5 kg (221 lb 9 oz)] 100.5 kg (221 lb 9 oz) (08/10 1656) Last BM Date: 12/16/15  Intake/Output from previous day: 08/10 0701 - 08/11 0700 In: 250 [P.O.:250] Out: -  Intake/Output this shift: Total I/O In: 360 [P.O.:360] Out: -   General appearance: alert and no distress GI: soft, non-tender; bowel sounds normal; no masses,  no organomegaly  Lab Results:  Recent Labs  12/17/15 1124 12/18/15 0544  WBC 5.8 12.6*  HGB 13.0 12.1  HCT 39.8 36.5  PLT 333 306   BMET  Recent Labs  12/17/15 1124 12/18/15 0544  NA 141 140  K 3.7 3.8  CL 110 107  CO2 24 26  GLUCOSE 100* 128*  BUN 8 10  CREATININE 0.63 0.67  CALCIUM 9.7 9.1   LFT  Recent Labs  12/18/15 0544  PROT 6.8  ALBUMIN 3.5  AST 19  ALT 10*  ALKPHOS 113  BILITOT 0.4   PT/INR No results for input(s): LABPROT, INR in the last 72 hours. Hepatitis Panel No results for input(s): HEPBSAG, HCVAB, HEPAIGM, HEPBIGM in the last 72 hours. C-Diff No results for input(s): CDIFFTOX in the last 72 hours. Fecal Lactopherrin No results for input(s): FECLLACTOFRN in the last 72 hours.  Studies/Results: Dg Abdomen 1 View  Result Date: 12/17/2015 CLINICAL DATA:  The patient reports swallowing 6 batteries yesterday. EXAM: ABDOMEN - 1 VIEW COMPARISON:  None. FINDINGS: Two radiopaque foreign bodies consistent with batteries are seen in the left upper quadrant the abdomen and likely in the stomach. Four additional batteries are seen clustered in the left lower quadrant of the abdomen and could be in small or large bowel. The bowel gas pattern is nonobstructive. IMPRESSION: Six  batteries are identified with two in the stomach and four in small bowel or colon as above. Electronically Signed   By: Drusilla Kannerhomas  Dalessio M.D.   On: 12/17/2015 12:25    Medications:  Scheduled: . amantadine  100 mg Oral TID WC  . benztropine  1 mg Oral QHS  . doxepin  50 mg Oral QHS  . enoxaparin (LOVENOX) injection  40 mg Subcutaneous Q24H  . FLUoxetine  20 mg Oral Daily  . insulin aspart  0-9 Units Subcutaneous TID WC  . levothyroxine  100 mcg Oral QAC breakfast  . lithium carbonate  450 mg Oral Daily  . lithium carbonate  900 mg Oral QHS  . OLANZapine  10 mg Oral QHS  . OLANZapine  5 mg Oral 2 times per day   Continuous:   Assessment/Plan: 1) Caustic ingestion - 8 AA batteries. 2) Suicidal ideation.   The patient remains clinically stable.  No complaints with abdominal pain, but her WBC is mildly elevated at 12.  Her vital signs are normal.  She denies a bowel movement.   Plan: 1) Continue supportive care. 2) KUB tomorrow. 3) Suicide precautions.  LOS: 0 days   Elston Aldape D 12/18/2015, 10:44 AM

## 2015-12-19 ENCOUNTER — Observation Stay (HOSPITAL_COMMUNITY): Payer: Medicaid Other

## 2015-12-19 DIAGNOSIS — F32A Depression, unspecified: Secondary | ICD-10-CM

## 2015-12-19 DIAGNOSIS — F259 Schizoaffective disorder, unspecified: Secondary | ICD-10-CM | POA: Diagnosis not present

## 2015-12-19 DIAGNOSIS — T1491 Suicide attempt: Secondary | ICD-10-CM | POA: Diagnosis present

## 2015-12-19 DIAGNOSIS — E739 Lactose intolerance, unspecified: Secondary | ICD-10-CM | POA: Diagnosis present

## 2015-12-19 DIAGNOSIS — F329 Major depressive disorder, single episode, unspecified: Secondary | ICD-10-CM | POA: Diagnosis not present

## 2015-12-19 DIAGNOSIS — F909 Attention-deficit hyperactivity disorder, unspecified type: Secondary | ICD-10-CM | POA: Diagnosis present

## 2015-12-19 DIAGNOSIS — X838XXA Intentional self-harm by other specified means, initial encounter: Secondary | ICD-10-CM | POA: Diagnosis not present

## 2015-12-19 DIAGNOSIS — Z91018 Allergy to other foods: Secondary | ICD-10-CM | POA: Diagnosis not present

## 2015-12-19 DIAGNOSIS — Z79899 Other long term (current) drug therapy: Secondary | ICD-10-CM | POA: Diagnosis not present

## 2015-12-19 DIAGNOSIS — T189XXD Foreign body of alimentary tract, part unspecified, subsequent encounter: Secondary | ICD-10-CM

## 2015-12-19 DIAGNOSIS — T184XXA Foreign body in colon, initial encounter: Secondary | ICD-10-CM | POA: Diagnosis present

## 2015-12-19 DIAGNOSIS — T180XXA Foreign body in mouth, initial encounter: Secondary | ICD-10-CM

## 2015-12-19 DIAGNOSIS — E039 Hypothyroidism, unspecified: Secondary | ICD-10-CM | POA: Diagnosis present

## 2015-12-19 DIAGNOSIS — F4310 Post-traumatic stress disorder, unspecified: Secondary | ICD-10-CM | POA: Diagnosis present

## 2015-12-19 DIAGNOSIS — T189XXA Foreign body of alimentary tract, part unspecified, initial encounter: Secondary | ICD-10-CM | POA: Diagnosis not present

## 2015-12-19 DIAGNOSIS — T182XXA Foreign body in stomach, initial encounter: Secondary | ICD-10-CM | POA: Diagnosis not present

## 2015-12-19 DIAGNOSIS — E119 Type 2 diabetes mellitus without complications: Secondary | ICD-10-CM | POA: Diagnosis present

## 2015-12-19 DIAGNOSIS — D72829 Elevated white blood cell count, unspecified: Secondary | ICD-10-CM | POA: Diagnosis present

## 2015-12-19 DIAGNOSIS — Z915 Personal history of self-harm: Secondary | ICD-10-CM | POA: Diagnosis not present

## 2015-12-19 DIAGNOSIS — Z6841 Body Mass Index (BMI) 40.0 and over, adult: Secondary | ICD-10-CM | POA: Diagnosis not present

## 2015-12-19 DIAGNOSIS — Z7984 Long term (current) use of oral hypoglycemic drugs: Secondary | ICD-10-CM | POA: Diagnosis not present

## 2015-12-19 DIAGNOSIS — R45851 Suicidal ideations: Secondary | ICD-10-CM | POA: Diagnosis not present

## 2015-12-19 DIAGNOSIS — F70 Mild intellectual disabilities: Secondary | ICD-10-CM | POA: Diagnosis present

## 2015-12-19 DIAGNOSIS — F250 Schizoaffective disorder, bipolar type: Secondary | ICD-10-CM | POA: Diagnosis present

## 2015-12-19 LAB — BASIC METABOLIC PANEL
Anion gap: 9 (ref 5–15)
BUN: 8 mg/dL (ref 6–20)
CO2: 21 mmol/L — ABNORMAL LOW (ref 22–32)
Calcium: 8.8 mg/dL — ABNORMAL LOW (ref 8.9–10.3)
Chloride: 108 mmol/L (ref 101–111)
Creatinine, Ser: 0.59 mg/dL (ref 0.44–1.00)
GFR calc Af Amer: >60 mL/min (ref >60)
GFR calc non Af Amer: >60 mL/min (ref >60)
Glucose, Bld: 141 mg/dL — ABNORMAL HIGH (ref 65–99)
Potassium: 3.6 mmol/L (ref 3.5–5.1)
Sodium: 138 mmol/L (ref 135–145)

## 2015-12-19 LAB — GLUCOSE, CAPILLARY
Glucose-Capillary: 131 mg/dL — ABNORMAL HIGH (ref 65–99)
Glucose-Capillary: 125 mg/dL — ABNORMAL HIGH (ref 65–99)
Glucose-Capillary: 88 mg/dL (ref 65–99)
Glucose-Capillary: 99 mg/dL (ref 65–99)

## 2015-12-19 LAB — CBC
HCT: 36.1 % (ref 36.0–46.0)
Hemoglobin: 12.1 g/dL (ref 12.0–15.0)
MCH: 32 pg (ref 26.0–34.0)
MCHC: 33.5 g/dL (ref 30.0–36.0)
MCV: 95.5 fL (ref 78.0–100.0)
Platelets: 306 10*3/uL (ref 150–400)
RBC: 3.78 MIL/uL — ABNORMAL LOW (ref 3.87–5.11)
RDW: 13.3 % (ref 11.5–15.5)
WBC: 9.5 10*3/uL (ref 4.0–10.5)

## 2015-12-19 MED ORDER — BISACODYL 5 MG PO TBEC
5.0000 mg | DELAYED_RELEASE_TABLET | Freq: Every day | ORAL | Status: DC
  Administered 2015-12-19 – 2015-12-22 (×4): 5 mg via ORAL
  Filled 2015-12-19 (×4): qty 1

## 2015-12-19 NOTE — Progress Notes (Signed)
   Patient Name: Holly [name] Date of Encounter: 12/19/2015, 11:45 AM    Subjective  No c/o   Objective  BP 104/81 (BP Location: Right Arm)   Pulse 89   Temp 98 F (36.7 C) (Oral)   Resp 20   Ht 5' (1.524 m)   Wt 221 lb 9 oz (100.5 kg)   SpO2 97%   BMI 43.27 kg/m    7 batteries in the colon on KUB  Assessment and Plan  Foreign bodies of colon - AA batteries - moving through.  Watch for passage - will give bisacodyl today to help move along Daily KUB or count them as they come out until all passed - does not necessarily need medical stay until that is done though in her case seems prudent to be in a supervised setting Call back if ?'s Signing off  Iva Booparl E. Kaimen Peine, MD, [facility] [phone] (pager) [phone] after 5 PM, weekends and holidays  12/19/2015 11:45 AM

## 2015-12-19 NOTE — Consult Note (Signed)
[place] [facility] Consult   Reason for Consult: Suicidal Ideations/Behaviors  Referring Physician:  Dr. Lonny Prude  Patient Identification: Holly [name] MRN:  [id number] Principal Diagnosis: Ingestion of foreign body  Diagnosis:   Patient Active Problem List   Diagnosis Date Noted  . Ingestion of foreign body [T18.9XXA] 12/17/2015  . Hallucinations [R44.3]   . Suicidal ideations [R45.851]   . Injury, self-inflicted [F16.3]   . Schizoaffective disorder, bipolar type ([place]) [F25.0]   . 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 Psychosis [F29] 03/18/2014  . ADHD (attention deficit hyperactivity disorder) [F90.9] 01/03/2013  . PTSD (post-traumatic stress disorder) [F43.10] 01/03/2013  . Agitation [R45.1] 01/03/2013    Total Time spent with patient: 30 minutes  Subjective:   Holly [name] is a 20 y.o. female patient admitted with ingestion of foreign body.  HPI:  Patient is a 20 year old single female, no children, on disability,lives with a caregiver. She has a history of mental illness and has been diagnosed with Schizoaffective Disorder. She has a history of several past psychiatric admissions , most recently in April 2017. Patient states she recently felt worse, acutely depressed ( at this time denies any triggering event, chart notes indicate she had a verbal altercation )  and initially tried to tie a shoestring around her neck. Later she ingested 6 (?)  batteries ( from her music  Player device  )within a period of two days. States she did think this was going to cause her death and it was a suicide attempt. She has been evaluated by GI specialist, and at this time foreign objects are expected to pass through without need for surgery. Patient has visible scratches on forearm and states she was scratching when feeling upset . She states she has been compliant with her psychiatric medications and denies any drug or alcohol abuse   Today reports feeling "OK".  Past  Psychiatric History:  Several prior psychiatric admissions since adolescence, has been diagnosed with Schizoaffective  Disorder, PTSD.    Risk to Self: Is patient at risk for suicide?: Yes Risk to Others:  currently denies  Prior Inpatient Therapy:  yes  Prior Outpatient Therapy:  yes   Past Medical History:  Past Medical History:  Diagnosis Date  . ADHD (attention deficit hyperactivity disorder)   . Bipolar 1 disorder ([place])   . Depression   . Diabetes mellitus   . Gluten free diet   . Hypothyroidism   . Mood disorder ([place])   . 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  . Obesity   . PTSD (post-traumatic stress disorder)     Past Surgical History:  Procedure Laterality Date  . NO PAST SURGERIES    . None     Family History:  Patient has reported limited knowledge of biological family's psychiatric history Family History  Problem Relation Age of Onset  . Adopted: Yes   Family Psychiatric  History: as above  Social History:  Single, no children, living with a caregiver, on disability History  Alcohol Use No     History  Drug Use No    Social History   Social History  . Marital status: Single    Spouse name: N/A  . Number of children: N/A  . Years of education: N/A   Social History Main Topics  . Smoking status: Never Smoker  . Smokeless tobacco: Never Used  . Alcohol use No  . Drug use: No  . Sexual activity: No   Other Topics Concern  .  None   Social History Narrative  . None   Additional Social History:    Allergies:   Allergies  Allergen Reactions  . Gluten Meal Other (See Comments)    Upset stomach - patient denies  . Lactose Intolerance (Gi) Nausea Only and Other (See Comments)    Upset stomach    Labs:  Results for orders placed or performed during the hospital encounter of 12/17/15 (from the past 48 hour(s))  Glucose, capillary     Status: Abnormal   Collection Time: 12/17/15  5:51 PM  Result Value Ref Range   Glucose-Capillary 123 (H) 65 - 99  mg/dL  Glucose, capillary     Status: Abnormal   Collection Time: 12/17/15  9:15 PM  Result Value Ref Range   Glucose-Capillary 110 (H) 65 - 99 mg/dL  Comprehensive metabolic panel     Status: Abnormal   Collection Time: 12/18/15  5:44 AM  Result Value Ref Range   Sodium 140 135 - 145 mmol/L   Potassium 3.8 3.5 - 5.1 mmol/L   Chloride 107 101 - 111 mmol/L   CO2 26 22 - 32 mmol/L   Glucose, Bld 128 (H) 65 - 99 mg/dL   BUN 10 6 - 20 mg/dL   Creatinine, Ser 0.67 0.44 - 1.00 mg/dL   Calcium 9.1 8.9 - 10.3 mg/dL   Total Protein 6.8 6.5 - 8.1 g/dL   Albumin 3.5 3.5 - 5.0 g/dL   AST 19 15 - 41 U/L   ALT 10 (L) 14 - 54 U/L   Alkaline Phosphatase 113 38 - 126 U/L   Total Bilirubin 0.4 0.3 - 1.2 mg/dL   GFR calc non Af Amer >60 >60 mL/min   GFR calc Af Amer >60 >60 mL/min    Comment: (NOTE) The eGFR has been calculated using the CKD EPI equation. This calculation has not been validated in all clinical situations. eGFR's persistently <60 mL/min signify possible Chronic Kidney Disease.    Anion gap 7 5 - 15  CBC     Status: Abnormal   Collection Time: 12/18/15  5:44 AM  Result Value Ref Range   WBC 12.6 (H) 4.0 - 10.5 K/uL   RBC 3.85 (L) 3.87 - 5.11 MIL/uL   Hemoglobin 12.1 12.0 - 15.0 g/dL   HCT 36.5 36.0 - 46.0 %   MCV 94.8 78.0 - 100.0 fL   MCH 31.4 26.0 - 34.0 pg   MCHC 33.2 30.0 - 36.0 g/dL   RDW 13.1 11.5 - 15.5 %   Platelets 306 150 - 400 K/uL  Glucose, capillary     Status: Abnormal   Collection Time: 12/18/15  7:16 AM  Result Value Ref Range   Glucose-Capillary 131 (H) 65 - 99 mg/dL   Comment 1 Notify RN   Glucose, capillary     Status: Abnormal   Collection Time: 12/18/15 11:31 AM  Result Value Ref Range   Glucose-Capillary 116 (H) 65 - 99 mg/dL  Glucose, capillary     Status: Abnormal   Collection Time: 12/18/15  5:12 PM  Result Value Ref Range   Glucose-Capillary 139 (H) 65 - 99 mg/dL  Glucose, capillary     Status: Abnormal   Collection Time: 12/18/15  9:43  PM  Result Value Ref Range   Glucose-Capillary 102 (H) 65 - 99 mg/dL  Glucose, capillary     Status: Abnormal   Collection Time: 12/19/15  7:14 AM  Result Value Ref Range   Glucose-Capillary 131 (H) 65 - 99  mg/dL  CBC     Status: Abnormal   Collection Time: 12/19/15  8:52 AM  Result Value Ref Range   WBC 9.5 4.0 - 10.5 K/uL   RBC 3.78 (L) 3.87 - 5.11 MIL/uL   Hemoglobin 12.1 12.0 - 15.0 g/dL   HCT 36.1 36.0 - 46.0 %   MCV 95.5 78.0 - 100.0 fL   MCH 32.0 26.0 - 34.0 pg   MCHC 33.5 30.0 - 36.0 g/dL   RDW 13.3 11.5 - 15.5 %   Platelets 306 150 - 400 K/uL  Basic metabolic panel     Status: Abnormal   Collection Time: 12/19/15  8:52 AM  Result Value Ref Range   Sodium 138 135 - 145 mmol/L   Potassium 3.6 3.5 - 5.1 mmol/L   Chloride 108 101 - 111 mmol/L   CO2 21 (L) 22 - 32 mmol/L   Glucose, Bld 141 (H) 65 - 99 mg/dL   BUN 8 6 - 20 mg/dL   Creatinine, Ser 0.59 0.44 - 1.00 mg/dL   Calcium 8.8 (L) 8.9 - 10.3 mg/dL   GFR calc non Af Amer >60 >60 mL/min   GFR calc Af Amer >60 >60 mL/min    Comment: (NOTE) The eGFR has been calculated using the CKD EPI equation. This calculation has not been validated in all clinical situations. eGFR's persistently <60 mL/min signify possible Chronic Kidney Disease.    Anion gap 9 5 - 15  Glucose, capillary     Status: Abnormal   Collection Time: 12/19/15 10:47 AM  Result Value Ref Range   Glucose-Capillary 125 (H) 65 - 99 mg/dL    Current Facility-Administered Medications  Medication Dose Route Frequency Provider Last Rate Last Dose  . acetaminophen (TYLENOL) tablet 650 mg  650 mg Oral Q6H PRN Gardiner Barefoot, NP   650 mg at 12/19/15 0208  . alum & mag hydroxide-simeth (MAALOX/MYLANTA) 200-200-20 MG/5ML suspension 30 mL  30 mL Oral Q6H PRN Mariel Aloe, MD   30 mL at 12/19/15 0208  . amantadine (SYMMETREL) capsule 100 mg  100 mg Oral TID WC Mariel Aloe, MD   100 mg at 12/19/15 1200  . benztropine (COGENTIN) tablet 1 mg  1 mg Oral QHS  Mariel Aloe, MD   1 mg at 12/18/15 2122  . doxepin (SINEQUAN) capsule 50 mg  50 mg Oral QHS Mariel Aloe, MD   50 mg at 12/18/15 2122  . enoxaparin (LOVENOX) injection 40 mg  40 mg Subcutaneous Q24H Mariel Aloe, MD   40 mg at 12/18/15 2122  . FLUoxetine (PROZAC) capsule 20 mg  20 mg Oral Daily Mariel Aloe, MD   20 mg at 12/19/15 1118  . fluticasone (FLONASE) 50 MCG/ACT nasal spray 1 spray  1 spray Each Nare Daily PRN Mariel Aloe, MD      . insulin aspart (novoLOG) injection 0-9 Units  0-9 Units Subcutaneous TID WC Mariel Aloe, MD   1 Units at 12/19/15 1225  . levothyroxine (SYNTHROID, LEVOTHROID) tablet 100 mcg  100 mcg Oral QAC breakfast Mariel Aloe, MD   100 mcg at 12/19/15 0811  . lithium carbonate (ESKALITH) CR tablet 450 mg  450 mg Oral Daily Mariel Aloe, MD   450 mg at 12/19/15 1118  . lithium carbonate (ESKALITH) CR tablet 900 mg  900 mg Oral QHS Mariel Aloe, MD   900 mg at 12/18/15 2121  . morphine 2 MG/ML injection 1 mg  1  mg Intravenous Q3H PRN Gardiner Barefoot, NP   1 mg at 12/18/15 0407  . OLANZapine (ZYPREXA) tablet 10 mg  10 mg Oral QHS Mariel Aloe, MD   10 mg at 12/18/15 2121  . OLANZapine (ZYPREXA) tablet 5 mg  5 mg Oral 2 times per day Mariel Aloe, MD   5 mg at 12/19/15 1587    Musculoskeletal: Strength & Muscle Tone: within normal limits Gait & Station: normal- gait not examined  Patient leans: N/A  Psychiatric Specialty Exam: Physical Exam  ROS denies headache or vomiting   Blood pressure 104/81, pulse 89, temperature 98 F (36.7 C), temperature source Oral, resp. rate 20, height 5' (1.524 m), weight 221 lb 9 oz (100.5 kg), SpO2 97 %.Body mass index is 43.27 kg/m.  General Appearance: Fairly Groomed  Eye Contact:  Good  Speech:  Normal Rate  Volume:  Normal  Mood:  states today feeling better   Affect:  mildly constricted but reactive   Thought Process:  Linear  Orientation:  Other:  fully alert and attentive   Thought Content:   at present denies hallucinations and does not appear internally preoccupied   Suicidal Thoughts:  Yes.  without intent/plan denies current plan or intentions .   Homicidal Thoughts:  No  Memory:  recent and remote grossly intact   Judgement:  Impaired  Insight:  Fair  Psychomotor Activity:  Normal  Concentration:  Concentration: Good and Attention Span: Good  Recall:  Good  Fund of Knowledge:  Good  Language:  Good  Akathisia:  Negative  Handed:  Right  AIMS (if indicated):     Assets:  Desire for Improvement Resilience  ADL's:  Intact  Cognition:  WNL  Sleep:        Treatment Plan Summary: Patient meets criteria for inpatient admission to psychiatric unit once medically cleared and she is stating she agrees with this plan   Disposition: Recommend psychiatric Inpatient admission when medically cleared. Consider changing Zyprexa to 5 mgrs QAM and 10 mgrs QHS  Continue Prozac 20 mgrs QDAY  Continue Lithium 450 mgrs QAM and 900 mgrs QHS  Would check Lithium serum level  Consider D/Cing Cogentin and either D/Cing Doxepine or decreasing dose to 25 mgrs QHS - these meds may be associated with constipation and delay passage of foreign objects  Neita Garnet, MD 12/19/2015 12:38 PM

## 2015-12-19 NOTE — Progress Notes (Signed)
PROGRESS NOTE    Holly [name]  ZOX:[id number] DOB: [birth year] DOA: 12/17/2015 PCP: Geraldo PitterBLAND,VEITA J, MD   Brief Narrative: She's a 20 year old female with history of past suicidal attempt and depression with an additional diagnosis of schizoaffective disorder, bipolar type and she presents to the emergency department after ingesting 6-8 AA batteries as a suicidal gesture. Gastroenterology was consult to and has decided to watch at this time. Initial KUB is significant for 6 batteries with 4 in the intestine and 2 in the stomach. Repeat KUB shows movement of batteries with a count of 7.   Assessment & Plan:   Active Problems:   Schizoaffective disorder, bipolar type ([facility])   Suicidal ideations   Ingestion of foreign body   Ingestion of foreign body Patient had a bowel movement this morning, however, no batteries. Leukocytosis resolved. -Awaiting passage via bowel movements -We'll schedule a KUB for 48 hours -Gastroenterology, Dr. Elnoria HowardHung following  Suicide attempt/history of suicide attempt/depression/Schizoaffective disorder, bipolar -suicide precautions -psychiatry consultation pending -continue zyprexa, doxepin, benztropine, amantadine, lithium and olanzapine  Hypothyroidism Last TSH of 0.771 in April 2017.  -continue synthroid 100mcg daily  Allergies Continue flonase  ?DM2 Last A1C of 5.6. Currently on Metformin, however, this could be secondary to Zyprexa usage. Patient is unaware of diagnosis of diabetes. - hold metformin - SSI sensitive  DVT prophylaxis: Lovenox  Code Status: Full code  Family Communication: None at bedside Disposition Plan: Plan to discharge to inpatient behavioral health when medically stable   Consultants:   Gastroenterology, Dr. Elnoria HowardHung  Psychiatry  Procedures:  None  Antimicrobials:   None    Subjective: Patient states that she has no abdominal pain today. She is been coloring in a coloring book. After my visit with the  patient, the nurse told me that she had a movement without any passage of batteries.  Objective: Vitals:   12/18/15 0504 12/18/15 1454 12/18/15 2000 12/19/15 0620  BP: 105/69 106/70 110/68 104/81  Pulse: (!) 111 (!) 113 95 89  Resp: 20 20 19 20   Temp: 98.9 F (37.2 C) 99.1 F (37.3 C) 97.8 F (36.6 C) 98 F (36.7 C)  TempSrc: Oral Oral Oral Oral  SpO2: 99% 97% 97% 97%  Weight:      Height:        Intake/Output Summary (Last 24 hours) at 12/19/15 1240 Last data filed at 12/19/15 0626  Gross per 24 hour  Intake             1200 ml  Output                0 ml  Net             1200 ml   Filed Weights   12/17/15 1113 12/17/15 1656  Weight: 99.3 kg (219 lb) 100.5 kg (221 lb 9 oz)    Examination:  General exam: Appears calm and comfortable  Respiratory system: Clear to auscultation. Respiratory effort normal. Cardiovascular system: S1 & S2 heard, RRR. No  murmurs, rubs, gallops or clicks. No pedal edema. Gastrointestinal system: Abdomen is nondistended, soft and nontender. No organomegaly or masses felt. Normal bowel sounds heard. Central nervous system: Alert and oriented. No focal neurological deficits. Extremities: Symmetric 5 x 5 power. Skin: No rashes, lesions or ulcers    Data Reviewed: I have personally reviewed following labs and imaging studies  CBC:  Recent Labs Lab 12/17/15 1124 12/18/15 0544 12/19/15 0852  WBC 5.8 12.6* 9.5  HGB 13.0 12.1 12.1  HCT  39.8 36.5 36.1  MCV 95.4 94.8 95.5  PLT 333 306 306   Basic Metabolic Panel:  Recent Labs Lab 12/17/15 1124 12/18/15 0544 12/19/15 0852  NA 141 140 138  K 3.7 3.8 3.6  CL 110 107 108  CO2 24 26 21*  GLUCOSE 100* 128* 141*  BUN CREATININE 0.63 0.67 0.59  CALCIUM 9.7 9.1 8.8*   GFR: Estimated Creatinine Clearance: 117.5 mL/min (by C-G formula based on SCr of 0.8 mg/dL). Liver Function Tests:  Recent Labs Lab 12/17/15 1124 12/18/15 0544  AST 21 19  ALT 10* 10*  ALKPHOS 137* 113   BILITOT 0.3 0.4  PROT 7.6 6.8  ALBUMIN 4.0 3.5   No results for input(s): LIPASE, AMYLASE in the last 168 hours. No results for input(s): AMMONIA in the last 168 hours. Coagulation Profile: No results for input(s): INR, PROTIME in the last 168 hours. Cardiac Enzymes: No results for input(s): CKTOTAL, CKMB, CKMBINDEX, TROPONINI in the last 168 hours. BNP (last 3 results) No results for input(s): PROBNP in the last 8760 hours. HbA1C: No results for input(s): HGBA1C in the last 72 hours. CBG:  Recent Labs Lab 12/18/15 1131 12/18/15 1712 12/18/15 2143 12/19/15 0714 12/19/15 1047  GLUCAP 116* 139* 102* 131* 125*   Lipid Profile: No results for input(s): CHOL, HDL, LDLCALC, TRIG, CHOLHDL, LDLDIRECT in the last 72 hours. Thyroid Function Tests: No results for input(s): TSH, T4TOTAL, FREET4, T3FREE, THYROIDAB in the last 72 hours. Anemia Panel: No results for input(s): VITAMINB12, FOLATE, FERRITIN, TIBC, IRON, RETICCTPCT in the last 72 hours. Sepsis Labs: No results for input(s): PROCALCITON, LATICACIDVEN in the last 168 hours.  No results found for this or any previous visit (from the past 240 hour(s)).       Radiology Studies: Abd 1 View (kub)  Result Date: 12/19/2015 CLINICAL DATA:  Foreign body ingestion. EXAM: ABDOMEN - 1 VIEW COMPARISON:  12/17/2015 FINDINGS: Seven batteries are present in the abdomen. One battery in the cecum. The remainder in the hepatic flexure and transverse colon. These have progressed since the prior study. No bowel obstruction. IMPRESSION: 7 batteries are present in the colon. Electronically Signed   By: Marlan Palau M.D.   On: 12/19/2015 07:20        Scheduled Meds: . amantadine  100 mg Oral TID WC  . benztropine  1 mg Oral QHS  . doxepin  50 mg Oral QHS  . enoxaparin (LOVENOX) injection  40 mg Subcutaneous Q24H  . FLUoxetine  20 mg Oral Daily  . insulin aspart  0-9 Units Subcutaneous TID WC  . levothyroxine  100 mcg Oral QAC  breakfast  . lithium carbonate  450 mg Oral Daily  . lithium carbonate  900 mg Oral QHS  . OLANZapine  10 mg Oral QHS  . OLANZapine  5 mg Oral 2 times per day   Continuous Infusions:    LOS: 0 days     Jacquelin Hawking, MD [facility] Pager [phone]  If 7PM-7AM, please contact night-coverage [email or web address] Password [facility] 12/19/2015, 12:40 PM

## 2015-12-20 LAB — LITHIUM LEVEL: Lithium Lvl: 0.53 mmol/L — ABNORMAL LOW (ref 0.60–1.20)

## 2015-12-20 LAB — GLUCOSE, CAPILLARY
Glucose-Capillary: 136 mg/dL — ABNORMAL HIGH (ref 65–99)
Glucose-Capillary: 95 mg/dL (ref 65–99)
Glucose-Capillary: 107 mg/dL — ABNORMAL HIGH (ref 65–99)
Glucose-Capillary: 100 mg/dL — ABNORMAL HIGH (ref 65–99)

## 2015-12-20 MED ORDER — DOXEPIN HCL 25 MG PO CAPS
25.0000 mg | ORAL_CAPSULE | Freq: Every day | ORAL | Status: DC
  Administered 2015-12-20 – 2015-12-21 (×2): 25 mg via ORAL
  Filled 2015-12-20 (×3): qty 1

## 2015-12-20 NOTE — Progress Notes (Signed)
PROGRESS NOTE    Holly [name]  OZH:[id number] DOB: 03-21-1996 DOA: 12/17/2015 PCP: Geraldo Pitter, MD   Brief Narrative: She's a 20 year old female with history of past suicidal attempt and depression with an additional diagnosis of schizoaffective disorder, bipolar type and she presents to the emergency department after ingesting 6-8 AA batteries as a suicidal gesture. Gastroenterology was consult to and has decided to watch at this time. Initial KUB is significant for 6 batteries with 4 in the intestine and 2 in the stomach. Repeat KUB shows movement of batteries with a count of 7.   Assessment & Plan:   Active Problems:   Schizoaffective disorder, bipolar type (HCC)   Suicidal ideation   Ingestion of foreign body   Depression   Ingestion of foreign body Patient had her first bowel movement with past 2 batteries Leukocytosis resolved. -Awaiting passage via bowel movements -KUB scheduled for tomorrow -Gastroenterology, Dr. Elnoria Howard following  Suicide attempt/history of suicide attempt/depression/Schizoaffective disorder, bipolar Patient qualifies for inpatient psych. Patient will be transferred to behavioral health once medically stable -suicide precautions -zyprexa 5 mg in the morning and 10 mg at night -Discontinue doxepin, benztropine -Continue amantadine, lithium -Check lithium level  Hypothyroidism Last TSH of 0.771 in April 2017.  -continue synthroid daily  Allergies Continue flonase  ?DM2 Last A1C of 5.6. Currently on Metformin, however, this could be secondary to Zyprexa usage. Patient is unaware of diagnosis of diabetes. - hold metformin - SSI sensitive  DVT prophylaxis: Lovenox  Code Status: Full code  Family Communication: None at bedside Disposition Plan: Plan to discharge to inpatient behavioral health when medically stable   Consultants:   Gastroenterology, Dr. Elnoria Howard  Psychiatry  Procedures:  None  Antimicrobials:   None     Subjective: Patient reports no abdominal pain. She had a bowel movement with passage of 2 batteries seen.  Objective: Vitals:   12/19/15 1400 12/19/15 2050 12/20/15 0348 12/20/15 1351  BP: 120/66 (!) 99/59 106/71 110/75  Pulse: 82 84 80 (!) 105  Resp: 20 (!) 24 20 20   Temp: 98.6 F (37 C) 97.6 F (36.4 C) 98.6 F (37 C) 98 F (36.7 C)  TempSrc: Oral Oral Oral Oral  SpO2: 97% 100% 99% 99%  Weight:      Height:        Intake/Output Summary (Last 24 hours) at 12/20/15 1556 Last data filed at 12/20/15 1250  Gross per 24 hour  Intake             1740 ml  Output                0 ml  Net             1740 ml   Filed Weights   12/17/15 1113 12/17/15 1656  Weight: 99.3 kg (219 lb) 100.5 kg (221 lb 9 oz)    Examination:  General exam: Appears calm and comfortable  Respiratory system: Clear to auscultation. Respiratory effort normal. Cardiovascular system: S1 & S2 heard, RRR. No  murmurs, rubs, gallops or clicks. No pedal edema. Gastrointestinal system: Abdomen is nondistended, soft and nontender. No organomegaly or masses felt. Normal bowel sounds heard. Central nervous system: Alert and oriented. No focal neurological deficits. Extremities: Symmetric 5 x 5 power. Skin: No rashes, lesions or ulcers    Data Reviewed: I have personally reviewed following labs and imaging studies  CBC:  Recent Labs Lab 12/17/15 1124 12/18/15 0544 12/19/15 0852  WBC 5.8 12.6* 9.5  HGB 13.0  12.1 12.1  HCT 39.8 36.5 36.1  MCV 95.4 94.8 95.5  PLT 333 306 306   Basic Metabolic Panel:  Recent Labs Lab 12/17/15 1124 12/18/15 0544 12/19/15 0852  NA 141 140 138  K 3.7 3.8 3.6  CL 110 107 108  CO2 24 26 21*  GLUCOSE 100* 128* 141*  BUN 8 10 8   CREATININE 0.63 0.67 0.59  CALCIUM 9.7 9.1 8.8*   GFR: Estimated Creatinine Clearance: 117.5 mL/min (by C-G formula based on SCr of 0.8 mg/dL). Liver Function Tests:  Recent Labs Lab 12/17/15 1124 12/18/15 0544  AST 21 19  ALT  10* 10*  ALKPHOS 137* 113  BILITOT 0.3 0.4  PROT 7.6 6.8  ALBUMIN 4.0 3.5   No results for input(s): LIPASE, AMYLASE in the last 168 hours. No results for input(s): AMMONIA in the last 168 hours. Coagulation Profile: No results for input(s): INR, PROTIME in the last 168 hours. Cardiac Enzymes: No results for input(s): CKTOTAL, CKMB, CKMBINDEX, TROPONINI in the last 168 hours. BNP (last 3 results) No results for input(s): PROBNP in the last 8760 hours. HbA1C: No results for input(s): HGBA1C in the last 72 hours. CBG:  Recent Labs Lab 12/19/15 1047 12/19/15 1546 12/19/15 2056 12/20/15 0730 12/20/15 1113  GLUCAP 125* 88 99 136* 95   Lipid Profile: No results for input(s): CHOL, HDL, LDLCALC, TRIG, CHOLHDL, LDLDIRECT in the last 72 hours. Thyroid Function Tests: No results for input(s): TSH, T4TOTAL, FREET4, T3FREE, THYROIDAB in the last 72 hours. Anemia Panel: No results for input(s): VITAMINB12, FOLATE, FERRITIN, TIBC, IRON, RETICCTPCT in the last 72 hours. Sepsis Labs: No results for input(s): PROCALCITON, LATICACIDVEN in the last 168 hours.  No results found for this or any previous visit (from the past 240 hour(s)).       Radiology Studies: Abd 1 View (kub)  Result Date: 12/19/2015 CLINICAL DATA:  Foreign body ingestion. EXAM: ABDOMEN - 1 VIEW COMPARISON:  12/17/2015 FINDINGS: Seven batteries are present in the abdomen. One battery in the cecum. The remainder in the hepatic flexure and transverse colon. These have progressed since the prior study. No bowel obstruction. IMPRESSION: 7 batteries are present in the colon. Electronically Signed   By: Marlan Palau M.D.   On: 12/19/2015 07:20        Scheduled Meds: . amantadine  100 mg Oral TID WC  . bisacodyl  5 mg Oral Daily  . doxepin  25 mg Oral QHS  . enoxaparin (LOVENOX) injection  40 mg Subcutaneous Q24H  . FLUoxetine  20 mg Oral Daily  . insulin aspart  0-9 Units Subcutaneous TID WC  . levothyroxine  100  mcg Oral QAC breakfast  . lithium carbonate  450 mg Oral Daily  . lithium carbonate  900 mg Oral QHS  . OLANZapine  10 mg Oral QHS  . OLANZapine  5 mg Oral 2 times per day   Continuous Infusions:    LOS: 1 day     Jacquelin Hawking, MD [facility] Pager [phone]  If 7PM-7AM, please contact night-coverage [email or web address] Password [email or web address] 12/20/2015, 3:56 PM

## 2015-12-20 NOTE — Progress Notes (Signed)
Notified physician patient has passed 2 batteries in stools today.  Batteries intact.

## 2015-12-21 ENCOUNTER — Inpatient Hospital Stay (HOSPITAL_COMMUNITY): Payer: Medicaid Other

## 2015-12-21 LAB — GLUCOSE, CAPILLARY
Glucose-Capillary: 103 mg/dL — ABNORMAL HIGH (ref 65–99)
Glucose-Capillary: 129 mg/dL — ABNORMAL HIGH (ref 65–99)
Glucose-Capillary: 119 mg/dL — ABNORMAL HIGH (ref 65–99)
Glucose-Capillary: 112 mg/dL — ABNORMAL HIGH (ref 65–99)

## 2015-12-21 NOTE — Progress Notes (Addendum)
PROGRESS NOTE    Holly [name]  VHQ:[id number] DOB: 1995/06/26 DOA: 12/17/2015 PCP: Geraldo Pitter, MD   Brief Narrative: She's a 20 year old female with history of past suicidal attempt and depression with an additional diagnosis of schizoaffective disorder, bipolar type and she presents to the emergency department after ingesting 6-8 AA batteries as a suicidal gesture. Gastroenterology was consult to and has decided to watch at this time. Initial KUB is significant for 6 batteries with 4 in the intestine and 2 in the stomach. Repeat KUB shows movement of batteries with a count of 7.   Assessment & Plan:   Active Problems:   Schizoaffective disorder, bipolar type (HCC)   Suicidal ideation   Ingestion of foreign body   Depression   Ingestion of foreign body Patient had a bowel movement yesterday and past 3 batteries. Abdominal x-ray this morning shows 3 still in the abdomen. Somewhat confusing, as previous x-ray 7 batteries and patient states she only had 3 expelled since she has been here.  Leukocytosis resolved. -Awaiting passage via bowel movements -KUB after patient excretes remaining batteries to confirm no batteries present -Gastroenterology, Dr. Elnoria Howard following  Suicide attempt/history of suicide attempt/depression/Schizoaffective disorder, bipolar Patient qualifies for inpatient psych. Patient will be transferred to behavioral health once medically stable. Lithium level low -suicide precautions -zyprexa 5 mg in the morning and 10 mg at night -Discontinue doxepin, benztropine -Continue amantadine, lithium -Appreciate psychiatry recommendations  Hypothyroidism Last TSH of 0.771 in April 2017.  -continue synthroid daily  Allergies Continue flonase  ?DM2 Last A1C of 5.6. Currently on Metformin, however, this could be secondary to Zyprexa usage. Patient is unaware of diagnosis of diabetes. - hold metformin - SSI sensitive  Morbid obesity BMI at least  40 - outpatient management  DVT prophylaxis: Lovenox  Code Status: Full code  Family Communication: None at bedside Disposition Plan: Plan to discharge to inpatient behavioral health likely tomorrow when medically stable   Consultants:   Gastroenterology, Dr. Elnoria Howard  Psychiatry  Procedures:  None  Antimicrobials:   None    Subjective: Patient reports no abdominal pain. No bowel movement this morning. No nausea.  Objective: Vitals:   12/20/15 0348 12/20/15 1351 12/20/15 2104 12/21/15 0648  BP: 106/71 110/75 (!) 95/56 109/69  Pulse: 80 (!) 105 91 80  Resp: 20 20 18 18   Temp: 98.6 F (37 C) 98 F (36.7 C) 98.1 F (36.7 C) 98.5 F (36.9 C)  TempSrc: Oral Oral Axillary Oral  SpO2: 99% 99% 100% 100%  Weight:      Height:        Intake/Output Summary (Last 24 hours) at 12/21/15 0958 Last data filed at 12/21/15 0918  Gross per 24 hour  Intake             3000 ml  Output                0 ml  Net             3000 ml   Filed Weights   12/17/15 1113 12/17/15 1656  Weight: 99.3 kg (219 lb) 100.5 kg (221 lb 9 oz)    Examination:  General exam: Appears calm and comfortable  Respiratory system: Clear to auscultation. Respiratory effort normal. Cardiovascular system: S1 & S2 heard, RRR. No  murmurs, rubs, gallops or clicks. No pedal edema. Gastrointestinal system: Abdomen is nondistended, soft and nontender. No organomegaly or masses felt. Normal bowel sounds heard. Central nervous system: Alert and oriented. No focal  neurological deficits. Extremities: Symmetric 5 x 5 power. Skin: No rashes, lesions or ulcers Psychiatry: Patient endorses suicidal ideation with no current plan. Affect is full. Mood appears normal.    Data Reviewed: I have personally reviewed following labs and imaging studies  CBC:  Recent Labs Lab 12/17/15 1124 12/18/15 0544 12/19/15 0852  WBC 5.8 12.6* 9.5  HGB 13.0 12.1 12.1  HCT 39.8 36.5 36.1  MCV 95.4 94.8 95.5  PLT 333 306 306    Basic Metabolic Panel:  Recent Labs Lab 12/17/15 1124 12/18/15 0544 12/19/15 0852  NA 141 140 138  K 3.7 3.8 3.6  CL 110 107 108  CO2 24 26 21*  GLUCOSE 100* 128* 141*  BUN 8 10 8   CREATININE 0.63 0.67 0.59  CALCIUM 9.7 9.1 8.8*   GFR: Estimated Creatinine Clearance: 117.5 mL/min (by C-G formula based on SCr of 0.8 mg/dL). Liver Function Tests:  Recent Labs Lab 12/17/15 1124 12/18/15 0544  AST 21 19  ALT 10* 10*  ALKPHOS 137* 113  BILITOT 0.3 0.4  PROT 7.6 6.8  ALBUMIN 4.0 3.5   No results for input(s): LIPASE, AMYLASE in the last 168 hours. No results for input(s): AMMONIA in the last 168 hours. Coagulation Profile: No results for input(s): INR, PROTIME in the last 168 hours. Cardiac Enzymes: No results for input(s): CKTOTAL, CKMB, CKMBINDEX, TROPONINI in the last 168 hours. BNP (last 3 results) No results for input(s): PROBNP in the last 8760 hours. HbA1C: No results for input(s): HGBA1C in the last 72 hours. CBG:  Recent Labs Lab 12/20/15 0730 12/20/15 1113 12/20/15 1622 12/20/15 2115 12/21/15 0719  GLUCAP 136* 95 107* 100* 103*   Lipid Profile: No results for input(s): CHOL, HDL, LDLCALC, TRIG, CHOLHDL, LDLDIRECT in the last 72 hours. Thyroid Function Tests: No results for input(s): TSH, T4TOTAL, FREET4, T3FREE, THYROIDAB in the last 72 hours. Anemia Panel: No results for input(s): VITAMINB12, FOLATE, FERRITIN, TIBC, IRON, RETICCTPCT in the last 72 hours. Sepsis Labs: No results for input(s): PROCALCITON, LATICACIDVEN in the last 168 hours.  No results found for this or any previous visit (from the past 240 hour(s)).       Radiology Studies: Dg Abd 1 View  Result Date: 12/21/2015 CLINICAL DATA:  Foreign body ingestion EXAM: ABDOMEN - 1 VIEW COMPARISON:  KUB of December 19, 2015 FINDINGS: The colonic stool burden remains increased. 3 radiodense AAA battery-like foreign bodies remain. One lies in the mid descending colon and two overlie the  rectosigmoid. IMPRESSION: Interval passage of 4 of the 7 radiodense foreign bodies. Persistently increased colonic stool burden. Electronically Signed   By: David  [place] M.D.   On: 12/21/2015 08:18        Scheduled Meds: . amantadine  100 mg Oral TID WC  . bisacodyl  5 mg Oral Daily  . doxepin  25 mg Oral QHS  . enoxaparin (LOVENOX) injection  40 mg Subcutaneous Q24H  . FLUoxetine  20 mg Oral Daily  . insulin aspart  0-9 Units Subcutaneous TID WC  . levothyroxine  100 mcg Oral QAC breakfast  . lithium carbonate  450 mg Oral Daily  . lithium carbonate  900 mg Oral QHS  . OLANZapine  10 mg Oral QHS  . OLANZapine  5 mg Oral 2 times per day   Continuous Infusions:    LOS: 2 days     Jacquelin Hawking, MD [facility] Pager [phone]  If 7PM-7AM, please contact night-coverage [email or web address] Password [facility] 12/21/2015, 9:58 AM

## 2015-12-21 NOTE — Progress Notes (Signed)
Patient passed 2 more batteries in stool. Batteries intact.

## 2015-12-21 NOTE — Progress Notes (Signed)
Patient passed 1 more battery stool. Battery intact

## 2015-12-22 ENCOUNTER — Inpatient Hospital Stay (HOSPITAL_COMMUNITY): Payer: Medicaid Other

## 2015-12-22 DIAGNOSIS — F250 Schizoaffective disorder, bipolar type: Secondary | ICD-10-CM

## 2015-12-22 DIAGNOSIS — T189XXA Foreign body of alimentary tract, part unspecified, initial encounter: Secondary | ICD-10-CM

## 2015-12-22 DIAGNOSIS — F329 Major depressive disorder, single episode, unspecified: Secondary | ICD-10-CM

## 2015-12-22 LAB — GLUCOSE, CAPILLARY
Glucose-Capillary: 120 mg/dL — ABNORMAL HIGH (ref 65–99)
Glucose-Capillary: 113 mg/dL — ABNORMAL HIGH (ref 65–99)
Glucose-Capillary: 103 mg/dL — ABNORMAL HIGH (ref 65–99)

## 2015-12-22 MED ORDER — DOXEPIN HCL 25 MG PO CAPS: 25.0000 mg | ORAL_CAPSULE | Freq: Every day | ORAL | 0 refills | Status: DC

## 2015-12-22 MED ORDER — DOXEPIN HCL 25 MG PO CAPS: 25.0000 mg | ORAL_CAPSULE | Freq: Every day | ORAL | Status: DC

## 2015-12-22 MED ORDER — BISACODYL 5 MG PO TBEC: 5.0000 mg | DELAYED_RELEASE_TABLET | Freq: Every day | ORAL | 0 refills | Status: DC

## 2015-12-22 NOTE — Discharge Instructions (Signed)
[name], we watched you in the hospital to make sure you safely passed the batteries you ingested. We initially were considering inpatient psychiatry but feel you will do well in the outpatient setting.

## 2015-12-22 NOTE — Discharge Summary (Addendum)
Physician Discharge Summary  Holly [name]:[id number] DOB: 1996/05/06 DOA: 12/17/2015  PCP: Holly Pitter, MD  Admit date: 12/17/2015 Discharge date: 12/22/2015  Admitted From: Home Disposition:  Behavioral health hospital  Recommendations for Outpatient Follow-up:  1. Follow up with PCP in 1-2 weeks 2. Can try Miralax daily if no routine bowel movements  Home Health: No Equipment/Devices: None  Discharge Condition: Stable CODE STATUS: Full Code Diet recommendation: Regular  Brief/Interim Summary: Holly [name] is a 20yo female with a history of past suicidal attempt and depression with an additional diagnosis of schizoaffective disorder, bipolar type and she presents to the emergency department after ingesting 6-8 AA batteries as a suicidal gesture. Gastroenterology was consult to and has decided to watch at this time. Initial KUB is significant for 6 batteries with 4 in the intestine and 2 in the stomach. Repeat KUB shows movement of batteries with a count of 7.  Patient passed 6 total batteries. KUB shows no more batteries located in the colon. Patient with no abdominal complaints. Last KUB significant for some stool burden. Hospital course was unremarkable.  Discharge Diagnoses:  Active Problems:   Schizoaffective disorder, bipolar type (HCC)   Suicidal ideation   Ingestion of foreign body   Depression    Discharge Instructions  Discharge Instructions    Diet - low sodium heart healthy    Complete by:  As directed   Increase activity slowly    Complete by:  As directed       Medication List    STOP taking these medications   benztropine 1 MG tablet Commonly known as:  COGENTIN   hydrOXYzine 25 MG tablet Commonly known as:  ATARAX/VISTARIL     TAKE these medications   amantadine 100 MG capsule Commonly known as:  SYMMETREL Take 100 mg by mouth 3 (three) times daily. Takes 1 in the morning, 1 at noon, and 1 at 1700   bifidobacterium infantis  capsule Take 1 capsule by mouth daily.   bisacodyl 5 MG EC tablet Commonly known as:  DULCOLAX Take 1 tablet (5 mg total) by mouth daily.   doxepin 25 MG capsule Commonly known as:  SINEQUAN Take 1 capsule (25 mg total) by mouth at bedtime. What changed:  medication strength  how much to take   FLUoxetine 20 MG capsule Commonly known as:  PROZAC Take 1 capsule (20 mg total) by mouth daily. For depression   fluticasone 50 MCG/ACT nasal spray Commonly known as:  FLONASE Place 1 spray into both nostrils daily as needed for allergies.   levothyroxine 100 MCG tablet Commonly known as:  SYNTHROID, LEVOTHROID Take 1 tablet (100 mcg total) by mouth daily before breakfast. For low thyroid function   LINZESS 290 MCG Caps capsule Generic drug:  linaclotide Take 290 mcg by mouth daily before breakfast.   lithium carbonate 450 MG CR tablet Commonly known as:  ESKALITH Take 1 tablet (450 mg total) by mouth 2 (two) times daily after a meal. For mood stabilization What changed:  how much to take  additional instructions   medroxyPROGESTERone 150 MG/ML injection Commonly known as:  DEPO-PROVERA Inject 150 mg into the muscle every 3 (three) months.   metFORMIN 500 MG tablet Commonly known as:  GLUCOPHAGE Take 1 tablet (500 mg) at breakfast & 2 tablets (1000 mg) at supper: For diabetes What changed:  how much to take  how to take this  when to take this  additional instructions   OLANZapine 5 MG tablet Commonly  known as:  ZYPREXA Take 1 tablet (5 mg in the morning & 2 tablets (10 mg at bedtime: For mood control What changed:  how much to take  how to take this  when to take this  additional instructions   omeprazole 20 MG capsule Commonly known as:  PRILOSEC Take 20 mg by mouth daily with breakfast.   polycarbophil 625 MG tablet Commonly known as:  FIBERCON Take 625 mg by mouth daily with breakfast.       Allergies  Allergen Reactions  . Gluten Meal  Other (See Comments)    Upset stomach - patient denies  . Lactose Intolerance (Gi) Nausea Only and Other (See Comments)    Upset stomach    Consultations:  Gastroenterology   Procedures/Studies: Dg Abd 1 View  Result Date: 12/22/2015 CLINICAL DATA:  Foreign body ingestion EXAM: ABDOMEN - 1 VIEW COMPARISON:  Pain 14 2017 FINDINGS: Previously seen radiopaque foreign bodies are no longer visualized. Moderate stool burden in the colon. No evidence of bowel obstruction or free air. No organomegaly or suspicious calcification. IMPRESSION: Interval passage of the previously seen foreign bodies. Moderate stool burden. Electronically Signed   By: Holly [name] M.D.   On: 12/22/2015 09:21   Dg Abd 1 View  Result Date: 12/21/2015 CLINICAL DATA:  Foreign body ingestion EXAM: ABDOMEN - 1 VIEW COMPARISON:  KUB of December 19, 2015 FINDINGS: The colonic stool burden remains increased. 3 radiodense AAA battery-like foreign bodies remain. One lies in the mid descending colon and two overlie the rectosigmoid. IMPRESSION: Interval passage of 4 of the 7 radiodense foreign bodies. Persistently increased colonic stool burden. Electronically Signed   By: Holly  [name] M.D.   On: 12/21/2015 08:18   Abd 1 View (kub)  Result Date: 12/19/2015 CLINICAL DATA:  Foreign body ingestion. EXAM: ABDOMEN - 1 VIEW COMPARISON:  12/17/2015 FINDINGS: Seven batteries are present in the abdomen. One battery in the cecum. The remainder in the hepatic flexure and transverse colon. These have progressed since the prior study. No bowel obstruction. IMPRESSION: 7 batteries are present in the colon. Electronically Signed   By: Holly [name] M.D.   On: 12/19/2015 07:20   Dg Abdomen 1 View  Result Date: 12/17/2015 CLINICAL DATA:  The patient reports swallowing 6 batteries yesterday. EXAM: ABDOMEN - 1 VIEW COMPARISON:  None. FINDINGS: Two radiopaque foreign bodies consistent with batteries are seen in the left upper quadrant the abdomen and  likely in the stomach. Four additional batteries are seen clustered in the left lower quadrant of the abdomen and could be in small or large bowel. The bowel gas pattern is nonobstructive. IMPRESSION: Six batteries are identified with two in the stomach and four in small bowel or colon as above. Electronically Signed   By: Drusilla Kanner M.D.   On: 12/17/2015 12:25      Subjective: Patient feels great today. No abdominal pain.  Discharge Exam: Vitals:   12/21/15 2034 12/22/15 0624  BP: 104/61 114/82  Pulse: 94 82  Resp: 17 16  Temp: 98.4 F (36.9 C) 98 F (36.7 C)   Vitals:   12/21/15 0648 12/21/15 1500 12/21/15 2034 12/22/15 0624  BP: 109/69 (!) 104/59 104/61 114/82  Pulse: 80 93 94 82  Resp: 18 18 17 16   Temp: 98.5 F (36.9 C) 98.3 F (36.8 C) 98.4 F (36.9 C) 98 F (36.7 C)  TempSrc: Oral Oral Oral Oral  SpO2: 100% 100% 99% 99%  Weight:      Height:  General: Pt is alert, awake, not in acute distress Cardiovascular: RRR, S1/S2 +, no rubs, no gallops Respiratory: CTA bilaterally, no wheezing, no rhonchi Abdominal: Soft, NT, ND, bowel sounds + Extremities: no edema, no cyanosis Psych: Suicidal ideation    The results of significant diagnostics from this hospitalization (including imaging, microbiology, ancillary and laboratory) are listed below for reference.     Microbiology: No results found for this or any previous visit (from the past 240 hour(s)).   Labs: BNP (last 3 results) No results for input(s): BNP in the last 8760 hours. Basic Metabolic Panel:  Recent Labs Lab 12/17/15 1124 12/18/15 0544 12/19/15 0852  NA 141 140 138  K 3.7 3.8 3.6  CL 110 107 108  CO2 24 26 21*  GLUCOSE 100* 128* 141*  BUN 8 10 8   CREATININE 0.63 0.67 0.59  CALCIUM 9.7 9.1 8.8*   Liver Function Tests:  Recent Labs Lab 12/17/15 1124 12/18/15 0544  AST 21 19  ALT 10* 10*  ALKPHOS 137* 113  BILITOT 0.3 0.4  PROT 7.6 6.8  ALBUMIN 4.0 3.5   No results  for input(s): LIPASE, AMYLASE in the last 168 hours. No results for input(s): AMMONIA in the last 168 hours. CBC:  Recent Labs Lab 12/17/15 1124 12/18/15 0544 12/19/15 0852  WBC 5.8 12.6* 9.5  HGB 13.0 12.1 12.1  HCT 39.8 36.5 36.1  MCV 95.4 94.8 95.5  PLT 333 306 306   Cardiac Enzymes: No results for input(s): CKTOTAL, CKMB, CKMBINDEX, TROPONINI in the last 168 hours. BNP: Invalid input(s): POCBNP CBG:  Recent Labs Lab 12/21/15 1015 12/21/15 1455 12/21/15 2058 12/22/15 0648 12/22/15 1058  GLUCAP 129* 119* 112* 120* 113*   D-Dimer No results for input(s): DDIMER in the last 72 hours. Hgb A1c No results for input(s): HGBA1C in the last 72 hours. Lipid Profile No results for input(s): CHOL, HDL, LDLCALC, TRIG, CHOLHDL, LDLDIRECT in the last 72 hours. Thyroid function studies No results for input(s): TSH, T4TOTAL, T3FREE, THYROIDAB in the last 72 hours.  Invalid input(s): FREET3 Anemia work up No results for input(s): VITAMINB12, FOLATE, FERRITIN, TIBC, IRON, RETICCTPCT in the last 72 hours. Urinalysis    Component Value Date/Time   COLORURINE YELLOW 08/18/2015 1652   APPEARANCEUR CLOUDY (A) 08/18/2015 1652   LABSPEC 1.009 08/18/2015 1652   PHURINE 6.5 08/18/2015 1652   GLUCOSEU NEGATIVE 08/18/2015 1652   HGBUR SMALL (A) 08/18/2015 1652   BILIRUBINUR NEGATIVE 08/18/2015 1652   KETONESUR NEGATIVE 08/18/2015 1652   PROTEINUR NEGATIVE 08/18/2015 1652   UROBILINOGEN 0.2 02/21/2015 1830   NITRITE NEGATIVE 08/18/2015 1652   LEUKOCYTESUR MODERATE (A) 08/18/2015 1652   Sepsis Labs Invalid input(s): PROCALCITONIN,  WBC,  LACTICIDVEN Microbiology No results found for this or any previous visit (from the past 240 hour(s)).   Time coordinating discharge: Over 30 minutes  SIGNED:   Jacquelin Hawking, MD  [facility] 12/22/2015, 12:33 PM Pager [phone]  If 7PM-7AM, please contact night-coverage [email or web address] Password [email or web address]

## 2015-12-22 NOTE — Consult Note (Signed)
[facility]   Reason for Consult: Suicidal Ideations/Behaviors  Referring Physician:  Dr. Caleb PoppNettey  Patient Identification: Holly [name] Andujo MRN:  [id number] Principal Diagnosis: Ingestion of foreign body  Diagnosis:   Patient Active Problem List   Diagnosis Date Noted  . Depression [F32.9]   . Ingestion of foreign body [T18.9XXA] 12/17/2015  . Hallucinations [R44.3]   . Suicidal ideation [R45.851]   . Injury, self-inflicted [F48.9]   . Schizoaffective disorder, bipolar type (HCC) [F25.0]   . 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 Psychosis [F29] 03/18/2014  . ADHD (attention deficit hyperactivity disorder) [F90.9] 01/03/2013  . PTSD (post-traumatic stress disorder) [F43.10] 01/03/2013  . Agitation [R45.1] 01/03/2013    Total Time spent with patient: 30 minutes  Subjective:   Holly [name] Evetts is a 20 y.o. female patient admitted with ingestion of foreign body.  HPI:  Patient is a 20 year old single female, no children, on disability,lives with a caregiver. She has a history of mental illness and has been diagnosed with Schizoaffective Disorder. She has a history of several past psychiatric admissions , most recently in April 2017.Patient states she recently felt worse, acutely depressed ( at this time denies any triggering event, chart notes indicate she had a verbal altercation )  and initially tried to tie a shoestring around her neck. Later she ingested 6 (?)  batteries ( from her music  Player device  )within a period of two days. States she did think this was going to cause her death and it was a suicide attempt. She has been evaluated by GI specialist, and at this time foreign objects are expected to pass through without need for surgery. Patient has visible scratches on forearm and states she was scratching when feeling upset . She states she has been compliant with her psychiatric medications and denies any drug or alcohol abuse. Today reports feeling  "OK".   Past Psychiatric History:  Several prior psychiatric admissions since adolescence, has been diagnosed with Schizoaffective  Disorder, PTSD.    Interval history: patient seen with LCSW for psychiatric consultation follow up. Patient appeared sitting on her bed and eating her meal without distress. She states that she is feeling fine and has no disturbance of sleep and appetite. She has been under [employer] custody since age 20 years old and being placed in [facility] years and [facility]. She is know to have mild intellectual deficits. She is known for acting out with behaviors instead of talking out to solve her problems. She is worried that she may not able to go back to [facility] due to recent behavioral problems. She wish to go back to her mother's care but her mother can not care for her due to chronic and on going medical condition and she reports visiting every three months but does has tantrums like a younger child. She is wishing to go back to [facility] if accepted. She contract for safety during my evaluation but difficult to be assured due to in consistent answers and recurrent and repeated ongoing behaviors. She does not benefit in acute psych admission as she has difficult to process the therapies.   Risk to Self: Is patient at risk for suicide?: Yes Risk to Others:  currently denies  Prior Inpatient Therapy:  yes  Prior Outpatient Therapy:  yes   Past Medical History:  Past Medical History:  Diagnosis Date  . ADHD (attention deficit hyperactivity disorder)   .  Bipolar 1 disorder (HCC)   . Depression   . Diabetes mellitus   . Gluten free diet   . Hypothyroidism   . Mood disorder (HCC)   . 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  . Obesity   . PTSD (post-traumatic stress disorder)     Past Surgical History:  Procedure Laterality Date  . NO PAST SURGERIES    . None     Family History:  Patient has reported limited knowledge of biological family's  psychiatric history Family History  Problem Relation Age of Onset  . Adopted: Yes   Family Psychiatric  History: as above  Social History:  Single, no children, living with a caregiver, on disability History  Alcohol Use No     History  Drug Use No    Social History   Social History  . Marital status: Single    Spouse name: [name]/A  . Number of children: [name]/A  . Years of education: [name]/A   Social History Main Topics  . Smoking status: Never Smoker  . Smokeless tobacco: Never Used  . Alcohol use No  . Drug use: No  . Sexual activity: No   Other Topics Concern  . None   Social History Narrative  . None   Additional Social History:    Allergies:   Allergies  Allergen Reactions  . Gluten Meal Other (See Comments)    Upset stomach - patient denies  . Lactose Intolerance (Gi) Nausea Only and Other (See Comments)    Upset stomach    Labs:  Results for orders placed or performed during the hospital encounter of 12/17/15 (from the past 48 hour(s))  Lithium level     Status: Abnormal   Collection Time: 12/20/15  1:30 PM  Result Value Ref Range   Lithium Lvl 0.53 (L) 0.60 - 1.20 mmol/L  Glucose, capillary     Status: Abnormal   Collection Time: 12/20/15  4:22 PM  Result Value Ref Range   Glucose-Capillary 107 (H) 65 - 99 mg/dL  Glucose, capillary     Status: Abnormal   Collection Time: 12/20/15  9:15 PM  Result Value Ref Range   Glucose-Capillary 100 (H) 65 - 99 mg/dL  Glucose, capillary     Status: Abnormal   Collection Time: 12/21/15  7:19 AM  Result Value Ref Range   Glucose-Capillary 103 (H) 65 - 99 mg/dL  Glucose, capillary     Status: Abnormal   Collection Time: 12/21/15 10:15 AM  Result Value Ref Range   Glucose-Capillary 129 (H) 65 - 99 mg/dL  Glucose, capillary     Status: Abnormal   Collection Time: 12/21/15  2:55 PM  Result Value Ref Range   Glucose-Capillary 119 (H) 65 - 99 mg/dL  Glucose, capillary     Status: Abnormal   Collection Time: 12/21/15   8:58 PM  Result Value Ref Range   Glucose-Capillary 112 (H) 65 - 99 mg/dL  Glucose, capillary     Status: Abnormal   Collection Time: 12/22/15  6:48 AM  Result Value Ref Range   Glucose-Capillary 120 (H) 65 - 99 mg/dL  Glucose, capillary     Status: Abnormal   Collection Time: 12/22/15 10:58 AM  Result Value Ref Range   Glucose-Capillary 113 (H) 65 - 99 mg/dL   Comment 1 Notify RN     Current Facility-Administered Medications  Medication Dose Route Frequency Provider Last Rate Last Dose  . acetaminophen (TYLENOL) tablet 650 mg  650 mg Oral Q6H PRN Clydie BraunKaren  Grayling Congress, NP   650 mg at 12/19/15 0208  . alum & mag hydroxide-simeth (MAALOX/MYLANTA) 200-200-20 MG/5ML suspension 30 mL  30 mL Oral Q6H PRN Narda Bonds, MD   30 mL at 12/19/15 0208  . amantadine (SYMMETREL) capsule 100 mg  100 mg Oral TID WC Narda Bonds, MD   100 mg at 12/22/15 1200  . bisacodyl (DULCOLAX) EC tablet 5 mg  5 mg Oral Daily Narda Bonds, MD   5 mg at 12/22/15 1120  . doxepin (SINEQUAN) capsule 25 mg  25 mg Oral QHS Narda Bonds, MD   25 mg at 12/21/15 2155  . enoxaparin (LOVENOX) injection 40 mg  40 mg Subcutaneous Q24H Narda Bonds, MD   40 mg at 12/21/15 2154  . FLUoxetine (PROZAC) capsule 20 mg  20 mg Oral Daily Narda Bonds, MD   20 mg at 12/22/15 1120  . fluticasone (FLONASE) 50 MCG/ACT nasal spray 1 spray  1 spray Each Nare Daily PRN Narda Bonds, MD      . insulin aspart (novoLOG) injection 0-9 Units  0-9 Units Subcutaneous TID WC Narda Bonds, MD   1 Units at 12/21/15 1207  . levothyroxine (SYNTHROID, LEVOTHROID) tablet 100 mcg  100 mcg Oral QAC breakfast Narda Bonds, MD   100 mcg at 12/22/15 0901  . lithium carbonate (ESKALITH) CR tablet 450 mg  450 mg Oral Daily Narda Bonds, MD   450 mg at 12/22/15 1120  . lithium carbonate (ESKALITH) CR tablet 900 mg  900 mg Oral QHS Narda Bonds, MD   900 mg at 12/21/15 2155  . morphine 2 MG/ML injection 1 mg  1 mg Intravenous Q3H PRN Leda Gauze, NP   1 mg at 12/18/15 0407  . OLANZapine (ZYPREXA) tablet 10 mg  10 mg Oral QHS Narda Bonds, MD   10 mg at 12/21/15 2155  . OLANZapine (ZYPREXA) tablet 5 mg  5 mg Oral 2 times per day Narda Bonds, MD   5 mg at 12/22/15 0901    Musculoskeletal: Strength & Muscle Tone: within normal limits Gait & Station: normal- gait not examined  Patient leans: [name]/A  Psychiatric Specialty Exam: Physical Exam  ROS denies headache or vomiting   Blood pressure 114/82, pulse 82, temperature 98 F (36.7 C), temperature source Oral, resp. rate 16, height 5' (1.524 m), weight 100.5 kg (221 lb 9 oz), SpO2 99 %.Body mass index is 43.27 kg/m.  General Appearance: Fairly Groomed  Eye Contact:  Good  Speech:  Normal Rate  Volume:  Normal  Mood:  states today feeling better   Affect:  mildly constricted but reactive   Thought Process:  Linear  Orientation:  Other:  fully alert and attentive   Thought Content:  at present denies hallucinations and does not appear internally preoccupied   Suicidal Thoughts:  Yes.  without intent/plan denies current plan or intentions .   Homicidal Thoughts:  No  Memory:  recent and remote grossly intact   Judgement:  Impaired  Insight:  Fair  Psychomotor Activity:  Normal  Concentration:  Concentration: Good and Attention Span: Good  Recall:  Good  Fund of Knowledge:  Good  Language:  Good  Akathisia:  Negative  Handed:  Right  AIMS (if indicated):     Assets:  Desire for Improvement Resilience  ADL's:  Intact  Cognition:  WNL  Sleep:        Treatment Plan Summary: Patient  has been doing fine with her current medication regimen and tolerating well. She has passed her stool along with foreign bodies she swallowed few days ago. Patient has been suffering with chronic and recurrent behavioral and emotional problems. She has been acting out when she does not meet her needs and reportedly has mild intellectual deficits and never able to complete high  school or FED. She is under custody of DSS since age 21 years old.   She does not meets criteria for inpatient admission to psychiatric unit as this is a chronic behavioral problem and she does not communicate verbally and does not process well her emotions due to chronic cognitive deficits and intellectual deficits. she has requested to sent to [facility] and [facility] custody but concern about if they are going to accept or not after what she has done.   Will ask LCSW to contact her DSS guardian and also [facility] parent for disposition plans. If she is not allowed back [facility], she may need new placement.    Medication treatment: Continue Zyprexa to 5 mgrs QAM and 10 mgrs QHS  Continue Prozac 20 mgrs QDAY  Continue Lithium 450 mgrs QAM and 900 mgrs QHS, Lithium serum level is 0.53 as per 12/20/2015 which is below the  therapeutic level and may recheck again in a week and than adjust dosage as clinically required and to prevent impulsive behaviors. Continue Doxepine 25 mgrs QHS for insomnia  Appreciate psychiatric consultation and we sign off as of today Please contact [phone] or [phone] if needs further assistance   Disposition: Patient will be referred to out patient medication management at [facility] Patient does not meet criteria for psychiatric inpatient admission. Supportive therapy provided about ongoing stressors.  Leata Mouse, MD 12/22/2015 11:27 AM

## 2015-12-22 NOTE — Progress Notes (Signed)
Date: December 22, 2015 No needs present at time of discharge. Heyward Douthit, RN, BSN, CCM   [phone] 

## 2015-12-22 NOTE — Clinical Social Work Psych Note (Addendum)
Clinical Social Worker Psych Service Line Progress Note  Clinical Social Worker: Lia Hopping, LCSW Date/Time: 12/22/2015, 4:33 PM   Review of Patient  Overall Medical Condition:  Medically cleared. Patient CT was negative for batteries.   Participation Level:  Active Participation Quality: Appropriate Other Participation Quality:  Good. Alert and oriented.   Affect: Appropriate Cognitive: Alert Reaction to Medications/Concerns:  Patient reports her medication makes her feel drowsy at times.   Modes of Intervention: Support   Summary of Progress/Plan at Discharge  Summary of Progress/Plan at [place], Psychiatist MD met with patient at bedside, patient agreeable to consult. Patient reports she is feeling better today. Patient reports she is worried about not being able to return to group home. Patient was receptive to [place] her group home caretaker. Patient denied SI/HI contract for safety but difficult to be assured due to in consistent answers and recurrent and repeated ongoing behaviors.  Disposition: Patient will be referred to out patient medication management at [facility] Patient does not meet criteria for psychiatric inpatient admission. Supportive therapy provided about ongoing stressors.  LCSWA contacted patient caretaker Helene Kelp: [phone] She has agreed to pick patient up between 5:00pm-6:00pm.

## 2017-06-19 IMAGING — DX DG ABDOMEN 1V
1 series · 1 of 1 positions shown · non-contrast
Comparison: KUB December 19, 2015

CLINICAL DATA: Foreign body ingestion

EXAM:
ABDOMEN - 1 VIEW

[abdomen kub]
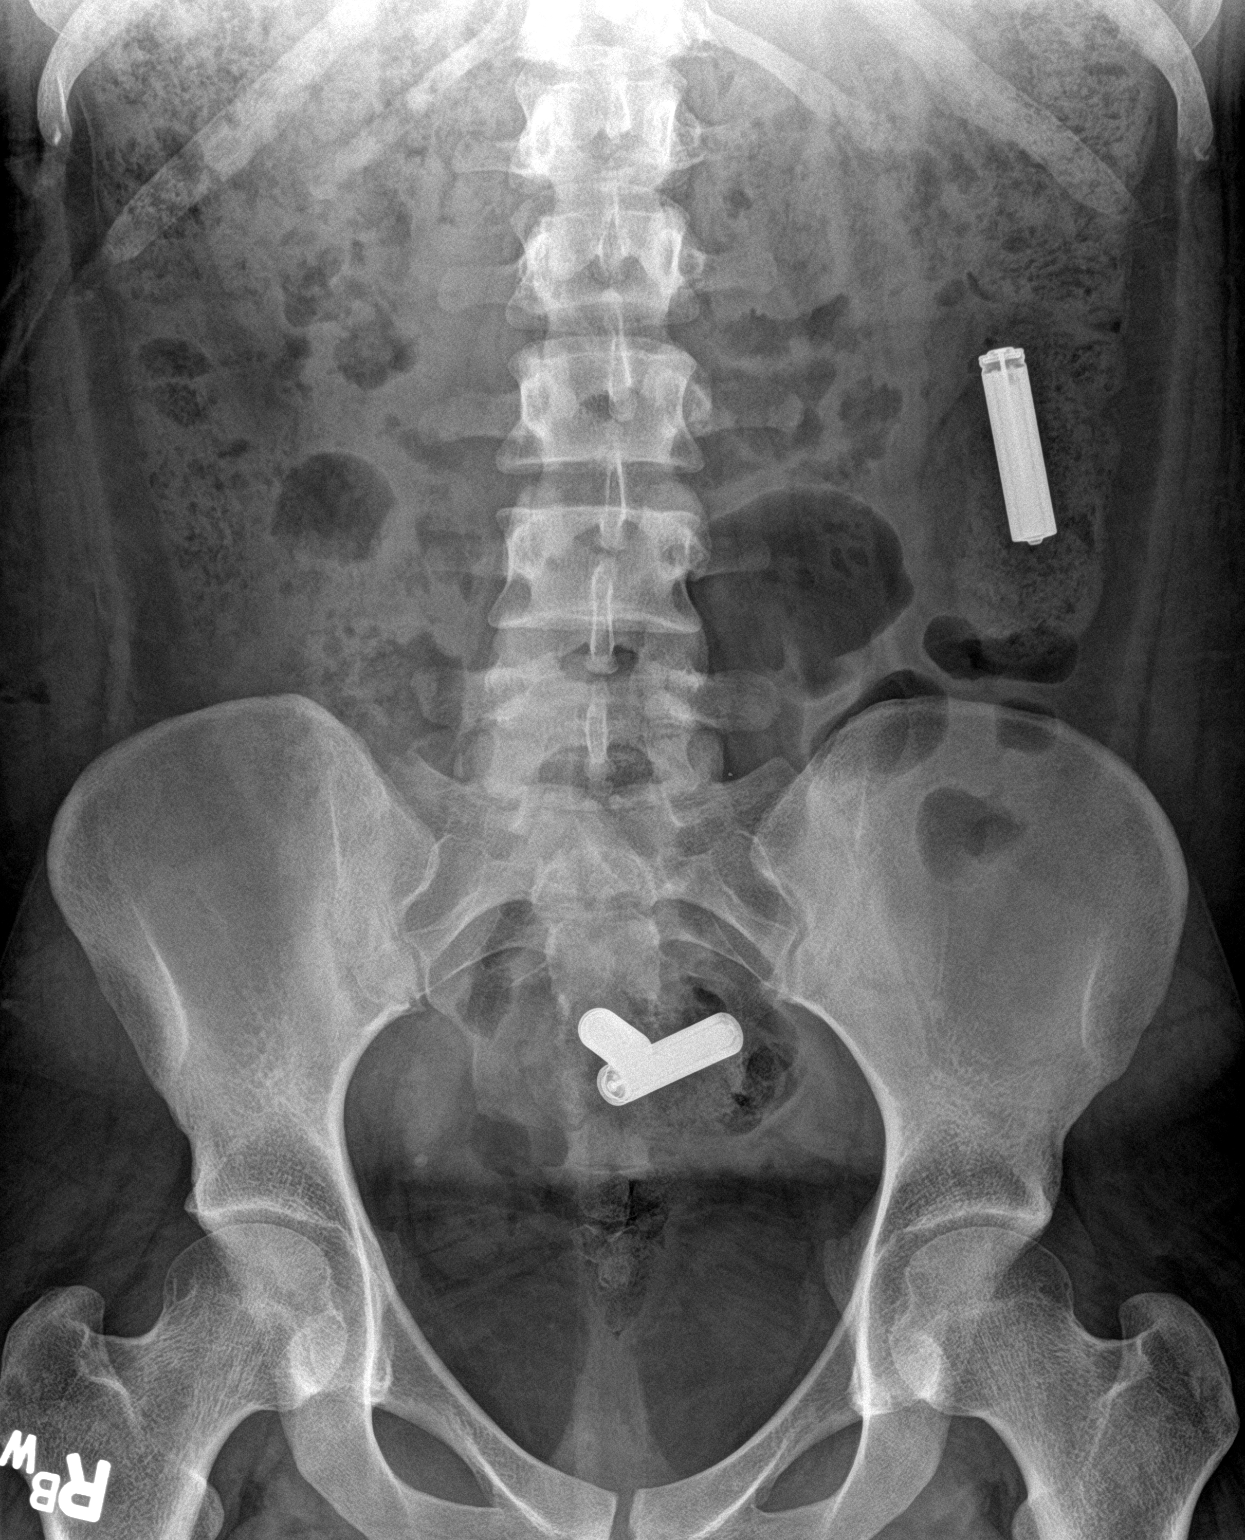

[1 of 1 positions shown; findings below may reference images not displayed]

FINDINGS: The colonic stool burden remains increased. 3 radiodense AAA
battery-like foreign bodies remain. One lies in the mid descending
colon and two overlie the rectosigmoid.
IMPRESSION: Interval passage of 4 of the 7 radiodense foreign bodies.
Persistently increased colonic stool burden.

## 2017-06-20 IMAGING — DX DG ABDOMEN 1V
2 series · 2 of 2 positions shown · non-contrast
Comparison: Pain 14 [id number]

CLINICAL DATA: Foreign body ingestion

EXAM:
ABDOMEN - 1 VIEW

[abdomen kub (1 of 2)]
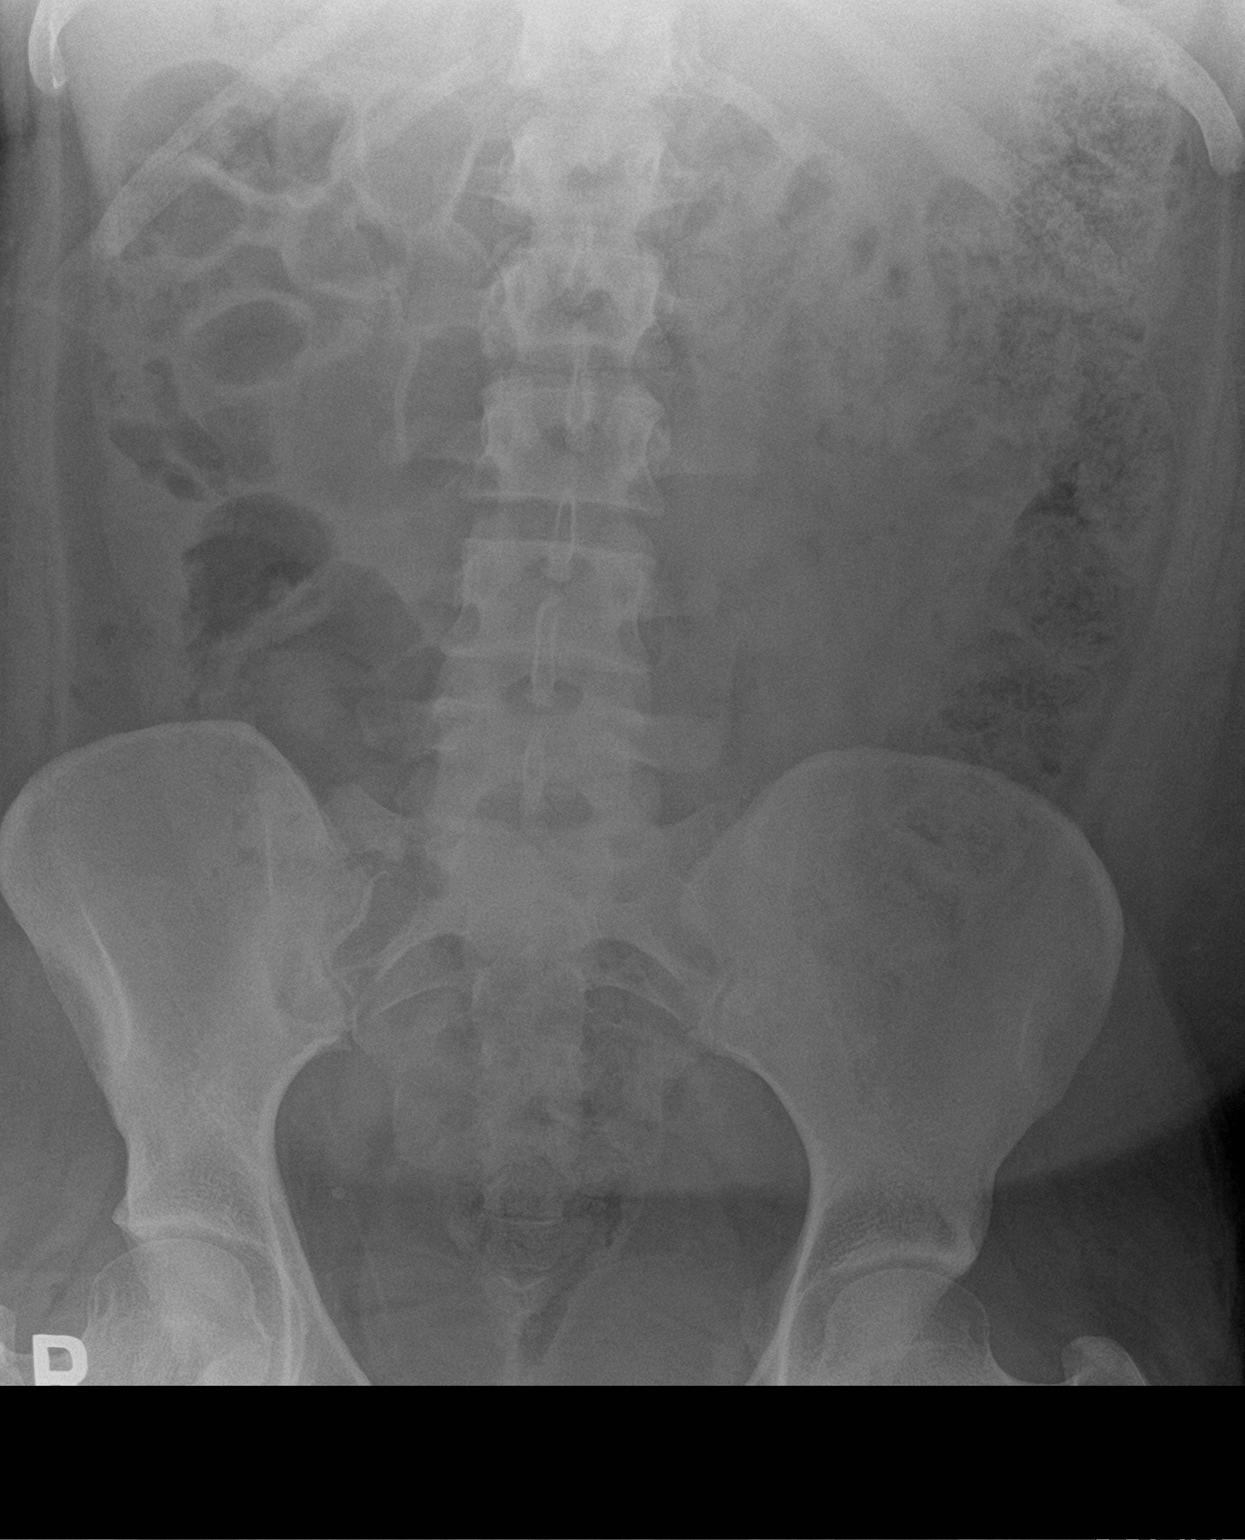

[abdomen kub (2 of 2)]
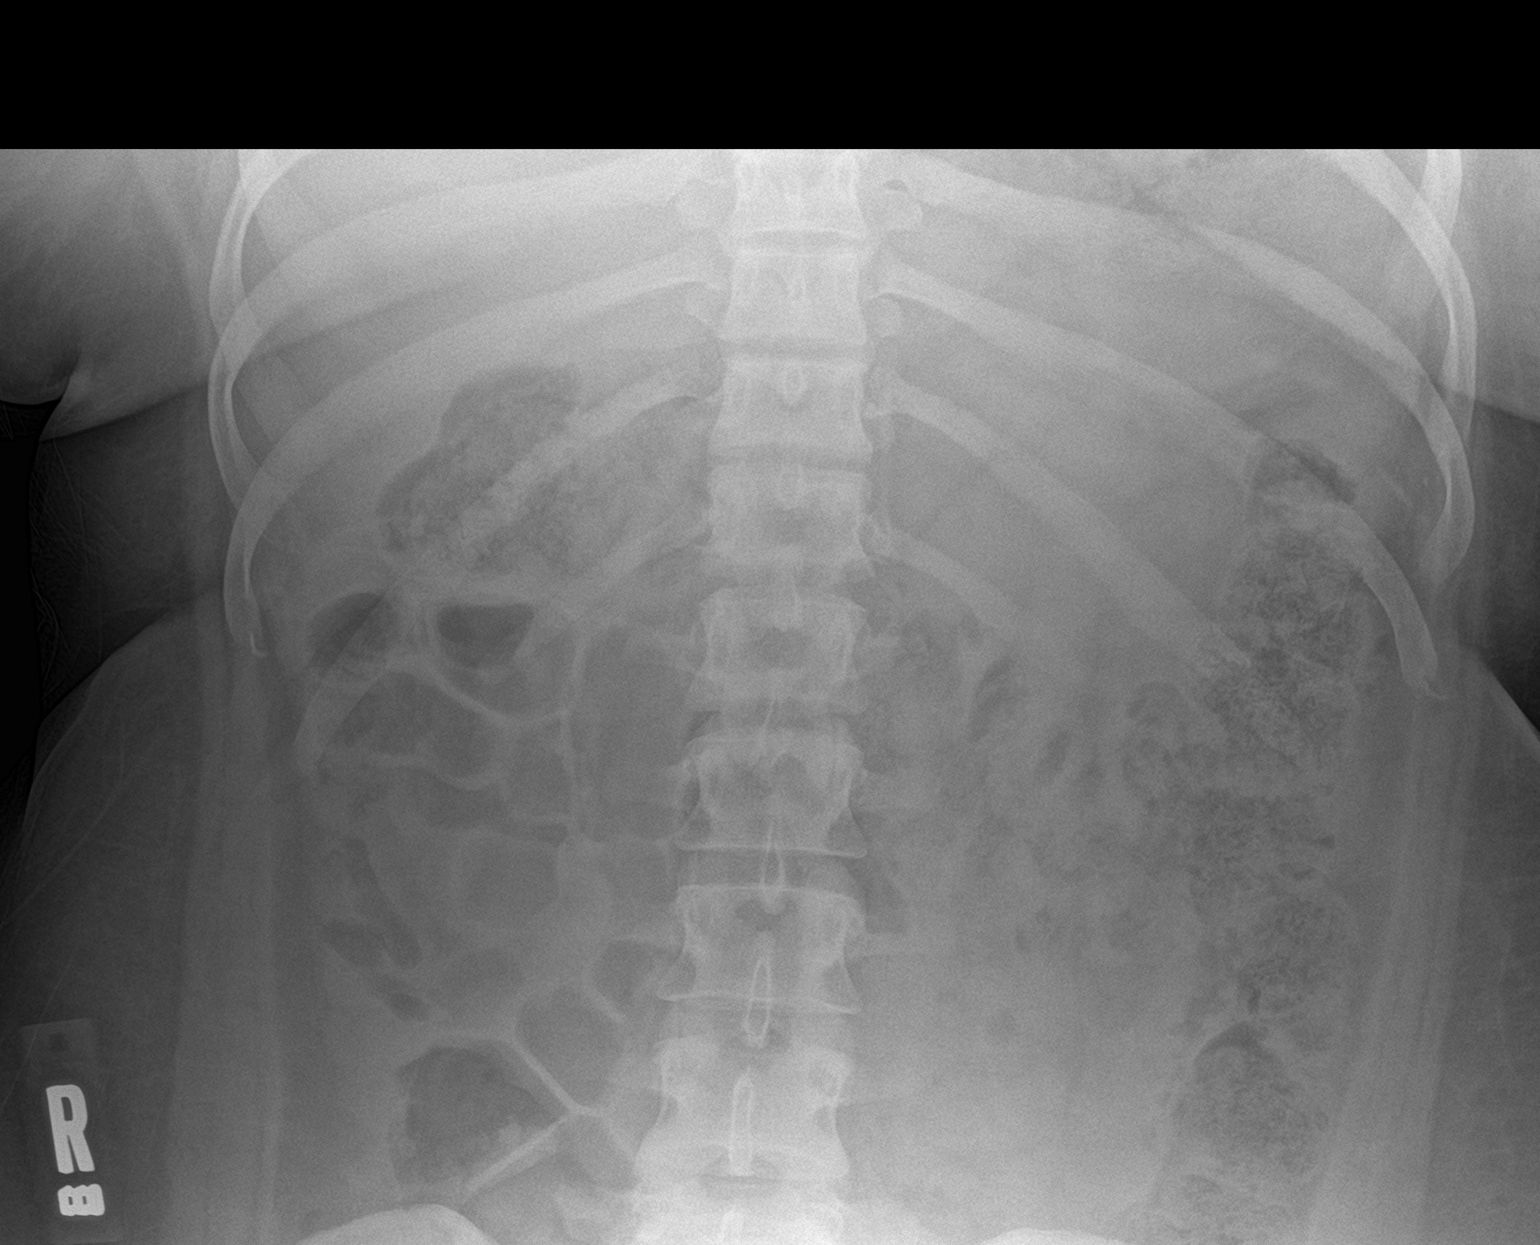

[2 of 2 positions shown; findings below may reference images not displayed]

FINDINGS: Previously seen radiopaque foreign bodies are no longer visualized.
Moderate stool burden in the colon. No evidence of bowel obstruction
or free air. No organomegaly or suspicious calcification.
IMPRESSION: Interval passage of the previously seen foreign bodies.

Moderate stool burden.

## 2019-03-01 ENCOUNTER — Other Ambulatory Visit: Payer: Self-pay

## 2019-03-01 DIAGNOSIS — Z20822 Contact with and (suspected) exposure to covid-19: Secondary | ICD-10-CM

## 2019-03-02 LAB — NOVEL CORONAVIRUS, NAA: SARS-CoV-2, NAA: NOT DETECTED

## 2020-10-23 ENCOUNTER — Other Ambulatory Visit: Payer: Self-pay

## 2020-10-23 ENCOUNTER — Emergency Department (HOSPITAL_COMMUNITY)
Admission: EM | Admit: 2020-10-23 | Discharge: 2020-10-23 | Disposition: A | Discharge status: Home / Self Care | Payer: Medicaid Other | Attending: Emergency Medicine | Admitting: Emergency Medicine

## 2020-10-23 DIAGNOSIS — F3130 Bipolar disorder, current episode depressed, mild or moderate severity, unspecified: Secondary | ICD-10-CM | POA: Diagnosis not present

## 2020-10-23 DIAGNOSIS — F989 Unspecified behavioral and emotional disorders with onset usually occurring in childhood and adolescence: Secondary | ICD-10-CM | POA: Diagnosis not present

## 2020-10-23 DIAGNOSIS — R443 Hallucinations, unspecified: Secondary | ICD-10-CM | POA: Diagnosis not present

## 2020-10-23 DIAGNOSIS — Z20822 Contact with and (suspected) exposure to covid-19: Secondary | ICD-10-CM | POA: Insufficient documentation

## 2020-10-23 DIAGNOSIS — Z7984 Long term (current) use of oral hypoglycemic drugs: Secondary | ICD-10-CM | POA: Diagnosis not present

## 2020-10-23 DIAGNOSIS — R45851 Suicidal ideations: Secondary | ICD-10-CM | POA: Diagnosis not present

## 2020-10-23 DIAGNOSIS — F4312 Post-traumatic stress disorder, chronic: Secondary | ICD-10-CM | POA: Diagnosis not present

## 2020-10-23 DIAGNOSIS — E039 Hypothyroidism, unspecified: Secondary | ICD-10-CM | POA: Diagnosis not present

## 2020-10-23 DIAGNOSIS — Z79899 Other long term (current) drug therapy: Secondary | ICD-10-CM | POA: Diagnosis not present

## 2020-10-23 DIAGNOSIS — E119 Type 2 diabetes mellitus without complications: Secondary | ICD-10-CM | POA: Insufficient documentation

## 2020-10-23 DIAGNOSIS — R4689 Other symptoms and signs involving appearance and behavior: Secondary | ICD-10-CM

## 2020-10-23 DIAGNOSIS — F909 Attention-deficit hyperactivity disorder, unspecified type: Secondary | ICD-10-CM | POA: Diagnosis not present

## 2020-10-23 LAB — CBC WITH DIFFERENTIAL/PLATELET
Abs Immature Granulocytes: 0.02 10*3/uL (ref 0.00–0.07)
Basophils Absolute: 0 10*3/uL (ref 0.0–0.1)
Basophils Relative: 1 %
Eosinophils Absolute: 0.1 10*3/uL (ref 0.0–0.5)
Eosinophils Relative: 1 %
HCT: 35.8 % — ABNORMAL LOW (ref 36.0–46.0)
Hemoglobin: 11.6 g/dL — ABNORMAL LOW (ref 12.0–15.0)
Immature Granulocytes: 0 %
Lymphocytes Relative: 24 %
Lymphs Abs: 1.5 10*3/uL (ref 0.7–4.0)
MCH: 31.6 pg (ref 26.0–34.0)
MCHC: 32.4 g/dL (ref 30.0–36.0)
MCV: 97.5 fL (ref 80.0–100.0)
Monocytes Absolute: 0.4 10*3/uL (ref 0.1–1.0)
Monocytes Relative: 6 %
Neutro Abs: 4.4 10*3/uL (ref 1.7–7.7)
Neutrophils Relative %: 68 %
Platelets: 402 10*3/uL — ABNORMAL HIGH (ref 150–400)
RBC: 3.67 MIL/uL — ABNORMAL LOW (ref 3.87–5.11)
RDW: 12.7 % (ref 11.5–15.5)
WBC: 6.4 10*3/uL (ref 4.0–10.5)
nRBC: 0 % (ref 0.0–0.2)

## 2020-10-23 LAB — RESP PANEL BY RT-PCR (FLU A&B, COVID) ARPGX2
Influenza A by PCR: NEGATIVE
Influenza B by PCR: NEGATIVE
SARS Coronavirus 2 by RT PCR: NEGATIVE

## 2020-10-23 LAB — RAPID URINE DRUG SCREEN, HOSP PERFORMED
Amphetamines: NOT DETECTED
Barbiturates: NOT DETECTED
Benzodiazepines: NOT DETECTED
Cocaine: NOT DETECTED
Opiates: NOT DETECTED
Tetrahydrocannabinol: NOT DETECTED

## 2020-10-23 LAB — COMPREHENSIVE METABOLIC PANEL
ALT: 9 U/L (ref 0–44)
AST: 14 U/L — ABNORMAL LOW (ref 15–41)
Albumin: 3.7 g/dL (ref 3.5–5.0)
Alkaline Phosphatase: 90 U/L (ref 38–126)
Anion gap: 9 (ref 5–15)
BUN: 8 mg/dL (ref 6–20)
CO2: 24 mmol/L (ref 22–32)
Calcium: 9.2 mg/dL (ref 8.9–10.3)
Chloride: 105 mmol/L (ref 98–111)
Creatinine, Ser: 0.88 mg/dL (ref 0.44–1.00)
GFR, Estimated: >60 mL/min (ref >60)
Glucose, Bld: 127 mg/dL — ABNORMAL HIGH (ref 70–99)
Potassium: 3.6 mmol/L (ref 3.5–5.1)
Sodium: 138 mmol/L (ref 135–145)
Total Bilirubin: 0.2 mg/dL — ABNORMAL LOW (ref 0.3–1.2)
Total Protein: 7.6 g/dL (ref 6.5–8.1)

## 2020-10-23 LAB — SALICYLATE LEVEL: Salicylate Lvl: <7 mg/dL — ABNORMAL LOW (ref 7.0–30.0)

## 2020-10-23 LAB — ETHANOL: Alcohol, Ethyl (B): <10 mg/dL (ref <10)

## 2020-10-23 LAB — ACETAMINOPHEN LEVEL: Acetaminophen (Tylenol), Serum: <10 ug/mL — ABNORMAL LOW (ref 10–30)

## 2020-10-23 NOTE — Discharge Instructions (Addendum)
Follow-up with your usual caregivers as needed for further care and treatment.  Talk to your therapist about the incident that occurred today.

## 2020-10-23 NOTE — ED Triage Notes (Signed)
[facility] transferred pt from "the day program by [employer]."   Pt says she stays at the day program Mon-Fri 9am-3pm. Her [facility] is in [facility]. States she didn't like how she was being treated at the day program, she told people, and no one listened. She began having suicidal thoughts. The day program called [facility].

## 2020-10-23 NOTE — ED Provider Notes (Signed)
[facility]-EMERGENCY DEPT Provider Note   CSN: [id number] Arrival date & time: 10/23/20  1413     History Chief Complaint  Patient presents with   Suicidal    Holly [name] is a 25 y.o. female.  HPI She resents for evaluation of suicidal ideation which occurred today when she was getting frustrated with caregivers at her daycare.  She is in adult daycare, 5 days a week.  She stays at a group home otherwise.  She is getting along well with her caregivers at the group home.  She takes her medicines regularly.  She saw her therapist, about a week ago and has a follow-up appointment in about 3 weeks.  She feels like she became suicidal when she was arguing with the caregivers at the daycare and that now her suicidality thoughts have almost entirely resolved.  She occasionally self injures, has scratched her left forearm and bit her toenail which caused some bleeding.  She does not have an active suicidal plan.  She is communicative, interactive.  She denies recent illness.  There are no other known active modifying factors.    Past Medical History:  Diagnosis Date   ADHD (attention deficit hyperactivity disorder)    Bipolar 1 disorder (HCC)    Depression    Diabetes mellitus    Gluten free diet    Hypothyroidism    Mood disorder (HCC)    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   Obesity    PTSD (post-traumatic stress disorder)     Patient Active Problem List   Diagnosis Date Noted   Morbid obesity (HCC) 12/24/2015   Depression    Ingestion of foreign body 12/17/2015   Hallucinations    Suicidal ideation    Injury, self-inflicted    Schizoaffective disorder, bipolar type (HCC)    Mild intellectual disability    Psychosis (HCC) 03/18/2014   ADHD (attention deficit hyperactivity disorder) 01/03/2013   PTSD (post-traumatic stress disorder) 01/03/2013   Agitation 01/03/2013    Past Surgical History:  Procedure Laterality Date   NO PAST SURGERIES     None        OB History   No obstetric history on file.     Family History  Adopted: Yes    Social History   Tobacco Use   Smoking status: Never   Smokeless tobacco: Never  Substance Use Topics   Alcohol use: No   Drug use: No    Home Medications Prior to Admission medications   Medication Sig Start Date End Date Taking? Authorizing Provider  amantadine (SYMMETREL) 100 MG capsule Take 100 mg by mouth 3 (three) times daily. Takes 1 in the morning, 1 at noon, and 1 at 1700    [provider]  bifidobacterium infantis (ALIGN) capsule Take 1 capsule by mouth daily.    [provider]  bisacodyl (DULCOLAX) 5 MG EC tablet Take 1 tablet (5 mg total) by mouth daily. 12/22/15   Narda Bonds, MD  doxepin (SINEQUAN) 25 MG capsule Take 1 capsule (25 mg total) by mouth at bedtime. 12/22/15   Narda Bonds, MD  FLUoxetine (PROZAC) 20 MG capsule Take 1 capsule (20 mg total) by mouth daily. For depression 08/24/15   Armandina Stammer I, NP  fluticasone (FLONASE) 50 MCG/ACT nasal spray Place 1 spray into both nostrils daily as needed for allergies. 08/24/15   Armandina Stammer I, NP  levothyroxine (SYNTHROID, LEVOTHROID) 100 MCG tablet Take 1 tablet (100 mcg total) by  mouth daily before breakfast. For low thyroid function 08/24/15   Armandina StammerNwoko, Agnes I, NP  linaclotide (LINZESS) 290 MCG CAPS capsule Take 290 mcg by mouth daily before breakfast.    [provider]  lithium carbonate (ESKALITH) 450 MG CR tablet Take 1 tablet (450 mg total) by mouth 2 (two) times daily after a meal. For mood stabilization Patient taking differently: Take 450-900 mg by mouth 2 (two) times daily after a meal. Takes 450mg  in the morning and 900mg  at bedtime 08/24/15   Armandina StammerNwoko, Agnes I, NP  medroxyPROGESTERone (DEPO-PROVERA) 150 MG/ML injection Inject 150 mg into the muscle every 3 (three) months.    [provider]  metFORMIN (GLUCOPHAGE) 500 MG tablet Take 1 tablet (500 mg) at breakfast & 2 tablets (1000 mg)  at supper: For diabetes Patient taking differently: Take 500-1,000 mg by mouth 2 (two) times daily with a meal. Take 1 tablet (500 mg) at breakfast & 2 tablets (1000 mg) at supper: For diabetes 08/24/15   Armandina StammerNwoko, Agnes I, NP  OLANZapine (ZYPREXA) 5 MG tablet Take 1 tablet (5 mg in the morning & 2 tablets (10 mg at bedtime: For mood control Patient taking differently: Take 5-10 mg by mouth 3 (three) times daily. Takes 5mg  in the morning and at 1600, then 10mg  at bedtime 08/24/15   Armandina StammerNwoko, Agnes I, NP  omeprazole (PRILOSEC) 20 MG capsule Take 20 mg by mouth daily with breakfast.     [provider]  polycarbophil (FIBERCON) 625 MG tablet Take 625 mg by mouth daily with breakfast.    [provider]    Allergies    Gluten meal and Lactose intolerance (gi)  Review of Systems   Review of Systems  All other systems reviewed and are negative.  Physical Exam Updated Vital Signs BP 125/75 (BP Location: Left Arm)   Pulse 94   Temp 98.2 F (36.8 C) (Oral)   Resp 17   SpO2 98%   Physical Exam Vitals and nursing note reviewed.  Constitutional:      Appearance: She is well-developed. She is not ill-appearing.  HENT:     Head: Normocephalic and atraumatic.     Right Ear: External ear normal.     Left Ear: External ear normal.  Eyes:     Conjunctiva/sclera: Conjunctivae normal.     Pupils: Pupils are equal, round, and reactive to light.  Neck:     Trachea: Phonation normal.  Cardiovascular:     Rate and Rhythm: Normal rate.  Pulmonary:     Effort: Pulmonary effort is normal.  Abdominal:     General: There is no distension.  Musculoskeletal:        General: Normal range of motion.     Cervical back: Normal range of motion and neck supple.  Skin:    General: Skin is warm and dry.     Comments: Few superficial healed abrasions left volar forearm, and right lateral ankle.  Neurological:     Mental Status: She is alert and oriented to person, place, and time.     Cranial  Nerves: No cranial nerve deficit.     Sensory: No sensory deficit.     Motor: No abnormal muscle tone.     Coordination: Coordination normal.  Psychiatric:        Mood and Affect: Mood normal.        Behavior: Behavior normal.        Thought Content: Thought content normal.  Judgment: Judgment normal.    ED Results / Procedures / Treatments   Labs (all labs ordered are listed, but only abnormal results are displayed) Labs Reviewed  COMPREHENSIVE METABOLIC PANEL - Abnormal; Notable for the following components:      Result Value   Glucose, Bld 127 (*)    AST 14 (*)    Total Bilirubin 0.2 (*)    All other components within normal limits  CBC WITH DIFFERENTIAL/PLATELET - Abnormal; Notable for the following components:   RBC 3.67 (*)    Hemoglobin 11.6 (*)    HCT 35.8 (*)    Platelets 402 (*)    All other components within normal limits  SALICYLATE LEVEL - Abnormal; Notable for the following components:   Salicylate Lvl <7.0 (*)    All other components within normal limits  ACETAMINOPHEN LEVEL - Abnormal; Notable for the following components:   Acetaminophen (Tylenol), Serum <10 (*)    All other components within normal limits  RESP PANEL BY RT-PCR (FLU A&B, COVID) ARPGX2  ETHANOL  RAPID URINE DRUG SCREEN, HOSP PERFORMED  I-STAT BETA HCG BLOOD, ED ([facility], [facility], [facility] ONLY)    EKG None  Radiology No results found.  Procedures Procedures   Medications Ordered in ED Medications - No data to display  ED Course  I have reviewed the triage vital signs and the nursing notes.  Pertinent labs & imaging results that were available during my care of the patient were reviewed by me and considered in my medical decision making (see chart for details).  Clinical Course as of 10/23/20 1651  Fri Oct 23, 2020  1517 I was able to reach the patient's group home director.  She has communicated with the patient's legal guardian.  The patient stable for discharge.  [name], the director,  will come and get her. [EW]    Clinical Course User Index [EW] Mancel Bale, MD   MDM Rules/Calculators/A&P                           Patient Vitals for the past 24 hrs:  BP Temp Temp src Pulse Resp SpO2  10/23/20 1447 125/75 98.2 F (36.8 C) Oral 94 17 98 %    4:48 PM Reevaluation with update and discussion. After initial assessment and treatment, an updated evaluation reveals she is calm and comfortable eating supper.  She denies suicidal thoughts at this time. Mancel Bale   Medical Decision Making:  This patient is presenting for evaluation of thoughts of suicide while arguing with caregivers, which does require a range of treatment options, and is a complaint that involves a moderate risk of morbidity and mortality. The differential diagnoses include acting out behavior, suicidality, chronic psychiatric illness. I decided to review old records, and in summary Young female, was arguing with daycare supervisors when she expressed suicide thoughts.  These improved after she left the daycare..  I obtained additional historical information from patient's group home director.  Clinical Laboratory Tests Ordered, included CBC, Metabolic panel, and drug screen, salicylate level, acetaminophen level, viral panel . Review indicates essentially normal findings.   Critical Interventions-clinical evaluation, laboratory testing, observation and reassessment  After These Interventions, the Patient was reevaluated and was found stable for discharge.  Patient's symptoms improved, he is no longer suicidal.  No risk for discharge at this time.  I have communicated with the patient's group home director, who is coming to get her.  CRITICAL CARE-no Performed by: Mechele Collin  Effie Shy  Nursing Notes Reviewed/ Care Coordinated Applicable Imaging Reviewed Interpretation of Laboratory Data incorporated into ED treatment  The patient appears reasonably screened and/or stabilized for discharge and I doubt  any other medical condition or other [facility] requiring further screening, evaluation, or treatment in the ED at this time prior to discharge.  Plan: Home Medications-continue usual; Home Treatments-regular activities; return here if the recommended treatment, does not improve the symptoms; Recommended follow up-PCP as needed.  Follow-up with therapist regarding the incident today.     Final Clinical Impression(s) / ED Diagnoses Final diagnoses:  Behavioral change    Rx / DC Orders ED Discharge Orders     None        Mancel Bale, MD 10/23/20 1652

## 2020-10-26 LAB — I-STAT BETA HCG BLOOD, ED (MC, WL, AP ONLY): I-stat hCG, quantitative: <5 m[IU]/mL (ref <5)

## 2021-10-23 ENCOUNTER — Encounter (HOSPITAL_BASED_OUTPATIENT_CLINIC_OR_DEPARTMENT_OTHER): Payer: Self-pay | Admitting: Emergency Medicine

## 2021-10-23 ENCOUNTER — Other Ambulatory Visit: Payer: Self-pay

## 2021-10-23 ENCOUNTER — Emergency Department (HOSPITAL_BASED_OUTPATIENT_CLINIC_OR_DEPARTMENT_OTHER)
Admission: EM | Admit: 2021-10-23 | Discharge: 2021-10-23 | Disposition: A | Discharge status: Home / Self Care | Payer: Medicaid Other | Attending: Emergency Medicine | Admitting: Emergency Medicine

## 2021-10-23 DIAGNOSIS — M7981 Nontraumatic hematoma of soft tissue: Secondary | ICD-10-CM | POA: Diagnosis present

## 2021-10-23 DIAGNOSIS — T148XXA Other injury of unspecified body region, initial encounter: Secondary | ICD-10-CM

## 2021-10-23 NOTE — ED Triage Notes (Addendum)
Pt arrives pov, steady gait, c/o right arm pain. Received depo injection x 2 days pta. Hematoma noted. Denies CP or shob

## 2021-10-26 NOTE — ED Provider Notes (Signed)
[facility] EMERGENCY DEPARTMENT Provider Note   CSN: [id number] Arrival date & time: 10/23/21  1111     History  Chief Complaint  Patient presents with   Arm Injury    Holly [name] is a 26 y.o. female.  HPI     Presents with concern for hematoma to right upper arm at site of depo injection. Received the injection 2 days ago. Had bleeding at the time, thinks may have hit blood vessel.  Also had sunburn.  No drainage, fever, chest pain or dyspnea.   Home Medications Prior to Admission medications   Medication Sig Start Date End Date Taking? Authorizing Provider  amantadine (SYMMETREL) 100 MG capsule Take 100 mg by mouth 3 (three) times daily. Takes 1 in the morning, 1 at noon, and 1 at 1700    [provider]  bifidobacterium infantis (ALIGN) capsule Take 1 capsule by mouth daily.    [provider]  bisacodyl (DULCOLAX) 5 MG EC tablet Take 1 tablet (5 mg total) by mouth daily. 12/22/15   Narda Bonds, MD  doxepin (SINEQUAN) 25 MG capsule Take 1 capsule (25 mg total) by mouth at bedtime. 12/22/15   Narda Bonds, MD  FLUoxetine (PROZAC) 20 MG capsule Take 1 capsule (20 mg total) by mouth daily. For depression 08/24/15   Armandina Stammer I, NP  fluticasone (FLONASE) 50 MCG/ACT nasal spray Place 1 spray into both nostrils daily as needed for allergies. 08/24/15   Armandina Stammer I, NP  levothyroxine (SYNTHROID, LEVOTHROID) 100 MCG tablet Take 1 tablet (100 mcg total) by mouth daily before breakfast. For low thyroid function 08/24/15   Armandina Stammer I, NP  linaclotide (LINZESS) 290 MCG CAPS capsule Take 290 mcg by mouth daily before breakfast.    [provider]  lithium carbonate (ESKALITH) 450 MG CR tablet Take 1 tablet (450 mg total) by mouth 2 (two) times daily after a meal. For mood stabilization Patient taking differently: Take 450-900 mg by mouth 2 (two) times daily after a meal. Takes 450mg  in the morning and 900mg  at bedtime 08/24/15   I, NP  medroxyPROGESTERone (DEPO-PROVERA) 150 MG/ML injection Inject 150 mg into the muscle every 3 (three) months.    [provider]  metFORMIN (GLUCOPHAGE) 500 MG tablet Take 1 tablet (500 mg) at breakfast & 2 tablets (1000 mg) at supper: For diabetes Patient taking differently: Take 500-1,000 mg by mouth 2 (two) times daily with a meal. Take 1 tablet (500 mg) at breakfast & 2 tablets (1000 mg) at supper: For diabetes 08/24/15   Armandina Stammer I, NP  OLANZapine (ZYPREXA) 5 MG tablet Take 1 tablet (5 mg in the morning & 2 tablets (10 mg at bedtime: For mood control Patient taking differently: Take 5-10 mg by mouth 3 (three) times daily. Takes 5mg  in the morning and at 1600, then 10mg  at bedtime 08/24/15   Armandina Stammer I, NP  omeprazole (PRILOSEC) 20 MG capsule Take 20 mg by mouth daily with breakfast.     [provider]  polycarbophil (FIBERCON) 625 MG tablet Take 625 mg by mouth daily with breakfast.    [provider]      Allergies    Gluten meal and Lactose intolerance (gi)    Review of Systems   Review of Systems  Physical Exam Updated Vital Signs BP 116/74 (BP Location: Left Arm)   Pulse (!) 108   Temp 97.9 F (36.6 C) (Oral)   Resp 18  Ht 5' (1.524 m)   Wt 104.3 kg   SpO2 97%   BMI 44.92 kg/m  Physical Exam Vitals and nursing note reviewed.  Constitutional:      General: She is not in acute distress.    Appearance: Normal appearance. She is not ill-appearing, toxic-appearing or diaphoretic.  HENT:     Head: Normocephalic.  Eyes:     Conjunctiva/sclera: Conjunctivae normal.  Cardiovascular:     Rate and Rhythm: Normal rate and regular rhythm.     Pulses: Normal pulses.  Pulmonary:     Effort: Pulmonary effort is normal. No respiratory distress.  Musculoskeletal:        General: Tenderness (contusion right upper arm at injection site) present. No deformity or signs of injury.     Cervical back: No rigidity.  Skin:    General: Skin is  warm and dry.     Coloration: Skin is not jaundiced or pale.  Neurological:     General: No focal deficit present.     Mental Status: She is alert and oriented to person, place, and time.     ED Results / Procedures / Treatments   Labs (all labs ordered are listed, but only abnormal results are displayed) Labs Reviewed - No data to display  EKG None  Radiology No results found.  Procedures Procedures    Medications Ordered in ED Medications - No data to display  ED Course/ Medical Decision Making/ A&P                           Medical Decision Making  26yo female presents with concern for hematoma to right upper arm at site of depo injection.  Has contusion without signs of complication, no infection, no sign of abscess and has normal pulses and no significant asymmetric swelling. Discussed recommendation for ice, supportive care as it resolves and can be complication of injection that will self resolve.  Group home representative has discussed with guardian.  Patient discharged in stable condition with understanding of reasons to return.         Final Clinical Impression(s) / ED Diagnoses Final diagnoses:  Hematoma    Rx / DC Orders ED Discharge Orders     None         Alvira Monday, MD 10/26/21 [phone]

## 2022-05-09 ENCOUNTER — Ambulatory Visit (HOSPITAL_COMMUNITY)
Admission: EM | Admit: 2022-05-09 | Discharge: 2022-05-09 | Disposition: A | Discharge status: Home / Self Care | Payer: Medicaid Other | Attending: Student | Admitting: Student

## 2022-05-09 ENCOUNTER — Encounter (HOSPITAL_COMMUNITY): Payer: Self-pay | Admitting: Student

## 2022-05-09 DIAGNOSIS — F4310 Post-traumatic stress disorder, unspecified: Secondary | ICD-10-CM | POA: Insufficient documentation

## 2022-05-09 DIAGNOSIS — Z760 Encounter for issue of repeat prescription: Secondary | ICD-10-CM | POA: Insufficient documentation

## 2022-05-09 DIAGNOSIS — R45851 Suicidal ideations: Secondary | ICD-10-CM | POA: Insufficient documentation

## 2022-05-09 DIAGNOSIS — F250 Schizoaffective disorder, bipolar type: Secondary | ICD-10-CM | POA: Insufficient documentation

## 2022-05-09 MED ORDER — LITHIUM CARBONATE ER 300 MG PO TBCR: 900.0000 mg | EXTENDED_RELEASE_TABLET | Freq: Every day | ORAL | 0 refills | Status: DC

## 2022-05-09 MED ORDER — CITALOPRAM HYDROBROMIDE 20 MG PO TABS: 20.0000 mg | ORAL_TABLET | Freq: Every day | ORAL | 0 refills | Status: DC

## 2022-05-09 MED ORDER — TRAZODONE HCL 300 MG PO TABS: 300.0000 mg | ORAL_TABLET | Freq: Every day | ORAL | 0 refills | Status: DC

## 2022-05-09 MED ORDER — FLUOXETINE HCL 20 MG PO CAPS: 20.0000 mg | ORAL_CAPSULE | Freq: Every day | ORAL | 0 refills | Status: DC

## 2022-05-09 MED ORDER — AMANTADINE HCL 100 MG PO CAPS: 100.0000 mg | ORAL_CAPSULE | Freq: Two times a day (BID) | ORAL | 0 refills | Status: DC

## 2022-05-09 MED ORDER — LEVOTHYROXINE SODIUM 100 MCG PO TABS: 100.0000 ug | ORAL_TABLET | Freq: Every day | ORAL | 0 refills | Status: DC

## 2022-05-09 MED ORDER — LYBALVI 10-10 MG PO TABS: 1.0000 | ORAL_TABLET | Freq: Every day | ORAL | 0 refills | Status: AC

## 2022-05-09 MED ORDER — METFORMIN HCL 500 MG PO TABS: ORAL_TABLET | ORAL | 0 refills | Status: DC

## 2022-05-09 NOTE — Discharge Summary (Signed)
[place] to be D/C'd Home per MD order. Discussed with the patient and all questions fully answered. An After Visit Summary was printed and given to the patient. Patient escorted out and D/C home via private auto.  Clois Dupes  05/09/2022 10:10 AM

## 2022-05-09 NOTE — Discharge Instructions (Signed)
Dear Holly [name],  Most effective treatment for your mental health disease involves BOTH a psychiatrist AND a therapist Psychiatrist to manage medications Therapist to help identify personal goals, barriers from those goals, and plan to achieve those goals by understanding emotions Please make regular appointments with an outpatient psychiatrist and other doctors once you leave the hospital (if any, otherwise, please see below for resources to make an appointment).  For therapy outside the hospital, please ask for these specific types of therapy: DBT ________________________________________________________  SAFETY CRISIS  Dial 988 for [place]    Text [phone] for Crisis Text Line:     [facility]:  [address] FIRST FLOOR.  [place], [place]  [address].  [phone]  Mobile Crisis Response Teams Listed by counties in vicinity of [place]. [phone] [place] [phone] [place] [phone] [facility] [phone] [place] [phone] [place]. [phone] [place].  [place] [phone] ________________________________________________________  To see which pharmacy near you is the CHEAPEST for certain medications, please use GoodRx. It is free website and has a free phone app.    Also consider looking at [facility] $4.00 or [facility]'s $7.00 prescription list. Both are free to view if googled "[facility]4 prescription" and "public's $7 prescription". These are set prices, no insurance required. [facility] low cost medications: $4-$15 for 30days  prescriptions or $10-$38 for 90days prescriptions  ________________________________________________________  Difficulties with sleep?   Can also use this free app for insomnia called CBT-I. Let your doctors and therapists know so they can help with extra tips and tricks or for guidance and accountability. NO ADDS on the app.     ________________________________________________________  Non-Emergent / Urgent  [facility] [address], [place], [place] [address] [phone] OUTPATIENT Walk-in information: Please note, all walk-ins are first come & first serve, with limited number of availability.  Please note that to be eligible for services you must bring: ID or a piece of mail with your name [facility] address  Therapist for therapy:  Monday & Wednesdays: Please ARRIVE at 7:15 AM for registration Will START at 8:00 AM Every 1st & 2nd Friday of the month: Please ARRIVE at 10:15 AM for registration Will START at 1 PM - 5 PM  Psychiatrist for medication management: Monday - Friday:  Please ARRIVE at 7:15 AM for registration Will START at 8:00 AM  Regretfully, due to limited availability, please be aware that you may not been seen on the same day as walk-in. Please consider making an appoint or try again. Thank you for your patience and understanding.

## 2022-05-09 NOTE — ED Provider Notes (Signed)
Behavioral Health Urgent Care Medical Screening Exam  Patient Name: Holly [name] MRN: [id number] Date of Evaluation: 05/09/22 Chief Complaint: Medication Refill Diagnosis:  Final diagnoses:  Encounter for medication refill    History of Present illness:  Holly [name] is a 27 y.o. female, with PMH of ADHD (attention deficit hyperactivity disorder); PTSD (post-traumatic stress disorder); Agitation; Psychosis (Sylvia); Schizoaffective disorder, bipolar type (Bruno); Mild intellectual disability; Injury, self-inflicted; Hallucinations; Suicidal ideation; Ingestion of foreign body; Depression, h/o psych hospitalization, who presented voluntary to [facility] (05/09/2022) with caregiver  for refill on home medications.  History was mainly obtained from caregiver per patient's consent and patient confirmed history per below.  Patient receives medications through mail order at [facility], however there was a mistake with the medication order and was not sent out. Caregiver attempted reach out to pharmacy, however was not able to contact due to holiday schedule. Caregiver was directed by coordinator at [facility] to come to [facility] for medication assistance. Patient has been stabilized on home psych rx (per below), tolerating well and is not experienced any side effects. Patient reported mood as "good", denied feeling down/depressed, anxious/stressed. Reported stable and appropriate sleep and appetite. Caregiver provided list of home medications.    Patient denied SI/HI/AVH, paranoia, delusions. Patient contracted to safety. Caregiver denied any safety concerns. Both were made aware of 988, 911 and [place] if there were safety concerns.   Current Outpatient Medications  Medication Instructions   amantadine (SYMMETREL) 100 mg, Oral, 2 times daily, Takes 1 at 8AM and 1 at 5PM   citalopram (CELEXA) 20 mg, Oral, Daily   FLUoxetine (PROZAC) 20 mg, Oral, Daily, For depression   levothyroxine  (SYNTHROID) 100 mcg, Oral, Daily before breakfast, For low thyroid function   linaclotide (LINZESS) 290 mcg, Oral, Daily before breakfast   lithium carbonate (LITHOBID) 900 mg, Oral, Daily at bedtime, For mood stabilization   medroxyPROGESTERone (DEPO-PROVERA) 150 mg, Intramuscular, Every 3 months   metFORMIN (GLUCOPHAGE) 500 MG tablet Take 1 tablet (500 mg) at breakfast & 2 tablets (1000 mg) at supper: For diabetes   OLANZapine-Samidorphan (LYBALVI) 10-10 MG TABS 1 tablet, Oral, Daily at bedtime   omeprazole (PRILOSEC) 20 mg, Oral, Daily with breakfast   trazodone (DESYREL) 300 mg, Oral, Daily at bedtime       Review of Systems  Respiratory:  Negative for shortness of breath.   Cardiovascular:  Negative for chest pain.  Gastrointestinal:  Negative for nausea and vomiting.  Neurological:  Negative for dizziness and headaches.   Exam BP 112/86 (BP Location: Left Arm)   Pulse 97   Temp 98.1 F (36.7 C) (Oral)   Resp 18   SpO2 97%  Physical Exam Vitals and nursing note reviewed. Exam conducted with a chaperone present.  Constitutional:      General: She is awake. She is not in acute distress.    Appearance: She is not ill-appearing, toxic-appearing or diaphoretic.  HENT:     Head: Normocephalic.  Pulmonary:     Effort: Pulmonary effort is normal. No respiratory distress.  Neurological:     Mental Status: She is alert and oriented to person, place, and time.     Psychiatric Specialty Exam: General Appearance: Appropriate for Environment; Casual; Fairly Groomed   Eye Contact: Good   Speech: Clear and Coherent; Normal Rate   Volume: Normal    Mood: Euthymic  Affect: Appropriate; Congruent; Full Range    Thought Process: Coherent; Goal Directed; Linear  Descriptions of  Associations: Intact  Duration of Psychotic Symptoms:NA Past Diagnosis of Schizophrenia or Psychoactive disorder:Yes  Orientation: Full (Time, Place and Person)   Thought Content: Logical;  WDL  Hallucinations: None   Ideas of Reference: None   Suicidal Thoughts: No  Homicidal Thoughts: No   Memory: Immediate Good    Judgement: Fair  Insight: Fair    Psychomotor Activity: Normal    Concentration: Good  Attention Span: Good  Recall: Good    Fund of Knowledge: Good    Language: Good    Handed: Right    Assets: Communication Skills; Desire for Improvement; Housing; Leisure Time; Social Support; Transportation    Sleep: Fair     Musculoskeletal: Strength & Muscle Tone: within normal limits Gait & Station: normal Patient leans: N/A   [place] MSE Discharge Disposition for Follow up and Recommendations: Based on my evaluation the patient does not appear to have an emergency medical condition and can be discharged with resources and follow up care in outpatient services for Medication Management  Provide printed scripts for psychotropics, per above.  Patient d/c'd home to ALS with caregiver.  Signed: Merrily Brittle, DO Psychiatry Resident, PGY-2 [facility]  05/09/2022, 9:18 AM

## 2022-05-09 NOTE — Progress Notes (Signed)
Holly [name] ROUTINE: is a 24 yr. old female who voluntarily presents to [facility] on this day due to running out of her psychotropic medication. Pt. denies SI/HI/AVH.  Pt. is medication complaint.  Pt. denies any history of SA. Pt. is alert and oriented x4, cooperative.   05/09/22 0803  [place] Triage Screening (Walk-ins at [facility] only)  How Did You Hear About [place]? Family/Friend  What Is the Reason for Your Visit/Call Today? Holly [name] ROUTINE: is a 87 yr. old female who voluntarily presents to [facility]  due to running out of psychotropic medication. Pt. denies SI/HI/AVH. Is medication complaint. Pt, Ias alert and oriented x4, cooperative.  How Long Has This Been Causing You Problems? <Week  Have You Recently Had Any Thoughts About Hurting Yourself? No  Are You Planning to Commit Suicide/Harm Yourself At This time? No  Have you Recently Had Thoughts About [place]? No  Are You Planning To Harm Someone At This Time? No  Are you currently experiencing any auditory, visual or other hallucinations? No  Have You Used Any Alcohol or Drugs in the Past 24 Hours? No  Do you have any current medical co-morbidities that require immediate attention? No  What Do You Feel Would Help You the Most Today? Medication(s)  If access to [facility] was not available, would you have sought care in the Emergency Department? No  Determination of Need Routine (7 days)  Options For Referral Medication Management

## 2022-09-13 ENCOUNTER — Ambulatory Visit
Admission: EM | Admit: 2022-09-13 | Discharge: 2022-09-13 | Disposition: A | Discharge status: Home / Self Care | Payer: Medicaid Other | Attending: Urgent Care | Admitting: Urgent Care

## 2022-09-13 DIAGNOSIS — L03115 Cellulitis of right lower limb: Secondary | ICD-10-CM | POA: Diagnosis not present

## 2022-09-13 MED ORDER — CLINDAMYCIN HCL 300 MG PO CAPS: 300.0000 mg | ORAL_CAPSULE | Freq: Three times a day (TID) | ORAL | 0 refills | Status: DC

## 2022-09-13 NOTE — ED Provider Notes (Signed)
[facility] - URGENT CARE CENTER  Note:  This document was prepared using Conservation officer, historic buildings and may include unintentional dictation errors.  MRN: [id number] DOB: 11-Nov-1995  Subjective:   Holly [name] is a 27 y.o. female presenting for 2 day history of acute onset right lower pain, swelling, redness. No trauma, bug bite, drainage of pus or bleeding. No fever. No history of dvt. Takes lithium.   No current facility-administered medications for this encounter.  Current Outpatient Medications:    amantadine (SYMMETREL) 100 MG capsule, Take 1 capsule (100 mg total) by mouth 2 (two) times daily for 7 days. Takes 1 at 8AM and 1 at 5PM, Disp: 14 capsule, Rfl: 0   citalopram (CELEXA) 20 MG tablet, Take 1 tablet (20 mg total) by mouth daily for 7 days., Disp: 7 tablet, Rfl: 0   FLUoxetine (PROZAC) 20 MG capsule, Take 1 capsule (20 mg total) by mouth daily for 7 days. For depression, Disp: 7 capsule, Rfl: 0   levothyroxine (SYNTHROID) 100 MCG tablet, Take 1 tablet (100 mcg total) by mouth daily before breakfast for 7 days. For low thyroid function, Disp: 7 tablet, Rfl: 0   linaclotide (LINZESS) 290 MCG CAPS capsule, Take 290 mcg by mouth daily before breakfast., Disp: , Rfl:    lithium carbonate (LITHOBID) 300 MG ER tablet, Take 3 tablets (900 mg total) by mouth at bedtime for 7 days. For mood stabilization, Disp: 21 tablet, Rfl: 0   medroxyPROGESTERone (DEPO-PROVERA) 150 MG/ML injection, Inject 150 mg into the muscle every 3 (three) months., Disp: , Rfl:    metFORMIN (GLUCOPHAGE) 500 MG tablet, Take 1 tablet (500 mg) at breakfast & 2 tablets (1000 mg) at supper: For diabetes, Disp: 21 tablet, Rfl: 0   omeprazole (PRILOSEC) 20 MG capsule, Take 20 mg by mouth daily with breakfast. , Disp: , Rfl:    trazodone (DESYREL) 300 MG tablet, Take 1 tablet (300 mg total) by mouth at bedtime for 7 days., Disp: 7 tablet, Rfl: 0   Allergies  Allergen Reactions   Gluten Meal Other (See  Comments)    Upset stomach - patient denies   Lactose Intolerance (Gi) Nausea Only and Other (See Comments)    Upset stomach    Past Medical History:  Diagnosis Date   ADHD (attention deficit hyperactivity disorder)    Bipolar 1 disorder (HCC)    Depression    Diabetes mellitus    Gluten free diet    Hypothyroidism    Mood disorder (HCC)    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   Obesity    PTSD (post-traumatic stress disorder)      Past Surgical History:  Procedure Laterality Date   NO PAST SURGERIES     None      Family History  Adopted: Yes    Social History   Tobacco Use   Smoking status: Never   Smokeless tobacco: Never  Vaping Use   Vaping Use: Never used  Substance Use Topics   Alcohol use: No   Drug use: No    ROS   Objective:   Vitals: BP 124/84 (BP Location: Right Arm)   Temp 99.4 F (37.4 C) (Oral)   Resp 18   SpO2 95%   Physical Exam Constitutional:      General: She is not in acute distress.    Appearance: Normal appearance. She is well-developed. She is not ill-appearing, toxic-appearing or diaphoretic.  HENT:     Head: Normocephalic and atraumatic.  Nose: Nose normal.     Mouth/Throat:     Mouth: Mucous membranes are moist.  Eyes:     General: No scleral icterus.       Right eye: No discharge.        Left eye: No discharge.     Extraocular Movements: Extraocular movements intact.  Cardiovascular:     Rate and Rhythm: Normal rate.  Pulmonary:     Effort: Pulmonary effort is normal.  Musculoskeletal:     Right lower leg: Tenderness present. No deformity, lacerations or bony tenderness.       Legs:  Skin:    General: Skin is warm and dry.  Neurological:     General: No focal deficit present.     Mental Status: She is alert and oriented to person, place, and time.  Psychiatric:        Mood and Affect: Mood normal.        Behavior: Behavior normal.      Assessment and Plan :   PDMP not reviewed this encounter.  1.  Cellulitis of right lower extremity    Start cellulitis management with clindamycin. Have to avoid drug interactions and therefore this is a reasonable option. Use warm compresses. Low suspicion for dvt. Counseled patient on potential for adverse effects with medications prescribed/recommended today, ER and return-to-clinic precautions discussed, patient verbalized understanding.    Wallis Bamberg, [place] 09/13/22 1558

## 2022-09-13 NOTE — ED Triage Notes (Signed)
Pt reports pain and swelling in right lower leg x 2 days. Reports is worse when she sleeps and put pressure on.

## 2022-09-22 ENCOUNTER — Emergency Department (HOSPITAL_COMMUNITY): Payer: Medicaid Other

## 2022-09-22 ENCOUNTER — Other Ambulatory Visit: Payer: Self-pay

## 2022-09-22 ENCOUNTER — Encounter (HOSPITAL_COMMUNITY): Payer: Self-pay

## 2022-09-22 ENCOUNTER — Inpatient Hospital Stay (HOSPITAL_COMMUNITY)
Admission: EM | Admit: 2022-09-22 | Discharge: 2022-09-27 | DRG: 164 | Disposition: A | Discharge status: Home / Self Care | Payer: Medicaid Other | Source: Ambulatory Visit | Attending: Internal Medicine | Admitting: Internal Medicine

## 2022-09-22 ENCOUNTER — Inpatient Hospital Stay (HOSPITAL_COMMUNITY): Payer: Medicaid Other

## 2022-09-22 DIAGNOSIS — Z79890 Hormone replacement therapy (postmenopausal): Secondary | ICD-10-CM | POA: Diagnosis not present

## 2022-09-22 DIAGNOSIS — E039 Hypothyroidism, unspecified: Secondary | ICD-10-CM | POA: Diagnosis present

## 2022-09-22 DIAGNOSIS — R7989 Other specified abnormal findings of blood chemistry: Secondary | ICD-10-CM | POA: Diagnosis not present

## 2022-09-22 DIAGNOSIS — Z86711 Personal history of pulmonary embolism: Secondary | ICD-10-CM | POA: Diagnosis not present

## 2022-09-22 DIAGNOSIS — F70 Mild intellectual disabilities: Secondary | ICD-10-CM | POA: Diagnosis present

## 2022-09-22 DIAGNOSIS — Z79899 Other long term (current) drug therapy: Secondary | ICD-10-CM

## 2022-09-22 DIAGNOSIS — F4310 Post-traumatic stress disorder, unspecified: Secondary | ICD-10-CM | POA: Diagnosis present

## 2022-09-22 DIAGNOSIS — E876 Hypokalemia: Secondary | ICD-10-CM | POA: Diagnosis present

## 2022-09-22 DIAGNOSIS — T385X5A Adverse effect of other estrogens and progestogens, initial encounter: Secondary | ICD-10-CM | POA: Diagnosis present

## 2022-09-22 DIAGNOSIS — E739 Lactose intolerance, unspecified: Secondary | ICD-10-CM | POA: Diagnosis present

## 2022-09-22 DIAGNOSIS — R0602 Shortness of breath: Secondary | ICD-10-CM | POA: Diagnosis present

## 2022-09-22 DIAGNOSIS — I2609 Other pulmonary embolism with acute cor pulmonale: Secondary | ICD-10-CM | POA: Diagnosis present

## 2022-09-22 DIAGNOSIS — I2489 Other forms of acute ischemic heart disease: Secondary | ICD-10-CM | POA: Diagnosis present

## 2022-09-22 DIAGNOSIS — I272 Pulmonary hypertension, unspecified: Secondary | ICD-10-CM | POA: Diagnosis present

## 2022-09-22 DIAGNOSIS — I2602 Saddle embolus of pulmonary artery with acute cor pulmonale: Secondary | ICD-10-CM

## 2022-09-22 DIAGNOSIS — Z7984 Long term (current) use of oral hypoglycemic drugs: Secondary | ICD-10-CM | POA: Diagnosis not present

## 2022-09-22 DIAGNOSIS — I82431 Acute embolism and thrombosis of right popliteal vein: Secondary | ICD-10-CM | POA: Diagnosis present

## 2022-09-22 DIAGNOSIS — I82451 Acute embolism and thrombosis of right peroneal vein: Secondary | ICD-10-CM | POA: Diagnosis present

## 2022-09-22 DIAGNOSIS — I2699 Other pulmonary embolism without acute cor pulmonale: Principal | ICD-10-CM | POA: Diagnosis present

## 2022-09-22 DIAGNOSIS — F909 Attention-deficit hyperactivity disorder, unspecified type: Secondary | ICD-10-CM | POA: Diagnosis present

## 2022-09-22 DIAGNOSIS — F259 Schizoaffective disorder, unspecified: Secondary | ICD-10-CM | POA: Diagnosis present

## 2022-09-22 DIAGNOSIS — Z6839 Body mass index (BMI) 39.0-39.9, adult: Secondary | ICD-10-CM

## 2022-09-22 DIAGNOSIS — I82441 Acute embolism and thrombosis of right tibial vein: Secondary | ICD-10-CM | POA: Diagnosis present

## 2022-09-22 DIAGNOSIS — R000 Tachycardia, unspecified: Secondary | ICD-10-CM

## 2022-09-22 DIAGNOSIS — I2693 Single subsegmental pulmonary embolism without acute cor pulmonale: Secondary | ICD-10-CM | POA: Diagnosis not present

## 2022-09-22 DIAGNOSIS — K5909 Other constipation: Secondary | ICD-10-CM | POA: Diagnosis present

## 2022-09-22 DIAGNOSIS — I4891 Unspecified atrial fibrillation: Secondary | ICD-10-CM | POA: Diagnosis present

## 2022-09-22 DIAGNOSIS — E119 Type 2 diabetes mellitus without complications: Secondary | ICD-10-CM | POA: Diagnosis present

## 2022-09-22 LAB — COMPREHENSIVE METABOLIC PANEL
ALT: 10 U/L (ref 0–44)
AST: 22 U/L (ref 15–41)
Albumin: 3.4 g/dL — ABNORMAL LOW (ref 3.5–5.0)
Alkaline Phosphatase: 110 U/L (ref 38–126)
Anion gap: 14 (ref 5–15)
BUN: 7 mg/dL (ref 6–20)
CO2: 18 mmol/L — ABNORMAL LOW (ref 22–32)
Calcium: 8.9 mg/dL (ref 8.9–10.3)
Chloride: 105 mmol/L (ref 98–111)
Creatinine, Ser: 1.1 mg/dL — ABNORMAL HIGH (ref 0.44–1.00)
GFR, Estimated: >60 mL/min (ref >60)
Glucose, Bld: 140 mg/dL — ABNORMAL HIGH (ref 70–99)
Potassium: 3.6 mmol/L (ref 3.5–5.1)
Sodium: 137 mmol/L (ref 135–145)
Total Bilirubin: 0.3 mg/dL (ref 0.3–1.2)
Total Protein: 7.3 g/dL (ref 6.5–8.1)

## 2022-09-22 LAB — I-STAT CHEM 8, ED
BUN: 6 mg/dL (ref 6–20)
Calcium, Ion: 1.09 mmol/L — ABNORMAL LOW (ref 1.15–1.40)
Chloride: 106 mmol/L (ref 98–111)
Creatinine, Ser: 0.9 mg/dL (ref 0.44–1.00)
Glucose, Bld: 141 mg/dL — ABNORMAL HIGH (ref 70–99)
HCT: 41 % (ref 36.0–46.0)
Hemoglobin: 13.9 g/dL (ref 12.0–15.0)
Potassium: 3.8 mmol/L (ref 3.5–5.1)
Sodium: 140 mmol/L (ref 135–145)
TCO2: 20 mmol/L — ABNORMAL LOW (ref 22–32)

## 2022-09-22 LAB — CBC WITH DIFFERENTIAL/PLATELET
Abs Immature Granulocytes: 0.03 10*3/uL (ref 0.00–0.07)
Basophils Absolute: 0.1 10*3/uL (ref 0.0–0.1)
Basophils Relative: 1 %
Eosinophils Absolute: 0.1 10*3/uL (ref 0.0–0.5)
Eosinophils Relative: 1 %
HCT: 41.6 % (ref 36.0–46.0)
Hemoglobin: 13.4 g/dL (ref 12.0–15.0)
Immature Granulocytes: 0 %
Lymphocytes Relative: 16 %
Lymphs Abs: 1.5 10*3/uL (ref 0.7–4.0)
MCH: 29.8 pg (ref 26.0–34.0)
MCHC: 32.2 g/dL (ref 30.0–36.0)
MCV: 92.4 fL (ref 80.0–100.0)
Monocytes Absolute: 1 10*3/uL (ref 0.1–1.0)
Monocytes Relative: 10 %
Neutro Abs: 6.9 10*3/uL (ref 1.7–7.7)
Neutrophils Relative %: 72 %
Platelets: 244 10*3/uL (ref 150–400)
RBC: 4.5 MIL/uL (ref 3.87–5.11)
RDW: 13.2 % (ref 11.5–15.5)
WBC: 9.5 10*3/uL (ref 4.0–10.5)
nRBC: 0 % (ref 0.0–0.2)

## 2022-09-22 LAB — TSH: TSH: 2.16 u[IU]/mL (ref 0.350–4.500)

## 2022-09-22 LAB — LACTIC ACID, PLASMA
Lactic Acid, Venous: 1.8 mmol/L (ref 0.5–1.9)
Lactic Acid, Venous: 1.6 mmol/L (ref 0.5–1.9)

## 2022-09-22 LAB — I-STAT BETA HCG BLOOD, ED (MC, WL, AP ONLY): I-stat hCG, quantitative: <5 m[IU]/mL (ref <5)

## 2022-09-22 LAB — T4, FREE: Free T4: 1.4 ng/dL — ABNORMAL HIGH (ref 0.61–1.12)

## 2022-09-22 LAB — BRAIN NATRIURETIC PEPTIDE: B Natriuretic Peptide: 605.5 pg/mL — ABNORMAL HIGH (ref 0.0–100.0)

## 2022-09-22 LAB — MAGNESIUM: Magnesium: 1.9 mg/dL (ref 1.7–2.4)

## 2022-09-22 LAB — MRSA NEXT GEN BY PCR, NASAL: MRSA by PCR Next Gen: NOT DETECTED

## 2022-09-22 LAB — TROPONIN I (HIGH SENSITIVITY)
Troponin I (High Sensitivity): 144 ng/L (ref <18)
Troponin I (High Sensitivity): 158 ng/L (ref <18)

## 2022-09-22 MED ORDER — FENTANYL CITRATE (PF) 100 MCG/2ML IJ SOLN
INTRAMUSCULAR | Status: AC | PRN
  Administered 2022-09-22: 12.5 ug via INTRAVENOUS
  Administered 2022-09-22 (×3): 25 ug via INTRAVENOUS

## 2022-09-22 MED ORDER — CHLORHEXIDINE GLUCONATE CLOTH 2 % EX PADS
6.0000 | MEDICATED_PAD | Freq: Every day | CUTANEOUS | Status: DC
  Administered 2022-09-22 – 2022-09-23 (×2): 6 via TOPICAL

## 2022-09-22 MED ORDER — FENTANYL CITRATE (PF) 100 MCG/2ML IJ SOLN
INTRAMUSCULAR | Status: AC
  Filled 2022-09-22: qty 2

## 2022-09-22 MED ORDER — DIPHENHYDRAMINE HCL 50 MG/ML IJ SOLN
INTRAMUSCULAR | Status: AC
  Filled 2022-09-22: qty 1

## 2022-09-22 MED ORDER — HEPARIN (PORCINE) 25000 UT/250ML-% IV SOLN
1800.0000 [IU]/h | INTRAVENOUS | Status: DC
  Administered 2022-09-22 – 2022-09-23 (×2): 1200 [IU]/h via INTRAVENOUS
  Administered 2022-09-24: 1800 [IU]/h via INTRAVENOUS
  Administered 2022-09-24: 1500 [IU]/h via INTRAVENOUS
  Administered 2022-09-25: 1800 [IU]/h via INTRAVENOUS
  Filled 2022-09-22 (×6): qty 250

## 2022-09-22 MED ORDER — LACTATED RINGERS IV BOLUS
1000.0000 mL | Freq: Once | INTRAVENOUS | Status: AC
  Administered 2022-09-22: 1000 mL via INTRAVENOUS

## 2022-09-22 MED ORDER — IOHEXOL 300 MG/ML  SOLN
100.0000 mL | Freq: Once | INTRAMUSCULAR | Status: AC | PRN
  Administered 2022-09-23: 40 mL via INTRA_ARTERIAL

## 2022-09-22 MED ORDER — IOHEXOL 350 MG/ML SOLN
58.0000 mL | Freq: Once | INTRAVENOUS | Status: AC | PRN
  Administered 2022-09-22: 58 mL via INTRAVENOUS

## 2022-09-22 MED ORDER — MIDAZOLAM HCL 2 MG/2ML IJ SOLN
INTRAMUSCULAR | Status: AC
  Filled 2022-09-22: qty 2

## 2022-09-22 MED ORDER — HEPARIN SODIUM (PORCINE) 1000 UNIT/ML IJ SOLN
INTRAMUSCULAR | Status: AC
  Filled 2022-09-22: qty 10

## 2022-09-22 MED ORDER — LIDOCAINE HCL 1 % IJ SOLN
INTRAMUSCULAR | Status: AC
  Filled 2022-09-22: qty 20

## 2022-09-22 MED ORDER — INSULIN ASPART 100 UNIT/ML IJ SOLN
0.0000 [IU] | INTRAMUSCULAR | Status: DC
  Administered 2022-09-23: 2 [IU] via SUBCUTANEOUS
  Administered 2022-09-24: 3 [IU] via SUBCUTANEOUS
  Administered 2022-09-25 – 2022-09-26 (×2): 2 [IU] via SUBCUTANEOUS

## 2022-09-22 MED ORDER — MIDAZOLAM HCL 2 MG/2ML IJ SOLN
INTRAMUSCULAR | Status: AC | PRN
  Administered 2022-09-22 (×4): .5 mg via INTRAVENOUS

## 2022-09-22 MED ORDER — HEPARIN BOLUS VIA INFUSION
5000.0000 [IU] | Freq: Once | INTRAVENOUS | Status: AC
  Administered 2022-09-22: 5000 [IU] via INTRAVENOUS
  Filled 2022-09-22: qty 5000

## 2022-09-22 MED ORDER — DIPHENHYDRAMINE HCL 50 MG/ML IJ SOLN
INTRAMUSCULAR | Status: AC | PRN
  Administered 2022-09-22: 25 mg via INTRAVENOUS
  Administered 2022-09-22 (×2): 12.5 mg via INTRAVENOUS

## 2022-09-22 MED ORDER — DOCUSATE SODIUM 100 MG PO CAPS: 100.0000 mg | ORAL_CAPSULE | Freq: Two times a day (BID) | ORAL | Status: DC | PRN

## 2022-09-22 MED ORDER — HEPARIN SODIUM (PORCINE) 1000 UNIT/ML IJ SOLN
INTRAMUSCULAR | Status: AC | PRN
  Administered 2022-09-22: 5000 [IU] via INTRAVENOUS
  Administered 2022-09-22: 3000 [IU] via INTRAVENOUS

## 2022-09-22 MED ORDER — POLYETHYLENE GLYCOL 3350 17 G PO PACK: 17.0000 g | PACK | Freq: Every day | ORAL | Status: DC | PRN

## 2022-09-22 NOTE — Progress Notes (Signed)
eLink Physician-Brief Progress Note Patient Name: Holly [name] DOB: 1996/03/11 MRN: [id number]   Date of Service  09/22/2022  HPI/Events of Note  Patient admitted with sub-massive PE requiring IR thrombolysis.  eICU Interventions  New Patient Evaluation.        Thomasene Lot Xee [name] 09/22/2022, 10:27 PM

## 2022-09-22 NOTE — Consult Note (Signed)
Vascular and Interventional Radiology  PRE PROCEDURE H&P  Assessment  Plan:   [name] is a 27 y.o. year old female who will undergo PULMONARY ARTERIOGRAPHY, POSSIBLE THROMBECTOMY FOR PULMONARY EMBOLI in Interventional Radiology.  The procedure has been fully reviewed with the patient/patient's authorized representative. The risks, benefits and alternatives have been explained, and the patient/patient's authorized representative has consented to the procedure.  HPI: [name] is a 27 y.o. year old female w PMHx significant for schizoaffective d/o who presented to PCP earlier today and noted to have Afib w RVR. ER evaluation including CTA PE demonstrating significant pulmonary emboli burden with heart strain, RV/LV 1.7. Pt tachycardic and with positive cardiac enzymes. VIR consulted by PCCM for potential catheter-directed therapy.   Informed consent was obtained, witnessed and placed in the patient's chart.    Allergies:  Allergies  Allergen Reactions   Gluten Meal Other (See Comments)    Upset stomach - patient denies   Lactose Intolerance (Gi) Nausea Only and Other (See Comments)    Upset stomach    Medications:  No current facility-administered medications on file prior to encounter.   Current Outpatient Medications on File Prior to Encounter  Medication Sig Dispense Refill   citalopram (CELEXA) 20 MG tablet Take 1 tablet (20 mg total) by mouth daily for 7 days. 7 tablet 0   FLUoxetine (PROZAC) 20 MG capsule Take 1 capsule (20 mg total) by mouth daily for 7 days. For depression 7 capsule 0   levothyroxine (SYNTHROID) 100 MCG tablet Take 1 tablet (100 mcg total) by mouth daily before breakfast for 7 days. For low thyroid function 7 tablet 0   lithium carbonate (LITHOBID) 300 MG ER tablet Take 3 tablets (900 mg total) by mouth at bedtime for 7 days. For mood stabilization 21 tablet 0   metFORMIN (GLUCOPHAGE) 500 MG tablet Take 1 tablet (500 mg) at breakfast &  2 tablets (1000 mg) at supper: For diabetes 21 tablet 0   trazodone (DESYREL) 300 MG tablet Take 1 tablet (300 mg total) by mouth at bedtime for 7 days. 7 tablet 0   amantadine (SYMMETREL) 100 MG capsule Take 1 capsule (100 mg total) by mouth 2 (two) times daily for 7 days. Takes 1 at 8AM and 1 at 5PM 14 capsule 0   clindamycin (CLEOCIN) 300 MG capsule Take 1 capsule (300 mg total) by mouth 3 (three) times daily. 30 capsule 0   linaclotide (LINZESS) 290 MCG CAPS capsule Take 290 mcg by mouth daily before breakfast.     medroxyPROGESTERone (DEPO-PROVERA) 150 MG/ML injection Inject 150 mg into the muscle every 3 (three) months.      PSH:  Past Surgical History:  Procedure Laterality Date   NO PAST SURGERIES     None      PMH:  Past Medical History:  Diagnosis Date   ADHD (attention deficit hyperactivity disorder)    Bipolar 1 disorder (HCC)    Depression    Diabetes mellitus    Gluten free diet    Hypothyroidism    Mood disorder (HCC)    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   Obesity    PTSD (post-traumatic stress disorder)     Brief Physical Examination: Vitals:   09/22/22 2200 09/22/22 2245  BP: 110/88 105/77  Pulse: (!) 139 (!) 138  Resp: 19 (!) 34  Temp:    SpO2: 95% 96%   General: WD, WN female in NAD HEENT: Normocephalic, atraumatic Lungs: Respirations  non-labored  ASA Grade: 3 Mallampati Class: 3   Roanna Banning, MD [facility] Radiology   Pager. [phone] Clinic. [phone]

## 2022-09-22 NOTE — ED Notes (Signed)
Pt handoff report given to [employer].

## 2022-09-22 NOTE — Procedures (Signed)
Vascular and Interventional Radiology Procedure Note  Patient: Holly [name] DOB: 06-08-1995 Medical Record Number: [id number] Note Date/Time: 09/22/22 10:55 PM   Performing Physician: Roanna Banning, MD Assistant(s): None  Diagnosis: Submassive PE. Pulmonary HTN w R heart dysfunction   Procedure:   PULMONARY ARTERIOGRAPHY MECHANICAL THROMBECTOMY, Catheter-directed   Anesthesia: Conscious Sedation Complications: None Estimated Blood Loss: Minimal Specimens: Pathology  Findings:  - access via the RIGHT femoral vein. - PE burden at pulmonary arterial bifurcations - Successful mechanical thrombectomy with 64F Penumbra Lightning - Good angiographic result and improved Pt parameters (HR, O2 requirement and SpO2 at the end of the case  Plan: - Post sheath removal precautions. Bedrest with RLE straight x2hrs. - Purse string suture at R groin. Will be removed by VIR service on rounds in AM.  Final report to follow once all images are reviewed and compared with previous studies.  See detailed dictation with images in PACS. The patient tolerated the procedure well without incident or complication and was returned to ICU in stable condition.    Roanna Banning, MD [facility] Radiology   Pager. [phone] Clinic. [phone]

## 2022-09-22 NOTE — H&P (Addendum)
NAME:  Holly [name], MRN:  [id number], DOB:  04-Aug-1995, LOS: 0 ADMISSION DATE:  09/22/2022, CONSULTATION DATE:  09/22/22 REFERRING MD:  Holly [name], CHIEF COMPLAINT:  dyspnea   History of Present Illness:  27yF with history of IDD, BiPD, Shizoaffective d/o, PTSD, lives with caregiver/has guardian, who is seen in ED for acute dyspnea. She was seen in PCP office today for it and found to be in AF/RVR per caregiver. Here she has been given a liter of crystalloid and was found to have PE with large clot burden, radiographic right heart strain and borderline massive/submassive physiology. PCCM was consulted.   She has never had any prior blood clot. She was seen in ED here for RLE swelling and was given ABX for cellulitis. She is on depo provera and has been on it for years. No recent surgery/travel. Never smoker. No family history of blood clots. No bleeding issues or head trauma.   Pertinent  Medical History  Mood/psychotic disorders IDD DM  Significant Hospital Events: Including procedures, antibiotic start and stop dates in addition to other pertinent events   5/16 admitted to ICU for high risk PE, IR consulted  Interim History / Subjective:    Objective   Blood pressure 99/67, pulse (!) 124, temperature (!) 97.5 F (36.4 C), resp. rate (!) 22, SpO2 98 %.       No intake or output data in the 24 hours ending 09/22/22 1954 There were no vitals filed for this visit.  Examination: General appearance: 27 y.o., female, NAD, conversant  Eyes: anicteric sclerae; PERRL, tracking appropriately HENT: NCAT; MMM Neck: Trachea midline; no lymphadenopathy, no JVD Lungs: CTAB, no crackles, no wheeze, with normal respiratory effort CV: tachy RR, no murmur  Abdomen: Soft, non-tender; non-distended, BS present  Extremities: 1+ nonpitting edema ble, warm Skin: Normal turgor and texture; no rash Psych: Appropriate affect Neuro: Alert and oriented to person and place, no focal deficit     Resolved Hospital Problem list    Assessment & Plan:   # High risk PE Depo-provera exposure a provoking factor.  - IR consulted, plan tentatively for catheter directed lytics. If sustains SBP <90 and/or requires vasopressors then we could alternatively administer peripheral lytics (tPA).  - heparin gtt - TTE - US DVT  # Mood/psychotic disorders - we are still awaiting confirmed medication list, she is unsure what she takes  # DM - SSI    Best Practice (right click and "Reselect all SmartList Selections" daily)   Diet/type: NPO w/ oral meds DVT prophylaxis: systemic heparin GI prophylaxis: N/A Lines: N/A Foley:  N/A Code Status:  full code Last date of multidisciplinary goals of care discussion [updated pt and caretaker (though not guardian) Holly [name] at bedside]  Labs   CBC: Recent Labs  Lab 09/22/22 1722 09/22/22 1802  WBC 9.5  --   NEUTROABS 6.9  --   HGB 13.4 13.9  HCT 41.6 41.0  MCV 92.4  --   PLT 244  --     Basic Metabolic Panel: Recent Labs  Lab 09/22/22 1722 09/22/22 1802  NA 137 140  K 3.6 3.8  CL 105 106  CO2 18*  --   GLUCOSE 140* 141*  BUN 7 6  CREATININE 1.10* 0.90  CALCIUM 8.9  --   MG 1.9  --    GFR: CrCl cannot be calculated (Unknown ideal weight.). Recent Labs  Lab 09/22/22 1722 09/22/22 1747  WBC 9.5  --   LATICACIDVEN  --  1.8  Liver Function Tests: Recent Labs  Lab 09/22/22 1722  AST 22  ALT 10  ALKPHOS 110  BILITOT 0.3  PROT 7.3  ALBUMIN 3.4*   No results for input(s): "LIPASE", "AMYLASE" in the last 168 hours. No results for input(s): "AMMONIA" in the last 168 hours.  ABG    Component Value Date/Time   TCO2 20 (L) 09/22/2022 1802     Coagulation Profile: No results for input(s): "INR", "PROTIME" in the last 168 hours.  Cardiac Enzymes: No results for input(s): "CKTOTAL", "CKMB", "CKMBINDEX", "TROPONINI" in the last 168 hours.  HbA1C: Hgb A1c MFr Bld  Date/Time Value Ref Range Status   08/21/2015 06:16 AM 5.6 4.8 - 5.6 % Final    Comment:    (NOTE)         Pre-diabetes: 5.7 - 6.4         Diabetes: >6.4         Glycemic control for adults with diabetes: <7.0   03/19/2014 06:29 AM 4.9 <5.7 % Final    Comment:    (NOTE)                                                                       According to the ADA Clinical Practice Recommendations for 2011, when HbA1c is used as a screening test:  >=6.5%   Diagnostic of Diabetes Mellitus           (if abnormal result is confirmed) 5.7-6.4%   Increased risk of developing Diabetes Mellitus References:Diagnosis and Classification of Diabetes Mellitus,Diabetes Care,2011,34(Suppl 1):S62-S69 and Standards of Medical Care in         Diabetes - 2011,Diabetes Care,2011,34 (Suppl 1):S11-S61.     CBG: No results for input(s): "GLUCAP" in the last 168 hours.  Review of Systems:   12 point review of systems is negative except as in HPI  Past Medical History:  She,  has a past medical history of ADHD (attention deficit hyperactivity disorder), Bipolar 1 disorder (HCC), Depression, Diabetes mellitus, Gluten free diet, Hypothyroidism, Mood disorder (HCC), MR (mental retardation), Obesity, and PTSD (post-traumatic stress disorder).   Surgical History:   Past Surgical History:  Procedure Laterality Date   NO PAST SURGERIES     None       Social History:   reports that she has never smoked. She has never used smokeless tobacco. She reports that she does not drink alcohol and does not use drugs.   Family History:  Her family history is not on file. She was adopted.   Allergies Allergies  Allergen Reactions   Gluten Meal Other (See Comments)    Upset stomach - patient denies   Lactose Intolerance (Gi) Nausea Only and Other (See Comments)    Upset stomach     Home Medications  Prior to Admission medications   Medication Sig Start Date End Date Taking? Authorizing Provider  amantadine (SYMMETREL) 100 MG capsule Take 1  capsule (100 mg total) by mouth 2 (two) times daily for 7 days. Takes 1 at 8AM and 1 at [facility] 05/09/22 05/16/22  Holly Bruins, DO  citalopram (CELEXA) 20 MG tablet Take 1 tablet (20 mg total) by mouth daily for 7 days. 05/09/22 05/16/22  Holly Bruins, DO  clindamycin (CLEOCIN) 300  MG capsule Take 1 capsule (300 mg total) by mouth 3 (three) times daily. 09/13/22   Wallis Bamberg, PA-C  FLUoxetine (PROZAC) 20 MG capsule Take 1 capsule (20 mg total) by mouth daily for 7 days. For depression 05/09/22 05/16/22  Holly Bruins, DO  levothyroxine (SYNTHROID) 100 MCG tablet Take 1 tablet (100 mcg total) by mouth daily before breakfast for 7 days. For low thyroid function 05/09/22 05/16/22  Holly Bruins, DO  linaclotide [facility]) 290 MCG CAPS capsule Take 290 mcg by mouth daily before breakfast.    [provider]  lithium carbonate (LITHOBID) 300 MG ER tablet Take 3 tablets (900 mg total) by mouth at bedtime for 7 days. For mood stabilization 05/09/22 05/16/22  Holly Bruins, DO  medroxyPROGESTERone (DEPO-PROVERA) 150 MG/ML injection Inject 150 mg into the muscle every 3 (three) months.    [provider]  metFORMIN (GLUCOPHAGE) 500 MG tablet Take 1 tablet (500 mg) at breakfast & 2 tablets (1000 mg) at supper: For diabetes 05/09/22   Holly Bruins, DO  omeprazole (PRILOSEC) 20 MG capsule Take 20 mg by mouth daily with breakfast.     [provider]  trazodone (DESYREL) 300 MG tablet Take 1 tablet (300 mg total) by mouth at bedtime for 7 days. 05/09/22 05/16/22  Holly Bruins, DO     Critical care time: 40 minutes

## 2022-09-22 NOTE — ED Triage Notes (Addendum)
Pt came in via POV d/t her PCP (Dr. Parke Simmers) seeing her for SOB that started last night & when an EKG was done in their office they noted she had new onset A-Fib RVR (per Caregiver with her). She lives in a group home & is A/Ox4 & d/t chronic issues is deemed needed for a caretaker. Heart rate 140's during Triage.

## 2022-09-22 NOTE — ED Notes (Signed)
Intensivist is at bedside.

## 2022-09-22 NOTE — ED Provider Notes (Signed)
[place] [facility] AT [facility] Provider Note   CSN: [id number] Arrival date & time: 09/22/22  1648     History  Chief Complaint  Patient presents with   Tachycardia    Holly RAGATZ is a 27 y.o. female.  HPI    27yo female with history of bipolar, depression, DM, ADHD, PTSD, who presents with concern for chest pain and dyspnea.  Symptoms started last night. Feels like heart racing, tightness type of chest pain, worse with deep breaths. Has associated shortness of breath. Treated for cellulitis 5/7 RLE and feels like it is improving. No other leg pain or swelling. Denies SI/ overdose/drug use/withdrawal or medication changes other than clindamycin.  Slight cough. No hx of heart arrhythmia. No fever, chills, abdominal pain, dysuria.     No hx of DVT/PE. Foster care and does not know family hx Last depo shot in March No recent surgeries  Past Medical History:  Diagnosis Date   ADHD (attention deficit hyperactivity disorder)    Bipolar 1 disorder (HCC)    Depression    Diabetes mellitus    Gluten free diet    Hypothyroidism    Mood disorder (HCC)    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   Obesity    PTSD (post-traumatic stress disorder)       Home Medications Prior to Admission medications   Medication Sig Start Date End Date Taking? Authorizing Provider  amantadine (SYMMETREL) 100 MG capsule Take 1 capsule (100 mg total) by mouth 2 (two) times daily for 7 days. Takes 1 at 8AM and 1 at [facility] 05/09/22 09/23/22 Yes Princess Bruins, DO  ARIPiprazole (ABILIFY) 10 MG tablet Take 10 mg by mouth daily.   Yes [provider]  clindamycin (CLEOCIN) 300 MG capsule Take 1 capsule (300 mg total) by mouth 3 (three) times daily. 09/13/22  Yes Wallis Bamberg, PA-C  levothyroxine (SYNTHROID) 100 MCG tablet Take 1 tablet (100 mcg total) by mouth daily before breakfast for 7 days. For low thyroid function Patient taking differently: Take 100 mcg by mouth daily  before breakfast. 05/09/22 09/23/22 Yes Princess Bruins, DO  linaclotide (LINZESS) 290 MCG CAPS capsule Take 290 mcg by mouth daily before breakfast.   Yes [provider]  lithium carbonate (LITHOBID) 300 MG ER tablet Take 3 tablets (900 mg total) by mouth at bedtime for 7 days. For mood stabilization Patient taking differently: Take 600 mg by mouth at bedtime. 05/09/22 09/23/22 Yes Princess Bruins, DO  metFORMIN (GLUCOPHAGE) 1000 MG tablet Take 1,000 mg by mouth at bedtime.   Yes [provider]  omeprazole (PRILOSEC) 20 MG capsule Take 20 mg by mouth daily.   Yes [provider]  traZODone (DESYREL) 150 MG tablet Take 300 mg by mouth at bedtime.   Yes [provider]  citalopram (CELEXA) 20 MG tablet Take 1 tablet (20 mg total) by mouth daily for 7 days. Patient not taking: Reported on 09/23/2022 05/09/22 09/22/22  Princess Bruins, DO  FLUoxetine (PROZAC) 20 MG capsule Take 1 capsule (20 mg total) by mouth daily for 7 days. For depression Patient not taking: Reported on 09/23/2022 05/09/22 09/22/22  Princess Bruins, DO  medroxyPROGESTERone (DEPO-PROVERA) 150 MG/ML injection Inject 150 mg into the muscle every 3 (three) months. Patient not taking: Reported on 09/23/2022    [provider]  metFORMIN (GLUCOPHAGE) 500 MG tablet Take 1 tablet (500 mg) at breakfast & 2 tablets (1000 mg) at supper: For diabetes Patient not taking: Reported  on 09/23/2022 05/09/22   Princess Bruins, DO  trazodone (DESYREL) 300 MG tablet Take 1 tablet (300 mg total) by mouth at bedtime for 7 days. Patient not taking: Reported on 09/23/2022 05/09/22 09/22/22  Princess Bruins, DO      Allergies    Patient has no active allergies.    Review of Systems   Review of Systems  Physical Exam Updated Vital Signs BP 110/79 (BP Location: Left Arm)   Pulse 99   Temp 97.6 F (36.4 C) (Oral)   Resp (!) 21   Ht 5' (1.524 m)   Wt 110 kg   SpO2 98%   BMI 47.36 kg/m  Physical Exam Vitals and nursing note  reviewed.  Constitutional:      General: She is not in acute distress.    Appearance: She is well-developed. She is not diaphoretic.  HENT:     Head: Normocephalic and atraumatic.  Eyes:     Conjunctiva/sclera: Conjunctivae normal.  Cardiovascular:     Rate and Rhythm: Regular rhythm. Tachycardia present.     Heart sounds: Normal heart sounds. No murmur heard.    No friction rub. No gallop.  Pulmonary:     Effort: Pulmonary effort is normal. No respiratory distress.     Breath sounds: Normal breath sounds. No wheezing or rales.  Abdominal:     General: There is no distension.     Palpations: Abdomen is soft.     Tenderness: There is no abdominal tenderness. There is no guarding.  Musculoskeletal:        General: Swelling (mild RLE) present. No tenderness.     Cervical back: Normal range of motion.  Skin:    General: Skin is warm and dry.     Findings: No erythema or rash.  Neurological:     Mental Status: She is alert and oriented to person, place, and time.     ED Results / Procedures / Treatments   Labs (all labs ordered are listed, but only abnormal results are displayed) Labs Reviewed  COMPREHENSIVE METABOLIC PANEL - Abnormal; Notable for the following components:      Result Value   CO2 18 (*)    Glucose, Bld 140 (*)    Creatinine, Ser 1.10 (*)    Albumin 3.4 (*)    All other components within normal limits  T4, FREE - Abnormal; Notable for the following components:   Free T4 1.40 (*)    All other components within normal limits  BRAIN NATRIURETIC PEPTIDE - Abnormal; Notable for the following components:   B Natriuretic Peptide 605.5 (*)    All other components within normal limits  HEPARIN LEVEL (UNFRACTIONATED) - Abnormal; Notable for the following components:   Heparin Unfractionated 1.08 (*)    All other components within normal limits  CBC - Abnormal; Notable for the following components:   RBC 3.64 (*)    Hemoglobin 10.9 (*)    HCT 33.0 (*)    All  other components within normal limits  BASIC METABOLIC PANEL - Abnormal; Notable for the following components:   Potassium 3.3 (*)    CO2 20 (*)    Glucose, Bld 121 (*)    Calcium 8.1 (*)    All other components within normal limits  APTT - Abnormal; Notable for the following components:   aPTT 189 (*)    All other components within normal limits  PROTIME-INR - Abnormal; Notable for the following components:   Prothrombin Time 15.3 (*)  All other components within normal limits  GLUCOSE, CAPILLARY - Abnormal; Notable for the following components:   Glucose-Capillary 115 (*)    All other components within normal limits  APTT - Abnormal; Notable for the following components:   aPTT 82 (*)    All other components within normal limits  GLUCOSE, CAPILLARY - Abnormal; Notable for the following components:   Glucose-Capillary 103 (*)    All other components within normal limits  GLUCOSE, CAPILLARY - Abnormal; Notable for the following components:   Glucose-Capillary 117 (*)    All other components within normal limits  GLUCOSE, CAPILLARY - Abnormal; Notable for the following components:   Glucose-Capillary 128 (*)    All other components within normal limits  I-STAT CHEM 8, ED - Abnormal; Notable for the following components:   Glucose, Bld 141 (*)    Calcium, Ion 1.09 (*)    TCO2 20 (*)    All other components within normal limits  TROPONIN I (HIGH SENSITIVITY) - Abnormal; Notable for the following components:   Troponin I (High Sensitivity) 144 (*)    All other components within normal limits  TROPONIN I (HIGH SENSITIVITY) - Abnormal; Notable for the following components:   Troponin I (High Sensitivity) 158 (*)    All other components within normal limits  MRSA NEXT GEN BY PCR, NASAL  CBC WITH DIFFERENTIAL/PLATELET  LACTIC ACID, PLASMA  LACTIC ACID, PLASMA  TSH  MAGNESIUM  HIV ANTIBODY (ROUTINE TESTING W REFLEX)  HEMOGLOBIN A1C  HEPARIN LEVEL (UNFRACTIONATED)  GLUCOSE,  CAPILLARY  RAPID URINE DRUG SCREEN, HOSP PERFORMED  HEPARIN LEVEL (UNFRACTIONATED)  I-STAT BETA HCG BLOOD, ED ([facility], [facility], [facility] ONLY)  SURGICAL PATHOLOGY    EKG EKG Interpretation  Date/Time:  Thursday Sep 22 2022 17:08:01 EDT Ventricular Rate:  148 PR Interval:  118 QRS Duration: 86 QT Interval:  342 QTC Calculation: 536 R Axis:   111 Text Interpretation: Sinus tachycardia with occasional Premature ventricular complexes Right axis deviation ST & T wave abnormality, consider inferior ischemia ST & T wave abnormality, consider anterolateral ischemia Abnormal ECG When compared with ECG of 17-Dec-2015 11:18, PREVIOUS ECG IS PRESENT Rate has increased Confirmed by Alvira Monday ([id number]) on 09/22/2022 5:52:02 PM  Radiology VAS [place] LOWER EXTREMITY VENOUS (DVT)  Result Date: 09/23/2022  Lower Venous DVT Study Patient Name:  SUNIYA HAUGHEY Andress  Date of Exam:   09/23/2022 Medical Rec #: [id number]          Accession #:    [id number] Date of Birth: Dec 30, 1995          Patient Gender: F Patient Age:   2 years Exam Location:  [facility] Procedure:      VAS [place] LOWER EXTREMITY VENOUS (DVT) Referring Phys: Felisa Bonier --------------------------------------------------------------------------------  Indications: Pulmonary embolism, and S/P thrombectomy for pulmonary embolis on 09/22/22.  Anticoagulation: Heparin. Performing Technologist: Marilynne Halsted RDMS, RVT  Examination Guidelines: A complete evaluation includes B-mode imaging, spectral Doppler, color Doppler, and power Doppler as needed of all accessible portions of each vessel. Bilateral testing is considered an integral part of a complete examination. Limited examinations for reoccurring indications may be performed as noted. The reflux portion of the exam is performed with the patient in reverse Trendelenburg.  +---------+---------------+---------+-----------+----------+-----------------+ RIGHT     CompressibilityPhasicitySpontaneityPropertiesThrombus Aging    +---------+---------------+---------+-----------+----------+-----------------+ CFV      Full           Yes      Yes                                    +---------+---------------+---------+-----------+----------+-----------------+  SFJ      Full                                                           +---------+---------------+---------+-----------+----------+-----------------+ FV Prox  Full                                                           +---------+---------------+---------+-----------+----------+-----------------+ FV Mid   Full                                                           +---------+---------------+---------+-----------+----------+-----------------+ FV DistalFull                                                           +---------+---------------+---------+-----------+----------+-----------------+ PFV      Full                                                           +---------+---------------+---------+-----------+----------+-----------------+ POP      None           No       No                   Age Indeterminate +---------+---------------+---------+-----------+----------+-----------------+ PTV      Partial                                      Age Indeterminate +---------+---------------+---------+-----------+----------+-----------------+ PERO     None                                         Age Indeterminate +---------+---------------+---------+-----------+----------+-----------------+   +---------+---------------+---------+-----------+----------+--------------+ LEFT     CompressibilityPhasicitySpontaneityPropertiesThrombus Aging +---------+---------------+---------+-----------+----------+--------------+ CFV      Full           Yes      Yes                                  +---------+---------------+---------+-----------+----------+--------------+ SFJ      Full                                                        +---------+---------------+---------+-----------+----------+--------------+ FV Prox  Full                                                        +---------+---------------+---------+-----------+----------+--------------+  FV Mid   Full                                                        +---------+---------------+---------+-----------+----------+--------------+ FV DistalFull                                                        +---------+---------------+---------+-----------+----------+--------------+ PFV      Full                                                        +---------+---------------+---------+-----------+----------+--------------+ POP      Full           Yes      Yes                                 +---------+---------------+---------+-----------+----------+--------------+ PTV      Full                                                        +---------+---------------+---------+-----------+----------+--------------+ PERO     Full                                                        +---------+---------------+---------+-----------+----------+--------------+    Summary: RIGHT: - Findings consistent with age indeterminate deep vein thrombosis involving the right popliteal vein, right posterior tibial veins, and right peroneal veins. - No cystic structure found in the popliteal fossa.  LEFT: - There is no evidence of deep vein thrombosis in the lower extremity.  - No cystic structure found in the popliteal fossa.  *See table(s) above for measurements and observations.    Preliminary    CT Angio Chest PE W and/or Wo Contrast  Result Date: 09/22/2022 CLINICAL DATA:  High clinical suspicion for PE EXAM: CT ANGIOGRAPHY CHEST WITH CONTRAST TECHNIQUE: Multidetector CT imaging of the chest was performed  using the standard protocol during bolus administration of intravenous contrast. Multiplanar CT image reconstructions and MIPs were obtained to evaluate the vascular anatomy. RADIATION DOSE REDUCTION: This exam was performed according to the departmental dose-optimization program which includes automated exposure control, adjustment of the mA and/or kV according to patient size and/or use of iterative reconstruction technique. CONTRAST:  58mL OMNIPAQUE IOHEXOL 350 MG/ML SOLN COMPARISON:  Chest radiograph done earlier today FINDINGS: Cardiovascular: There is homogeneous enhancement in thoracic aorta. There are intraluminal filling defects in lobar, segmental and subsegmental branches in both lungs. There is moderate to large thrombus burden. There is no demonstrable saddle embolus in the main pulmonary arteries. There is dilation of right ventricular cavity. RV LV ratio is  1.7. Main pulmonary artery measures 3.5 cm in diameter suggesting possible pulmonary arterial hypertension. Mediastinum/Nodes: Small pericardial effusion is present. Lungs/Pleura: There are few scattered ground-glass densities in both lungs, more so in the left lower lung field. There is no focal consolidation. There is no pleural effusion or pneumothorax. Upper Abdomen: No acute findings are seen. Musculoskeletal: Unremarkable. Review of the MIP images confirms the above findings. IMPRESSION: Acute pulmonary embolism with moderate to large thrombus burden. Filling defects are seen in lobar, segmental and subsegmental branches in both lungs. RV LV ratio is 1.7 suggesting right ventricular strain. There is prominence of main pulmonary artery suggesting possible pulmonary arterial hypertension. There small scattered faint ground-glass densities in the lower lung fields, more so on the left side suggesting scarring or interstitial pneumonia or early changes of pulmonary infarction. There is no pleural effusion or pneumothorax. Imaging findings were  relayed to patient's provider Dr. Dalene Seltzer by telephone call. Electronically Signed   By: Ernie Avena M.D.   On: 09/22/2022 19:48   DG Chest Portable 1 View  Result Date: 09/22/2022 CLINICAL DATA:  Chest pressure pain EXAM: PORTABLE CHEST 1 VIEW COMPARISON:  08/18/2015 FINDINGS: The heart size and mediastinal contours are within normal limits. Both lungs are clear. The visualized skeletal structures are unremarkable. IMPRESSION: No active disease. Electronically Signed   By: Jasmine Pang M.D.   On: 09/22/2022 18:06    Procedures .Critical Care  Performed by: Alvira Monday, MD Authorized by: Alvira Monday, MD   Critical care provider statement:    Critical care time (minutes):  30   Critical care was time spent personally by me on the following activities:  Development of treatment plan with patient or surrogate, discussions with consultants, evaluation of patient's response to treatment, examination of patient, ordering and review of laboratory studies, ordering and review of radiographic studies, ordering and performing treatments and interventions, pulse oximetry, re-evaluation of patient's condition and review of old charts     Medications Ordered in ED Medications  heparin ADULT infusion 100 units/mL (25000 units/268mL) (1,200 Units/hr Intravenous Infusion Verify 09/23/22 0400)  docusate sodium (COLACE) capsule 100 mg (has no administration in time range)  polyethylene glycol (MIRALAX / GLYCOLAX) packet 17 g (has no administration in time range)  insulin aspart (novoLOG) injection 0-15 Units ( Subcutaneous Not Given 09/23/22 0836)  Chlorhexidine Gluconate Cloth 2 % PADS 6 each (6 each Topical Given 09/23/22 0849)  potassium chloride 10 mEq in 100 mL IVPB (10 mEq Intravenous Not Given 09/23/22 0836)  acetaminophen (TYLENOL) tablet 650 mg (650 mg Oral Given 09/23/22 0359)  Oral care mouth rinse (has no administration in time range)  perflutren lipid microspheres (DEFINITY) IV  suspension (2 mLs Intravenous Given 09/23/22 0909)  levothyroxine (SYNTHROID) tablet 100 mcg (has no administration in time range)  metFORMIN (GLUCOPHAGE) tablet 1,000 mg (has no administration in time range)  pantoprazole (PROTONIX) EC tablet 40 mg (has no administration in time range)  ARIPiprazole (ABILIFY) tablet 10 mg (has no administration in time range)  amantadine (SYMMETREL) capsule 100 mg (has no administration in time range)  lithium carbonate capsule 600 mg (has no administration in time range)  traZODone (DESYREL) tablet 300 mg (has no administration in time range)  lactated ringers bolus 1,000 mL (0 mLs Intravenous Stopped 09/22/22 1859)  iohexol (OMNIPAQUE) 350 MG/ML injection 58 mL (58 mLs Intravenous Contrast Given 09/22/22 1929)  heparin bolus via infusion 5,000 Units (5,000 Units Intravenous Bolus from Bag 09/22/22 2043)  iohexol (OMNIPAQUE) 300 MG/ML solution  100 mL (40 mLs Intra-arterial Contrast Given 09/23/22 0052)  fentaNYL (SUBLIMAZE) injection (25 mcg Intravenous Given 09/22/22 2334)  midazolam (VERSED) injection (0.5 mg Intravenous Given 09/22/22 2333)  diphenhydrAMINE (BENADRYL) injection (12.5 mg Intravenous Given 09/22/22 2316)  heparin sodium (porcine) injection (3,000 Units Intravenous Given 09/22/22 2351)  iohexol (OMNIPAQUE) 300 MG/ML solution 100 mL (80 mLs Intravenous Contrast Given 09/23/22 0031)  potassium chloride SA (KLOR-CON M) CR tablet 20 mEq (20 mEq Oral Given 09/23/22 1610)    ED Course/ Medical Decision Making/ A&P                              27yo female with history of bipolar, depression, DM, ADHD, PTSD, who presents with concern for chest pain and dyspnea.   DDx includes PE, cardiac arrhythmia, ACS, myocarditis, toxic/metabolic, thyroid storm.  EKG evaluated by me shows sinus tachycardia with diffuse ST changes.   Labs significant for elevated troponin, BNP suspect likely secondary to heart strain.  Labs completed and personally evaluated by me  and radiology otherwise without significant abnormalities.  CT personally evaluated by me and radiology shows acute PE with moderate to large thrombu burden, filling defects in lobar, segmental and subsegmental branches suggesting right ventricular strain.  Given these findings, troponin elevation, tachycardia, blood pressures 90s systolic consulted pulmonology, who consulted IR for catheter directed therapy/thrombectomy.  Heparin gtt ordered.         Final Clinical Impression(s) / ED Diagnoses Final diagnoses:  Other acute pulmonary embolism with acute cor pulmonale (HCC)  Sinus tachycardia  Elevated troponin    Rx / DC Orders ED Discharge Orders     None         Alvira Monday, MD 09/23/22 1216

## 2022-09-22 NOTE — Progress Notes (Signed)
ANTICOAGULATION CONSULT NOTE - Initial Consult  Pharmacy Consult for heparin Indication: pulmonary embolus  Allergies  Allergen Reactions   Gluten Meal Other (See Comments)    Upset stomach - patient denies   Lactose Intolerance (Gi) Nausea Only and Other (See Comments)    Upset stomach    Patient Measurements:   Heparin Dosing Weight: 72.8 kg  Vital Signs: Temp: 97.5 F (36.4 C) (05/16 1655) BP: 99/67 (05/16 1835) Pulse Rate: 124 (05/16 1835)  Labs: Recent Labs    09/22/22 1722 09/22/22 1802  HGB 13.4 13.9  HCT 41.6 41.0  PLT 244  --   CREATININE 1.10* 0.90  TROPONINIHS 144*  --     CrCl cannot be calculated (Unknown ideal weight.).   Medical History: Past Medical History:  Diagnosis Date   ADHD (attention deficit hyperactivity disorder)    Bipolar 1 disorder (HCC)    Depression    Diabetes mellitus    Gluten free diet    Hypothyroidism    Mood disorder (HCC)    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   Obesity    PTSD (post-traumatic stress disorder)     Medications:  (Not in a hospital admission)  Scheduled:  Infusions:   Assessment: Presented with SOB that started 5/15 PM with new onset AF RVR and HR in 140s. On Depo-Provera PTA. CTA showed acute PE with moderate to large thrombus burden with RV strain.  Hgb 13.9, PLTc 244K, troponins mildly elevated.  Goal of Therapy:  Heparin level 0.3-0.7 units/ml Monitor platelets by anticoagulation protocol: Yes   Plan:  Bolus 5000 units of heparin Start heparin at 1200 units/hr Heparin level in 6 hours Daily heparin level and CBC F/u CCM plans for further intervention  Thank you for involving pharmacy in this patient's care.  Enos Fling, PharmD PGY2 Pharmacy Resident 09/22/2022 8:02 PM

## 2022-09-23 ENCOUNTER — Inpatient Hospital Stay (HOSPITAL_COMMUNITY): Payer: Medicaid Other

## 2022-09-23 ENCOUNTER — Other Ambulatory Visit (HOSPITAL_COMMUNITY): Payer: Self-pay

## 2022-09-23 DIAGNOSIS — Z86711 Personal history of pulmonary embolism: Secondary | ICD-10-CM

## 2022-09-23 DIAGNOSIS — I2602 Saddle embolus of pulmonary artery with acute cor pulmonale: Secondary | ICD-10-CM | POA: Diagnosis not present

## 2022-09-23 DIAGNOSIS — I2693 Single subsegmental pulmonary embolism without acute cor pulmonale: Secondary | ICD-10-CM | POA: Diagnosis not present

## 2022-09-23 HISTORY — PX: IR US GUIDE VASC ACCESS RIGHT: IMG2390

## 2022-09-23 HISTORY — PX: IR THROMBECT PRIM MECH INIT (INCLU) MOD SED: IMG2297

## 2022-09-23 HISTORY — PX: IR ANGIOGRAM PULMONARY BILATERAL SELECTIVE: IMG664

## 2022-09-23 LAB — GLUCOSE, CAPILLARY
Glucose-Capillary: 117 mg/dL — ABNORMAL HIGH (ref 70–99)
Glucose-Capillary: 115 mg/dL — ABNORMAL HIGH (ref 70–99)
Glucose-Capillary: 103 mg/dL — ABNORMAL HIGH (ref 70–99)
Glucose-Capillary: 99 mg/dL (ref 70–99)
Glucose-Capillary: 128 mg/dL — ABNORMAL HIGH (ref 70–99)
Glucose-Capillary: 120 mg/dL — ABNORMAL HIGH (ref 70–99)
Glucose-Capillary: 110 mg/dL — ABNORMAL HIGH (ref 70–99)

## 2022-09-23 LAB — HEPARIN LEVEL (UNFRACTIONATED)
Heparin Unfractionated: 1.08 IU/mL — ABNORMAL HIGH (ref 0.30–0.70)
Heparin Unfractionated: 0.68 IU/mL (ref 0.30–0.70)
Heparin Unfractionated: 0.25 IU/mL — ABNORMAL LOW (ref 0.30–0.70)
Heparin Unfractionated: 0.23 IU/mL — ABNORMAL LOW (ref 0.30–0.70)

## 2022-09-23 LAB — HIV ANTIBODY (ROUTINE TESTING W REFLEX): HIV Screen 4th Generation wRfx: NONREACTIVE

## 2022-09-23 LAB — BASIC METABOLIC PANEL
Anion gap: 11 (ref 5–15)
BUN: 7 mg/dL (ref 6–20)
CO2: 20 mmol/L — ABNORMAL LOW (ref 22–32)
Calcium: 8.1 mg/dL — ABNORMAL LOW (ref 8.9–10.3)
Chloride: 106 mmol/L (ref 98–111)
Creatinine, Ser: 0.89 mg/dL (ref 0.44–1.00)
GFR, Estimated: >60 mL/min (ref >60)
Glucose, Bld: 121 mg/dL — ABNORMAL HIGH (ref 70–99)
Potassium: 3.3 mmol/L — ABNORMAL LOW (ref 3.5–5.1)
Sodium: 137 mmol/L (ref 135–145)

## 2022-09-23 LAB — CBC
HCT: 33 % — ABNORMAL LOW (ref 36.0–46.0)
Hemoglobin: 10.9 g/dL — ABNORMAL LOW (ref 12.0–15.0)
MCH: 29.9 pg (ref 26.0–34.0)
MCHC: 33 g/dL (ref 30.0–36.0)
MCV: 90.7 fL (ref 80.0–100.0)
Platelets: 174 10*3/uL (ref 150–400)
RBC: 3.64 MIL/uL — ABNORMAL LOW (ref 3.87–5.11)
RDW: 13.2 % (ref 11.5–15.5)
WBC: 10.4 10*3/uL (ref 4.0–10.5)
nRBC: 0 % (ref 0.0–0.2)

## 2022-09-23 LAB — ECHOCARDIOGRAM COMPLETE
Height: 60 in
S' Lateral: 3.3 cm
Weight: 3880.1 oz

## 2022-09-23 LAB — HEMOGLOBIN A1C
Hgb A1c MFr Bld: 5.2 % (ref 4.8–5.6)
Mean Plasma Glucose: 102.54 mg/dL

## 2022-09-23 LAB — APTT
aPTT: 189 seconds (ref 24–36)
aPTT: 82 seconds — ABNORMAL HIGH (ref 24–36)

## 2022-09-23 LAB — PROTIME-INR
INR: 1.2 (ref 0.8–1.2)
Prothrombin Time: 15.3 seconds — ABNORMAL HIGH (ref 11.4–15.2)

## 2022-09-23 MED ORDER — ACETAMINOPHEN 325 MG PO TABS
650.0000 mg | ORAL_TABLET | ORAL | Status: DC | PRN
  Administered 2022-09-23 – 2022-09-25 (×2): 650 mg via ORAL
  Filled 2022-09-23 (×2): qty 2

## 2022-09-23 MED ORDER — LITHIUM CARBONATE 300 MG PO CAPS
600.0000 mg | ORAL_CAPSULE | Freq: Every day | ORAL | Status: DC
  Administered 2022-09-23 – 2022-09-26 (×4): 600 mg via ORAL
  Filled 2022-09-23 (×5): qty 2

## 2022-09-23 MED ORDER — POTASSIUM CHLORIDE CRYS ER 20 MEQ PO TBCR
20.0000 meq | EXTENDED_RELEASE_TABLET | ORAL | Status: AC
  Administered 2022-09-23 (×2): 20 meq via ORAL
  Filled 2022-09-23 (×2): qty 1

## 2022-09-23 MED ORDER — IOHEXOL 300 MG/ML  SOLN
100.0000 mL | Freq: Once | INTRAMUSCULAR | Status: AC | PRN
  Administered 2022-09-23: 80 mL via INTRAVENOUS

## 2022-09-23 MED ORDER — METFORMIN HCL 500 MG PO TABS
1000.0000 mg | ORAL_TABLET | Freq: Every day | ORAL | Status: DC
  Administered 2022-09-25 – 2022-09-26 (×2): 1000 mg via ORAL
  Filled 2022-09-23 (×2): qty 2

## 2022-09-23 MED ORDER — PERFLUTREN LIPID MICROSPHERE
1.0000 mL | INTRAVENOUS | Status: AC | PRN
  Administered 2022-09-23: 2 mL via INTRAVENOUS

## 2022-09-23 MED ORDER — HEPARIN BOLUS VIA INFUSION
1000.0000 [IU] | Freq: Once | INTRAVENOUS | Status: AC
  Administered 2022-09-23: 1000 [IU] via INTRAVENOUS
  Filled 2022-09-23: qty 1000

## 2022-09-23 MED ORDER — TRAZODONE HCL 50 MG PO TABS: 150.0000 mg | ORAL_TABLET | Freq: Two times a day (BID) | ORAL | Status: DC

## 2022-09-23 MED ORDER — PANTOPRAZOLE SODIUM 40 MG PO TBEC
40.0000 mg | DELAYED_RELEASE_TABLET | Freq: Every day | ORAL | Status: DC
  Administered 2022-09-23 – 2022-09-27 (×5): 40 mg via ORAL
  Filled 2022-09-23 (×5): qty 1

## 2022-09-23 MED ORDER — TRAZODONE HCL 100 MG PO TABS
300.0000 mg | ORAL_TABLET | Freq: Every day | ORAL | Status: DC
  Administered 2022-09-23 – 2022-09-26 (×4): 300 mg via ORAL
  Filled 2022-09-23 (×4): qty 3
  Filled 2022-09-23: qty 6

## 2022-09-23 MED ORDER — ORAL CARE MOUTH RINSE: 15.0000 mL | OROMUCOSAL | Status: DC | PRN

## 2022-09-23 MED ORDER — ARIPIPRAZOLE 10 MG PO TABS
10.0000 mg | ORAL_TABLET | Freq: Every day | ORAL | Status: DC
  Filled 2022-09-23: qty 1

## 2022-09-23 MED ORDER — ARIPIPRAZOLE 5 MG PO TABS
10.0000 mg | ORAL_TABLET | Freq: Every day | ORAL | Status: DC
  Administered 2022-09-23 – 2022-09-26 (×4): 10 mg via ORAL
  Filled 2022-09-23: qty 2
  Filled 2022-09-23: qty 1
  Filled 2022-09-23 (×2): qty 2

## 2022-09-23 MED ORDER — LITHIUM CARBONATE 300 MG PO CAPS: 900.0000 mg | ORAL_CAPSULE | Freq: Two times a day (BID) | ORAL | Status: DC

## 2022-09-23 MED ORDER — POTASSIUM CHLORIDE 10 MEQ/100ML IV SOLN
10.0000 meq | INTRAVENOUS | Status: AC
  Administered 2022-09-23 (×3): 10 meq via INTRAVENOUS
  Filled 2022-09-23 (×3): qty 100

## 2022-09-23 MED ORDER — HEPARIN BOLUS VIA INFUSION
1100.0000 [IU] | Freq: Once | INTRAVENOUS | Status: AC
  Administered 2022-09-23: 1100 [IU] via INTRAVENOUS
  Filled 2022-09-23: qty 1100

## 2022-09-23 MED ORDER — AMANTADINE HCL 100 MG PO CAPS
100.0000 mg | ORAL_CAPSULE | Freq: Two times a day (BID) | ORAL | Status: DC
  Administered 2022-09-23 – 2022-09-27 (×8): 100 mg via ORAL
  Filled 2022-09-23 (×10): qty 1

## 2022-09-23 MED ORDER — AMANTADINE HCL 100 MG PO CAPS
100.0000 mg | ORAL_CAPSULE | Freq: Two times a day (BID) | ORAL | Status: DC
  Filled 2022-09-23: qty 1

## 2022-09-23 MED ORDER — LEVOTHYROXINE SODIUM 100 MCG PO TABS
100.0000 ug | ORAL_TABLET | Freq: Every day | ORAL | Status: DC
  Administered 2022-09-23 – 2022-09-27 (×5): 100 ug via ORAL
  Filled 2022-09-23 (×5): qty 1

## 2022-09-23 NOTE — Progress Notes (Signed)
eLink Physician-Brief Progress Note Patient Name: Holly [name] DOB: 04/26/1996 MRN: [id number]   Date of Service  09/23/2022  HPI/Events of Note  aPTT 169  eICU Interventions  Test repeated to verify result.        Graceanna Theissen U Saraann Enneking 09/23/2022, 3:10 AM

## 2022-09-23 NOTE — Procedures (Deleted)
Referring Physician(s): PCCM; Felisa Bonier  Supervising Physician: Marliss Coots  Patient Status:  [name] - In-pt  Chief Complaint:  Dyspnea, PE S/p thrombectomy by Dr. Milford Cage on 5/16   Subjective:  Patient seen with Dr. Milford Cage.  She is laying in bed, NAD, sleeping.  States that her breathing is much better, she was told that she may be transfer out of ICU today.   Allergies: Patient has no active allergies.  Medications: Prior to Admission medications   Medication Sig Start Date End Date Taking? Authorizing Provider  amantadine (SYMMETREL) 100 MG capsule Take 1 capsule (100 mg total) by mouth 2 (two) times daily for 7 days. Takes 1 at 8AM and 1 at [facility] 05/09/22 09/23/22 Yes Princess Bruins, DO  ARIPiprazole (ABILIFY) 10 MG tablet Take 10 mg by mouth daily.   Yes [provider]  clindamycin (CLEOCIN) 300 MG capsule Take 1 capsule (300 mg total) by mouth 3 (three) times daily. 09/13/22  Yes Wallis Bamberg, PA-C  levothyroxine (SYNTHROID) 100 MCG tablet Take 1 tablet (100 mcg total) by mouth daily before breakfast for 7 days. For low thyroid function Patient taking differently: Take 100 mcg by mouth daily before breakfast. 05/09/22 09/23/22 Yes Princess Bruins, DO  linaclotide (LINZESS) 290 MCG CAPS capsule Take 290 mcg by mouth daily before breakfast.   Yes [provider]  lithium carbonate (LITHOBID) 300 MG ER tablet Take 3 tablets (900 mg total) by mouth at bedtime for 7 days. For mood stabilization Patient taking differently: Take 600 mg by mouth at bedtime. 05/09/22 09/23/22 Yes Princess Bruins, DO  metFORMIN (GLUCOPHAGE) 1000 MG tablet Take 1,000 mg by mouth at bedtime.   Yes [provider]  omeprazole (PRILOSEC) 20 MG capsule Take 20 mg by mouth daily.   Yes [provider]  traZODone (DESYREL) 150 MG tablet Take 300 mg by mouth at bedtime.   Yes [provider]  citalopram (CELEXA) 20 MG tablet Take 1 tablet (20 mg total) by mouth daily for  7 days. Patient not taking: Reported on 09/23/2022 05/09/22 09/22/22  Princess Bruins, DO  FLUoxetine (PROZAC) 20 MG capsule Take 1 capsule (20 mg total) by mouth daily for 7 days. For depression Patient not taking: Reported on 09/23/2022 05/09/22 09/22/22  Princess Bruins, DO  medroxyPROGESTERone (DEPO-PROVERA) 150 MG/ML injection Inject 150 mg into the muscle every 3 (three) months. Patient not taking: Reported on 09/23/2022    [provider]  metFORMIN (GLUCOPHAGE) 500 MG tablet Take 1 tablet (500 mg) at breakfast & 2 tablets (1000 mg) at supper: For diabetes Patient not taking: Reported on 09/23/2022 05/09/22   Princess Bruins, DO  trazodone (DESYREL) 300 MG tablet Take 1 tablet (300 mg total) by mouth at bedtime for 7 days. Patient not taking: Reported on 09/23/2022 05/09/22 09/22/22  Princess Bruins, DO     Vital Signs: BP 109/64 (BP Location: Right Arm)   Pulse (!) 109   Temp 97.6 F (36.4 C) (Oral)   Resp 19   Ht 5' (1.524 m)   Wt 242 lb 8.1 oz (110 kg)   SpO2 98%   BMI 47.36 kg/m   Physical Exam Vitals reviewed.  Constitutional:      General: She is not in acute distress.    Appearance: She is not ill-appearing.  HENT:     Head: Normocephalic.  Cardiovascular:     Rate and Rhythm: Tachycardia present.  Pulmonary:     Effort: Pulmonary effort is normal.  Musculoskeletal:  Cervical back: Neck supple.  Skin:    General: Skin is warm and dry.     Coloration: Skin is not jaundiced or pale.     Comments: Purse string suture in right CFV puncture site. Site is clean and dry, no active bleeding. Mildly tender to touch, site soft.   Neurological:     Mental Status: She is alert and oriented to person, place, and time.  Psychiatric:        Mood and Affect: Mood normal.        Behavior: Behavior normal.     Imaging: VAS [place] LOWER EXTREMITY VENOUS (DVT)  Result Date: 09/23/2022  Lower Venous DVT Study Patient Name:  Holly [name]  Date of Exam:   09/23/2022 Medical Rec #:  [id number]          Accession #:    [id number] Date of Birth: 1995-12-03          Patient Gender: F Patient Age:   27 years Exam Location:  [facility] Procedure:      VAS [place] LOWER EXTREMITY VENOUS (DVT) Referring Phys: Felisa Bonier --------------------------------------------------------------------------------  Indications: Pulmonary embolism, and S/P thrombectomy for pulmonary embolis on 09/22/22.  Anticoagulation: Heparin. Performing Technologist: Marilynne Halsted RDMS, RVT  Examination Guidelines: A complete evaluation includes B-mode imaging, spectral Doppler, color Doppler, and power Doppler as needed of all accessible portions of each vessel. Bilateral testing is considered an integral part of a complete examination. Limited examinations for reoccurring indications may be performed as noted. The reflux portion of the exam is performed with the patient in reverse Trendelenburg.  +---------+---------------+---------+-----------+----------+-----------------+ RIGHT    CompressibilityPhasicitySpontaneityPropertiesThrombus Aging    +---------+---------------+---------+-----------+----------+-----------------+ CFV      Full           Yes      Yes                                    +---------+---------------+---------+-----------+----------+-----------------+ SFJ      Full                                                           +---------+---------------+---------+-----------+----------+-----------------+ FV Prox  Full                                                           +---------+---------------+---------+-----------+----------+-----------------+ FV Mid   Full                                                           +---------+---------------+---------+-----------+----------+-----------------+ FV DistalFull                                                           +---------+---------------+---------+-----------+----------+-----------------+  PFV      Full                                                            +---------+---------------+---------+-----------+----------+-----------------+ POP      None           No       No                   Age Indeterminate +---------+---------------+---------+-----------+----------+-----------------+ PTV      Partial                                      Age Indeterminate +---------+---------------+---------+-----------+----------+-----------------+ PERO     None                                         Age Indeterminate +---------+---------------+---------+-----------+----------+-----------------+   +---------+---------------+---------+-----------+----------+--------------+ LEFT     CompressibilityPhasicitySpontaneityPropertiesThrombus Aging +---------+---------------+---------+-----------+----------+--------------+ CFV      Full           Yes      Yes                                 +---------+---------------+---------+-----------+----------+--------------+ SFJ      Full                                                        +---------+---------------+---------+-----------+----------+--------------+ FV Prox  Full                                                        +---------+---------------+---------+-----------+----------+--------------+ FV Mid   Full                                                        +---------+---------------+---------+-----------+----------+--------------+ FV DistalFull                                                        +---------+---------------+---------+-----------+----------+--------------+ PFV      Full                                                        +---------+---------------+---------+-----------+----------+--------------+ POP      Full           Yes  Yes                                 +---------+---------------+---------+-----------+----------+--------------+ PTV      Full                                                         +---------+---------------+---------+-----------+----------+--------------+ PERO     Full                                                        +---------+---------------+---------+-----------+----------+--------------+    Summary: RIGHT: - Findings consistent with age indeterminate deep vein thrombosis involving the right popliteal vein, right posterior tibial veins, and right peroneal veins. - No cystic structure found in the popliteal fossa.  LEFT: - There is no evidence of deep vein thrombosis in the lower extremity.  - No cystic structure found in the popliteal fossa.  *See table(s) above for measurements and observations.    Preliminary    CT Angio Chest PE W and/or Wo Contrast  Result Date: 09/22/2022 CLINICAL DATA:  High clinical suspicion for PE EXAM: CT ANGIOGRAPHY CHEST WITH CONTRAST TECHNIQUE: Multidetector CT imaging of the chest was performed using the standard protocol during bolus administration of intravenous contrast. Multiplanar CT image reconstructions and MIPs were obtained to evaluate the vascular anatomy. RADIATION DOSE REDUCTION: This exam was performed according to the departmental dose-optimization program which includes automated exposure control, adjustment of the mA and/or kV according to patient size and/or use of iterative reconstruction technique. CONTRAST:  58mL OMNIPAQUE IOHEXOL 350 MG/ML SOLN COMPARISON:  Chest radiograph done earlier today FINDINGS: Cardiovascular: There is homogeneous enhancement in thoracic aorta. There are intraluminal filling defects in lobar, segmental and subsegmental branches in both lungs. There is moderate to large thrombus burden. There is no demonstrable saddle embolus in the main pulmonary arteries. There is dilation of right ventricular cavity. RV LV ratio is 1.7. Main pulmonary artery measures 3.5 cm in diameter suggesting possible pulmonary arterial hypertension. Mediastinum/Nodes: Small pericardial  effusion is present. Lungs/Pleura: There are few scattered ground-glass densities in both lungs, more so in the left lower lung field. There is no focal consolidation. There is no pleural effusion or pneumothorax. Upper Abdomen: No acute findings are seen. Musculoskeletal: Unremarkable. Review of the MIP images confirms the above findings. IMPRESSION: Acute pulmonary embolism with moderate to large thrombus burden. Filling defects are seen in lobar, segmental and subsegmental branches in both lungs. RV LV ratio is 1.7 suggesting right ventricular strain. There is prominence of main pulmonary artery suggesting possible pulmonary arterial hypertension. There small scattered faint ground-glass densities in the lower lung fields, more so on the left side suggesting scarring or interstitial pneumonia or early changes of pulmonary infarction. There is no pleural effusion or pneumothorax. Imaging findings were relayed to patient's provider Dr. Dalene Seltzer by telephone call. Electronically Signed   By: Ernie Avena M.D.   On: 09/22/2022 19:48   DG Chest Portable 1 View  Result Date: 09/22/2022 CLINICAL DATA:  Chest pressure pain EXAM: PORTABLE CHEST 1 VIEW COMPARISON:  08/18/2015 FINDINGS: The heart size and mediastinal  contours are within normal limits. Both lungs are clear. The visualized skeletal structures are unremarkable. IMPRESSION: No active disease. Electronically Signed   By: Jasmine Pang M.D.   On: 09/22/2022 18:06    Labs:  CBC: Recent Labs    09/22/22 1722 09/22/22 1802 09/23/22 0154  WBC 9.5  --  10.4  HGB 13.4 13.9 10.9*  HCT 41.6 41.0 33.0*  PLT 244  --  174     COAGS: Recent Labs    09/23/22 0154 09/23/22 0417  INR 1.2  --   APTT 189* 82*     BMP: Recent Labs    09/22/22 1722 09/22/22 1802 09/23/22 0154  NA 137 140 137  K 3.6 3.8 3.3*  CL 105 106 106  CO2 18*  --  20*  GLUCOSE 140* 141* 121*  BUN 7 6 7   CALCIUM 8.9  --  8.1*  CREATININE 1.10* 0.90 0.89   GFRNONAA >60  --  >60     LIVER FUNCTION TESTS: Recent Labs    09/22/22 1722  BILITOT 0.3  AST 22  ALT 10  ALKPHOS 110  PROT 7.3  ALBUMIN 3.4*     Assessment and Plan:  27 y.o. 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 Milford Cage on 09/22/22.   Remains tachycardic, BP stable, breathing 98 % on 2 L  Purse string suture removed w/o difficulty, dressing placed.  Patient was instructed to keep the R CFV site dry and clean until full heals, no submerge for 7 days, no lifting more than 10 pounds for 7 days.   Further treatment plan per PCCM Appreciate and agree with the plan.  Please call IR for questions and concerns.    Electronically Signed: Willette Brace, PA-C 09/23/2022, 2:07 PM   I spent a total of 15 Minutes at the the patient's bedside AND on the patient's hospital floor or unit, greater than 50% of which was counseling/coordinating care for PE thrombectomy f/u.   This chart was dictated using voice recognition software.  Despite best efforts to proofread,  errors can occur which can change the documentation meaning.

## 2022-09-23 NOTE — Progress Notes (Signed)
NAME:  Holly [name], MRN:  [id number], DOB:  05-Apr-1996, LOS: 1 ADMISSION DATE:  09/22/2022, CONSULTATION DATE:  09/22/22 REFERRING MD:  Dalene Seltzer, CHIEF COMPLAINT:  dyspnea   History of Present Illness:  27yF with history of IDD, BiPD, Shizoaffective d/o, PTSD, lives with caregiver/has guardian, who is seen in ED for acute dyspnea. She was seen in PCP office today for it and found to be in AF/RVR per caregiver. Here she has been given a liter of crystalloid and was found to have PE with large clot burden, radiographic right heart strain and borderline massive/submassive physiology. PCCM was consulted.   She has never had any prior blood clot. She was seen in ED here for RLE swelling and was given ABX for cellulitis. She is on depo provera and has been on it for years. No recent surgery/travel. Never smoker. No family history of blood clots. No bleeding issues or head trauma.   Pertinent  Medical History  Mood/psychotic disorders IDD DM  Significant Hospital Events: Including procedures, antibiotic start and stop dates in addition to other pertinent events   5/16 admitted to ICU for high risk PE, IR consulted  Interim History / Subjective:  Patient stated feeling much better, denies shortness of breath and chest pain Continue to complain of right lower leg pain and swelling  Objective   Blood pressure 110/79, pulse 99, temperature 97.7 F (36.5 C), temperature source Oral, resp. rate (!) 21, height 5' (1.524 m), weight 110 kg, SpO2 98 %.    FiO2 (%):  [21 %] 21 %   Intake/Output Summary (Last 24 hours) at 09/23/2022 4098 Last data filed at 09/23/2022 0400 Gross per 24 hour  Intake 135.99 ml  Output --  Net 135.99 ml   Filed Weights   09/22/22 1902  Weight: 110 kg    Examination: Physical exam: General: Young morbidly obese female, lying on the bed HEENT: [place]/AT, eyes anicteric.  moist mucus membranes Neuro: Alert, awake following commands Chest: Coarse breath sounds,  no wheezes or rhonchi Heart: Regular rate and rhythm, no murmurs or gallops Abdomen: Soft, nontender, nondistended, bowel sounds present Skin: No rash  Labs and images were reviewed  Resolved Hospital Problem list    Assessment & Plan:  Acute bilateral submassive PE with cor pulmonale Probable right lower extremity DVT Depo-provera and morbid obesity are provoking factor Underwent mechanical thrombectomy Continue IV heparin infusion Echocardiogram and Doppler ultrasound of lower extremity are pending  Demand cardiac ischemia Serum troponins were elevated likely due to bilateral submassive PE with cor pulmonale Echocardiogram   Mood/psychotic disorders Home meds were restarted  Diabetes type 2 Patient has known history of diabetes type 2 but her hemoglobin A1c never decreased 6 She is on metformin I think that is likely due to obesity Monitor fingerstick  Morbid obesity Diet and exercise counseling provided  Hypokalemia Continue aggressive electrolyte supplement monitor  Best Practice (right click and "Reselect all SmartList Selections" daily)   Diet/type: Regular consistency DVT prophylaxis: systemic heparin GI prophylaxis: N/A Lines: N/A Foley:  N/A Code Status:  full code Last date of multidisciplinary goals of care discussion: [5/16: Updated pt and caretaker (though not guardian) [name] at bedside]  Labs   CBC: Recent Labs  Lab 09/22/22 1722 09/22/22 1802 09/23/22 0154  WBC 9.5  --  10.4  NEUTROABS 6.9  --   --   HGB 13.4 13.9 10.9*  HCT 41.6 41.0 33.0*  MCV 92.4  --  90.7  PLT 244  --  174    Basic Metabolic Panel: Recent Labs  Lab 09/22/22 1722 09/22/22 1802 09/23/22 0154  NA 137 140 137  K 3.6 3.8 3.3*  CL 105 106 106  CO2 18*  --  20*  GLUCOSE 140* 141* 121*  BUN 7 6 7   CREATININE 1.10* 0.90 0.89  CALCIUM 8.9  --  8.1*  MG 1.9  --   --    GFR: Estimated Creatinine Clearance: 105 mL/min (by C-G formula based on SCr of 0.89  mg/dL). Recent Labs  Lab 09/22/22 1722 09/22/22 1747 09/22/22 1956 09/23/22 0154  WBC 9.5  --   --  10.4  LATICACIDVEN  --  1.8 1.6  --     Liver Function Tests: Recent Labs  Lab 09/22/22 1722  AST 22  ALT 10  ALKPHOS 110  BILITOT 0.3  PROT 7.3  ALBUMIN 3.4*   No results for input(s): "LIPASE", "AMYLASE" in the last 168 hours. No results for input(s): "AMMONIA" in the last 168 hours.  ABG    Component Value Date/Time   TCO2 20 (L) 09/22/2022 1802     Coagulation Profile: Recent Labs  Lab 09/23/22 0154  INR 1.2    Cardiac Enzymes: No results for input(s): "CKTOTAL", "CKMB", "CKMBINDEX", "TROPONINI" in the last 168 hours.  HbA1C: Hgb A1c MFr Bld  Date/Time Value Ref Range Status  09/23/2022 01:54 AM 5.2 4.8 - 5.6 % Final    Comment:    (NOTE) Pre diabetes:          5.7%-6.4%  Diabetes:              >6.4%  Glycemic control for   <7.0% adults with diabetes   08/21/2015 06:16 AM 5.6 4.8 - 5.6 % Final    Comment:    (NOTE)         Pre-diabetes: 5.7 - 6.4         Diabetes: >6.4         Glycemic control for adults with diabetes: <7.0     CBG: Recent Labs  Lab 09/22/22 2129 09/23/22 0132 09/23/22 0315 09/23/22 0739  GLUCAP 117* 115* 103* 99      Cheri Fowler, MD [facility] See Amion for pager If no response to pager, please call [phone] until 7pm After 7pm, Please call E-link [phone]

## 2022-09-23 NOTE — Progress Notes (Signed)
ANTICOAGULATION CONSULT NOTE  Pharmacy Consult for heparin infusion Indication: acute pulmonary embolus and RLE DVT  No Active Allergies   Patient Measurements: Height: 5' (152.4 cm) Weight:  (Bed scale is showing that the patient is weighing at 5.7 kg) IBW/kg (Calculated) : 45.5 Heparin Dosing Weight: 72.8 kg  Vital Signs: Temp: 97.6 F (36.4 C) (05/17 1145) Temp Source: Oral (05/17 1145) BP: 109/64 (05/17 1200) Pulse Rate: 109 (05/17 1300)  Labs: Recent Labs    09/22/22 1722 09/22/22 1802 09/22/22 1956 09/23/22 0154 09/23/22 0417 09/23/22 1325  HGB 13.4 13.9  --  10.9*  --   --   HCT 41.6 41.0  --  33.0*  --   --   PLT 244  --   --  174  --   --   APTT  --   --   --  189* 82*  --   LABPROT  --   --   --  15.3*  --   --   INR  --   --   --  1.2  --   --   HEPARINUNFRC  --   --   --  1.08* 0.68 0.25*  CREATININE 1.10* 0.90  --  0.89  --   --   TROPONINIHS 144*  --  158*  --   --   --      Estimated Creatinine Clearance: 105 mL/min (by C-G formula based on SCr of 0.89 mg/dL).   Assessment: Presented with SOB that started 5/15 PM with new onset AF RVR and HR in 140s. On Depo-Provera PTA. CTA showed acute PE with moderate to large thrombus burden with RV strain. S/p mechanical thrombectomy in IR on 5/17 AM Doppler showed RLE DVT.  Heparin level 0.25, subtherapeutic Current heparin infusion rate: 1200 units/hr  Hgb trending down from 13.9 to 10.9 Platelets trending down from 244 to 174. CBC downward trend likely dilutional effect No s/sx of bleeding  Goal of Therapy:  Heparin level 0.3-0.7 units/ml Monitor platelets by anticoagulation protocol: Yes   Plan:  Re-bolus heparin 1100 units IV x1 Increase heparin infusion rate to 1350 units/hr Check heparin level in 6 hours Monitor daily CBC, heparin level, and for s/sx of bleeding F/u transition to PO anticoag   Thank you for involving pharmacy in this patient's care.  Wilburn Cornelia, PharmD,  BCPS Clinical Pharmacist 09/23/2022 2:36 PM   Please refer to AMION for pharmacy phone number

## 2022-09-23 NOTE — Progress Notes (Signed)
ANTICOAGULATION CONSULT NOTE  Pharmacy Consult for heparin Indication: pulmonary embolus  No Active Allergies   Patient Measurements: Height: 5' (152.4 cm) Weight: 110 kg (242 lb 8.1 oz) IBW/kg (Calculated) : 45.5 Heparin Dosing Weight: 72.8 kg  Vital Signs: Temp: 97.5 F (36.4 C) (05/16 1655) BP: 102/71 (05/17 0230) Pulse Rate: 115 (05/17 0230)  Labs: Recent Labs    09/22/22 1722 09/22/22 1802 09/22/22 1956 09/23/22 0154  HGB 13.4 13.9  --  10.9*  HCT 41.6 41.0  --  33.0*  PLT 244  --   --  174  APTT  --   --   --  189*  LABPROT  --   --   --  15.3*  INR  --   --   --  1.2  HEPARINUNFRC  --   --   --  1.08*  CREATININE 1.10* 0.90  --  0.89  TROPONINIHS 144*  --  158*  --      Estimated Creatinine Clearance: 105 mL/min (by C-G formula based on SCr of 0.89 mg/dL).   Assessment: Presented with SOB that started 5/15 PM with new onset AF RVR and HR in 140s. On Depo-Provera PTA. CTA showed acute PE with moderate to large thrombus burden with RV strain.  S/p mechanical thrombectomy in IR. Pt received 8000 units IV heparin in IR between 2330-2350. Heparin level elevated to 1.08 (likely reflecting extra heparin given in IR).   Goal of Therapy:  Heparin level 0.3-0.7 units/ml Monitor platelets by anticoagulation protocol: Yes   Plan:  Continue heparin at 1200 units/hr Will get repeat heparin level at 0600 (~6 hr past IR heparin boluses)  Thank you for involving pharmacy in this patient's care.  Christoper Fabian, PharmD, BCPS Please see amion for complete clinical pharmacist phone list 09/23/2022 2:38 AM

## 2022-09-23 NOTE — Progress Notes (Signed)
ANTICOAGULATION CONSULT NOTE  Pharmacy Consult for heparin infusion Indication: acute pulmonary embolus and RLE DVT  No Active Allergies   Patient Measurements: Height: 5' (152.4 cm) Weight: 109.1 kg (240 lb 8 oz) IBW/kg (Calculated) : 45.5 Heparin Dosing Weight: 72.8 kg  Vital Signs: Temp: 97.8 F (36.6 C) (05/17 2201) Temp Source: Oral (05/17 2201) BP: 114/69 (05/17 2201) Pulse Rate: 117 (05/17 2201)  Labs: Recent Labs    09/22/22 1722 09/22/22 1802 09/22/22 1956 09/23/22 0154 09/23/22 0154 09/23/22 0417 09/23/22 1325 09/23/22 2135  HGB 13.4 13.9  --  10.9*  --   --   --   --   HCT 41.6 41.0  --  33.0*  --   --   --   --   PLT 244  --   --  174  --   --   --   --   APTT  --   --   --  189*  --  82*  --   --   LABPROT  --   --   --  15.3*  --   --   --   --   INR  --   --   --  1.2  --   --   --   --   HEPARINUNFRC  --   --   --  1.08*   < > 0.68 0.25* 0.23*  CREATININE 1.10* 0.90  --  0.89  --   --   --   --   TROPONINIHS 144*  --  158*  --   --   --   --   --    < > = values in this interval not displayed.     Estimated Creatinine Clearance: 104.4 mL/min (by C-G formula based on SCr of 0.89 mg/dL).   Assessment: Presented with SOB that started 5/15 PM with new onset AF RVR and HR in 140s. On Depo-Provera PTA. CTA showed acute PE with moderate to large thrombus burden with RV strain. S/p mechanical thrombectomy in IR on 5/17 AM. Doppler showed RLE DVT.  Heparin level 0.23, subtherapeutic Current heparin infusion rate: 1350 units/hr -no issues per RN   Goal of Therapy:  Heparin level 0.3-0.7 units/ml Monitor platelets by anticoagulation protocol: Yes   Plan:  Re-bolus heparin 1000 units IV x1 Increase heparin infusion rate to 1500 units/hr Check heparin level in 6 hours Monitor daily CBC, heparin level, and for s/sx of bleeding F/u transition to enteral anticoag   Calton Dach, PharmD Clinical Pharmacist 09/23/2022 10:04 PM

## 2022-09-23 NOTE — Progress Notes (Addendum)
Referring Physician(s): PCCM; Felisa Bonier  Supervising Physician: Roanna Banning   Patient Status:  Holly [name]  Chief Complaint:  Dyspnea, PE S/p thrombectomy by Dr. Milford Cage on 5/16   Subjective:  Patient seen with Dr. Milford Cage.  She is laying in bed, NAD, sleeping.  States that her breathing is much better, she was told that she may be transfer out of ICU today.   Allergies: Patient has no active allergies.  Medications: Prior to Admission medications   Medication Sig Start Date End Date Taking? Authorizing Provider  amantadine (SYMMETREL) 100 MG capsule Take 1 capsule (100 mg total) by mouth 2 (two) times daily for 7 days. Takes 1 at 8AM and 1 at [facility] 05/09/22 09/23/22 Yes Princess Bruins, DO  ARIPiprazole (ABILIFY) 10 MG tablet Take 10 mg by mouth daily.   Yes [provider]  clindamycin (CLEOCIN) 300 MG capsule Take 1 capsule (300 mg total) by mouth 3 (three) times daily. 09/13/22  Yes Wallis Bamberg, PA-C  levothyroxine (SYNTHROID) 100 MCG tablet Take 1 tablet (100 mcg total) by mouth daily before breakfast for 7 days. For low thyroid function Patient taking differently: Take 100 mcg by mouth daily before breakfast. 05/09/22 09/23/22 Yes Princess Bruins, DO  linaclotide (LINZESS) 290 MCG CAPS capsule Take 290 mcg by mouth daily before breakfast.   Yes [provider]  lithium carbonate (LITHOBID) 300 MG ER tablet Take 3 tablets (900 mg total) by mouth at bedtime for 7 days. For mood stabilization Patient taking differently: Take 600 mg by mouth at bedtime. 05/09/22 09/23/22 Yes Princess Bruins, DO  metFORMIN (GLUCOPHAGE) 1000 MG tablet Take 1,000 mg by mouth at bedtime.   Yes [provider]  omeprazole (PRILOSEC) 20 MG capsule Take 20 mg by mouth daily.   Yes [provider]  traZODone (DESYREL) 150 MG tablet Take 300 mg by mouth at bedtime.   Yes [provider]  citalopram (CELEXA) 20 MG tablet Take 1 tablet (20 mg total) by mouth daily for  7 days. Patient not taking: Reported on 09/23/2022 05/09/22 09/22/22  Princess Bruins, DO  FLUoxetine (PROZAC) 20 MG capsule Take 1 capsule (20 mg total) by mouth daily for 7 days. For depression Patient not taking: Reported on 09/23/2022 05/09/22 09/22/22  Princess Bruins, DO  medroxyPROGESTERone (DEPO-PROVERA) 150 MG/ML injection Inject 150 mg into the muscle every 3 (three) months. Patient not taking: Reported on 09/23/2022    [provider]  metFORMIN (GLUCOPHAGE) 500 MG tablet Take 1 tablet (500 mg) at breakfast & 2 tablets (1000 mg) at supper: For diabetes Patient not taking: Reported on 09/23/2022 05/09/22   Princess Bruins, DO  trazodone (DESYREL) 300 MG tablet Take 1 tablet (300 mg total) by mouth at bedtime for 7 days. Patient not taking: Reported on 09/23/2022 05/09/22 09/22/22  Princess Bruins, DO     Vital Signs: BP 110/79 (BP Location: Left Arm)   Pulse 99   Temp 97.7 F (36.5 C) (Oral)   Resp (!) 21   Ht 5' (1.524 m)   Wt 242 lb 8.1 oz (110 kg)   SpO2 98%   BMI 47.36 kg/m   Physical Exam Vitals reviewed.  Constitutional:      General: She is not in acute distress.    Appearance: She is not ill-appearing.  HENT:     Head: Normocephalic.  Cardiovascular:     Rate and Rhythm: Tachycardia present.  Pulmonary:     Effort: Pulmonary effort is normal.  Musculoskeletal:  Cervical back: Neck supple.  Skin:    General: Skin is warm and dry.     Coloration: Skin is not jaundiced or pale.     Comments: Purse string suture in right CFV puncture site. Site is clean and dry, no active bleeding. Mildly tender to touch, site soft.   Neurological:     Mental Status: She is alert and oriented to person, place, and time.  Psychiatric:        Mood and Affect: Mood normal.        Behavior: Behavior normal.     Imaging: VAS [place] LOWER EXTREMITY VENOUS (DVT)  Result Date: 09/23/2022  Lower Venous DVT Study Patient Name:  Holly [name]  Date of Exam:   09/23/2022 Medical Rec #:  [id number]          Accession #:    [id number] Date of Birth: [birth year]          Patient Gender: F Patient Age:   27 years Exam Location:  [facility] Procedure:      VAS [place] LOWER EXTREMITY VENOUS (DVT) Referring Phys: Felisa Bonier --------------------------------------------------------------------------------  Indications: Pulmonary embolism, and S/P thrombectomy for pulmonary embolis on 09/22/22.  Anticoagulation: Heparin. Performing Technologist: Marilynne Halsted RDMS, RVT  Examination Guidelines: A complete evaluation includes B-mode imaging, spectral Doppler, color Doppler, and power Doppler as needed of all accessible portions of each vessel. Bilateral testing is considered an integral part of a complete examination. Limited examinations for reoccurring indications may be performed as noted. The reflux portion of the exam is performed with the patient in reverse Trendelenburg.  +---------+---------------+---------+-----------+----------+-----------------+ RIGHT    CompressibilityPhasicitySpontaneityPropertiesThrombus Aging    +---------+---------------+---------+-----------+----------+-----------------+ CFV      Full           Yes      Yes                                    +---------+---------------+---------+-----------+----------+-----------------+ SFJ      Full                                                           +---------+---------------+---------+-----------+----------+-----------------+ FV Prox  Full                                                           +---------+---------------+---------+-----------+----------+-----------------+ FV Mid   Full                                                           +---------+---------------+---------+-----------+----------+-----------------+ FV DistalFull                                                           +---------+---------------+---------+-----------+----------+-----------------+  PFV      Full                                                            +---------+---------------+---------+-----------+----------+-----------------+ POP      None           No       No                   Age Indeterminate +---------+---------------+---------+-----------+----------+-----------------+ PTV      Partial                                      Age Indeterminate +---------+---------------+---------+-----------+----------+-----------------+ PERO     None                                         Age Indeterminate +---------+---------------+---------+-----------+----------+-----------------+   +---------+---------------+---------+-----------+----------+--------------+ LEFT     CompressibilityPhasicitySpontaneityPropertiesThrombus Aging +---------+---------------+---------+-----------+----------+--------------+ CFV      Full           Yes      Yes                                 +---------+---------------+---------+-----------+----------+--------------+ SFJ      Full                                                        +---------+---------------+---------+-----------+----------+--------------+ FV Prox  Full                                                        +---------+---------------+---------+-----------+----------+--------------+ FV Mid   Full                                                        +---------+---------------+---------+-----------+----------+--------------+ FV DistalFull                                                        +---------+---------------+---------+-----------+----------+--------------+ PFV      Full                                                        +---------+---------------+---------+-----------+----------+--------------+ POP      Full           Yes  Yes                                 +---------+---------------+---------+-----------+----------+--------------+ PTV      Full                                                         +---------+---------------+---------+-----------+----------+--------------+ PERO     Full                                                        +---------+---------------+---------+-----------+----------+--------------+    Summary: RIGHT: - Findings consistent with age indeterminate deep vein thrombosis involving the right popliteal vein, right posterior tibial veins, and right peroneal veins. - No cystic structure found in the popliteal fossa.  LEFT: - There is no evidence of deep vein thrombosis in the lower extremity.  - No cystic structure found in the popliteal fossa.  *See table(s) above for measurements and observations.    Preliminary    CT Angio Chest PE W and/or Wo Contrast  Result Date: 09/22/2022 CLINICAL DATA:  High clinical suspicion for PE EXAM: CT ANGIOGRAPHY CHEST WITH CONTRAST TECHNIQUE: Multidetector CT imaging of the chest was performed using the standard protocol during bolus administration of intravenous contrast. Multiplanar CT image reconstructions and MIPs were obtained to evaluate the vascular anatomy. RADIATION DOSE REDUCTION: This exam was performed according to the departmental dose-optimization program which includes automated exposure control, adjustment of the mA and/or kV according to patient size and/or use of iterative reconstruction technique. CONTRAST:  58mL OMNIPAQUE IOHEXOL 350 MG/ML SOLN COMPARISON:  Chest radiograph done earlier today FINDINGS: Cardiovascular: There is homogeneous enhancement in thoracic aorta. There are intraluminal filling defects in lobar, segmental and subsegmental branches in both lungs. There is moderate to large thrombus burden. There is no demonstrable saddle embolus in the main pulmonary arteries. There is dilation of right ventricular cavity. RV LV ratio is 1.7. Main pulmonary artery measures 3.5 cm in diameter suggesting possible pulmonary arterial hypertension. Mediastinum/Nodes: Small pericardial  effusion is present. Lungs/Pleura: There are few scattered ground-glass densities in both lungs, more so in the left lower lung field. There is no focal consolidation. There is no pleural effusion or pneumothorax. Upper Abdomen: No acute findings are seen. Musculoskeletal: Unremarkable. Review of the MIP images confirms the above findings. IMPRESSION: Acute pulmonary embolism with moderate to large thrombus burden. Filling defects are seen in lobar, segmental and subsegmental branches in both lungs. RV LV ratio is 1.7 suggesting right ventricular strain. There is prominence of main pulmonary artery suggesting possible pulmonary arterial hypertension. There small scattered faint ground-glass densities in the lower lung fields, more so on the left side suggesting scarring or interstitial pneumonia or early changes of pulmonary infarction. There is no pleural effusion or pneumothorax. Imaging findings were relayed to patient's provider Dr. Dalene Seltzer by telephone call. Electronically Signed   By: Ernie Avena M.D.   On: 09/22/2022 19:48   DG Chest Portable 1 View  Result Date: 09/22/2022 CLINICAL DATA:  Chest pressure pain EXAM: PORTABLE CHEST 1 VIEW COMPARISON:  08/18/2015 FINDINGS: The heart size and mediastinal  contours are within normal limits. Both lungs are clear. The visualized skeletal structures are unremarkable. IMPRESSION: No active disease. Electronically Signed   By: Jasmine Pang M.D.   On: 09/22/2022 18:06    Labs:  CBC: Recent Labs    09/22/22 1722 09/22/22 1802 09/23/22 0154  WBC 9.5  --  10.4  HGB 13.4 13.9 10.9*  HCT 41.6 41.0 33.0*  PLT 244  --  174    COAGS: Recent Labs    09/23/22 0154 09/23/22 0417  INR 1.2  --   APTT 189* 82*    BMP: Recent Labs    09/22/22 1722 09/22/22 1802 09/23/22 0154  NA 137 140 137  K 3.6 3.8 3.3*  CL 105 106 106  CO2 18*  --  20*  GLUCOSE 140* 141* 121*  BUN 7 6 7   CALCIUM 8.9  --  8.1*  CREATININE 1.10* 0.90 0.89   GFRNONAA >60  --  >60    LIVER FUNCTION TESTS: Recent Labs    09/22/22 1722  BILITOT 0.3  AST 22  ALT 10  ALKPHOS 110  PROT 7.3  ALBUMIN 3.4*    Assessment and Plan:  27 y.o. 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 Milford Cage on 09/22/22.   Remains tachycardic, BP stable, breathing 98 % on 2 L  Purse string suture removed w/o difficulty, dressing placed.  Patient was instructed to keep the R CFV site dry and clean until full heals, no submerge for 7 days, no lifting more than 10 pounds for 7 days.   Further treatment plan per PCCM Appreciate and agree with the plan.  Please call IR for questions and concerns.    Electronically Signed: Willette Brace, PA-C 09/23/2022, 11:16 AM   I spent a total of 15 Minutes at the the patient's bedside AND on the patient's hospital floor or unit, greater than 50% of which was counseling/coordinating care for PE thrombectomy f/u.   This chart was dictated using voice recognition software.  Despite best efforts to proofread,  errors can occur which can change the documentation meaning.

## 2022-09-23 NOTE — Progress Notes (Signed)
ANTICOAGULATION CONSULT NOTE  Pharmacy Consult for heparin Indication: pulmonary embolus  No Active Allergies   Patient Measurements: Height: 5' (152.4 cm) Weight: 110 kg (242 lb 8.1 oz) IBW/kg (Calculated) : 45.5 Heparin Dosing Weight: 72.8 kg  Vital Signs: Temp: 98.1 F (36.7 C) (05/17 0328) Temp Source: Oral (05/17 0328) BP: 113/77 (05/17 0430) Pulse Rate: 110 (05/17 0430)  Labs: Recent Labs    09/22/22 1722 09/22/22 1802 09/22/22 1956 09/23/22 0154 09/23/22 0417  HGB 13.4 13.9  --  10.9*  --   HCT 41.6 41.0  --  33.0*  --   PLT 244  --   --  174  --   APTT  --   --   --  189* 82*  LABPROT  --   --   --  15.3*  --   INR  --   --   --  1.2  --   HEPARINUNFRC  --   --   --  1.08* 0.68  CREATININE 1.10* 0.90  --  0.89  --   TROPONINIHS 144*  --  158*  --   --      Estimated Creatinine Clearance: 105 mL/min (by C-G formula based on SCr of 0.89 mg/dL).   Assessment: Presented with SOB that started 5/15 PM with new onset AF RVR and HR in 140s. On Depo-Provera PTA. CTA showed acute PE with moderate to large thrombus burden with RV strain.  S/p mechanical thrombectomy in IR. Pt received 8000 units IV heparin in IR between 2330-2350. Heparin level elevated to 1.08 (likely reflecting extra heparin given in IR).   5/17 AM update:  Heparin level therapeutic  Goal of Therapy:  Heparin level 0.3-0.7 units/ml Monitor platelets by anticoagulation protocol: Yes   Plan:  Continue heparin at 1200 units/hr 1400 heparin level  Abran Duke, PharmD, BCPS Clinical Pharmacist Phone: [phone]

## 2022-09-23 NOTE — Progress Notes (Signed)
[facility] ADULT ICU REPLACEMENT PROTOCOL   The patient does apply for the [facility] Adult ICU Electrolyte Replacment Protocol based on the criteria listed below:   1.Exclusion criteria: TCTS, ECMO, Dialysis, and Myasthenia Gravis patients 2. Is GFR >/= 30 ml/min? Yes.    Patient's GFR today is >60 3. Is SCr </= 2? Yes.   Patient's SCr is 0.89 mg/dL 4. Did SCr increase >/= 0.5 in 24 hours? No. 5.Pt's weight >40kg  Yes.   6. Abnormal electrolyte(s):   K 3.3  7. Electrolytes replaced per protocol 8.  Call MD STAT for K+ </= 2.5, Phos </= 1, or Mag </= 1 Physician:  Shawn Stall R Pegge Cumberledge 09/23/2022 2:54 AM

## 2022-09-23 NOTE — Progress Notes (Signed)
  Echocardiogram 2D Echocardiogram has been performed.  Holly [name] 09/23/2022, 9:10 AM

## 2022-09-23 NOTE — TOC Initial Note (Signed)
Transition of Care [facility]) - Initial/Assessment Note    Patient Details  Name: Holly [name] MRN: [id number] Date of Birth: 07/08/1995  [facility]) CM/SW Contact:    Ralene Bathe, LCSWA Phone Number: 09/23/2022, 3:18 PM  Clinical Narrative:                 LCSW contacted by Pharmacist inquiring about insurance coverage for medications.    LCSW contacted [name], patient's caregiver.  Ms. Moshe Cipro is listed as the Legal guardian, but reports being the patient's caregiver.  Per Ms. Moshe Cipro, she was given the authority to make decisions for the patient by the patient's legal guardian, Ms. Shaune Pollack.  LCSW requested contact information for Ms. Lord, but was informed that the legal guardian is difficult to get in contact with.   The patient resides with Ms. Moshe Cipro at her home under the umbrella of [employer].  Ms. Moshe Cipro confirmed the patient's Medicaid number and reports that Medicaid is still active.  LCSW contacted [place] [employer], call reference number [id number].  The representative reports that the patient's medicaid is currently active and automatically re-certifies.  Per the representative, the patient's Medicaid is active through 2099.  Pharmacist informed.  TOC will continue to follow.   Expected Discharge Plan: Group Home Barriers to Discharge: Continued Medical Work up   Patient Goals and CMS Choice            Expected Discharge Plan and Services In-house Referral: Clinical Social Work     Living arrangements for the past 2 months: Group Home                                      Prior Living Arrangements/Services Living arrangements for the past 2 months: Group Home Lives with:: Facility Resident Patient language and need for interpreter reviewed:: Yes Do you feel safe going back to the place where you live?: Yes      Need for Family Participation in Patient Care: No (Comment) Care giver support system in place?: Yes (comment)   Criminal  Activity/Legal Involvement Pertinent to Current Situation/Hospitalization: No - Comment as needed  Activities of Daily Living      Permission Sought/Granted   Permission granted to share information with : Yes, Verbal Permission Granted (via caregiver)              Emotional Assessment       Orientation: : Oriented to Situation, Oriented to  Time, Oriented to Place, Oriented to Self Alcohol / Substance Use: Not Applicable Psych Involvement: No (comment)  Admission diagnosis:  Pulmonary embolism (HCC) [I26.99] Patient Active Problem List   Diagnosis Date Noted   Pulmonary embolism (HCC) 09/22/2022   Morbid obesity (HCC) 12/24/2015   Depression    Ingestion of foreign body 12/17/2015   Hallucinations    Suicidal ideation    Injury, self-inflicted    Schizoaffective disorder, bipolar type (HCC)    Mild intellectual disability    Psychosis (HCC) 03/18/2014   ADHD (attention deficit hyperactivity disorder) 01/03/2013   PTSD (post-traumatic stress disorder) 01/03/2013   Agitation 01/03/2013   PCP:  Renaye Rakers, MD Pharmacy:   [facility] [place], [place] - [address] [place] [address] Phone: [phone] Fax: [phone]  [facility] - [place], [place] - [address], [address] [place]  [address] Phone: [phone] Fax: [phone]  [facility] - [place], [place] - [address] [place] [address] Phone: [phone] Fax: [phone]  [facility] - [place], [place] - [address] [place] [address] Phone: [phone] Fax: [phone]     Social Determinants of Health (SDOH) Social History: SDOH Screenings   Tobacco Use: Low Risk  (09/22/2022)   SDOH Interventions:     Readmission Risk Interventions     No data to display

## 2022-09-23 NOTE — Procedures (Deleted)
Referring Physician(s): PCCM; Felisa Bonier  Supervising Physician: Marliss Coots  Patient Status:  [name] - In-pt  Chief Complaint:  Dyspnea, PE S/p thrombectomy by Dr. Milford Cage on 5/16   Subjective:  Patient seen with Dr. Milford Cage.  She is laying in bed, NAD, sleeping.  States that her breathing is much better, she was told that she may be transfer out of ICU today.   Allergies: Patient has no active allergies.  Medications: Prior to Admission medications   Medication Sig Start Date End Date Taking? Authorizing Provider  amantadine (SYMMETREL) 100 MG capsule Take 1 capsule (100 mg total) by mouth 2 (two) times daily for 7 days. Takes 1 at 8AM and 1 at [facility] 05/09/22 09/23/22 Yes Princess Bruins, DO  ARIPiprazole (ABILIFY) 10 MG tablet Take 10 mg by mouth daily.   Yes [provider]  clindamycin (CLEOCIN) 300 MG capsule Take 1 capsule (300 mg total) by mouth 3 (three) times daily. 09/13/22  Yes Wallis Bamberg, PA-C  levothyroxine (SYNTHROID) 100 MCG tablet Take 1 tablet (100 mcg total) by mouth daily before breakfast for 7 days. For low thyroid function Patient taking differently: Take 100 mcg by mouth daily before breakfast. 05/09/22 09/23/22 Yes Princess Bruins, DO  linaclotide (LINZESS) 290 MCG CAPS capsule Take 290 mcg by mouth daily before breakfast.   Yes [provider]  lithium carbonate (LITHOBID) 300 MG ER tablet Take 3 tablets (900 mg total) by mouth at bedtime for 7 days. For mood stabilization Patient taking differently: Take 600 mg by mouth at bedtime. 05/09/22 09/23/22 Yes Princess Bruins, DO  metFORMIN (GLUCOPHAGE) 1000 MG tablet Take 1,000 mg by mouth at bedtime.   Yes [provider]  omeprazole (PRILOSEC) 20 MG capsule Take 20 mg by mouth daily.   Yes [provider]  traZODone (DESYREL) 150 MG tablet Take 300 mg by mouth at bedtime.   Yes [provider]  citalopram (CELEXA) 20 MG tablet Take 1 tablet (20 mg total) by mouth daily for  7 days. Patient not taking: Reported on 09/23/2022 05/09/22 09/22/22  Princess Bruins, DO  FLUoxetine (PROZAC) 20 MG capsule Take 1 capsule (20 mg total) by mouth daily for 7 days. For depression Patient not taking: Reported on 09/23/2022 05/09/22 09/22/22  Princess Bruins, DO  medroxyPROGESTERone (DEPO-PROVERA) 150 MG/ML injection Inject 150 mg into the muscle every 3 (three) months. Patient not taking: Reported on 09/23/2022    [provider]  metFORMIN (GLUCOPHAGE) 500 MG tablet Take 1 tablet (500 mg) at breakfast & 2 tablets (1000 mg) at supper: For diabetes Patient not taking: Reported on 09/23/2022 05/09/22   Princess Bruins, DO  trazodone (DESYREL) 300 MG tablet Take 1 tablet (300 mg total) by mouth at bedtime for 7 days. Patient not taking: Reported on 09/23/2022 05/09/22 09/22/22  Princess Bruins, DO     Vital Signs: BP 110/79 (BP Location: Left Arm)   Pulse 99   Temp 97.7 F (36.5 C) (Oral)   Resp (!) 21   Ht 5' (1.524 m)   Wt 242 lb 8.1 oz (110 kg)   SpO2 98%   BMI 47.36 kg/m   Physical Exam Vitals reviewed.  Constitutional:      General: She is not in acute distress.    Appearance: She is not ill-appearing.  HENT:     Head: Normocephalic.  Cardiovascular:     Rate and Rhythm: Tachycardia present.  Pulmonary:     Effort: Pulmonary effort is normal.  Musculoskeletal:  Cervical back: Neck supple.  Skin:    General: Skin is warm and dry.     Coloration: Skin is not jaundiced or pale.     Comments: Purse string suture in right CFV puncture site. Site is clean and dry, no active bleeding. Mildly tender to touch, site soft.   Neurological:     Mental Status: She is alert and oriented to person, place, and time.  Psychiatric:        Mood and Affect: Mood normal.        Behavior: Behavior normal.     Imaging: VAS [place] LOWER EXTREMITY VENOUS (DVT)  Result Date: 09/23/2022  Lower Venous DVT Study Patient Name:  Holly [name]  Date of Exam:   09/23/2022 Medical Rec #:  [id number]          Accession #:    [id number] Date of Birth: 1995/12/31          Patient Gender: F Patient Age:   27 years Exam Location:  [facility], [place] Procedure:      VAS [place] LOWER EXTREMITY VENOUS (DVT) Referring Phys: Felisa Bonier --------------------------------------------------------------------------------  Indications: Pulmonary embolism, and S/P thrombectomy for pulmonary embolis on 09/22/22.  Anticoagulation: Heparin. Performing Technologist: Marilynne Halsted RDMS, RVT  Examination Guidelines: A complete evaluation includes B-mode imaging, spectral Doppler, color Doppler, and power Doppler as needed of all accessible portions of each vessel. Bilateral testing is considered an integral part of a complete examination. Limited examinations for reoccurring indications may be performed as noted. The reflux portion of the exam is performed with the patient in reverse Trendelenburg.  +---------+---------------+---------+-----------+----------+-----------------+ RIGHT    CompressibilityPhasicitySpontaneityPropertiesThrombus Aging    +---------+---------------+---------+-----------+----------+-----------------+ CFV      Full           Yes      Yes                                    +---------+---------------+---------+-----------+----------+-----------------+ SFJ      Full                                                           +---------+---------------+---------+-----------+----------+-----------------+ FV Prox  Full                                                           +---------+---------------+---------+-----------+----------+-----------------+ FV Mid   Full                                                           +---------+---------------+---------+-----------+----------+-----------------+ FV DistalFull                                                           +---------+---------------+---------+-----------+----------+-----------------+  PFV      Full                                                            +---------+---------------+---------+-----------+----------+-----------------+ POP      None           No       No                   Age Indeterminate +---------+---------------+---------+-----------+----------+-----------------+ PTV      Partial                                      Age Indeterminate +---------+---------------+---------+-----------+----------+-----------------+ PERO     None                                         Age Indeterminate +---------+---------------+---------+-----------+----------+-----------------+   +---------+---------------+---------+-----------+----------+--------------+ LEFT     CompressibilityPhasicitySpontaneityPropertiesThrombus Aging +---------+---------------+---------+-----------+----------+--------------+ CFV      Full           Yes      Yes                                 +---------+---------------+---------+-----------+----------+--------------+ SFJ      Full                                                        +---------+---------------+---------+-----------+----------+--------------+ FV Prox  Full                                                        +---------+---------------+---------+-----------+----------+--------------+ FV Mid   Full                                                        +---------+---------------+---------+-----------+----------+--------------+ FV DistalFull                                                        +---------+---------------+---------+-----------+----------+--------------+ PFV      Full                                                        +---------+---------------+---------+-----------+----------+--------------+ POP      Full           Yes  Yes                                 +---------+---------------+---------+-----------+----------+--------------+ PTV      Full                                                         +---------+---------------+---------+-----------+----------+--------------+ PERO     Full                                                        +---------+---------------+---------+-----------+----------+--------------+    Summary: RIGHT: - Findings consistent with age indeterminate deep vein thrombosis involving the right popliteal vein, right posterior tibial veins, and right peroneal veins. - No cystic structure found in the popliteal fossa.  LEFT: - There is no evidence of deep vein thrombosis in the lower extremity.  - No cystic structure found in the popliteal fossa.  *See table(s) above for measurements and observations.    Preliminary    CT Angio Chest PE W and/or Wo Contrast  Result Date: 09/22/2022 CLINICAL DATA:  High clinical suspicion for PE EXAM: CT ANGIOGRAPHY CHEST WITH CONTRAST TECHNIQUE: Multidetector CT imaging of the chest was performed using the standard protocol during bolus administration of intravenous contrast. Multiplanar CT image reconstructions and MIPs were obtained to evaluate the vascular anatomy. RADIATION DOSE REDUCTION: This exam was performed according to the departmental dose-optimization program which includes automated exposure control, adjustment of the mA and/or kV according to patient size and/or use of iterative reconstruction technique. CONTRAST:  58mL OMNIPAQUE IOHEXOL 350 MG/ML SOLN COMPARISON:  Chest radiograph done earlier today FINDINGS: Cardiovascular: There is homogeneous enhancement in thoracic aorta. There are intraluminal filling defects in lobar, segmental and subsegmental branches in both lungs. There is moderate to large thrombus burden. There is no demonstrable saddle embolus in the main pulmonary arteries. There is dilation of right ventricular cavity. RV LV ratio is 1.7. Main pulmonary artery measures 3.5 cm in diameter suggesting possible pulmonary arterial hypertension. Mediastinum/Nodes: Small pericardial  effusion is present. Lungs/Pleura: There are few scattered ground-glass densities in both lungs, more so in the left lower lung field. There is no focal consolidation. There is no pleural effusion or pneumothorax. Upper Abdomen: No acute findings are seen. Musculoskeletal: Unremarkable. Review of the MIP images confirms the above findings. IMPRESSION: Acute pulmonary embolism with moderate to large thrombus burden. Filling defects are seen in lobar, segmental and subsegmental branches in both lungs. RV LV ratio is 1.7 suggesting right ventricular strain. There is prominence of main pulmonary artery suggesting possible pulmonary arterial hypertension. There small scattered faint ground-glass densities in the lower lung fields, more so on the left side suggesting scarring or interstitial pneumonia or early changes of pulmonary infarction. There is no pleural effusion or pneumothorax. Imaging findings were relayed to patient's provider Dr. Dalene Seltzer by telephone call. Electronically Signed   By: Ernie Avena M.D.   On: 09/22/2022 19:48   DG Chest Portable 1 View  Result Date: 09/22/2022 CLINICAL DATA:  Chest pressure pain EXAM: PORTABLE CHEST 1 VIEW COMPARISON:  08/18/2015 FINDINGS: The heart size and mediastinal  contours are within normal limits. Both lungs are clear. The visualized skeletal structures are unremarkable. IMPRESSION: No active disease. Electronically Signed   By: Jasmine Pang M.D.   On: 09/22/2022 18:06    Labs:  CBC: Recent Labs    09/22/22 1722 09/22/22 1802 09/23/22 0154  WBC 9.5  --  10.4  HGB 13.4 13.9 10.9*  HCT 41.6 41.0 33.0*  PLT 244  --  174    COAGS: Recent Labs    09/23/22 0154 09/23/22 0417  INR 1.2  --   APTT 189* 82*    BMP: Recent Labs    09/22/22 1722 09/22/22 1802 09/23/22 0154  NA 137 140 137  K 3.6 3.8 3.3*  CL 105 106 106  CO2 18*  --  20*  GLUCOSE 140* 141* 121*  BUN 7 6 7   CALCIUM 8.9  --  8.1*  CREATININE 1.10* 0.90 0.89   GFRNONAA >60  --  >60    LIVER FUNCTION TESTS: Recent Labs    09/22/22 1722  BILITOT 0.3  AST 22  ALT 10  ALKPHOS 110  PROT 7.3  ALBUMIN 3.4*    Assessment and Plan:  27 y.o. 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 Milford Cage on 09/22/22.   Remains tachycardic, BP stable, breathing 98 % on 2 L  Purse string suture removed w/o difficulty, dressing placed.  Patient was instructed to keep the R CFV site dry and clean until full heals, no submerge for 7 days, no lifting more than 10 pounds for 7 days.   Further treatment plan per PCCM Appreciate and agree with the plan.  Please call IR for questions and concerns.    Electronically Signed: Willette Brace, PA-C 09/23/2022, 11:16 AM   I spent a total of 15 Minutes at the the patient's bedside AND on the patient's hospital floor or unit, greater than 50% of which was counseling/coordinating care for PE thrombectomy f/u.   This chart was dictated using voice recognition software.  Despite best efforts to proofread,  errors can occur which can change the documentation meaning.

## 2022-09-24 DIAGNOSIS — R7989 Other specified abnormal findings of blood chemistry: Secondary | ICD-10-CM | POA: Diagnosis not present

## 2022-09-24 DIAGNOSIS — I2609 Other pulmonary embolism with acute cor pulmonale: Secondary | ICD-10-CM | POA: Diagnosis not present

## 2022-09-24 LAB — BASIC METABOLIC PANEL
Anion gap: 8 (ref 5–15)
BUN: <5 mg/dL — ABNORMAL LOW (ref 6–20)
CO2: 22 mmol/L (ref 22–32)
Calcium: 8.4 mg/dL — ABNORMAL LOW (ref 8.9–10.3)
Chloride: 109 mmol/L (ref 98–111)
Creatinine, Ser: 0.78 mg/dL (ref 0.44–1.00)
GFR, Estimated: >60 mL/min (ref >60)
Glucose, Bld: 124 mg/dL — ABNORMAL HIGH (ref 70–99)
Potassium: 3.8 mmol/L (ref 3.5–5.1)
Sodium: 139 mmol/L (ref 135–145)

## 2022-09-24 LAB — GLUCOSE, CAPILLARY
Glucose-Capillary: 108 mg/dL — ABNORMAL HIGH (ref 70–99)
Glucose-Capillary: 114 mg/dL — ABNORMAL HIGH (ref 70–99)
Glucose-Capillary: 116 mg/dL — ABNORMAL HIGH (ref 70–99)
Glucose-Capillary: 119 mg/dL — ABNORMAL HIGH (ref 70–99)
Glucose-Capillary: 121 mg/dL — ABNORMAL HIGH (ref 70–99)
Glucose-Capillary: 165 mg/dL — ABNORMAL HIGH (ref 70–99)
Glucose-Capillary: 85 mg/dL (ref 70–99)

## 2022-09-24 LAB — CBC
HCT: 31.5 % — ABNORMAL LOW (ref 36.0–46.0)
Hemoglobin: 10.4 g/dL — ABNORMAL LOW (ref 12.0–15.0)
MCH: 29.9 pg (ref 26.0–34.0)
MCHC: 33 g/dL (ref 30.0–36.0)
MCV: 90.5 fL (ref 80.0–100.0)
Platelets: 186 10*3/uL (ref 150–400)
RBC: 3.48 MIL/uL — ABNORMAL LOW (ref 3.87–5.11)
RDW: 13.4 % (ref 11.5–15.5)
WBC: 7.8 10*3/uL (ref 4.0–10.5)
nRBC: 0 % (ref 0.0–0.2)

## 2022-09-24 LAB — MAGNESIUM: Magnesium: 1.7 mg/dL (ref 1.7–2.4)

## 2022-09-24 LAB — HEPARIN LEVEL (UNFRACTIONATED)
Heparin Unfractionated: 0.19 IU/mL — ABNORMAL LOW (ref 0.30–0.70)
Heparin Unfractionated: 0.39 IU/mL (ref 0.30–0.70)
Heparin Unfractionated: 0.44 IU/mL (ref 0.30–0.70)

## 2022-09-24 MED ORDER — METOPROLOL TARTRATE 5 MG/5ML IV SOLN
2.5000 mg | INTRAVENOUS | Status: DC | PRN
  Administered 2022-09-24: 2.5 mg via INTRAVENOUS
  Filled 2022-09-24: qty 5

## 2022-09-24 MED ORDER — LINACLOTIDE 145 MCG PO CAPS
290.0000 ug | ORAL_CAPSULE | Freq: Every day | ORAL | Status: DC
  Administered 2022-09-25 – 2022-09-27 (×3): 290 ug via ORAL
  Filled 2022-09-24 (×4): qty 2

## 2022-09-24 MED ORDER — METOPROLOL TARTRATE 5 MG/5ML IV SOLN: 5.0000 mg | INTRAVENOUS | Status: DC | PRN

## 2022-09-24 MED ORDER — HEPARIN BOLUS VIA INFUSION
2200.0000 [IU] | Freq: Once | INTRAVENOUS | Status: AC
  Administered 2022-09-24: 2200 [IU] via INTRAVENOUS
  Filled 2022-09-24: qty 2200

## 2022-09-24 NOTE — Progress Notes (Signed)
ANTICOAGULATION CONSULT NOTE  Pharmacy Consult for heparin infusion Indication: acute pulmonary embolus and RLE DVT  No Active Allergies   Patient Measurements: Height: 5\' 5"  (165.1 cm) Weight: 108.8 kg (239 lb 12.8 oz) IBW/kg (Calculated) : 57 Heparin Dosing Weight: 72.8 kg  Vital Signs: Temp: 98.9 F (37.2 C) (05/18 1135) Temp Source: Oral (05/18 1135) BP: 106/74 (05/18 1135) Pulse Rate: 119 (05/18 0749)  Labs: Recent Labs    09/22/22 1722 09/22/22 1802 09/22/22 1802 09/22/22 1956 09/23/22 0154 09/23/22 0417 09/23/22 1325 09/23/22 2135 09/24/22 0527 09/24/22 1358  HGB 13.4 13.9  --   --  10.9*  --   --   --  10.4*  --   HCT 41.6 41.0  --   --  33.0*  --   --   --  31.5*  --   PLT 244  --   --   --  174  --   --   --  186  --   APTT  --   --   --   --  189* 82*  --   --   --   --   LABPROT  --   --   --   --  15.3*  --   --   --   --   --   INR  --   --   --   --  1.2  --   --   --   --   --   HEPARINUNFRC  --   --    < >  --  1.08* 0.68   < > 0.23* 0.19* 0.39  CREATININE 1.10* 0.90  --   --  0.89  --   --   --  0.78  --   TROPONINIHS 144*  --   --  158*  --   --   --   --   --   --    < > = values in this interval not displayed.    Estimated Creatinine Clearance: 127.3 mL/min (by C-G formula based on SCr of 0.78 mg/dL).   Assessment: Presented with SOB that started 5/15 PM with new onset AF RVR and HR in 140s. On Depo-Provera PTA. CTA showed acute PE with moderate to large thrombus burden with RV strain. S/p mechanical thrombectomy in IR on 5/17 AM. Doppler showed RLE DVT.  Heparin level returned at 0.39, therapeutic on 1750 units/hr. Hgb 10s and plts 174>186. No signs/symptoms of bleeding or issues with the heparin infusion running per the RN  Goal of Therapy:  Heparin level 0.3-0.7 units/ml Monitor platelets by anticoagulation protocol: Yes   Plan:  Continue heparin infusion rate to 1750 units/hr Check heparin level in 6 hours Monitor daily CBC,  heparin level, and for s/sx of bleeding F/u transition to enteral anticoag  Thank you for involving pharmacy in this patient's care.  Arabella Merles, PharmD. [facility] Acute Care PGY-1 09/24/2022 2:58 PM

## 2022-09-24 NOTE — Progress Notes (Signed)
PROGRESS NOTE    KAYELANI KAFKA  ZOX:[id number] DOB: [birth year] DOA: 09/22/2022 PCP: Renaye Rakers, MD   Brief Narrative: 27 year old female with history of bipolar disorder schizoaffective disorder PTSD insulin-dependent diabetes lives at home with her caregiver admitted with acute shortness of breath found to be in A-fib RVR on admission and has acute bilateral submassive PE with large clot burden with right heart strain.  Patient was admitted by [facility] 09/22/2022 transferred to [facility] on 09/24/2022.  She is status post thrombectomy This was thought to be secondary to Depo-Provera and morbid obesity.  He was treated with IV heparin and thrombectomy. Echocardiogram-ejection fraction 55 to 60%.  Interventricular septum is flattened consistent with right ventricular pressure overload.  Right ventricular systolic function is severely reduced.  Severe RV strain in the setting of acute PE. Venous Doppler of the lower extremities-right lower extremity DVT.  Assessment & Plan:   Principal Problem:   Pulmonary embolism (HCC)   #1 acute bilateral pulmonary embolism submassive with right heart strain status post thrombectomy on IV heparin Feels reporting better. Echo with right heart strain Right lower extremity with age-indeterminate DVT Continue heparin  #2 history of PTSD and bipolar schizophrenia continue lithium Abilify  #3 chronic constipation restart Linzess  #4 hypothyroidism on Synthroid TSH 2.16  #5 diabetes continue metformin A1c 5.2 CBG (last 3)  Recent Labs    09/24/22 0431 09/24/22 0753 09/24/22 1134  GLUCAP 116* 165* 85                       Estimated body mass index is 39.9 kg/m as calculated from the following:   Height as of this encounter: 5\' 5"  (1.651 m).   Weight as of this encounter: 108.8 kg.  DVT prophylaxis: Heparin Code Status: Full code Family Communication: Discussed with guardian/caregiver  disposition Plan:  Status is: Inpatient Remains  inpatient appropriate because: Acute PE submassive bilateral   Consultants:  PCCM and interventional radiology  Procedures: Thrombectomy Antimicrobials: None  Subjective: She denies chest pain or shortness of breath eating breakfast Complains of right lower extremity pain Doppler shows right lower extremity DVT  Objective: Vitals:   09/24/22 0438 09/24/22 0745 09/24/22 0749 09/24/22 1135  BP:   105/67 106/74  Pulse:   (!) 119   Resp:   18 18  Temp: 98.9 F (37.2 C)  99.7 F (37.6 C) 98.9 F (37.2 C)  TempSrc: Oral  Oral Oral  SpO2:  99% 99%   Weight: 108.8 kg     Height:        Intake/Output Summary (Last 24 hours) at 09/24/2022 1334 Last data filed at 09/24/2022 0252 Gross per 24 hour  Intake 539.31 ml  Output 850 ml  Net -310.69 ml   Filed Weights   09/22/22 1902 09/23/22 2201 09/24/22 0438  Weight: 110 kg 109.1 kg 108.8 kg    Examination:  General exam: Appears in no acute distress Respiratory system: Scattered rhonchi to auscultation. Respiratory effort normal. Cardiovascular system: S1 & S2 heard, RRR. No JVD, murmurs, rubs, gallops or clicks. No pedal edema. Gastrointestinal system: Abdomen is nondistended, soft and nontender. No organomegaly or masses felt. Normal bowel sounds heard. Central nervous system: Alert and oriented. No focal neurological deficits. Extremities: Right lower extremity edema   Data Reviewed: I have personally reviewed following labs and imaging studies  CBC: Recent Labs  Lab 09/22/22 1722 09/22/22 1802 09/23/22 0154 09/24/22 0527  WBC 9.5  --  10.4 7.8  NEUTROABS 6.9  --   --   --  HGB 13.4 13.9 10.9* 10.4*  HCT 41.6 41.0 33.0* 31.5*  MCV 92.4  --  90.7 90.5  PLT 244  --  174 186   Basic Metabolic Panel: Recent Labs  Lab 09/22/22 1722 09/22/22 1802 09/23/22 0154 09/24/22 0527  NA 137 140 137 139  K 3.6 3.8 3.3* 3.8  CL 105 106 106 109  CO2 18*  --  20* 22  GLUCOSE 140* 141* 121* 124*  BUN 7 6 7  <5*   CREATININE 1.10* 0.90 0.89 0.78  CALCIUM 8.9  --  8.1* 8.4*  MG 1.9  --   --  1.7   GFR: Estimated Creatinine Clearance: 127.3 mL/min (by C-G formula based on SCr of 0.78 mg/dL). Liver Function Tests: Recent Labs  Lab 09/22/22 1722  AST 22  ALT 10  ALKPHOS 110  BILITOT 0.3  PROT 7.3  ALBUMIN 3.4*   No results for input(s): "LIPASE", "AMYLASE" in the last 168 hours. No results for input(s): "AMMONIA" in the last 168 hours. Coagulation Profile: Recent Labs  Lab 09/23/22 0154  INR 1.2   Cardiac Enzymes: No results for input(s): "CKTOTAL", "CKMB", "CKMBINDEX", "TROPONINI" in the last 168 hours. BNP (last 3 results) No results for input(s): "PROBNP" in the last 8760 hours. HbA1C: Recent Labs    09/23/22 0154  HGBA1C 5.2   CBG: Recent Labs  Lab 09/23/22 1933 09/24/22 0048 09/24/22 0431 09/24/22 0753 09/24/22 1134  GLUCAP 110* 121* 116* 165* 85   Lipid Profile: No results for input(s): "CHOL", "HDL", "LDLCALC", "TRIG", "CHOLHDL", "LDLDIRECT" in the last 72 hours. Thyroid Function Tests: Recent Labs    09/22/22 1722  TSH 2.160  FREET4 1.40*   Anemia Panel: No results for input(s): "VITAMINB12", "FOLATE", "FERRITIN", "TIBC", "IRON", "RETICCTPCT" in the last 72 hours. Sepsis Labs: Recent Labs  Lab 09/22/22 1747 09/22/22 1956  LATICACIDVEN 1.8 1.6    Recent Results (from the past 240 hour(s))  MRSA Next Gen by PCR, Nasal     Status: None   Collection Time: 09/22/22  9:32 PM   Specimen: Nasal Mucosa; Nasal Swab  Result Value Ref Range Status   MRSA by PCR Next Gen NOT DETECTED NOT DETECTED Final    Comment: (NOTE) The GeneXpert MRSA Assay (FDA approved for NASAL specimens only), is one component of a comprehensive MRSA colonization surveillance program. It is not intended to diagnose MRSA infection nor to guide or monitor treatment for MRSA infections. Test performance is not FDA approved in patients less than 27 years old. Performed at [facility], [address], [place], [place] [address]          Radiology Studies: IR THROMBECT PRIM MECH INIT [facility]) MOD SED  Result Date: 09/23/2022 INDICATION: Submassive PE Briefly, 27 year old female with a history of schizoaffective disorder who presented with AFib with RVR with CTA demonstrating submassive PE. RV/LV ratio 1.7 EXAM: Procedures: 1. PULMONARY ARTERIOGRAPHY 2. CATHETER-DIRECTED MECHANICAL THROMBECTOMY COMPARISON:  Chest XR and CTA PE, 09/22/2022. MEDICATIONS: 50 mg Benadryl IV.  8K IU heparin IV. ANESTHESIA/SEDATION: Moderate (conscious) sedation was employed during this procedure. A total of Versed 2 mg and Fentanyl 100 mcg was administered intravenously. Moderate Sedation Time: 101 minutes. The patient's level of consciousness and vital signs were monitored continuously by radiology nursing throughout the procedure under my direct supervision. CONTRAST:  80mL OMNIPAQUE IOHEXOL 300 MG/ML SOLN, 40mL OMNIPAQUE IOHEXOL 300 MG/ML SOLN FLUOROSCOPY TIME:  Fluoroscopic dose; 251 mGy COMPLICATIONS: None immediate. TECHNIQUE: Informed written consent was obtained from the  patient and/or patient's representative after a discussion of the risks, benefits and alternatives to treatment. Questions regarding the procedure were encouraged and answered. A timeout was performed prior to the initiation of the procedure. Ultrasound scanning was performed of the RIGHT groin and demonstrated patency of the RIGHT common femoral and greater saphenous veins. The RIGHT groin was prepped and draped in the usual sterile fashion, and a sterile drape was applied covering the operative field. Maximum barrier sterile technique with sterile gowns and gloves were used for the procedure. A timeout was performed prior to the initiation of the procedure. Local anesthesia was provided with 1% lidocaine. Under direct ultrasound guidance, the RIGHT greater saphenous vein was accessed with a micro puncture kit ultimately  allowing placement of a 7 Fr vascular sheath. Ultrasound and fluoroscopic spot images were saved for procedural documentation purposes. With the use of a 0.035 inch Bentson wire and an angled pigtail pulmonary catheter, access past the RIGHT heart and into the main pulmonary artery was obtained. Pre intervention central pulmonary arteriogram was performed. The Bentson wire was exchanged for an Amplatz wire then a 16 Fr, 33 cm GORE dry seal sheath then a 16 Fr Penumbra Lightning Flash aspiration catheter was advanced into the RIGHT then LEFT pulmonary arteries and catheter directed thrombectomy was performed. Intermittent and postprocedural pulmonary arteriograms were performed during thrombus removal. Following adequate angiographic result and the catheter and sheath were then removed, and hemostasis was obtained by manual pressure. The patient tolerated the procedure well without immediate postprocedural complication. FINDINGS: *Central pulmonary arteriogram demonstrates moderate burden of bilateral pulmonary emboli at the pulmonary arterial bifurcations, bilateral inferior lobar and the RIGHT superior lobar pulmonary artery. *Post interventional pulmonary arteriograms demonstrating adequate debulking thrombectomy from the bilateral inferior lobar pulmonary arteries. *Mild residual thrombus burden of the pulmonary arterial bifurcations. IMPRESSION: 1. Successful catheter-directed bilateral pulmonary arterial debulking mechanical thrombectomy. 2. Mild residual thrombus at the BILATERAL inferior lobar pulmonary arteries. PLAN: *Continue systemic anticoagulation with heparin gtt and transition to DOAC when clinically feasible. *The patient will be seen by me in the Vascular Interventional Radiology (VIR) clinic for postoperative follow-up, and including repeat RIGHT lower extremity venous duplex in 1 month. Roanna Banning, MD [facility] Radiology Electronically Signed    By: Roanna Banning M.D.   On: 09/23/2022 16:39   IR US Guide Vasc Access Right  Result Date: 09/23/2022 INDICATION: Submassive PE Briefly, 27 year old female with a history of schizoaffective disorder who presented with AFib with RVR with CTA demonstrating submassive PE. RV/LV ratio 1.7 EXAM: Procedures: 1. PULMONARY ARTERIOGRAPHY 2. CATHETER-DIRECTED MECHANICAL THROMBECTOMY COMPARISON:  Chest XR and CTA PE, 09/22/2022. MEDICATIONS: 50 mg Benadryl IV.  8K IU heparin IV. ANESTHESIA/SEDATION: Moderate (conscious) sedation was employed during this procedure. A total of Versed 2 mg and Fentanyl 100 mcg was administered intravenously. Moderate Sedation Time: 101 minutes. The patient's level of consciousness and vital signs were monitored continuously by radiology nursing throughout the procedure under my direct supervision. CONTRAST:  80mL OMNIPAQUE IOHEXOL 300 MG/ML SOLN, 40mL OMNIPAQUE IOHEXOL 300 MG/ML SOLN FLUOROSCOPY TIME:  Fluoroscopic dose; 251 mGy COMPLICATIONS: None immediate. TECHNIQUE: Informed written consent was obtained from the patient and/or patient's representative after a discussion of the risks, benefits and alternatives to treatment. Questions regarding the procedure were encouraged and answered. A timeout was performed prior to the initiation of the procedure. Ultrasound scanning was performed of the RIGHT groin and demonstrated patency of the RIGHT common femoral and greater saphenous veins. The  RIGHT groin was prepped and draped in the usual sterile fashion, and a sterile drape was applied covering the operative field. Maximum barrier sterile technique with sterile gowns and gloves were used for the procedure. A timeout was performed prior to the initiation of the procedure. Local anesthesia was provided with 1% lidocaine. Under direct ultrasound guidance, the RIGHT greater saphenous vein was accessed with a micro puncture kit ultimately allowing placement of a 7 Fr vascular sheath. Ultrasound  and fluoroscopic spot images were saved for procedural documentation purposes. With the use of a 0.035 inch Bentson wire and an angled pigtail pulmonary catheter, access past the RIGHT heart and into the main pulmonary artery was obtained. Pre intervention central pulmonary arteriogram was performed. The Bentson wire was exchanged for an Amplatz wire then a 16 Fr, 33 cm GORE dry seal sheath then a 16 Fr Penumbra Lightning Flash aspiration catheter was advanced into the RIGHT then LEFT pulmonary arteries and catheter directed thrombectomy was performed. Intermittent and postprocedural pulmonary arteriograms were performed during thrombus removal. Following adequate angiographic result and the catheter and sheath were then removed, and hemostasis was obtained by manual pressure. The patient tolerated the procedure well without immediate postprocedural complication. FINDINGS: *Central pulmonary arteriogram demonstrates moderate burden of bilateral pulmonary emboli at the pulmonary arterial bifurcations, bilateral inferior lobar and the RIGHT superior lobar pulmonary artery. *Post interventional pulmonary arteriograms demonstrating adequate debulking thrombectomy from the bilateral inferior lobar pulmonary arteries. *Mild residual thrombus burden of the pulmonary arterial bifurcations. IMPRESSION: 1. Successful catheter-directed bilateral pulmonary arterial debulking mechanical thrombectomy. 2. Mild residual thrombus at the BILATERAL inferior lobar pulmonary arteries. PLAN: *Continue systemic anticoagulation with heparin gtt and transition to DOAC when clinically feasible. *The patient will be seen by me in the Vascular Interventional Radiology (VIR) clinic for postoperative follow-up, and including repeat RIGHT lower extremity venous duplex in 1 month. Roanna Banning, MD [facility] Radiology Electronically Signed   By: Roanna Banning M.D.   On: 09/23/2022 16:39   IR  Angiogram Pulmonary Bilateral Selective  Result Date: 09/23/2022 INDICATION: Submassive PE Briefly, 27 year old female with a history of schizoaffective disorder who presented with AFib with RVR with CTA demonstrating submassive PE. RV/LV ratio 1.7 EXAM: Procedures: 1. PULMONARY ARTERIOGRAPHY 2. CATHETER-DIRECTED MECHANICAL THROMBECTOMY COMPARISON:  Chest XR and CTA PE, 09/22/2022. MEDICATIONS: 50 mg Benadryl IV.  8K IU heparin IV. ANESTHESIA/SEDATION: Moderate (conscious) sedation was employed during this procedure. A total of Versed 2 mg and Fentanyl 100 mcg was administered intravenously. Moderate Sedation Time: 101 minutes. The patient's level of consciousness and vital signs were monitored continuously by radiology nursing throughout the procedure under my direct supervision. CONTRAST:  80mL OMNIPAQUE IOHEXOL 300 MG/ML SOLN, 40mL OMNIPAQUE IOHEXOL 300 MG/ML SOLN FLUOROSCOPY TIME:  Fluoroscopic dose; 251 mGy COMPLICATIONS: None immediate. TECHNIQUE: Informed written consent was obtained from the patient and/or patient's representative after a discussion of the risks, benefits and alternatives to treatment. Questions regarding the procedure were encouraged and answered. A timeout was performed prior to the initiation of the procedure. Ultrasound scanning was performed of the RIGHT groin and demonstrated patency of the RIGHT common femoral and greater saphenous veins. The RIGHT groin was prepped and draped in the usual sterile fashion, and a sterile drape was applied covering the operative field. Maximum barrier sterile technique with sterile gowns and gloves were used for the procedure. A timeout was performed prior to the initiation of the procedure. Local anesthesia was provided with 1% lidocaine. Under direct ultrasound  guidance, the RIGHT greater saphenous vein was accessed with a micro puncture kit ultimately allowing placement of a 7 Fr vascular sheath. Ultrasound and fluoroscopic spot images were saved  for procedural documentation purposes. With the use of a 0.035 inch Bentson wire and an angled pigtail pulmonary catheter, access past the RIGHT heart and into the main pulmonary artery was obtained. Pre intervention central pulmonary arteriogram was performed. The Bentson wire was exchanged for an Amplatz wire then a 16 Fr, 33 cm GORE dry seal sheath then a 16 Fr Penumbra Lightning Flash aspiration catheter was advanced into the RIGHT then LEFT pulmonary arteries and catheter directed thrombectomy was performed. Intermittent and postprocedural pulmonary arteriograms were performed during thrombus removal. Following adequate angiographic result and the catheter and sheath were then removed, and hemostasis was obtained by manual pressure. The patient tolerated the procedure well without immediate postprocedural complication. FINDINGS: *Central pulmonary arteriogram demonstrates moderate burden of bilateral pulmonary emboli at the pulmonary arterial bifurcations, bilateral inferior lobar and the RIGHT superior lobar pulmonary artery. *Post interventional pulmonary arteriograms demonstrating adequate debulking thrombectomy from the bilateral inferior lobar pulmonary arteries. *Mild residual thrombus burden of the pulmonary arterial bifurcations. IMPRESSION: 1. Successful catheter-directed bilateral pulmonary arterial debulking mechanical thrombectomy. 2. Mild residual thrombus at the BILATERAL inferior lobar pulmonary arteries. PLAN: *Continue systemic anticoagulation with heparin gtt and transition to DOAC when clinically feasible. *The patient will be seen by me in the Vascular Interventional Radiology (VIR) clinic for postoperative follow-up, and including repeat RIGHT lower extremity venous duplex in 1 month. Roanna Banning, MD [facility] Radiology Electronically Signed   By: Roanna Banning M.D.   On: 09/23/2022 16:39   ECHOCARDIOGRAM COMPLETE  Result Date:  09/23/2022    ECHOCARDIOGRAM REPORT   Patient Name:   EBONIE GRISSETT Bilek Date of Exam: 09/23/2022 Medical Rec #:  [id number]         Height:       60.0 in Accession #:    [id number]        Weight:       242.5 lb Date of Birth:  1996/04/02         BSA:          2.026 m Patient Age:    27 years          BP:           110/79 mmHg Patient Gender: F                 HR:           101 bpm. Exam Location:  Inpatient Procedure: 2D Echo, Cardiac Doppler, Color Doppler and Intracardiac            Opacification Agent Indications:    Pulmonary embolus  History:        Patient has no prior history of Echocardiogram examinations.                 Signs/Symptoms:Dyspnea.  Sonographer:    Milda Smart Referring Phys: [id number] Ivor Costa MEIER  Sonographer Comments: Technically difficult study due to poor echo windows. Image acquisition challenging due to patient body habitus and Image acquisition challenging due to respiratory motion. IMPRESSIONS  1. Left ventricular ejection fraction, by estimation, is 55 to 60%. The left ventricle has normal function. The left ventricle has no regional wall motion abnormalities. Left ventricular diastolic parameters were normal. There is the interventricular septum is flattened in systole, consistent with right ventricular pressure overload.  2. Right ventricular systolic function is severely reduced. The right ventricular size is severely enlarged. Tricuspid regurgitation signal is inadequate for assessing PA pressure.  3. The mitral valve is normal in structure. Trivial mitral valve regurgitation. No evidence of mitral stenosis.  4. The aortic valve is normal in structure. Aortic valve regurgitation is not visualized. No aortic stenosis is present. Conclusion(s)/Recommendation(s): There is severe RV strain in the setting of acute PE. FINDINGS  Left Ventricle: Left ventricular ejection fraction, by estimation, is 55 to 60%. The left ventricle has normal function. The left ventricle has no regional  wall motion abnormalities. Definity contrast agent was given IV to delineate the left ventricular  endocardial borders. The left ventricular internal cavity size was normal in size. There is no left ventricular hypertrophy. The interventricular septum is flattened in systole, consistent with right ventricular pressure overload. Left ventricular diastolic parameters were normal. Right Ventricle: The right ventricular size is severely enlarged. No increase in right ventricular wall thickness. Right ventricular systolic function is severely reduced. Tricuspid regurgitation signal is inadequate for assessing PA pressure. Left Atrium: Left atrial size was normal in size. Right Atrium: Right atrial size was normal in size. Pericardium: There is no evidence of pericardial effusion. Mitral Valve: The mitral valve is normal in structure. Trivial mitral valve regurgitation. No evidence of mitral valve stenosis. Tricuspid Valve: The tricuspid valve is normal in structure. Tricuspid valve regurgitation is trivial. No evidence of tricuspid stenosis. Aortic Valve: The aortic valve is normal in structure. Aortic valve regurgitation is not visualized. No aortic stenosis is present. Pulmonic Valve: The pulmonic valve was normal in structure. Pulmonic valve regurgitation is trivial. No evidence of pulmonic stenosis. Aorta: The aortic root is normal in size and structure. Venous: The inferior vena cava was not well visualized. IAS/Shunts: No atrial level shunt detected by color flow Doppler.  LEFT VENTRICLE PLAX 2D LVIDd:         4.00 cm   Diastology LVIDs:         3.30 cm   LV e' medial:    6.74 cm/s LV PW:         1.00 cm   LV E/e' medial:  1.0 LV IVS:        0.80 cm   LV e' lateral:   15.10 cm/s LVOT diam:     2.10 cm   LV E/e' lateral: 0.4 LV SV:         45 LV SV Index:   22 LVOT Area:     3.46 cm  RIGHT VENTRICLE RV S prime:     8.05 cm/s TAPSE (M-mode): 1.3 cm LEFT ATRIUM             Index       RIGHT ATRIUM           Index LA  diam:        2.70 cm 1.33 cm/m  RA Area:     11.50 cm LA Vol (A2C):   13.5 ml 6.66 ml/m  RA Volume:   25.60 ml  12.64 ml/m LA Vol (A4C):   15.1 ml 7.45 ml/m LA Biplane Vol: 16.3 ml 8.05 ml/m  AORTIC VALVE LVOT Vmax:   95.60 cm/s LVOT Vmean:  70.800 cm/s LVOT VTI:    0.129 m  AORTA Ao Root diam: 3.10 cm Ao Asc diam:  3.30 cm MV E velocity: 6.74 cm/s  TRICUSPID VALVE  TR Peak grad:   14.3 mmHg                           TR Vmax:        189.00 cm/s                            SHUNTS                           Systemic VTI:  0.13 m                           Systemic Diam: 2.10 cm Arvilla Meres MD Electronically signed by Arvilla Meres MD Signature Date/Time: 09/23/2022/3:05:25 PM    Final    VAS [place] LOWER EXTREMITY VENOUS (DVT)  Result Date: 09/23/2022  Lower Venous DVT Study Patient Name:  JANISA COONTS Lopes  Date of Exam:   09/23/2022 Medical Rec #: [id number]          Accession #:    [id number] Date of Birth: Oct 17, 1995          Patient Gender: F Patient Age:   43 years Exam Location:  [facility] Procedure:      VAS [place] LOWER EXTREMITY VENOUS (DVT) Referring Phys: Felisa Bonier --------------------------------------------------------------------------------  Indications: Pulmonary embolism, and S/P thrombectomy for pulmonary embolis on 09/22/22.  Anticoagulation: Heparin. Performing Technologist: Marilynne Halsted RDMS, RVT  Examination Guidelines: A complete evaluation includes B-mode imaging, spectral Doppler, color Doppler, and power Doppler as needed of all accessible portions of each vessel. Bilateral testing is considered an integral part of a complete examination. Limited examinations for reoccurring indications may be performed as noted. The reflux portion of the exam is performed with the patient in reverse Trendelenburg.  +---------+---------------+---------+-----------+----------+-----------------+ RIGHT    CompressibilityPhasicitySpontaneityPropertiesThrombus  Aging    +---------+---------------+---------+-----------+----------+-----------------+ CFV      Full           Yes      Yes                                    +---------+---------------+---------+-----------+----------+-----------------+ SFJ      Full                                                           +---------+---------------+---------+-----------+----------+-----------------+ FV Prox  Full                                                           +---------+---------------+---------+-----------+----------+-----------------+ FV Mid   Full                                                           +---------+---------------+---------+-----------+----------+-----------------+ FV DistalFull                                                           +---------+---------------+---------+-----------+----------+-----------------+  PFV      Full                                                           +---------+---------------+---------+-----------+----------+-----------------+ POP      None           No       No                   Age Indeterminate +---------+---------------+---------+-----------+----------+-----------------+ PTV      Partial                                      Age Indeterminate +---------+---------------+---------+-----------+----------+-----------------+ PERO     None                                         Age Indeterminate +---------+---------------+---------+-----------+----------+-----------------+   +---------+---------------+---------+-----------+----------+--------------+ LEFT     CompressibilityPhasicitySpontaneityPropertiesThrombus Aging +---------+---------------+---------+-----------+----------+--------------+ CFV      Full           Yes      Yes                                 +---------+---------------+---------+-----------+----------+--------------+ SFJ      Full                                                         +---------+---------------+---------+-----------+----------+--------------+ FV Prox  Full                                                        +---------+---------------+---------+-----------+----------+--------------+ FV Mid   Full                                                        +---------+---------------+---------+-----------+----------+--------------+ FV DistalFull                                                        +---------+---------------+---------+-----------+----------+--------------+ PFV      Full                                                        +---------+---------------+---------+-----------+----------+--------------+ POP      Full           Yes  Yes                                 +---------+---------------+---------+-----------+----------+--------------+ PTV      Full                                                        +---------+---------------+---------+-----------+----------+--------------+ PERO     Full                                                        +---------+---------------+---------+-----------+----------+--------------+     Summary: RIGHT: - Findings consistent with age indeterminate deep vein thrombosis involving the right popliteal vein, right posterior tibial veins, and right peroneal veins. - No cystic structure found in the popliteal fossa.  LEFT: - There is no evidence of deep vein thrombosis in the lower extremity.  - No cystic structure found in the popliteal fossa.  *See table(s) above for measurements and observations. Electronically signed by Gerarda Fraction on 09/23/2022 at 2:55:16 PM.    Final    CT Angio Chest PE W and/or Wo Contrast  Result Date: 09/22/2022 CLINICAL DATA:  High clinical suspicion for PE EXAM: CT ANGIOGRAPHY CHEST WITH CONTRAST TECHNIQUE: Multidetector CT imaging of the chest was performed using the standard protocol during bolus administration of intravenous  contrast. Multiplanar CT image reconstructions and MIPs were obtained to evaluate the vascular anatomy. RADIATION DOSE REDUCTION: This exam was performed according to the departmental dose-optimization program which includes automated exposure control, adjustment of the mA and/or kV according to patient size and/or use of iterative reconstruction technique. CONTRAST:  58mL OMNIPAQUE IOHEXOL 350 MG/ML SOLN COMPARISON:  Chest radiograph done earlier today FINDINGS: Cardiovascular: There is homogeneous enhancement in thoracic aorta. There are intraluminal filling defects in lobar, segmental and subsegmental branches in both lungs. There is moderate to large thrombus burden. There is no demonstrable saddle embolus in the main pulmonary arteries. There is dilation of right ventricular cavity. RV LV ratio is 1.7. Main pulmonary artery measures 3.5 cm in diameter suggesting possible pulmonary arterial hypertension. Mediastinum/Nodes: Small pericardial effusion is present. Lungs/Pleura: There are few scattered ground-glass densities in both lungs, more so in the left lower lung field. There is no focal consolidation. There is no pleural effusion or pneumothorax. Upper Abdomen: No acute findings are seen. Musculoskeletal: Unremarkable. Review of the MIP images confirms the above findings. IMPRESSION: Acute pulmonary embolism with moderate to large thrombus burden. Filling defects are seen in lobar, segmental and subsegmental branches in both lungs. RV LV ratio is 1.7 suggesting right ventricular strain. There is prominence of main pulmonary artery suggesting possible pulmonary arterial hypertension. There small scattered faint ground-glass densities in the lower lung fields, more so on the left side suggesting scarring or interstitial pneumonia or early changes of pulmonary infarction. There is no pleural effusion or pneumothorax. Imaging findings were relayed to patient's provider Dr. Dalene Seltzer by telephone call.  Electronically Signed   By: Ernie Avena M.D.   On: 09/22/2022 19:48   DG Chest Portable 1 View  Result Date: 09/22/2022 CLINICAL DATA:  Chest pressure pain EXAM: PORTABLE CHEST  1 VIEW COMPARISON:  08/18/2015 FINDINGS: The heart size and mediastinal contours are within normal limits. Both lungs are clear. The visualized skeletal structures are unremarkable. IMPRESSION: No active disease. Electronically Signed   By: Jasmine Pang M.D.   On: 09/22/2022 18:06        Scheduled Meds:  amantadine  100 mg Oral BID   ARIPiprazole  10 mg Oral QHS   insulin aspart  0-15 Units Subcutaneous Q4H   levothyroxine  100 mcg Oral Q0600   lithium carbonate  600 mg Oral QHS   [START ON 09/25/2022] metFORMIN  1,000 mg Oral Q supper   pantoprazole  40 mg Oral Daily   traZODone  300 mg Oral QHS   Continuous Infusions:  heparin 1,750 Units/hr (09/24/22 0629)     LOS: 2 days    Time spent: 38 min  Alwyn Ren, MD  09/24/2022, 1:34 PM

## 2022-09-24 NOTE — Progress Notes (Signed)
ANTICOAGULATION CONSULT NOTE  Pharmacy Consult for heparin infusion Indication: acute pulmonary embolus and RLE DVT  No Active Allergies   Patient Measurements: Height: 5\' 5"  (165.1 cm) Weight: 108.8 kg (239 lb 12.8 oz) IBW/kg (Calculated) : 57 Heparin Dosing Weight: 72.8 kg  Vital Signs: Temp: 98.9 F (37.2 C) (05/18 0438) Temp Source: Oral (05/18 0438) BP: 103/83 (05/18 0045) Pulse Rate: 114 (05/18 0045)  Labs: Recent Labs    09/22/22 1722 09/22/22 1802 09/22/22 1802 09/22/22 1956 09/23/22 0154 09/23/22 0417 09/23/22 1325 09/23/22 2135 09/24/22 0527  HGB 13.4 13.9  --   --  10.9*  --   --   --  10.4*  HCT 41.6 41.0  --   --  33.0*  --   --   --  31.5*  PLT 244  --   --   --  174  --   --   --  186  APTT  --   --   --   --  189* 82*  --   --   --   LABPROT  --   --   --   --  15.3*  --   --   --   --   INR  --   --   --   --  1.2  --   --   --   --   HEPARINUNFRC  --   --    < >  --  1.08* 0.68 0.25* 0.23* 0.19*  CREATININE 1.10* 0.90  --   --  0.89  --   --   --   --   TROPONINIHS 144*  --   --  158*  --   --   --   --   --    < > = values in this interval not displayed.     Estimated Creatinine Clearance: 114.4 mL/min (by C-G formula based on SCr of 0.89 mg/dL).   Assessment: Presented with SOB that started 5/15 PM with new onset AF RVR and HR in 140s. On Depo-Provera PTA. CTA showed acute PE with moderate to large thrombus burden with RV strain. S/p mechanical thrombectomy in IR on 5/17 AM. Doppler showed RLE DVT.  Heparin level down to 0.19, subtherapeutic on 1500 units/hr -no issues per RN  -CBC stable  Goal of Therapy:  Heparin level 0.3-0.7 units/ml Monitor platelets by anticoagulation protocol: Yes   Plan:  Re-bolus heparin 2200 units IV x1 Increase heparin infusion rate to 1750 units/hr Check heparin level in 6 hours Monitor daily CBC, heparin level, and for s/sx of bleeding F/u transition to enteral anticoag  Thank you for involving  pharmacy in this patient's care.  Loura Back, PharmD, BCPS Clinical Pharmacist 09/24/2022 6:14 AM

## 2022-09-24 NOTE — Progress Notes (Signed)
MEWS Progress Note  Patient Details Name: RAYANNE THEDFORD MRN: [id number] DOB: [birth year] Today's Date: 09/24/2022   MEWS Flowsheet Documentation:  Assess: MEWS Score Temp: 98.9 F (37.2 C) BP: 106/74 MAP (mmHg): 83 Pulse Rate: (!) 119 ECG Heart Rate: (!) 119 Resp: 18 Level of Consciousness: Alert SpO2: 99 % O2 Device: Room Air Patient Activity (if Appropriate): In bed O2 Flow Rate (L/min): 2 L/min FiO2 (%): 21 % Assess: MEWS Score MEWS Temp: 0 MEWS Systolic: 0 MEWS Pulse: 2 MEWS RR: 0 MEWS LOC: 0 MEWS Score: 2 MEWS Score Color: Yellow Assess: SIRS CRITERIA SIRS Temperature : 0 SIRS Respirations : 0 SIRS Pulse: 1 SIRS WBC: 0 SIRS Score Sum : 1 Assess: if the MEWS score is Yellow or Red Were vital signs taken at a resting state?: Yes Focused Assessment: No change from prior assessment Does the patient meet 2 or more of the SIRS criteria?: No MEWS guidelines implemented : Yes, yellow Treat MEWS Interventions: Considered administering scheduled or prn medications/treatments as ordered Take Vital Signs Increase Vital Sign Frequency : Yellow: Q2hr x1, continue Q4hrs until patient remains green for 12hrs Escalate MEWS: Escalate: Yellow: Discuss with charge nurse and consider notifying provider and/or RRT Notify: Charge Nurse/RN Name of Charge Nurse/RN Notified: Hinton Dyer, RN Provider Notification Date Provider Notified: 09/22/22 Time Provider Notified: 1903 Test performed and critical result: Troponin 144 Date Critical Result Received: 09/22/22 Time Critical Result Received: 1902      Hinton Dyer 09/24/2022, 3:03 PM

## 2022-09-24 NOTE — Progress Notes (Signed)
ANTICOAGULATION CONSULT NOTE  Pharmacy Consult for heparin infusion Indication: acute pulmonary embolus and RLE DVT  No Active Allergies   Patient Measurements: Height: 5\' 5"  (165.1 cm) Weight: 108.8 kg (239 lb 12.8 oz) IBW/kg (Calculated) : 57 Heparin Dosing Weight: 72.8 kg  Vital Signs: Temp: 98 F (36.7 C) (05/18 1604) Temp Source: Oral (05/18 1604) BP: 113/73 (05/18 1604) Pulse Rate: 119 (05/18 0749)  Labs: Recent Labs    09/22/22 1722 09/22/22 1802 09/22/22 1802 09/22/22 1956 09/23/22 0154 09/23/22 0417 09/23/22 1325 09/24/22 0527 09/24/22 1358 09/24/22 1741  HGB 13.4 13.9  --   --  10.9*  --   --  10.4*  --   --   HCT 41.6 41.0  --   --  33.0*  --   --  31.5*  --   --   PLT 244  --   --   --  174  --   --  186  --   --   APTT  --   --   --   --  189* 82*  --   --   --   --   LABPROT  --   --   --   --  15.3*  --   --   --   --   --   INR  --   --   --   --  1.2  --   --   --   --   --   HEPARINUNFRC  --   --    < >  --  1.08* 0.68   < > 0.19* 0.39 0.44  CREATININE 1.10* 0.90  --   --  0.89  --   --  0.78  --   --   TROPONINIHS 144*  --   --  158*  --   --   --   --   --   --    < > = values in this interval not displayed.    Estimated Creatinine Clearance: 127.3 mL/min (by C-G formula based on SCr of 0.78 mg/dL).   Assessment: Presented with SOB that started 5/15 PM with new onset AF RVR and HR in 140s. On Depo-Provera PTA. CTA showed acute PE with moderate to large thrombus burden with RV strain. S/p mechanical thrombectomy in IR on 5/17 AM. Doppler showed RLE DVT.  Heparin level 0.44 is therapeutic on 1750 units/hr. Will increase slightly to target higher end of goal given large clot burden.    Goal of Therapy:  Heparin level 0.3-0.7 units/ml Monitor platelets by anticoagulation protocol: Yes   Plan:  Increase heparin infusion rate to 1800 units/hr Monitor daily CBC, heparin level, and for s/sx of bleeding F/u transition to enteral  anticoag  Thank you for involving pharmacy in this patient's care.  Alphia Moh, PharmD, BCPS, BCCP Clinical Pharmacist  Please check AMION for all [facility] phone numbers After 10:00 PM, call Main Pharmacy [phone]

## 2022-09-25 DIAGNOSIS — I2609 Other pulmonary embolism with acute cor pulmonale: Secondary | ICD-10-CM | POA: Diagnosis not present

## 2022-09-25 LAB — GLUCOSE, CAPILLARY
Glucose-Capillary: 83 mg/dL (ref 70–99)
Glucose-Capillary: 138 mg/dL — ABNORMAL HIGH (ref 70–99)
Glucose-Capillary: 107 mg/dL — ABNORMAL HIGH (ref 70–99)
Glucose-Capillary: 109 mg/dL — ABNORMAL HIGH (ref 70–99)
Glucose-Capillary: 89 mg/dL (ref 70–99)

## 2022-09-25 LAB — CBC
HCT: 33.4 % — ABNORMAL LOW (ref 36.0–46.0)
Hemoglobin: 10.4 g/dL — ABNORMAL LOW (ref 12.0–15.0)
MCH: 29.4 pg (ref 26.0–34.0)
MCHC: 31.1 g/dL (ref 30.0–36.0)
MCV: 94.4 fL (ref 80.0–100.0)
Platelets: 200 10*3/uL (ref 150–400)
RBC: 3.54 MIL/uL — ABNORMAL LOW (ref 3.87–5.11)
RDW: 13 % (ref 11.5–15.5)
WBC: 8.3 10*3/uL (ref 4.0–10.5)
nRBC: 0 % (ref 0.0–0.2)

## 2022-09-25 LAB — HEPARIN LEVEL (UNFRACTIONATED)
Heparin Unfractionated: 0.72 IU/mL — ABNORMAL HIGH (ref 0.30–0.70)
Heparin Unfractionated: 0.63 IU/mL (ref 0.30–0.70)

## 2022-09-25 NOTE — Progress Notes (Signed)
PROGRESS NOTE    Holly [name]  UJW:[id number] DOB: 03-09-1996 DOA: 09/22/2022 PCP: Renaye Rakers, MD   Brief Narrative: 27 year old female with history of bipolar disorder schizoaffective disorder PTSD insulin-dependent diabetes lives at home with her caregiver admitted with acute shortness of breath found to be in A-fib RVR on admission and has acute bilateral submassive PE with large clot burden with right heart strain.  Patient was admitted by [facility] 09/22/2022 transferred to [facility] on 09/24/2022.  She is status post thrombectomy This was thought to be secondary to Depo-Provera and morbid obesity.  He was treated with IV heparin and thrombectomy. Echocardiogram-ejection fraction 55 to 60%.  Interventricular septum is flattened consistent with right ventricular pressure overload.  Right ventricular systolic function is severely reduced.  Severe RV strain in the setting of acute PE. Venous Doppler of the lower extremities-right lower extremity DVT.  Assessment & Plan:   Principal Problem:   Pulmonary embolism (HCC) Active Problems:   Elevated troponin   #1 acute bilateral pulmonary embolism submassive with right heart strain status post thrombectomy on IV heparin Feels reporting better. Echo with right heart strain Right lower extremity with age-indeterminate DVT Continue heparin  #2 history of PTSD and bipolar schizophrenia continue lithium Abilify  #3 chronic constipation restart Linzess  #4 hypothyroidism on Synthroid TSH 2.16  #5 diabetes continue metformin A1c 5.2 CBG (last 3)  Recent Labs    09/25/22 0408 09/25/22 0826 09/25/22 1130  GLUCAP 83 138* 107*     Estimated body mass index is 39.41 kg/m as calculated from the following:   Height as of this encounter: 5\' 5"  (1.651 m).   Weight as of this encounter: 107.4 kg.  DVT prophylaxis: Heparin Code Status: Full code Family Communication: Discussed with guardian/caregiver  disposition Plan:  Status is:  Inpatient Remains inpatient appropriate because: Acute PE submassive bilateral   Consultants:  PCCM and interventional radiology  Procedures: Thrombectomy Antimicrobials: None  Subjective:  Feels better No chest pain No sob  Objective: Vitals:   09/25/22 0411 09/25/22 0828 09/25/22 1131 09/25/22 1132  BP: 105/66 108/71 120/65   Pulse: 96 (!) 112 (!) 113 (!) 107  Resp: 20 16 18    Temp: 99.3 F (37.4 C) 98.6 F (37 C) 99.4 F (37.4 C)   TempSrc: Oral Oral Oral   SpO2: 97% 97% 94%   Weight: 107.4 kg     Height:       No intake or output data in the 24 hours ending 09/25/22 1322  Filed Weights   09/23/22 2201 09/24/22 0438 09/25/22 0411  Weight: 109.1 kg 108.8 kg 107.4 kg    Examination:  General exam: Appears in no acute distress Respiratory system: Scattered rhonchi to auscultation. Respiratory effort normal. Cardiovascular system: S1 & S2 heard, RRR. No JVD, murmurs, rubs, gallops or clicks. No pedal edema. Gastrointestinal system: Abdomen is nondistended, soft and nontender. No organomegaly or masses felt. Normal bowel sounds heard. Central nervous system: Alert and oriented. No focal neurological deficits. Extremities: Right lower extremity edema   Data Reviewed: I have personally reviewed following labs and imaging studies  CBC: Recent Labs  Lab 09/22/22 1722 09/22/22 1802 09/23/22 0154 09/24/22 0527 09/25/22 0250  WBC 9.5  --  10.4 7.8 8.3  NEUTROABS 6.9  --   --   --   --   HGB 13.4 13.9 10.9* 10.4* 10.4*  HCT 41.6 41.0 33.0* 31.5* 33.4*  MCV 92.4  --  90.7 90.5 94.4  PLT 244  --  174 186 200    Basic Metabolic Panel: Recent Labs  Lab 09/22/22 1722 09/22/22 1802 09/23/22 0154 09/24/22 0527  NA 137 140 137 139  K 3.6 3.8 3.3* 3.8  CL 105 106 106 109  CO2 18*  --  20* 22  GLUCOSE 140* 141* 121* 124*  BUN 7 6 7  <5*  CREATININE 1.10* 0.90 0.89 0.78  CALCIUM 8.9  --  8.1* 8.4*  MG 1.9  --   --  1.7    GFR: Estimated Creatinine  Clearance: 126.5 mL/min (by C-G formula based on SCr of 0.78 mg/dL). Liver Function Tests: Recent Labs  Lab 09/22/22 1722  AST 22  ALT 10  ALKPHOS 110  BILITOT 0.3  PROT 7.3  ALBUMIN 3.4*    No results for input(s): "LIPASE", "AMYLASE" in the last 168 hours. No results for input(s): "AMMONIA" in the last 168 hours. Coagulation Profile: Recent Labs  Lab 09/23/22 0154  INR 1.2    Cardiac Enzymes: No results for input(s): "CKTOTAL", "CKMB", "CKMBINDEX", "TROPONINI" in the last 168 hours. BNP (last 3 results) No results for input(s): "PROBNP" in the last 8760 hours. HbA1C: Recent Labs    09/23/22 0154  HGBA1C 5.2    CBG: Recent Labs  Lab 09/24/22 2017 09/24/22 2339 09/25/22 0408 09/25/22 0826 09/25/22 1130  GLUCAP 119* 108* 83 138* 107*    Lipid Profile: No results for input(s): "CHOL", "HDL", "LDLCALC", "TRIG", "CHOLHDL", "LDLDIRECT" in the last 72 hours. Thyroid Function Tests: Recent Labs    09/22/22 1722  TSH 2.160  FREET4 1.40*    Anemia Panel: No results for input(s): "VITAMINB12", "FOLATE", "FERRITIN", "TIBC", "IRON", "RETICCTPCT" in the last 72 hours. Sepsis Labs: Recent Labs  Lab 09/22/22 1747 09/22/22 1956  LATICACIDVEN 1.8 1.6     Recent Results (from the past 240 hour(s))  MRSA Next Gen by PCR, Nasal     Status: None   Collection Time: 09/22/22  9:32 PM   Specimen: Nasal Mucosa; Nasal Swab  Result Value Ref Range Status   MRSA by PCR Next Gen NOT DETECTED NOT DETECTED Final    Comment: (NOTE) The GeneXpert MRSA Assay (FDA approved for NASAL specimens only), is one component of a comprehensive MRSA colonization surveillance program. It is not intended to diagnose MRSA infection nor to guide or monitor treatment for MRSA infections. Test performance is not FDA approved in patients less than 69 years old. Performed at [facility], [address] [place], [place] [address]          Radiology Studies: No results  found.      Scheduled Meds:  amantadine  100 mg Oral BID   ARIPiprazole  10 mg Oral QHS   insulin aspart  0-15 Units Subcutaneous Q4H   levothyroxine  100 mcg Oral Q0600   linaclotide  290 mcg Oral QAC breakfast   lithium carbonate  600 mg Oral QHS   metFORMIN  1,000 mg Oral Q supper   pantoprazole  40 mg Oral Daily   traZODone  300 mg Oral QHS   Continuous Infusions:  heparin 1,800 Units/hr (09/24/22 1916)     LOS: 3 days    Time spent: 38 min  Alwyn Ren, MD  09/25/2022, 1:22 PM

## 2022-09-25 NOTE — Progress Notes (Addendum)
ANTICOAGULATION CONSULT NOTE- Follow Up  Pharmacy Consult for heparin infusion Indication: acute pulmonary embolus and RLE DVT  No Active Allergies   Patient Measurements: Height: 5\' 5"  (165.1 cm) Weight: 107.4 kg (236 lb 12.8 oz) IBW/kg (Calculated) : 57 Heparin Dosing Weight: 72.8 kg  Vital Signs: Temp: 98.6 F (37 C) (05/19 0828) Temp Source: Oral (05/19 0828) BP: 108/71 (05/19 0828) Pulse Rate: 112 (05/19 0828)  Labs: Recent Labs    09/22/22 1722 09/22/22 1722 09/22/22 1802 09/22/22 1956 09/23/22 0154 09/23/22 0417 09/23/22 1325 09/24/22 0527 09/24/22 1358 09/24/22 1741 09/25/22 0250 09/25/22 0801  HGB 13.4  --  13.9  --  10.9*  --   --  10.4*  --   --  10.4*  --   HCT 41.6  --  41.0  --  33.0*  --   --  31.5*  --   --  33.4*  --   PLT 244  --   --   --  174  --   --  186  --   --  200  --   APTT  --   --   --   --  189* 82*  --   --   --   --   --   --   LABPROT  --   --   --   --  15.3*  --   --   --   --   --   --   --   INR  --   --   --   --  1.2  --   --   --   --   --   --   --   HEPARINUNFRC  --    < >  --   --  1.08* 0.68   < > 0.19*   < > 0.44 0.72* 0.63  CREATININE 1.10*  --  0.90  --  0.89  --   --  0.78  --   --   --   --   TROPONINIHS 144*  --   --  158*  --   --   --   --   --   --   --   --    < > = values in this interval not displayed.    Estimated Creatinine Clearance: 126.5 mL/min (by C-G formula based on SCr of 0.78 mg/dL).   Assessment: Presented with SOB that started 5/15 PM with new onset AF RVR and HR in 140s. On Depo-Provera PTA. CTA showed acute PE with moderate to large thrombus burden with RV strain. S/p mechanical thrombectomy in IR on 5/17 AM. Doppler showed RLE DVT.  Heparin was slightly increased to target the higher end of goal given large clot burden to 1800 units/hr. Heparin level 0.72 slightly above goal  Hgb remains around 10 and platelets increasing to 200. Per RN, no signs/symptoms of bleeding and no issues running  the infusion or how the lab was drawn. Will order a redraw since was only a bump in dose by 50 units/hr.   Goal of Therapy:  Heparin level 0.3-0.7 units/ml Monitor platelets by anticoagulation protocol: Yes   Plan:  F/u heparin level prior to dose change back to 1750 units/hr heparin infusion rate to 1800 units/hr Monitor daily CBC, heparin level, and for s/sx of bleeding F/u transition to enteral anticoag  Thank you for involving pharmacy in this patient's care.  Arabella Merles, PharmD. [facility]  PGY-1 09/25/2022 8:49 AM   ADDENDUM -Repeat heparin level returned at 0.63. Will continue the current rate and follow up with daily heparin levels.  Thanks,  Arabella Merles, PharmD. [facility] PGY-1 09/25/2022 8:50 AM

## 2022-09-26 ENCOUNTER — Telehealth (HOSPITAL_COMMUNITY): Payer: Self-pay

## 2022-09-26 ENCOUNTER — Other Ambulatory Visit (HOSPITAL_COMMUNITY): Payer: Self-pay

## 2022-09-26 DIAGNOSIS — R000 Tachycardia, unspecified: Secondary | ICD-10-CM

## 2022-09-26 LAB — CBC
HCT: 34.6 % — ABNORMAL LOW (ref 36.0–46.0)
Hemoglobin: 11.4 g/dL — ABNORMAL LOW (ref 12.0–15.0)
MCH: 30.1 pg (ref 26.0–34.0)
MCHC: 32.9 g/dL (ref 30.0–36.0)
MCV: 91.3 fL (ref 80.0–100.0)
Platelets: 267 10*3/uL (ref 150–400)
RBC: 3.79 MIL/uL — ABNORMAL LOW (ref 3.87–5.11)
RDW: 13 % (ref 11.5–15.5)
WBC: 9.4 10*3/uL (ref 4.0–10.5)
nRBC: 0 % (ref 0.0–0.2)

## 2022-09-26 LAB — GLUCOSE, CAPILLARY
Glucose-Capillary: 97 mg/dL (ref 70–99)
Glucose-Capillary: 113 mg/dL — ABNORMAL HIGH (ref 70–99)
Glucose-Capillary: 108 mg/dL — ABNORMAL HIGH (ref 70–99)
Glucose-Capillary: 121 mg/dL — ABNORMAL HIGH (ref 70–99)

## 2022-09-26 LAB — SURGICAL PATHOLOGY

## 2022-09-26 LAB — HEPARIN LEVEL (UNFRACTIONATED): Heparin Unfractionated: 0.46 IU/mL (ref 0.30–0.70)

## 2022-09-26 MED ORDER — APIXABAN 5 MG PO TABS
10.0000 mg | ORAL_TABLET | Freq: Two times a day (BID) | ORAL | Status: DC
  Administered 2022-09-26 – 2022-09-27 (×3): 10 mg via ORAL
  Filled 2022-09-26 (×3): qty 2

## 2022-09-26 MED ORDER — APIXABAN 5 MG PO TABS: 5.0000 mg | ORAL_TABLET | Freq: Two times a day (BID) | ORAL | Status: DC

## 2022-09-26 NOTE — Progress Notes (Signed)
PROGRESS NOTE    Holly [name]  ZOX:[id number] DOB: Apr 13, 1996 DOA: 09/22/2022 PCP: Renaye Rakers, MD   Brief Narrative: 27 year old female with history of bipolar disorder schizoaffective disorder PTSD insulin-dependent diabetes lives at home with her caregiver admitted with acute shortness of breath found to be in A-fib RVR on admission and has acute bilateral submassive PE with large clot burden with right heart strain.  Patient was admitted by [facility] 09/22/2022 transferred to [facility] on 09/24/2022.  She is status post thrombectomy This was thought to be secondary to Depo-Provera and morbid obesity.  He was treated with IV heparin and thrombectomy. Echocardiogram-ejection fraction 55 to 60%.  Interventricular septum is flattened consistent with right ventricular pressure overload.  Right ventricular systolic function is severely reduced.  Severe RV strain in the setting of acute PE. Venous Doppler of the lower extremities-right lower extremity DVT.  Assessment & Plan:   Principal Problem:   Pulmonary embolism (HCC) Active Problems:   Elevated troponin   #1 acute bilateral pulmonary embolism submassive with right heart strain status post thrombectomy on IV heparin Feels reporting better. Echo with right heart strain Right lower extremity with age-indeterminate DVT Starting eliquis   #2 history of PTSD and bipolar schizophrenia continue lithium Abilify  #3 chronic constipation restart Linzess  #4 hypothyroidism on Synthroid TSH 2.16  #5 diabetes continue metformin A1c 5.2 CBG (last 3)  Recent Labs    09/25/22 1621 09/25/22 2043 09/26/22 0423  GLUCAP 109* 89 97     Estimated body mass index is 39.49 kg/m as calculated from the following:   Height as of this encounter: 5\' 5"  (1.651 m).   Weight as of this encounter: 107.6 kg.  DVT prophylaxis: Heparin Code Status: Full code Family Communication: Discussed with guardian/caregiver  disposition Plan:  Status is:  Inpatient Remains inpatient appropriate because: Acute PE submassive bilateral   Consultants:  PCCM and interventional radiology  Procedures: Thrombectomy Antimicrobials: None  Subjective: No c/o  Objective: Vitals:   09/25/22 1856 09/25/22 2046 09/26/22 0425 09/26/22 0801  BP:  100/65 124/73 (!) 101/59  Pulse: (!) 116 (!) 119 (!) 104 (!) 105  Resp:  19 18 18   Temp:  98.4 F (36.9 C) 98.4 F (36.9 C) 98.8 F (37.1 C)  TempSrc:  Oral Oral Oral  SpO2:  97% 97% 97%  Weight:   107.6 kg   Height:        Intake/Output Summary (Last 24 hours) at 09/26/2022 1111 Last data filed at 09/26/2022 0700 Gross per 24 hour  Intake 695.71 ml  Output --  Net 695.71 ml    Filed Weights   09/24/22 0438 09/25/22 0411 09/26/22 0425  Weight: 108.8 kg 107.4 kg 107.6 kg    Examination:  General exam: Appears in no acute distress Respiratory system: Scattered rhonchi to auscultation. Respiratory effort normal. Cardiovascular system: S1 & S2 heard, RRR. No JVD, murmurs, rubs, gallops or clicks. No pedal edema. Gastrointestinal system: Abdomen is nondistended, soft and nontender. No organomegaly or masses felt. Normal bowel sounds heard. Central nervous system: Alert and oriented. No focal neurological deficits. Extremities: Right lower extremity edema   Data Reviewed: I have personally reviewed following labs and imaging studies  CBC: Recent Labs  Lab 09/22/22 1722 09/22/22 1802 09/23/22 0154 09/24/22 0527 09/25/22 0250 09/26/22 0335  WBC 9.5  --  10.4 7.8 8.3 9.4  NEUTROABS 6.9  --   --   --   --   --   HGB 13.4 13.9 10.9* 10.4*  10.4* 11.4*  HCT 41.6 41.0 33.0* 31.5* 33.4* 34.6*  MCV 92.4  --  90.7 90.5 94.4 91.3  PLT 244  --  174 186 200 267    Basic Metabolic Panel: Recent Labs  Lab 09/22/22 1722 09/22/22 1802 09/23/22 0154 09/24/22 0527  NA 137 140 137 139  K 3.6 3.8 3.3* 3.8  CL 105 106 106 109  CO2 18*  --  20* 22  GLUCOSE 140* 141* 121* 124*  BUN 7 6 7  <5*   CREATININE 1.10* 0.90 0.89 0.78  CALCIUM 8.9  --  8.1* 8.4*  MG 1.9  --   --  1.7    GFR: Estimated Creatinine Clearance: 126.5 mL/min (by C-G formula based on SCr of 0.78 mg/dL). Liver Function Tests: Recent Labs  Lab 09/22/22 1722  AST 22  ALT 10  ALKPHOS 110  BILITOT 0.3  PROT 7.3  ALBUMIN 3.4*    No results for input(s): "LIPASE", "AMYLASE" in the last 168 hours. No results for input(s): "AMMONIA" in the last 168 hours. Coagulation Profile: Recent Labs  Lab 09/23/22 0154  INR 1.2    Cardiac Enzymes: No results for input(s): "CKTOTAL", "CKMB", "CKMBINDEX", "TROPONINI" in the last 168 hours. BNP (last 3 results) No results for input(s): "PROBNP" in the last 8760 hours. HbA1C: No results for input(s): "HGBA1C" in the last 72 hours.  CBG: Recent Labs  Lab 09/25/22 0826 09/25/22 1130 09/25/22 1621 09/25/22 2043 09/26/22 0423  GLUCAP 138* 107* 109* 89 97    Lipid Profile: No results for input(s): "CHOL", "HDL", "LDLCALC", "TRIG", "CHOLHDL", "LDLDIRECT" in the last 72 hours. Thyroid Function Tests: No results for input(s): "TSH", "T4TOTAL", "FREET4", "T3FREE", "THYROIDAB" in the last 72 hours.  Anemia Panel: No results for input(s): "VITAMINB12", "FOLATE", "FERRITIN", "TIBC", "IRON", "RETICCTPCT" in the last 72 hours. Sepsis Labs: Recent Labs  Lab 09/22/22 1747 09/22/22 1956  LATICACIDVEN 1.8 1.6     Recent Results (from the past 240 hour(s))  MRSA Next Gen by PCR, Nasal     Status: None   Collection Time: 09/22/22  9:32 PM   Specimen: Nasal Mucosa; Nasal Swab  Result Value Ref Range Status   MRSA by PCR Next Gen NOT DETECTED NOT DETECTED Final    Comment: (NOTE) The GeneXpert MRSA Assay (FDA approved for NASAL specimens only), is one component of a comprehensive MRSA colonization surveillance program. It is not intended to diagnose MRSA infection nor to guide or monitor treatment for MRSA infections. Test performance is not FDA approved in  patients less than 34 years old. Performed at [facility], [address], [place], [place] [address]          Radiology Studies: No results found.      Scheduled Meds:  amantadine  100 mg Oral BID   apixaban  10 mg Oral BID   Followed by   Melene Muller ON 10/03/2022] apixaban  5 mg Oral BID   ARIPiprazole  10 mg Oral QHS   insulin aspart  0-15 Units Subcutaneous Q4H   levothyroxine  100 mcg Oral Q0600   linaclotide  290 mcg Oral QAC breakfast   lithium carbonate  600 mg Oral QHS   metFORMIN  1,000 mg Oral Q supper   pantoprazole  40 mg Oral Daily   traZODone  300 mg Oral QHS   Continuous Infusions:     LOS: 4 days    Time spent: 38 min  Alwyn Ren, MD  09/26/2022, 11:11 AM

## 2022-09-26 NOTE — Progress Notes (Signed)
ANTICOAGULATION CONSULT NOTE- Follow Up  Pharmacy Consult for heparin infusion Indication: acute pulmonary embolus and RLE DVT  No Active Allergies   Patient Measurements: Height: 5\' 5"  (165.1 cm) Weight: 107.6 kg (237 lb 4.8 oz) IBW/kg (Calculated) : 57 Heparin Dosing Weight: 72.8 kg  Vital Signs: Temp: 98.8 F (37.1 C) (05/20 0801) Temp Source: Oral (05/20 0801) BP: 101/59 (05/20 0801) Pulse Rate: 105 (05/20 0801)  Labs: Recent Labs    09/24/22 0527 09/24/22 1358 09/25/22 0250 09/25/22 0801 09/26/22 0335  HGB 10.4*  --  10.4*  --  11.4*  HCT 31.5*  --  33.4*  --  34.6*  PLT 186  --  200  --  267  HEPARINUNFRC 0.19*   < > 0.72* 0.63 0.46  CREATININE 0.78  --   --   --   --    < > = values in this interval not displayed.     Estimated Creatinine Clearance: 126.5 mL/min (by C-G formula based on SCr of 0.78 mg/dL).   Assessment: Presented with SOB that started 5/15 PM with new onset AF RVR. CTA showed acute PE with moderate to large thrombus burden with RV strain. S/p mechanical thrombectomy in IR on 5/17 AM. Doppler showed RLE DVT.  -heparin level at goal on 1800 units/hr   Goal of Therapy:  Heparin level 0.3-0.7 units/ml Monitor platelets by anticoagulation protocol: Yes   Plan:  Continue heparin infusion 1800 units/hr Monitor daily CBC, heparin level F/u transition to enteral anticoag  Thank you for involving pharmacy in this patient's care.  Harland German, PharmD Clinical Pharmacist **Pharmacist phone directory can now be found on [email or web address] ([email or web address]).  Listed under [facility].

## 2022-09-26 NOTE — Discharge Instructions (Addendum)
Information on my medicine - ELIQUIS (apixaban)  Why was Eliquis prescribed for you? Eliquis was prescribed to treat blood clots that may have been found in the veins of your legs (deep vein thrombosis) or in your lungs (pulmonary embolism) and to reduce the risk of them occurring again.  What do You need to know about Eliquis ? The starting dose is 10 mg (two 5 mg tablets) taken TWICE daily for the FIRST SEVEN (7) DAYS, then on 10/03/22  the dose is reduced to ONE 5 mg tablet taken TWICE daily.  Eliquis may be taken with or without food.   Try to take the dose about the same time in the morning and in the evening. If you have difficulty swallowing the tablet whole please discuss with your pharmacist how to take the medication safely.  Take Eliquis exactly as prescribed and DO NOT stop taking Eliquis without talking to the doctor who prescribed the medication.  Stopping may increase your risk of developing a new blood clot.  Refill your prescription before you run out.  After discharge, you should have regular check-up appointments with your healthcare provider that is prescribing your Eliquis.    What do you do if you miss a dose? If a dose of ELIQUIS is not taken at the scheduled time, take it as soon as possible on the same day and twice-daily administration should be resumed. The dose should not be doubled to make up for a missed dose.  Important Safety Information A possible side effect of Eliquis is bleeding. You should call your healthcare provider right away if you experience any of the following: Bleeding from an injury or your nose that does not stop. Unusual colored urine (red or dark brown) or unusual colored stools (red or black). Unusual bruising for unknown reasons. A serious fall or if you hit your head (even if there is no bleeding).  Some medicines may interact with Eliquis and might increase your risk of bleeding or clotting while on Eliquis. To help avoid this,  consult your healthcare provider or pharmacist prior to using any new prescription or non-prescription medications, including herbals, vitamins, non-steroidal anti-inflammatory drugs (NSAIDs) and supplements.  This website has more information on Eliquis (apixaban): http://www.eliquis.com/eliquis/home

## 2022-09-26 NOTE — TOC Progression Note (Signed)
Transition of Care [facility]) - Progression Note    Patient Details  Name: [name] MRN: [id number] Date of Birth: Nov 02, 1995  [facility] CM/SW Contact  Delilah Shan, LCSWA Phone Number: 09/26/2022, 4:48 PM  Clinical Narrative:     CSW spoke with patients caretaker Lyn Hollingshead with [facility] in [place]. Lyn Hollingshead informed CSW that they will need DC summary and FL2 when medically ready for dc. CSW will follow up with caretaker tomorrow for Fax number to send DC summary and FL2 for patient. Caretaker plans on picking patient up when medically ready for dc. Legal Guardian [name] [place] Ginette Otto [phone]. CSW spoke with patients legal guardian who confirmed plan for patient to return to her group home with her caregiver [place]. CSW will follow up with Encarnacion Slates and Maralyn Sago tomorrow. CSW will continue to follow and assist with patients dc planning needs.  Expected Discharge Plan: Group Home Barriers to Discharge: Continued Medical Work up  Expected Discharge Plan and Services In-house Referral: Clinical Social Work     Living arrangements for the past 2 months: Group Home                                       Social Determinants of Health (SDOH) Interventions SDOH Screenings   Tobacco Use: Low Risk  (09/23/2022)    Readmission Risk Interventions     No data to display

## 2022-09-26 NOTE — TOC Benefit Eligibility Note (Signed)
Patient Product/process development scientist completed.    The patient is currently admitted and upon discharge could be taking Eliquis.  The current 30 day co-pay is N/A due to recent fill of Eliquis (09/23/2022).   The patient is currently admitted and upon discharge could be taking Xarelto.  The current 30 day co-pay is N/A due to recent fill of Eliquis (09/23/2022).   The patient is insured through [facility] Medicaid   This test claim was processed through [facility]- copay amounts may vary at other pharmacies due to pharmacy/plan contracts, or as the patient moves through the different stages of their insurance plan.

## 2022-09-26 NOTE — Telephone Encounter (Signed)
Pharmacy Patient Advocate Encounter  Insurance verification completed.    The patient is insured through [place] Medicaid   The patient is currently admitted and ran test claims for the following: Eliquis, Xarelto.  Copays and coinsurance results were relayed to Inpatient clinical team.      

## 2022-09-27 DIAGNOSIS — I2609 Other pulmonary embolism with acute cor pulmonale: Secondary | ICD-10-CM | POA: Diagnosis not present

## 2022-09-27 LAB — GLUCOSE, CAPILLARY
Glucose-Capillary: 101 mg/dL — ABNORMAL HIGH (ref 70–99)
Glucose-Capillary: 82 mg/dL (ref 70–99)
Glucose-Capillary: 86 mg/dL (ref 70–99)
Glucose-Capillary: 98 mg/dL (ref 70–99)

## 2022-09-27 LAB — CBC
HCT: 34.5 % — ABNORMAL LOW (ref 36.0–46.0)
Hemoglobin: 11.3 g/dL — ABNORMAL LOW (ref 12.0–15.0)
MCH: 30.2 pg (ref 26.0–34.0)
MCHC: 32.8 g/dL (ref 30.0–36.0)
MCV: 92.2 fL (ref 80.0–100.0)
Platelets: 269 10*3/uL (ref 150–400)
RBC: 3.74 MIL/uL — ABNORMAL LOW (ref 3.87–5.11)
RDW: 12.8 % (ref 11.5–15.5)
WBC: 8.9 10*3/uL (ref 4.0–10.5)
nRBC: 0 % (ref 0.0–0.2)

## 2022-09-27 MED ORDER — POLYETHYLENE GLYCOL 3350 17 G PO PACK: 17.0000 g | PACK | Freq: Every day | ORAL | 0 refills | Status: DC | PRN

## 2022-09-27 MED ORDER — FLUOXETINE HCL 20 MG PO CAPS: 20.0000 mg | ORAL_CAPSULE | Freq: Every day | ORAL | 0 refills | Status: DC

## 2022-09-27 MED ORDER — ACETAMINOPHEN 325 MG PO TABS: 650.0000 mg | ORAL_TABLET | ORAL | Status: DC | PRN

## 2022-09-27 MED ORDER — APIXABAN (ELIQUIS) VTE STARTER PACK (10MG AND 5MG): ORAL_TABLET | ORAL | 0 refills | Status: DC

## 2022-09-27 NOTE — NC FL2 (Addendum)
[place] MEDICAID FL2 LEVEL OF CARE FORM     IDENTIFICATION  Patient Name: Holly [name] Birthdate: Sep 07, 1995 Sex: female Admission Date (Current Location): 09/22/2022  [facility] and [place] Number:  Producer, television/film/video and Address:  [facility], [address], [place], [place] [address]      Provider Number: [id number]  Attending Physician Name and Address:  Alwyn Ren, MD  Relative Name and Phone Number:  [name]  (Legal Guardian) [phone], [name] (caretaker) [phone]  Current Level of Care: Hospital Recommended Level of Care: Other (Comment) (Group Home) Prior Approval Number:    Date Approved/Denied:   PASRR Number:    Discharge Plan: Other (Comment) (Group Home)    Current Diagnoses: Patient Active Problem List   Diagnosis Date Noted   Sinus tachycardia 09/26/2022   Elevated troponin 09/24/2022   Pulmonary embolism (HCC) 09/22/2022   Morbid obesity (HCC) 12/24/2015   Depression    Ingestion of foreign body 12/17/2015   Hallucinations    Suicidal ideation    Injury, self-inflicted    Schizoaffective disorder, bipolar type (HCC)    Mild intellectual disability    Psychosis (HCC) 03/18/2014   ADHD (attention deficit hyperactivity disorder) 01/03/2013   PTSD (post-traumatic stress disorder) 01/03/2013   Agitation 01/03/2013    Orientation RESPIRATION BLADDER Height & Weight     Self, Time, Situation, Place (WDL)  Normal Continent, External catheter (External Urinary Catheter) Weight: 236 lb 12.8 oz (107.4 kg) Height:  5\' 5"  (165.1 cm)  BEHAVIORAL SYMPTOMS/MOOD NEUROLOGICAL BOWEL NUTRITION STATUS      Continent (WDL) Diet: Carb Modified   AMBULATORY STATUS COMMUNICATION OF NEEDS Skin     Verbally Other (Comment) (WDL,Wound/Incision LDAs)                       Personal Care Assistance Level of Assistance              Functional Limitations Info  Sight, Hearing, Speech Sight Info:  Adequate Hearing Info: Adequate Speech Info: Adequate    SPECIAL CARE FACTORS FREQUENCY                       Contractures Contractures Info: Not present    Additional Factors Info  Code Status, Allergies, Psychotropic, Insulin Sliding Scale Code Status Info: FULL Allergies Info: NKA Psychotropic Info: ARIPiprazole (ABILIFY) tablet 10 mg daily at bedtime,traZODone (DESYREL) tablet 300 mg daily at bedtime,  lithium carbonate capsule 600 mg daily at bedtime Insulin Sliding Scale Info: insulin aspart (novoLOG) injection 0-15 Units every 4 hours       TAKE these medications     acetaminophen 325 MG tablet Commonly known as: TYLENOL Take 2 tablets (650 mg total) by mouth every 4 (four) hours as needed for fever or mild pain.    amantadine 100 MG capsule Commonly known as: SYMMETREL Take 1 capsule (100 mg total) by mouth 2 (two) times daily for 7 days. Takes 1 at 8AM and 1 at 5PM    Apixaban Starter Pack (10mg  and 5mg ) Commonly known as: ELIQUIS STARTER PACK Take as directed on package: start with two-5mg  tablets twice daily for 7 days. On day 8, switch to one-5mg  tablet twice daily.    ARIPiprazole 10 MG tablet Commonly known as: ABILIFY Take 10 mg by mouth daily.    levothyroxine 100 MCG tablet Commonly known as: SYNTHROID Take 1 tablet (100 mcg total) by mouth daily before breakfast  for 7 days. For low thyroid function What changed: additional instructions    Linzess 290 MCG Caps capsule Generic drug: linaclotide Take 290 mcg by mouth daily before breakfast.    lithium carbonate 300 MG ER tablet Commonly known as: LITHOBID Take 3 tablets (900 mg total) by mouth at bedtime for 7 days. For mood stabilization What changed:  how much to take additional instructions    metFORMIN 1000 MG tablet Commonly known as: GLUCOPHAGE Take 1,000 mg by mouth at bedtime. What changed: Another medication with the same name was removed. Continue taking this medication, and  follow the directions you see here.    omeprazole 20 MG capsule Commonly known as: PRILOSEC Take 20 mg by mouth daily.    polyethylene glycol 17 g packet Commonly known as: MIRALAX / GLYCOLAX Take 17 g by mouth daily as needed for moderate constipation.    traZODone 150 MG tablet Commonly known as: DESYREL Take 300 mg by mouth at bedtime. What changed: Another medication with the same name was removed. Continue taking this medication, and follow the directions you see here.      Relevant Imaging Results:  Relevant Lab Results:   Additional Information [id number]  Delilah Shan, LCSWA

## 2022-09-27 NOTE — TOC Transition Note (Addendum)
Transition of Care [facility]) - CM/SW Discharge Note   Patient Details  Name: NOLEEN BULICK MRN: [id number] Date of Birth: [birth year]  Transition of Care [facility]) CM/SW Contact:  Delilah Shan, LCSWA Phone Number: 09/27/2022, 12:57 PM   Clinical Narrative:      Lyn Hollingshead request for MD to send patients meds to [facility] in [place] electronically. CSW informed MD.   Patient will DC to: Group Home   Anticipated DC date: 09/27/2022  Family notified: Encarnacion Slates and Maralyn Sago  Transport by: Encarnacion Slates Caretaker   ?  Per MD patient ready for DC to Group Home . RN, patient, patient's  Legal Guardian, and Encarnacion Slates with Group Home notified of DC. Discharge Summary and FL2 sent to Group Home. RN given number for report [phone]. DC packet on chart. [place] caretaker to transport patient to Group Home.  CSW signing off.     Barriers to Discharge: Continued Medical Work up   Patient Goals and CMS Choice      Discharge Placement                         Discharge Plan and Services Additional resources added to the After Visit Summary for   In-house Referral: Clinical Social Work                                   Social Determinants of Health (SDOH) Interventions SDOH Screenings   Tobacco Use: Low Risk  (09/23/2022)     Readmission Risk Interventions     No data to display

## 2022-09-27 NOTE — Discharge Summary (Signed)
Physician Discharge Summary  Holly [name]:[id number] DOB: 1995/07/14 DOA: 09/22/2022  PCP: Renaye Rakers, MD  Admit date: 09/22/2022 Discharge date: 09/27/2022  Admitted From: Home Disposition: Skilled nursing facility Recommendations for Outpatient Follow-up:  Follow up with PCP in 1-2 weeks Please obtain BMP/CBC in one week Please follow up with urology for acute urinary retention   Home Health: None Equipment/Devices: None Discharge Condition: Stable CODE STATUS: Full code Diet recommendation: Carb modified Brief/Interim Summary:   27 year old female with history of bipolar disorder schizoaffective disorder PTSD insulin-dependent diabetes lives at home with her caregiver admitted with acute shortness of breath found to be in A-fib RVR on admission and has acute bilateral submassive PE with large clot burden with right heart strain.  Patient was admitted by [facility] 09/22/2022 transferred to [facility] on 09/24/2022.  She is status post thrombectomy This was thought to be secondary to Depo-Provera and morbid obesity.  He was treated with IV heparin and thrombectomy. Echocardiogram-ejection fraction 55 to 60%.  Interventricular septum is flattened consistent with right ventricular pressure overload.  Right ventricular systolic function is severely reduced.  Severe RV strain in the setting of acute PE. Venous Doppler of the lower extremities-right lower extremity DVT. Discharge Diagnoses:  Principal Problem:   Pulmonary embolism (HCC) Active Problems:   Elevated troponin   Sinus tachycardia      #1 acute bilateral pulmonary embolism submassive with right heart strain status post thrombectomy -she was admitted to the ICU started on IV heparin IR was consulted had thrombectomy done.  She was switched to Eliquis on the third day.  Echo showed normal ejection fraction with right heart strain.  Right lower extremity Doppler showed age-indeterminate DVT.  She was discharged on Eliquis for 6  months.  She was told to stop taking Depo-Provera which probably have caused PE and DVT.    #2 history of PTSD and bipolar schizophrenia continue lithium Abilify   #3 chronic constipation continue  Linzess   #4 hypothyroidism on Synthroid TSH 2.16   #5 diabetes continue metformin A1c 5.2   Estimated body mass index is 39.41 kg/m as calculated from the following:   Height as of this encounter: 5\' 5"  (1.651 m).   Weight as of this encounter: 107.4 kg.  Discharge Instructions  Discharge Instructions     Diet - low sodium heart healthy   Complete by: As directed    Increase activity slowly   Complete by: As directed    No wound care   Complete by: As directed       Allergies as of 09/27/2022   No Active Allergies      Medication List     STOP taking these medications    citalopram 20 MG tablet Commonly known as: CeleXA   clindamycin 300 MG capsule Commonly known as: Cleocin   FLUoxetine 20 MG capsule Commonly known as: PROZAC   medroxyPROGESTERone 150 MG/ML injection Commonly known as: DEPO-PROVERA       TAKE these medications    acetaminophen 325 MG tablet Commonly known as: TYLENOL Take 2 tablets (650 mg total) by mouth every 4 (four) hours as needed for fever or mild pain.   amantadine 100 MG capsule Commonly known as: SYMMETREL Take 1 capsule (100 mg total) by mouth 2 (two) times daily for 7 days. Takes 1 at 8AM and 1 at 5PM   Apixaban Starter Pack (10mg  and 5mg ) Commonly known as: ELIQUIS STARTER PACK Take as directed on package: start with two-5mg  tablets twice daily  for 7 days. On day 8, switch to one-5mg  tablet twice daily.   ARIPiprazole 10 MG tablet Commonly known as: ABILIFY Take 10 mg by mouth daily.   levothyroxine 100 MCG tablet Commonly known as: SYNTHROID Take 1 tablet (100 mcg total) by mouth daily before breakfast for 7 days. For low thyroid function What changed: additional instructions   Linzess 290 MCG Caps capsule Generic  drug: linaclotide Take 290 mcg by mouth daily before breakfast.   lithium carbonate 300 MG ER tablet Commonly known as: LITHOBID Take 3 tablets (900 mg total) by mouth at bedtime for 7 days. For mood stabilization What changed:  how much to take additional instructions   metFORMIN 1000 MG tablet Commonly known as: GLUCOPHAGE Take 1,000 mg by mouth at bedtime. What changed: Another medication with the same name was removed. Continue taking this medication, and follow the directions you see here.   omeprazole 20 MG capsule Commonly known as: PRILOSEC Take 20 mg by mouth daily.   polyethylene glycol 17 g packet Commonly known as: MIRALAX / GLYCOLAX Take 17 g by mouth daily as needed for moderate constipation.   traZODone 150 MG tablet Commonly known as: DESYREL Take 300 mg by mouth at bedtime. What changed: Another medication with the same name was removed. Continue taking this medication, and follow the directions you see here.        No Active Allergies  Consultations:[facility] IR   Procedures/Studies: IR THROMBECT PRIM MECH INIT (INCLU) MOD SED  Result Date: 09/23/2022 INDICATION: Submassive PE Briefly, 27 year old female with a history of schizoaffective disorder who presented with AFib with RVR with CTA demonstrating submassive PE. RV/LV ratio 1.7 EXAM: Procedures: 1. PULMONARY ARTERIOGRAPHY 2. CATHETER-DIRECTED MECHANICAL THROMBECTOMY COMPARISON:  Chest XR and CTA PE, 09/22/2022. MEDICATIONS: 50 mg Benadryl IV.  8K IU heparin IV. ANESTHESIA/SEDATION: Moderate (conscious) sedation was employed during this procedure. A total of Versed 2 mg and Fentanyl 100 mcg was administered intravenously. Moderate Sedation Time: 101 minutes. The patient's level of consciousness and vital signs were monitored continuously by radiology nursing throughout the procedure under my direct supervision. CONTRAST:  80mL OMNIPAQUE IOHEXOL 300 MG/ML SOLN, 40mL OMNIPAQUE IOHEXOL 300 MG/ML SOLN FLUOROSCOPY  TIME:  Fluoroscopic dose; 251 mGy COMPLICATIONS: None immediate. TECHNIQUE: Informed written consent was obtained from the patient and/or patient's representative after a discussion of the risks, benefits and alternatives to treatment. Questions regarding the procedure were encouraged and answered. A timeout was performed prior to the initiation of the procedure. Ultrasound scanning was performed of the RIGHT groin and demonstrated patency of the RIGHT common femoral and greater saphenous veins. The RIGHT groin was prepped and draped in the usual sterile fashion, and a sterile drape was applied covering the operative field. Maximum barrier sterile technique with sterile gowns and gloves were used for the procedure. A timeout was performed prior to the initiation of the procedure. Local anesthesia was provided with 1% lidocaine. Under direct ultrasound guidance, the RIGHT greater saphenous vein was accessed with a micro puncture kit ultimately allowing placement of a 7 Fr vascular sheath. Ultrasound and fluoroscopic spot images were saved for procedural documentation purposes. With the use of a 0.035 inch Bentson wire and an angled pigtail pulmonary catheter, access past the RIGHT heart and into the main pulmonary artery was obtained. Pre intervention central pulmonary arteriogram was performed. The Bentson wire was exchanged for an Amplatz wire then a 16 Fr, 33 cm GORE dry seal sheath then a 16 Fr Penumbra Lightning Flash aspiration  catheter was advanced into the RIGHT then LEFT pulmonary arteries and catheter directed thrombectomy was performed. Intermittent and postprocedural pulmonary arteriograms were performed during thrombus removal. Following adequate angiographic result and the catheter and sheath were then removed, and hemostasis was obtained by manual pressure. The patient tolerated the procedure well without immediate postprocedural complication. FINDINGS: *Central pulmonary arteriogram demonstrates  moderate burden of bilateral pulmonary emboli at the pulmonary arterial bifurcations, bilateral inferior lobar and the RIGHT superior lobar pulmonary artery. *Post interventional pulmonary arteriograms demonstrating adequate debulking thrombectomy from the bilateral inferior lobar pulmonary arteries. *Mild residual thrombus burden of the pulmonary arterial bifurcations. IMPRESSION: 1. Successful catheter-directed bilateral pulmonary arterial debulking mechanical thrombectomy. 2. Mild residual thrombus at the BILATERAL inferior lobar pulmonary arteries. PLAN: *Continue systemic anticoagulation with heparin gtt and transition to DOAC when clinically feasible. *The patient will be seen by me in the Vascular Interventional Radiology (VIR) clinic for postoperative follow-up, and including repeat RIGHT lower extremity venous duplex in 1 month. Roanna Banning, MD [facility] Radiology Electronically Signed   By: Roanna Banning M.D.   On: 09/23/2022 16:39   IR US Guide Vasc Access Right  Result Date: 09/23/2022 INDICATION: Submassive PE Briefly, 27 year old female with a history of schizoaffective disorder who presented with AFib with RVR with CTA demonstrating submassive PE. RV/LV ratio 1.7 EXAM: Procedures: 1. PULMONARY ARTERIOGRAPHY 2. CATHETER-DIRECTED MECHANICAL THROMBECTOMY COMPARISON:  Chest XR and CTA PE, 09/22/2022. MEDICATIONS: 50 mg Benadryl IV.  8K IU heparin IV. ANESTHESIA/SEDATION: Moderate (conscious) sedation was employed during this procedure. A total of Versed 2 mg and Fentanyl 100 mcg was administered intravenously. Moderate Sedation Time: 101 minutes. The patient's level of consciousness and vital signs were monitored continuously by radiology nursing throughout the procedure under my direct supervision. CONTRAST:  80mL OMNIPAQUE IOHEXOL 300 MG/ML SOLN, 40mL OMNIPAQUE IOHEXOL 300 MG/ML SOLN FLUOROSCOPY TIME:  Fluoroscopic dose; 251 mGy COMPLICATIONS: None  immediate. TECHNIQUE: Informed written consent was obtained from the patient and/or patient's representative after a discussion of the risks, benefits and alternatives to treatment. Questions regarding the procedure were encouraged and answered. A timeout was performed prior to the initiation of the procedure. Ultrasound scanning was performed of the RIGHT groin and demonstrated patency of the RIGHT common femoral and greater saphenous veins. The RIGHT groin was prepped and draped in the usual sterile fashion, and a sterile drape was applied covering the operative field. Maximum barrier sterile technique with sterile gowns and gloves were used for the procedure. A timeout was performed prior to the initiation of the procedure. Local anesthesia was provided with 1% lidocaine. Under direct ultrasound guidance, the RIGHT greater saphenous vein was accessed with a micro puncture kit ultimately allowing placement of a 7 Fr vascular sheath. Ultrasound and fluoroscopic spot images were saved for procedural documentation purposes. With the use of a 0.035 inch Bentson wire and an angled pigtail pulmonary catheter, access past the RIGHT heart and into the main pulmonary artery was obtained. Pre intervention central pulmonary arteriogram was performed. The Bentson wire was exchanged for an Amplatz wire then a 16 Fr, 33 cm GORE dry seal sheath then a 16 Fr Penumbra Lightning Flash aspiration catheter was advanced into the RIGHT then LEFT pulmonary arteries and catheter directed thrombectomy was performed. Intermittent and postprocedural pulmonary arteriograms were performed during thrombus removal. Following adequate angiographic result and the catheter and sheath were then removed, and hemostasis was obtained by manual pressure. The patient tolerated the procedure well without immediate postprocedural complication.  FINDINGS: *Central pulmonary arteriogram demonstrates moderate burden of bilateral pulmonary emboli at the  pulmonary arterial bifurcations, bilateral inferior lobar and the RIGHT superior lobar pulmonary artery. *Post interventional pulmonary arteriograms demonstrating adequate debulking thrombectomy from the bilateral inferior lobar pulmonary arteries. *Mild residual thrombus burden of the pulmonary arterial bifurcations. IMPRESSION: 1. Successful catheter-directed bilateral pulmonary arterial debulking mechanical thrombectomy. 2. Mild residual thrombus at the BILATERAL inferior lobar pulmonary arteries. PLAN: *Continue systemic anticoagulation with heparin gtt and transition to DOAC when clinically feasible. *The patient will be seen by me in the Vascular Interventional Radiology (VIR) clinic for postoperative follow-up, and including repeat RIGHT lower extremity venous duplex in 1 month. Roanna Banning, MD [facility] Radiology Electronically Signed   By: Roanna Banning M.D.   On: 09/23/2022 16:39   IR Angiogram Pulmonary Bilateral Selective  Result Date: 09/23/2022 INDICATION: Submassive PE Briefly, 27 year old female with a history of schizoaffective disorder who presented with AFib with RVR with CTA demonstrating submassive PE. RV/LV ratio 1.7 EXAM: Procedures: 1. PULMONARY ARTERIOGRAPHY 2. CATHETER-DIRECTED MECHANICAL THROMBECTOMY COMPARISON:  Chest XR and CTA PE, 09/22/2022. MEDICATIONS: 50 mg Benadryl IV.  8K IU heparin IV. ANESTHESIA/SEDATION: Moderate (conscious) sedation was employed during this procedure. A total of Versed 2 mg and Fentanyl 100 mcg was administered intravenously. Moderate Sedation Time: 101 minutes. The patient's level of consciousness and vital signs were monitored continuously by radiology nursing throughout the procedure under my direct supervision. CONTRAST:  80mL OMNIPAQUE IOHEXOL 300 MG/ML SOLN, 40mL OMNIPAQUE IOHEXOL 300 MG/ML SOLN FLUOROSCOPY TIME:  Fluoroscopic dose; 251 mGy COMPLICATIONS: None immediate. TECHNIQUE: Informed written  consent was obtained from the patient and/or patient's representative after a discussion of the risks, benefits and alternatives to treatment. Questions regarding the procedure were encouraged and answered. A timeout was performed prior to the initiation of the procedure. Ultrasound scanning was performed of the RIGHT groin and demonstrated patency of the RIGHT common femoral and greater saphenous veins. The RIGHT groin was prepped and draped in the usual sterile fashion, and a sterile drape was applied covering the operative field. Maximum barrier sterile technique with sterile gowns and gloves were used for the procedure. A timeout was performed prior to the initiation of the procedure. Local anesthesia was provided with 1% lidocaine. Under direct ultrasound guidance, the RIGHT greater saphenous vein was accessed with a micro puncture kit ultimately allowing placement of a 7 Fr vascular sheath. Ultrasound and fluoroscopic spot images were saved for procedural documentation purposes. With the use of a 0.035 inch Bentson wire and an angled pigtail pulmonary catheter, access past the RIGHT heart and into the main pulmonary artery was obtained. Pre intervention central pulmonary arteriogram was performed. The Bentson wire was exchanged for an Amplatz wire then a 16 Fr, 33 cm GORE dry seal sheath then a 16 Fr Penumbra Lightning Flash aspiration catheter was advanced into the RIGHT then LEFT pulmonary arteries and catheter directed thrombectomy was performed. Intermittent and postprocedural pulmonary arteriograms were performed during thrombus removal. Following adequate angiographic result and the catheter and sheath were then removed, and hemostasis was obtained by manual pressure. The patient tolerated the procedure well without immediate postprocedural complication. FINDINGS: *Central pulmonary arteriogram demonstrates moderate burden of bilateral pulmonary emboli at the pulmonary arterial bifurcations, bilateral  inferior lobar and the RIGHT superior lobar pulmonary artery. *Post interventional pulmonary arteriograms demonstrating adequate debulking thrombectomy from the bilateral inferior lobar pulmonary arteries. *Mild residual thrombus burden of the pulmonary arterial bifurcations. IMPRESSION: 1. Successful catheter-directed bilateral  pulmonary arterial debulking mechanical thrombectomy. 2. Mild residual thrombus at the BILATERAL inferior lobar pulmonary arteries. PLAN: *Continue systemic anticoagulation with heparin gtt and transition to DOAC when clinically feasible. *The patient will be seen by me in the Vascular Interventional Radiology (VIR) clinic for postoperative follow-up, and including repeat RIGHT lower extremity venous duplex in 1 month. Roanna Banning, MD [facility] Radiology Electronically Signed   By: Roanna Banning M.D.   On: 09/23/2022 16:39   ECHOCARDIOGRAM COMPLETE  Result Date: 09/23/2022    ECHOCARDIOGRAM REPORT   Patient Name:   Holly [name] Easton Date of Exam: 09/23/2022 Medical Rec #:  [id number]         Height:       60.0 in Accession #:    [id number]        Weight:       242.5 lb Date of Birth:  Sep 23, 1995         BSA:          2.026 m Patient Age:    27 years          BP:           110/79 mmHg Patient Gender: F                 HR:           101 bpm. Exam Location:  Inpatient Procedure: 2D Echo, Cardiac Doppler, Color Doppler and Intracardiac            Opacification Agent Indications:    Pulmonary embolus  History:        Patient has no prior history of Echocardiogram examinations.                 Signs/Symptoms:Dyspnea.  Sonographer:    Milda Smart Referring Phys: [id number] Ivor Costa MEIER  Sonographer Comments: Technically difficult study due to poor echo windows. Image acquisition challenging due to patient body habitus and Image acquisition challenging due to respiratory motion. IMPRESSIONS  1. Left ventricular ejection fraction, by  estimation, is 55 to 60%. The left ventricle has normal function. The left ventricle has no regional wall motion abnormalities. Left ventricular diastolic parameters were normal. There is the interventricular septum is flattened in systole, consistent with right ventricular pressure overload.  2. Right ventricular systolic function is severely reduced. The right ventricular size is severely enlarged. Tricuspid regurgitation signal is inadequate for assessing PA pressure.  3. The mitral valve is normal in structure. Trivial mitral valve regurgitation. No evidence of mitral stenosis.  4. The aortic valve is normal in structure. Aortic valve regurgitation is not visualized. No aortic stenosis is present. Conclusion(s)/Recommendation(s): There is severe RV strain in the setting of acute PE. FINDINGS  Left Ventricle: Left ventricular ejection fraction, by estimation, is 55 to 60%. The left ventricle has normal function. The left ventricle has no regional wall motion abnormalities. Definity contrast agent was given IV to delineate the left ventricular  endocardial borders. The left ventricular internal cavity size was normal in size. There is no left ventricular hypertrophy. The interventricular septum is flattened in systole, consistent with right ventricular pressure overload. Left ventricular diastolic parameters were normal. Right Ventricle: The right ventricular size is severely enlarged. No increase in right ventricular wall thickness. Right ventricular systolic function is severely reduced. Tricuspid regurgitation signal is inadequate for assessing PA pressure. Left Atrium: Left atrial size was normal in size. Right Atrium: Right atrial size was normal in size. Pericardium: There is  no evidence of pericardial effusion. Mitral Valve: The mitral valve is normal in structure. Trivial mitral valve regurgitation. No evidence of mitral valve stenosis. Tricuspid Valve: The tricuspid valve is normal in structure. Tricuspid  valve regurgitation is trivial. No evidence of tricuspid stenosis. Aortic Valve: The aortic valve is normal in structure. Aortic valve regurgitation is not visualized. No aortic stenosis is present. Pulmonic Valve: The pulmonic valve was normal in structure. Pulmonic valve regurgitation is trivial. No evidence of pulmonic stenosis. Aorta: The aortic root is normal in size and structure. Venous: The inferior vena cava was not well visualized. IAS/Shunts: No atrial level shunt detected by color flow Doppler.  LEFT VENTRICLE PLAX 2D LVIDd:         4.00 cm   Diastology LVIDs:         3.30 cm   LV e' medial:    6.74 cm/s LV PW:         1.00 cm   LV E/e' medial:  1.0 LV IVS:        0.80 cm   LV e' lateral:   15.10 cm/s LVOT diam:     2.10 cm   LV E/e' lateral: 0.4 LV SV:         45 LV SV Index:   22 LVOT Area:     3.46 cm  RIGHT VENTRICLE RV S prime:     8.05 cm/s TAPSE (M-mode): 1.3 cm LEFT ATRIUM             Index       RIGHT ATRIUM           Index LA diam:        2.70 cm 1.33 cm/m  RA Area:     11.50 cm LA Vol (A2C):   13.5 ml 6.66 ml/m  RA Volume:   25.60 ml  12.64 ml/m LA Vol (A4C):   15.1 ml 7.45 ml/m LA Biplane Vol: 16.3 ml 8.05 ml/m  AORTIC VALVE LVOT Vmax:   95.60 cm/s LVOT Vmean:  70.800 cm/s LVOT VTI:    0.129 m  AORTA Ao Root diam: 3.10 cm Ao Asc diam:  3.30 cm MV E velocity: 6.74 cm/s  TRICUSPID VALVE                           TR Peak grad:   14.3 mmHg                           TR Vmax:        189.00 cm/s                            SHUNTS                           Systemic VTI:  0.13 m                           Systemic Diam: 2.10 cm Arvilla Meres MD Electronically signed by Arvilla Meres MD Signature Date/Time: 09/23/2022/3:05:25 PM    Final    VAS [place] LOWER EXTREMITY VENOUS (DVT)  Result Date: 09/23/2022  Lower Venous DVT Study Patient Name:  ELLIONNA AURAND Bryngelson  Date of Exam:   09/23/2022 Medical Rec #: [id number]          Accession #:  [id number] Date of Birth: [birth year]          Patient  Gender: F Patient Age:   57 years Exam Location:  [facility] Procedure:      VAS [place] LOWER EXTREMITY VENOUS (DVT) Referring Phys: Felisa Bonier --------------------------------------------------------------------------------  Indications: Pulmonary embolism, and S/P thrombectomy for pulmonary embolis on 09/22/22.  Anticoagulation: Heparin. Performing Technologist: Marilynne Halsted RDMS, RVT  Examination Guidelines: A complete evaluation includes B-mode imaging, spectral Doppler, color Doppler, and power Doppler as needed of all accessible portions of each vessel. Bilateral testing is considered an integral part of a complete examination. Limited examinations for reoccurring indications may be performed as noted. The reflux portion of the exam is performed with the patient in reverse Trendelenburg.  +---------+---------------+---------+-----------+----------+-----------------+ RIGHT    CompressibilityPhasicitySpontaneityPropertiesThrombus Aging    +---------+---------------+---------+-----------+----------+-----------------+ CFV      Full           Yes      Yes                                    +---------+---------------+---------+-----------+----------+-----------------+ SFJ      Full                                                           +---------+---------------+---------+-----------+----------+-----------------+ FV Prox  Full                                                           +---------+---------------+---------+-----------+----------+-----------------+ FV Mid   Full                                                           +---------+---------------+---------+-----------+----------+-----------------+ FV DistalFull                                                           +---------+---------------+---------+-----------+----------+-----------------+ PFV      Full                                                            +---------+---------------+---------+-----------+----------+-----------------+ POP      None           No       No                   Age Indeterminate +---------+---------------+---------+-----------+----------+-----------------+ PTV      Partial  Age Indeterminate +---------+---------------+---------+-----------+----------+-----------------+ PERO     None                                         Age Indeterminate +---------+---------------+---------+-----------+----------+-----------------+   +---------+---------------+---------+-----------+----------+--------------+ LEFT     CompressibilityPhasicitySpontaneityPropertiesThrombus Aging +---------+---------------+---------+-----------+----------+--------------+ CFV      Full           Yes      Yes                                 +---------+---------------+---------+-----------+----------+--------------+ SFJ      Full                                                        +---------+---------------+---------+-----------+----------+--------------+ FV Prox  Full                                                        +---------+---------------+---------+-----------+----------+--------------+ FV Mid   Full                                                        +---------+---------------+---------+-----------+----------+--------------+ FV DistalFull                                                        +---------+---------------+---------+-----------+----------+--------------+ PFV      Full                                                        +---------+---------------+---------+-----------+----------+--------------+ POP      Full           Yes      Yes                                 +---------+---------------+---------+-----------+----------+--------------+ PTV      Full                                                         +---------+---------------+---------+-----------+----------+--------------+ PERO     Full                                                        +---------+---------------+---------+-----------+----------+--------------+  Summary: RIGHT: - Findings consistent with age indeterminate deep vein thrombosis involving the right popliteal vein, right posterior tibial veins, and right peroneal veins. - No cystic structure found in the popliteal fossa.  LEFT: - There is no evidence of deep vein thrombosis in the lower extremity.  - No cystic structure found in the popliteal fossa.  *See table(s) above for measurements and observations. Electronically signed by Gerarda Fraction on 09/23/2022 at 2:55:16 PM.    Final    CT Angio Chest PE W and/or Wo Contrast  Result Date: 09/22/2022 CLINICAL DATA:  High clinical suspicion for PE EXAM: CT ANGIOGRAPHY CHEST WITH CONTRAST TECHNIQUE: Multidetector CT imaging of the chest was performed using the standard protocol during bolus administration of intravenous contrast. Multiplanar CT image reconstructions and MIPs were obtained to evaluate the vascular anatomy. RADIATION DOSE REDUCTION: This exam was performed according to the departmental dose-optimization program which includes automated exposure control, adjustment of the mA and/or kV according to patient size and/or use of iterative reconstruction technique. CONTRAST:  58mL OMNIPAQUE IOHEXOL 350 MG/ML SOLN COMPARISON:  Chest radiograph done earlier today FINDINGS: Cardiovascular: There is homogeneous enhancement in thoracic aorta. There are intraluminal filling defects in lobar, segmental and subsegmental branches in both lungs. There is moderate to large thrombus burden. There is no demonstrable saddle embolus in the main pulmonary arteries. There is dilation of right ventricular cavity. RV LV ratio is 1.7. Main pulmonary artery measures 3.5 cm in diameter suggesting possible pulmonary arterial hypertension.  Mediastinum/Nodes: Small pericardial effusion is present. Lungs/Pleura: There are few scattered ground-glass densities in both lungs, more so in the left lower lung field. There is no focal consolidation. There is no pleural effusion or pneumothorax. Upper Abdomen: No acute findings are seen. Musculoskeletal: Unremarkable. Review of the MIP images confirms the above findings. IMPRESSION: Acute pulmonary embolism with moderate to large thrombus burden. Filling defects are seen in lobar, segmental and subsegmental branches in both lungs. RV LV ratio is 1.7 suggesting right ventricular strain. There is prominence of main pulmonary artery suggesting possible pulmonary arterial hypertension. There small scattered faint ground-glass densities in the lower lung fields, more so on the left side suggesting scarring or interstitial pneumonia or early changes of pulmonary infarction. There is no pleural effusion or pneumothorax. Imaging findings were relayed to patient's provider Dr. Dalene Seltzer by telephone call. Electronically Signed   By: Ernie Avena M.D.   On: 09/22/2022 19:48   DG Chest Portable 1 View  Result Date: 09/22/2022 CLINICAL DATA:  Chest pressure pain EXAM: PORTABLE CHEST 1 VIEW COMPARISON:  08/18/2015 FINDINGS: The heart size and mediastinal contours are within normal limits. Both lungs are clear. The visualized skeletal structures are unremarkable. IMPRESSION: No active disease. Electronically Signed   By: Jasmine Pang M.D.   On: 09/22/2022 18:06   (Echo, Carotid, EGD, Colonoscopy, ERCP)    Subjective:  ANXIOUS to go home no complaints tachycardic with exertion but better than before Discharge Exam: Vitals:   09/27/22 0441 09/27/22 0913  BP: (!) 88/61 100/63  Pulse:  (!) 109  Resp: 18 16  Temp: 98.7 F (37.1 C) 98.4 F (36.9 C)  SpO2: 96% 97%   Vitals:   09/26/22 2049 09/27/22 0025 09/27/22 0441 09/27/22 0913  BP: 93/66 (!) 99/56 (!) 88/61 100/63  Pulse: (!) 114   (!) 109   Resp: 16 19 18 16   Temp: 98.6 F (37 C) 98.6 F (37 C) 98.7 F (37.1 C) 98.4 F (36.9 C)  TempSrc: Oral Oral  Oral Oral  SpO2: 97%  96% 97%  Weight:   107.4 kg   Height:        General: Pt is alert, awake, not in acute distress Cardiovascular: RRR, S1/S2 +, no rubs, no gallops Respiratory: CTA bilaterally, no wheezing, no rhonchi Abdominal: Soft, NT, ND, bowel sounds + Extremities: no edema, no cyanosis    The results of significant diagnostics from this hospitalization (including imaging, microbiology, ancillary and laboratory) are listed below for reference.     Microbiology: Recent Results (from the past 240 hour(s))  MRSA Next Gen by PCR, Nasal     Status: None   Collection Time: 09/22/22  9:32 PM   Specimen: Nasal Mucosa; Nasal Swab  Result Value Ref Range Status   MRSA by PCR Next Gen NOT DETECTED NOT DETECTED Final    Comment: (NOTE) The GeneXpert MRSA Assay (FDA approved for NASAL specimens only), is one component of a comprehensive MRSA colonization surveillance program. It is not intended to diagnose MRSA infection nor to guide or monitor treatment for MRSA infections. Test performance is not FDA approved in patients less than 45 years old. Performed at [facility], [address], [place], [place] [address]      Labs: BNP (last 3 results) Recent Labs    09/22/22 1722  BNP 605.5*   Basic Metabolic Panel: Recent Labs  Lab 09/22/22 1722 09/22/22 1802 09/23/22 0154 09/24/22 0527  NA 137 140 137 139  K 3.6 3.8 3.3* 3.8  CL 105 106 106 109  CO2 18*  --  20* 22  GLUCOSE 140* 141* 121* 124*  BUN 7 6 7  <5*  CREATININE 1.10* 0.90 0.89 0.78  CALCIUM 8.9  --  8.1* 8.4*  MG 1.9  --   --  1.7   Liver Function Tests: Recent Labs  Lab 09/22/22 1722  AST 22  ALT 10  ALKPHOS 110  BILITOT 0.3  PROT 7.3  ALBUMIN 3.4*   No results for input(s): "LIPASE", "AMYLASE" in the last 168 hours. No results for input(s): "AMMONIA" in the last 168  hours. CBC: Recent Labs  Lab 09/22/22 1722 09/22/22 1802 09/23/22 0154 09/24/22 0527 09/25/22 0250 09/26/22 0335 09/27/22 0219  WBC 9.5  --  10.4 7.8 8.3 9.4 8.9  NEUTROABS 6.9  --   --   --   --   --   --   HGB 13.4   < > 10.9* 10.4* 10.4* 11.4* 11.3*  HCT 41.6   < > 33.0* 31.5* 33.4* 34.6* 34.5*  MCV 92.4  --  90.7 90.5 94.4 91.3 92.2  PLT 244  --  174 186 200 267 269   < > = values in this interval not displayed.   Cardiac Enzymes: No results for input(s): "CKTOTAL", "CKMB", "CKMBINDEX", "TROPONINI" in the last 168 hours. BNP: Invalid input(s): "POCBNP" CBG: Recent Labs  Lab 09/26/22 1708 09/26/22 2046 09/27/22 0021 09/27/22 0438 09/27/22 0737  GLUCAP 108* 121* 98 82 86   D-Dimer No results for input(s): "DDIMER" in the last 72 hours. Hgb A1c No results for input(s): "HGBA1C" in the last 72 hours. Lipid Profile No results for input(s): "CHOL", "HDL", "LDLCALC", "TRIG", "CHOLHDL", "LDLDIRECT" in the last 72 hours. Thyroid function studies No results for input(s): "TSH", "T4TOTAL", "T3FREE", "THYROIDAB" in the last 72 hours.  Invalid input(s): "FREET3" Anemia work up No results for input(s): "VITAMINB12", "FOLATE", "FERRITIN", "TIBC", "IRON", "RETICCTPCT" in the last 72 hours. Urinalysis    Component Value Date/Time  COLORURINE YELLOW 08/18/2015 1652   APPEARANCEUR CLOUDY (A) 08/18/2015 1652   LABSPEC 1.009 08/18/2015 1652   PHURINE 6.5 08/18/2015 1652   GLUCOSEU NEGATIVE 08/18/2015 1652   HGBUR SMALL (A) 08/18/2015 1652   BILIRUBINUR NEGATIVE 08/18/2015 1652   KETONESUR NEGATIVE 08/18/2015 1652   PROTEINUR NEGATIVE 08/18/2015 1652   UROBILINOGEN 0.2 02/21/2015 1830   NITRITE NEGATIVE 08/18/2015 1652   LEUKOCYTESUR MODERATE (A) 08/18/2015 1652   Sepsis Labs Recent Labs  Lab 09/24/22 0527 09/25/22 0250 09/26/22 0335 09/27/22 0219  WBC 7.8 8.3 9.4 8.9   Microbiology Recent Results (from the past 240 hour(s))  MRSA Next Gen by PCR, Nasal      Status: None   Collection Time: 09/22/22  9:32 PM   Specimen: Nasal Mucosa; Nasal Swab  Result Value Ref Range Status   MRSA by PCR Next Gen NOT DETECTED NOT DETECTED Final    Comment: (NOTE) The GeneXpert MRSA Assay (FDA approved for NASAL specimens only), is one component of a comprehensive MRSA colonization surveillance program. It is not intended to diagnose MRSA infection nor to guide or monitor treatment for MRSA infections. Test performance is not FDA approved in patients less than 48 years old. Performed at [facility], [address], [place], [place] [address]      Time coordinating discharge: 39 minutes  SIGNED:  Alwyn Ren, MD  [facility] 09/27/2022, 12:03 PM

## 2023-01-24 ENCOUNTER — Ambulatory Visit
Admission: EM | Admit: 2023-01-24 | Discharge: 2023-01-24 | Disposition: A | Discharge status: Home / Self Care | Payer: MEDICAID | Attending: Internal Medicine | Admitting: Internal Medicine

## 2023-01-24 DIAGNOSIS — M2550 Pain in unspecified joint: Secondary | ICD-10-CM

## 2023-01-24 NOTE — ED Triage Notes (Signed)
Pt presents with c/o intermittent join pain x 3 days. States today the pain has not stopped.    Has no taken anything for relief.

## 2023-01-24 NOTE — ED Provider Notes (Signed)
[facility]    CSN: [id number] Arrival date & time: 01/24/23  1039      History   Chief Complaint Chief Complaint  Patient presents with   Joint Pain    HPI Holly [name] is a 27 y.o. female presents for joint pain.  Patient reports 3 days of joint pain on the right side of her body primarily her ankle, knee, wrist and elbow.  Denies any known injury or inciting event.  Denies any swelling or redness or warmth of the joints.  No fevers or chills.  States she feels well.  Does report she had similar symptoms on the right side of her body last week that resolved on its own without treatment.  She denies any medical history of autoimmune disorders such as lupus, rheumatoid.  She does have a complex medical history including bipolar, diabetes, hypothyroidism PE on anticoagulation.  She has not taken any OTC medications for her symptoms.  No other concerns at this time.  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   Obesity    PTSD (post-traumatic stress disorder)     Patient Active Problem List   Diagnosis Date Noted   Sinus tachycardia 09/26/2022   Elevated troponin 09/24/2022   Pulmonary embolism (HCC) 09/22/2022   Morbid obesity (HCC) 12/24/2015   Depression    Ingestion of foreign body 12/17/2015   Hallucinations    Suicidal ideation    Injury, self-inflicted    Schizoaffective disorder, bipolar type (HCC)    Mild intellectual disability    Psychosis (HCC) 03/18/2014   ADHD (attention deficit hyperactivity disorder) 01/03/2013   PTSD (post-traumatic stress disorder) 01/03/2013   Agitation 01/03/2013    Past Surgical History:  Procedure Laterality Date   IR ANGIOGRAM PULMONARY BILATERAL SELECTIVE  09/23/2022   IR THROMBECT PRIM MECH INIT  (INCLU) MOD SED  09/23/2022   IR US GUIDE VASC ACCESS RIGHT  09/23/2022   NO PAST SURGERIES     None      OB History   No obstetric history on file.      Home Medications    Prior to Admission medications   Medication Sig Start Date End Date Taking? Authorizing Provider  acetaminophen (TYLENOL) 325 MG tablet Take 2 tablets (650 mg total) by mouth every 4 (four) hours as needed for fever or mild pain. 09/27/22   Alwyn Ren, MD  amantadine (SYMMETREL) 100 MG capsule Take 1 capsule (100 mg total) by mouth 2 (two) times daily for 7 days. Takes 1 at 8AM and 1 at [facility] 05/09/22 09/23/22  Princess Bruins, DO  APIXABAN Everlene Balls) VTE STARTER PACK (10MG  AND 5MG ) Take as directed on package: start with two-5mg  tablets twice daily for 7 days. On day 8, switch to one-5mg  tablet twice daily. 09/27/22   Alwyn Ren, MD  ARIPiprazole (ABILIFY) 10 MG tablet Take 10 mg by mouth daily.    [provider]  FLUoxetine (PROZAC) 20 MG capsule Take 1 capsule (20 mg total) by mouth daily for 30 doses. For depression 09/27/22 10/27/22  Alwyn Ren, MD  levothyroxine (SYNTHROID) 100 MCG tablet Take 1 tablet (100 mcg total) by mouth daily before breakfast for 7 days. For  low thyroid function Patient taking differently: Take 100 mcg by mouth daily before breakfast. 05/09/22 09/23/22  Princess Bruins, DO  linaclotide (LINZESS) 290 MCG CAPS capsule Take 290 mcg by mouth daily before breakfast.    [provider]  lithium carbonate (LITHOBID) 300 MG ER tablet Take 3 tablets (900 mg total) by mouth at bedtime for 7 days. For mood stabilization Patient taking differently: Take 600 mg by mouth at bedtime. 05/09/22 09/23/22  Princess Bruins, DO  metFORMIN (GLUCOPHAGE) 1000 MG tablet Take 1,000 mg by mouth at bedtime.    [provider]  omeprazole (PRILOSEC) 20 MG capsule Take 20 mg by mouth daily.    [provider]  polyethylene glycol (MIRALAX / GLYCOLAX) 17 g packet Take 17 g by  mouth daily as needed for moderate constipation. 09/27/22   Alwyn Ren, MD  traZODone (DESYREL) 150 MG tablet Take 300 mg by mouth at bedtime.    [provider]    Family History Family History  Adopted: Yes    Social History Social History   Tobacco Use   Smoking status: Never   Smokeless tobacco: Never  Vaping Use   Vaping status: Never Used  Substance Use Topics   Alcohol use: No   Drug use: No     Allergies   Patient has no known allergies.   Review of Systems Review of Systems  Musculoskeletal:  Positive for arthralgias.     Physical Exam Triage Vital Signs ED Triage Vitals  Encounter Vitals Group     BP 01/24/23 1057 111/76     Systolic BP Percentile --      Diastolic BP Percentile --      Pulse Rate 01/24/23 1057 87     Resp 01/24/23 1057 16     Temp 01/24/23 1057 (!) 97.4 F (36.3 C)     Temp Source 01/24/23 1057 Oral     SpO2 01/24/23 1057 97 %     Weight --      Height --      Head Circumference --      Peak Flow --      Pain Score 01/24/23 1102 4     Pain Loc --      Pain Education --      Exclude from Growth Chart --    No data found.  Updated Vital Signs BP 111/76 (BP Location: Right Arm)   Pulse 87   Temp (!) 97.4 F (36.3 C) (Oral)   Resp 16   LMP 01/09/2023 (Exact Date)   SpO2 97%   Visual Acuity Right Eye Distance:   Left Eye Distance:   Bilateral Distance:    Right Eye Near:   Left Eye Near:    Bilateral Near:     Physical Exam Vitals and nursing note reviewed.  Constitutional:      General: She is not in acute distress.    Appearance: Normal appearance. She is obese. She is not ill-appearing.  HENT:     Head: Normocephalic and atraumatic.  Eyes:     Pupils: Pupils are equal, round, and reactive to light.  Cardiovascular:     Rate and Rhythm: Normal rate.  Pulmonary:     Effort: Pulmonary effort is normal.  Musculoskeletal:     Left elbow: No swelling or deformity. Normal range of motion. No  tenderness.     Left wrist: No swelling, deformity, tenderness or snuff box tenderness. Normal range of motion.     Left  knee: No swelling or ecchymosis. Normal range of motion. No tenderness.     Left ankle: No swelling or ecchymosis. No tenderness. Normal range of motion.  Skin:    General: Skin is warm and dry.  Neurological:     General: No focal deficit present.     Mental Status: She is alert and oriented to person, place, and time.  Psychiatric:        Mood and Affect: Mood normal.        Behavior: Behavior normal.      UC Treatments / Results  Labs (all labs ordered are listed, but only abnormal results are displayed) Labs Reviewed - No data to display  EKG   Radiology No results found.  Procedures Procedures (including critical care time)  Medications Ordered in UC Medications - No data to display  Initial Impression / Assessment and Plan / UC Course  I have reviewed the triage vital signs and the nursing notes.  Pertinent labs & imaging results that were available during my care of the patient were reviewed by me and considered in my medical decision making (see chart for details).     Reviewed symptoms patient regarding.  Patient with transient joint pain on left side of body that last week was on the right side.  Exam is unremarkable.  Discussed potential causes including underlying autoimmune conditions.  Advised to likely need blood work and further workup of symptoms with either PCP.  Offered to start blood work here but patient preferred to just have it all done with the PCP.  Patient and guardian are in agreement with plan and will follow-up with her PCP.  She may take Tylenol as needed for the joint pain.  No NSAIDs as she is on Eliquis.  Alternating warm and cool compresses as needed.  ER precautions reviewed and patient guarding verbalized understanding. Final Clinical Impressions(s) / UC Diagnoses   Final diagnoses:  Arthralgia, unspecified joint      Discharge Instructions      Please follow-up with your PCP for further workup of your joint pain.  You may take Tylenol as needed.  You may alternate cool and warm compresses to the joints as well.  I do hope you feel better soon!     ED Prescriptions   None    PDMP not reviewed this encounter.   Radford Pax, NP 01/24/23 1145

## 2023-01-24 NOTE — Discharge Instructions (Signed)
Please follow-up with your PCP for further workup of your joint pain.  You may take Tylenol as needed.  You may alternate cool and warm compresses to the joints as well.  I do hope you feel better soon!

## 2023-02-23 ENCOUNTER — Ambulatory Visit (HOSPITAL_COMMUNITY)
Admission: EM | Admit: 2023-02-23 | Discharge: 2023-02-23 | Disposition: A | Discharge status: Other Acute Inpatient Hospital | Payer: MEDICAID | Attending: Psychiatry | Admitting: Psychiatry

## 2023-02-23 ENCOUNTER — Emergency Department (HOSPITAL_COMMUNITY)
Admission: EM | Admit: 2023-02-23 | Discharge: 2023-02-24 | Disposition: A | Discharge status: Home / Self Care | Payer: MEDICAID | Attending: Emergency Medicine | Admitting: Emergency Medicine

## 2023-02-23 DIAGNOSIS — X788XXA Intentional self-harm by other sharp object, initial encounter: Secondary | ICD-10-CM | POA: Insufficient documentation

## 2023-02-23 DIAGNOSIS — S50812A Abrasion of left forearm, initial encounter: Secondary | ICD-10-CM | POA: Insufficient documentation

## 2023-02-23 DIAGNOSIS — F3162 Bipolar disorder, current episode mixed, moderate: Secondary | ICD-10-CM | POA: Insufficient documentation

## 2023-02-23 DIAGNOSIS — F0630 Mood disorder due to known physiological condition, unspecified: Secondary | ICD-10-CM | POA: Diagnosis not present

## 2023-02-23 DIAGNOSIS — S0081XA Abrasion of other part of head, initial encounter: Secondary | ICD-10-CM | POA: Diagnosis not present

## 2023-02-23 DIAGNOSIS — T1491XA Suicide attempt, initial encounter: Secondary | ICD-10-CM | POA: Diagnosis not present

## 2023-02-23 DIAGNOSIS — T161XXA Foreign body in right ear, initial encounter: Secondary | ICD-10-CM | POA: Insufficient documentation

## 2023-02-23 DIAGNOSIS — R440 Auditory hallucinations: Secondary | ICD-10-CM

## 2023-02-23 DIAGNOSIS — R45851 Suicidal ideations: Secondary | ICD-10-CM

## 2023-02-23 DIAGNOSIS — T490X2A Poisoning by local antifungal, anti-infective and anti-inflammatory drugs, intentional self-harm, initial encounter: Secondary | ICD-10-CM | POA: Diagnosis not present

## 2023-02-23 NOTE — ED Provider Notes (Signed)
[facility] Continuous Assessment Admission H&P  Date: 02/24/23 Patient Name: Holly [name] MRN: [id number] Chief Complaint: I drank hand sanitizer to kill myself  Diagnoses:  Final diagnoses:  Suicidal ideation  Mood disorder in conditions classified elsewhere  Bipolar 1 disorder, mixed, moderate (HCC)    HPI: Holly [name], 27 y/o female with a history of IDD, bipolar disorder, schizophrenia, mood disorder.  Presented to [facility] via [facility].  Per the patient I hear voices and I will drink a bottle of hand sanitizer tonight because I want to kill myself because I was hearing voices.  Per the patient she got the hands sanitizer from the [employer] last week.  When asked what the size of the bottle patient estimated that it was a large bottle of an sanitizer.  Patient currently stays at the group home and stated that she does not want to stay there anymore.  Patient is currently taking medications however patient is not a good historian and does not know what medication she is taking.  Patient also does not know who her legal guardian is at the group home.  TTS try to contact the group home however no one answers the phone so there for we could not get any collaborative collateral.  Face-to-face observation of patient, patient is alert and oriented x 4, speech is clear, maintained minimal eye contact.  Patient does appear to be very depressed and anxious.  Patient is observed rocking back and forth on the chair.  Patient endorsed suicidal ideation that she took the hand sanitizer to kill herself.  Patient denies HI, reports she hears voices telling her to kill herself.  Patient denies alcohol use denies illicit drug use denies smoking.  Denies access to guns at this time.  At this time patient does appear to be influenced by internal stimuli.  Due to the fact that patient has drunk a bottle of fun sanitizer, the ED was notified and spoke with Dr. Donnald Garre MD who will be accepting the patient  for medical clearance.  Patient will be sent to [facility]  Total Time spent with patient: 30 minutes  Musculoskeletal  Strength & Muscle Tone: within normal limits Gait & Station: normal Patient leans: N/A  Psychiatric Specialty Exam  Presentation General Appearance:  Casual  Eye Contact: Fair  Speech: Clear and Coherent  Speech Volume: Normal  Handedness: Right   Mood and Affect  Mood: Anxious; Depressed  Affect: Congruent   Thought Process  Thought Processes: Coherent  Descriptions of Associations:Circumstantial  Orientation:Full (Time, Place and Person)  Thought Content:WDL    Hallucinations:Hallucinations: Other (comment); Auditory Description of Auditory Hallucinations: voices telling her to kill herself  Ideas of Reference:None  Suicidal Thoughts:Suicidal Thoughts: Yes, Active SI Active Intent and/or Plan: With Intent; With Plan SI Passive Intent and/or Plan: With Intent; With Plan  Homicidal Thoughts:Homicidal Thoughts: No   Sensorium  Memory: Immediate Fair  Judgment: Poor  Insight: Fair   Chartered certified accountant: Fair  Attention Span: Fair  Recall: [employer] of Knowledge: Fair  Language: Fair   Psychomotor Activity  Psychomotor Activity: Psychomotor Activity: Increased   Assets  Assets: Desire for Improvement; Social Support   Sleep  Sleep: Sleep: Fair Number of Hours of Sleep: 6   Nutritional Assessment (For OBS and FBC admissions only) Has the patient had a weight loss or gain of 10 pounds or more in the last 3 months?: No Has the patient had a decrease in food intake/or appetite?: No Does  the patient have dental problems?: No Does the patient have eating habits or behaviors that may be indicators of an eating disorder including binging or inducing vomiting?: No Has the patient recently lost weight without trying?: 0 Has the patient been eating poorly because of a decreased appetite?:  0 Malnutrition Screening Tool Score: 0    Physical Exam HENT:     Head: Normocephalic.     Nose: Nose normal.  Eyes:     Pupils: Pupils are equal, round, and reactive to light.  Cardiovascular:     Rate and Rhythm: Normal rate.  Pulmonary:     Effort: Pulmonary effort is normal.  Musculoskeletal:        General: Normal range of motion.     Cervical back: Normal range of motion.  Neurological:     General: No focal deficit present.     Mental Status: She is alert.  Psychiatric:        Mood and Affect: Mood normal.        Behavior: Behavior normal.        Thought Content: Thought content normal.        Judgment: Judgment normal.    Review of Systems  Constitutional: Negative.   HENT: Negative.    Eyes: Negative.   Respiratory: Negative.    Cardiovascular: Negative.   Gastrointestinal: Negative.   Genitourinary: Negative.   Musculoskeletal: Negative.   Skin: Negative.   Neurological: Negative.   Psychiatric/Behavioral:  Positive for hallucinations and suicidal ideas. The patient is nervous/anxious.     Blood pressure (!) 150/82, pulse (!) 118, temperature 98.7 F (37.1 C), temperature source Oral, resp. rate 18, last menstrual period 01/09/2023, SpO2 100%. There is no height or weight on file to calculate BMI.  Past Psychiatric History: Mood disorder, bipolar disorder, ADHD, schizophrenia, IDD.  Is the patient at risk to self? Yes  Has the patient been a risk to self in the past 6 months? No .    Has the patient been a risk to self within the distant past? No   Is the patient a risk to others? No   Has the patient been a risk to others in the past 6 months? No   Has the patient been a risk to others within the distant past? No   Past Medical History: See chart  Family History: unknown   Social History: unknown  Last Labs:  Admission on 02/23/2023  Component Date Value Ref Range Status   Sodium 02/23/2023 137  135 - 145 mmol/L Final   Potassium 02/23/2023  3.4 (L)  3.5 - 5.1 mmol/L Final   Chloride 02/23/2023 106  98 - 111 mmol/L Final   CO2 02/23/2023 21 (L)  22 - 32 mmol/L Final   Glucose, Bld 02/23/2023 120 (H)  70 - 99 mg/dL Final   Glucose reference range applies only to samples taken after fasting for at least 8 hours.   BUN 02/23/2023 <5 (L)  6 - 20 mg/dL Final   REPEATED TO VERIFY   Creatinine, Ser 02/23/2023 0.80  0.44 - 1.00 mg/dL Final   Calcium 11/91/4782 9.1  8.9 - 10.3 mg/dL Final   Total Protein 95/62/1308 7.3  6.5 - 8.1 g/dL Final   Albumin 65/78/4696 3.7  3.5 - 5.0 g/dL Final   AST 29/52/8413 17  15 - 41 U/L Final   ALT 02/23/2023 6  0 - 44 U/L Final   Alkaline Phosphatase 02/23/2023 93  38 - 126 U/L Final  Total Bilirubin 02/23/2023 0.6  0.3 - 1.2 mg/dL Final   GFR, Estimated 02/23/2023 >60  >60 mL/min Final   Comment: (NOTE) Calculated using the CKD-EPI Creatinine Equation (2021)    Anion gap 02/23/2023 10  5 - 15 Final   Performed at [facility], [address] [place], [place] [address]   Alcohol, Ethyl (B) 02/23/2023 <10  <10 mg/dL Final   Comment: (NOTE) Lowest detectable limit for serum alcohol is 10 mg/dL.  For medical purposes only. Performed at [facility], [address] [place], [place] [address]    WBC 02/23/2023 8.1  4.0 - 10.5 K/uL Final   RBC 02/23/2023 4.02  3.87 - 5.11 MIL/uL Final   Hemoglobin 02/23/2023 11.7 (L)  12.0 - 15.0 g/dL Final   HCT 86/57/8469 36.6  36.0 - 46.0 % Final   MCV 02/23/2023 91.0  80.0 - 100.0 fL Final   MCH 02/23/2023 29.1  26.0 - 34.0 pg Final   MCHC 02/23/2023 32.0  30.0 - 36.0 g/dL Final   RDW 62/95/2841 15.4  11.5 - 15.5 % Final   Platelets 02/23/2023 378  150 - 400 K/uL Final   nRBC 02/23/2023 0.0  0.0 - 0.2 % Final   Performed at [facility], [address] [place], [place] [address]   Opiates 02/23/2023 NONE DETECTED  NONE DETECTED Final   Cocaine 02/23/2023 NONE DETECTED  NONE DETECTED Final   Benzodiazepines 02/23/2023 NONE DETECTED   NONE DETECTED Final   Amphetamines 02/23/2023 NONE DETECTED  NONE DETECTED Final   Tetrahydrocannabinol 02/23/2023 NONE DETECTED  NONE DETECTED Final   Barbiturates 02/23/2023 NONE DETECTED  NONE DETECTED Final   Comment: (NOTE) DRUG SCREEN FOR MEDICAL PURPOSES ONLY.  IF CONFIRMATION IS NEEDED FOR ANY PURPOSE, NOTIFY LAB WITHIN 5 DAYS.  LOWEST DETECTABLE LIMITS FOR URINE DRUG SCREEN Drug Class                     Cutoff (ng/mL) Amphetamine and metabolites    1000 Barbiturate and metabolites    200 Benzodiazepine                 200 Opiates and metabolites        300 Cocaine and metabolites        300 THC                            50 Performed at [facility], [address] [place], [place] [address]    Preg, Serum 02/23/2023 NEGATIVE  NEGATIVE Final   Comment:        THE SENSITIVITY OF THIS METHODOLOGY IS >10 mIU/mL. Performed at [facility], [address] [place], [place] [address]    Osmolality 02/23/2023 286  275 - 295 mOsm/kg Final   Comment: REPEATED TO VERIFY Performed at [facility], [address], [place], [place] [address]   Admission on 09/22/2022, Discharged on 09/27/2022  Component Date Value Ref Range Status   WBC 09/22/2022 9.5  4.0 - 10.5 K/uL Final   RBC 09/22/2022 4.50  3.87 - 5.11 MIL/uL Final   Hemoglobin 09/22/2022 13.4  12.0 - 15.0 g/dL Final   HCT 42/59/5638 41.6  36.0 - 46.0 % Final   MCV 09/22/2022 92.4  80.0 - 100.0 fL Final   MCH 09/22/2022 29.8  26.0 - 34.0 pg Final   MCHC 09/22/2022 32.2  30.0 -  36.0 g/dL Final   RDW 13/12/6576 13.2  11.5 - 15.5 % Final   Platelets 09/22/2022 244  150 - 400 K/uL Final   nRBC 09/22/2022 0.0  0.0 - 0.2 % Final   Neutrophils Relative % 09/22/2022 72  % Final   Neutro Abs 09/22/2022 6.9  1.7 - 7.7 K/uL Final   Lymphocytes Relative 09/22/2022 16  % Final   Lymphs Abs 09/22/2022 1.5  0.7 - 4.0 K/uL Final   Monocytes Relative 09/22/2022 10  % Final   Monocytes Absolute 09/22/2022 1.0  0.1  - 1.0 K/uL Final   Eosinophils Relative 09/22/2022 1  % Final   Eosinophils Absolute 09/22/2022 0.1  0.0 - 0.5 K/uL Final   Basophils Relative 09/22/2022 1  % Final   Basophils Absolute 09/22/2022 0.1  0.0 - 0.1 K/uL Final   Immature Granulocytes 09/22/2022 0  % Final   Abs Immature Granulocytes 09/22/2022 0.03  0.00 - 0.07 K/uL Final   Performed at [facility], [address] [place], [place] [address]   Sodium 09/22/2022 137  135 - 145 mmol/L Final   Potassium 09/22/2022 3.6  3.5 - 5.1 mmol/L Final   Chloride 09/22/2022 105  98 - 111 mmol/L Final   CO2 09/22/2022 18 (L)  22 - 32 mmol/L Final   Glucose, Bld 09/22/2022 140 (H)  70 - 99 mg/dL Final   Glucose reference range applies only to samples taken after fasting for at least 8 hours.   BUN 09/22/2022 7  6 - 20 mg/dL Final   Creatinine, Ser 09/22/2022 1.10 (H)  0.44 - 1.00 mg/dL Final   Calcium 95/28/4132 8.9  8.9 - 10.3 mg/dL Final   Total Protein 44/05/270 7.3  6.5 - 8.1 g/dL Final   Albumin 53/66/4403 3.4 (L)  3.5 - 5.0 g/dL Final   AST 47/42/5956 22  15 - 41 U/L Final   ALT 09/22/2022 10  0 - 44 U/L Final   Alkaline Phosphatase 09/22/2022 110  38 - 126 U/L Final   Total Bilirubin 09/22/2022 0.3  0.3 - 1.2 mg/dL Final   GFR, Estimated 09/22/2022 >60  >60 mL/min Final   Comment: (NOTE) Calculated using the CKD-EPI Creatinine Equation (2021)    Anion gap 09/22/2022 14  5 - 15 Final   Performed at [facility], [address] [place], [place] [address]   Lactic Acid, Venous 09/22/2022 1.8  0.5 - 1.9 mmol/L Final   Performed at [facility], [address], [place], [place] [address]   Lactic Acid, Venous 09/22/2022 1.6  0.5 - 1.9 mmol/L Final   Performed at [facility], [address], [place], [place] [address]   TSH 09/22/2022 2.160  0.350 - 4.500 uIU/mL Final   Comment: Performed by a 3rd Generation assay with a functional sensitivity of <=0.01 uIU/mL. Performed at [facility], [address], [place], [place] [address]    Free T4 09/22/2022 1.40 (H)  0.61 - 1.12 ng/dL Final   Comment: (NOTE) Biotin ingestion may interfere with free T4 tests. If the results are inconsistent with the TSH level, previous test results, or the clinical presentation, then consider biotin interference. If needed, order repeat testing after stopping biotin. Performed at [facility], [address] [place], [place] [address]    Magnesium 09/22/2022 1.9  1.7 - 2.4 mg/dL Final   Performed at [facility], [address] [place], [place] [address]  I-stat hCG, quantitative 09/22/2022 <5.0  <5 mIU/mL Final   Comment 3 09/22/2022          Final   Comment:   GEST. AGE      CONC.  (mIU/mL)   <=1 WEEK        5 - 50     2 WEEKS       50 - 500     3 WEEKS       100 - 10,000     4 WEEKS     1,000 - 30,000        FEMALE AND NON-PREGNANT FEMALE:     LESS THAN 5 mIU/mL    Troponin I (High Sensitivity) 09/22/2022 144 (HH)  <18 ng/L Final   Comment: CRITICAL RESULT CALLED TO, READ BACK BY AND VERIFIED WITH R.GONZALEZ,RN @1902  09/22/2022 VANG.J (NOTE) Elevated high sensitivity troponin I (hsTnI) values and significant  changes across serial measurements may suggest ACS but many other  chronic and acute conditions are known to elevate hsTnI results.  Refer to the "Links" section for chest pain algorithms and additional  guidance. Performed at [facility], [address] [place], [place] [address]    Sodium 09/22/2022 140  135 - 145 mmol/L Final   Potassium 09/22/2022 3.8  3.5 - 5.1 mmol/L Final   Chloride 09/22/2022 106  98 - 111 mmol/L Final   BUN 09/22/2022 6  6 - 20 mg/dL Final   Creatinine, Ser 09/22/2022 0.90  0.44 - 1.00 mg/dL Final   Glucose, Bld 93/71/6967 141 (H)  70 - 99 mg/dL Final   Glucose reference range applies only to samples taken after fasting for at least 8 hours.   Calcium, Ion 09/22/2022 1.09 (L)  1.15 - 1.40 mmol/L Final   TCO2 09/22/2022 20 (L)  22 - 32  mmol/L Final   Hemoglobin 09/22/2022 13.9  12.0 - 15.0 g/dL Final   HCT 89/38/1017 41.0  36.0 - 46.0 % Final   B Natriuretic Peptide 09/22/2022 605.5 (H)  0.0 - 100.0 pg/mL Final   Performed at [facility], [address] [place], [place] [address]   Troponin I (High Sensitivity) 09/22/2022 158 (HH)  <18 ng/L Final   Comment: CRITICAL VALUE NOTED. VALUE IS CONSISTENT WITH PREVIOUSLY REPORTED/CALLED VALUE (NOTE) Elevated high sensitivity troponin I (hsTnI) values and significant  changes across serial measurements may suggest ACS but many other  chronic and acute conditions are known to elevate hsTnI results.  Refer to the "Links" section for chest pain algorithms and additional  guidance. Performed at [facility], [address], [place], [place] [address]    Heparin Unfractionated 09/23/2022 1.08 (H)  0.30 - 0.70 IU/mL Final   Comment: (NOTE) The clinical reportable range upper limit is being lowered to >1.10 to align with the FDA approved guidance for the current laboratory assay.  If heparin results are below expected values, and patient dosage has  been confirmed, suggest follow up testing of antithrombin III levels. Performed at [facility], [address], [place], [place] [address]    WBC 09/23/2022 10.4  4.0 - 10.5 K/uL Final   RBC 09/23/2022 3.64 (L)  3.87 - 5.11 MIL/uL Final   Hemoglobin 09/23/2022 10.9 (L)  12.0 - 15.0 g/dL Final   HCT 53/61/4431 33.0 (L)  36.0 - 46.0 % Final   MCV 09/23/2022 90.7  80.0 - 100.0 fL Final   MCH 09/23/2022 29.9  26.0 - 34.0 pg  Final   MCHC 09/23/2022 33.0  30.0 - 36.0 g/dL Final   RDW 25/36/6440 13.2  11.5 - 15.5 % Final   Platelets 09/23/2022 174  150 - 400 K/uL Final   nRBC 09/23/2022 0.0  0.0 - 0.2 % Final   Performed at [facility], [address] [place], [place] [address]   HIV Screen 4th Generation wRfx 09/23/2022 Non Reactive  Non Reactive Final   Performed at [facility], [address], [place], [place] [address]   Sodium 09/23/2022 137  135 - 145 mmol/L Final   Potassium 09/23/2022 3.3 (L)  3.5 - 5.1 mmol/L Final   Chloride 09/23/2022 106  98 - 111 mmol/L Final   CO2 09/23/2022 20 (L)  22 - 32 mmol/L Final   Glucose, Bld 09/23/2022 121 (H)  70 - 99 mg/dL Final   Glucose reference range applies only to samples taken after fasting for at least 8 hours.   BUN 09/23/2022 7  6 - 20 mg/dL Final   Creatinine, Ser 09/23/2022 0.89  0.44 - 1.00 mg/dL Final   Calcium 87/56/4332 8.1 (L)  8.9 - 10.3 mg/dL Final   GFR, Estimated 09/23/2022 >60  >60 mL/min Final   Comment: (NOTE) Calculated using the CKD-EPI Creatinine Equation (2021)    Anion gap 09/23/2022 11  5 - 15 Final   Performed at [facility], [address] [place], [place] [address]   Weight 09/23/2022 3,880.1  oz Final   Height 09/23/2022 60  in Final   BP 09/23/2022 101/78  mmHg Final   S' Lateral 09/23/2022 3.30  cm Final   Est EF 09/23/2022 55 - 60%   Final   Hgb A1c MFr Bld 09/23/2022 5.2  4.8 - 5.6 % Final   Comment: (NOTE) Pre diabetes:          5.7%-6.4%  Diabetes:              >6.4%  Glycemic control for   <7.0% adults with diabetes    Mean Plasma Glucose 09/23/2022 102.54  mg/dL Final   Performed at [facility], [address], [place], [place] [address]   aPTT 09/23/2022 189 (HH)  24 - 36 seconds Final   Comment:        IF BASELINE aPTT IS ELEVATED, SUGGEST PATIENT RISK ASSESSMENT BE USED TO DETERMINE APPROPRIATE ANTICOAGULANT THERAPY. CRITICAL RESULT CALLED TO, READ BACK BY AND VERIFIED WITH: Marcelyn Ditty RN 09/23/22 0230 Enid Derry REPEATED TO VERIFY Performed at [facility], [address], [place], [place] [address]    Prothrombin Time 09/23/2022 15.3 (H)  11.4 - 15.2 seconds Final   INR 09/23/2022 1.2  0.8 - 1.2 Final   Comment: (NOTE) INR goal varies based on device and disease states. Performed at [facility], [address] [place], [place] [address]    MRSA  by PCR Next Gen 09/22/2022 NOT DETECTED  NOT DETECTED Final   Comment: (NOTE) The GeneXpert MRSA Assay (FDA approved for NASAL specimens only), is one component of a comprehensive MRSA colonization surveillance program. It is not intended to diagnose MRSA infection nor to guide or monitor treatment for MRSA infections. Test performance is not FDA approved in patients less than 58 years old. Performed at [facility], [address], [place], [place] [address]    Glucose-Capillary 09/23/2022 115 (H)  70 - 99 mg/dL Final   Glucose reference range applies only to  samples taken after fasting for at least 8 hours.   SURGICAL PATHOLOGY 09/23/2022    Final-Edited                   Value:SURGICAL PATHOLOGY CASE: [id number] PATIENT: Darrien Trudel Surgical Pathology Report     Clinical History: PE (cm)     FINAL MICROSCOPIC DIAGNOSIS:  A. CLOT, RIGHT LOWER EXTREMITY: - Consistent with thrombus   GROSS DESCRIPTION:  A. Received fresh and subsequently placed in formalin labeled with the patient's name and DOB is a 3.1 x 2.3 x 1.5 cm aggregate of red-brown, serpiginous blood clot. A representative portion is submitted in a single cassette.  (LEF 09/23/2022)   Final Diagnosis performed by Clifton James, MD.   Electronically signed 09/26/2022 Technical component performed at [facility], [address], [place], [place] [address].  Professional component performed at [facility], [address], [place], [place] [address].  Immunohistochemistry Technical component (if applicable) was performed at [facility]. [address], [place], [place] [address].   IMMUNOHISTOCHEMIS                         TRY DISCLAIMER (if applicable): Some of these immunohistochemical stains may have been developed and the performance characteristics determine by [facility]. Some may not have been cleared or  approved by the U.S. Food and Drug Administration. The FDA has determined that such clearance or approval is not necessary. This test is used for clinical purposes. It should not be regarded as investigational or for research. This laboratory is certified under the Clinical Laboratory Improvement Amendments of 1988 (CLIA-88) as qualified to perform high complexity clinical laboratory testing.  The controls stained appropriately.    Heparin Unfractionated 09/23/2022 0.68  0.30 - 0.70 IU/mL Final   Comment: (NOTE) The clinical reportable range upper limit is being lowered to >1.10 to align with the FDA approved guidance for the current laboratory assay.  If heparin results are below expected values, and patient dosage has  been confirmed, suggest follow up testing of antithrombin III levels. Performed at [facility], [address], [place], [place] [address]    aPTT 09/23/2022 82 (H)  24 - 36 seconds Final   Comment:        IF BASELINE aPTT IS ELEVATED, SUGGEST PATIENT RISK ASSESSMENT BE USED TO DETERMINE APPROPRIATE ANTICOAGULANT THERAPY. Performed at [facility], [address], [place], [place] [address]    Glucose-Capillary 09/23/2022 103 (H)  70 - 99 mg/dL Final   Glucose reference range applies only to samples taken after fasting for at least 8 hours.   Heparin Unfractionated 09/23/2022 0.25 (L)  0.30 - 0.70 IU/mL Final   Comment: (NOTE) The clinical reportable range upper limit is being lowered to >1.10 to align with the FDA approved guidance for the current laboratory assay.  If heparin results are below expected values, and patient dosage has  been confirmed, suggest follow up testing of antithrombin III levels. Performed at [facility], [address] [place], [place] [address]    Glucose-Capillary 09/23/2022 99  70 - 99 mg/dL Final   Glucose reference range applies only to samples taken after fasting for at least 8 hours.   Glucose-Capillary  09/22/2022 117 (H)  70 - 99 mg/dL Final   Glucose reference range applies only to samples taken after fasting for at least 8 hours.  Glucose-Capillary 09/23/2022 128 (H)  70 - 99 mg/dL Final   Glucose reference range applies only to samples taken after fasting for at least 8 hours.   Heparin Unfractionated 09/23/2022 0.23 (L)  0.30 - 0.70 IU/mL Final   Comment: (NOTE) The clinical reportable range upper limit is being lowered to >1.10 to align with the FDA approved guidance for the current laboratory assay.  If heparin results are below expected values, and patient dosage has  been confirmed, suggest follow up testing of antithrombin III levels. Performed at [facility], [address], [place], [place] [address]    Glucose-Capillary 09/23/2022 120 (H)  70 - 99 mg/dL Final   Glucose reference range applies only to samples taken after fasting for at least 8 hours.   WBC 09/24/2022 7.8  4.0 - 10.5 K/uL Final   RBC 09/24/2022 3.48 (L)  3.87 - 5.11 MIL/uL Final   Hemoglobin 09/24/2022 10.4 (L)  12.0 - 15.0 g/dL Final   HCT 41/66/0630 31.5 (L)  36.0 - 46.0 % Final   MCV 09/24/2022 90.5  80.0 - 100.0 fL Final   MCH 09/24/2022 29.9  26.0 - 34.0 pg Final   MCHC 09/24/2022 33.0  30.0 - 36.0 g/dL Final   RDW 16/05/930 13.4  11.5 - 15.5 % Final   Platelets 09/24/2022 186  150 - 400 K/uL Final   nRBC 09/24/2022 0.0  0.0 - 0.2 % Final   Performed at [facility], [address], [place], [place] [address]   Sodium 09/24/2022 139  135 - 145 mmol/L Final   Potassium 09/24/2022 3.8  3.5 - 5.1 mmol/L Final   Chloride 09/24/2022 109  98 - 111 mmol/L Final   CO2 09/24/2022 22  22 - 32 mmol/L Final   Glucose, Bld 09/24/2022 124 (H)  70 - 99 mg/dL Final   Glucose reference range applies only to samples taken after fasting for at least 8 hours.   BUN 09/24/2022 <5 (L)  6 - 20 mg/dL Final   Creatinine, Ser 09/24/2022 0.78  0.44 - 1.00 mg/dL Final   Calcium 22/06/5425 8.4 (L)  8.9 - 10.3  mg/dL Final   GFR, Estimated 09/24/2022 >60  >60 mL/min Final   Comment: (NOTE) Calculated using the CKD-EPI Creatinine Equation (2021)    Anion gap 09/24/2022 8  5 - 15 Final   Performed at [facility], [address] [place], [place] [address]   Magnesium 09/24/2022 1.7  1.7 - 2.4 mg/dL Final   Performed at [facility], [address] [place], [place] [address]   Glucose-Capillary 09/23/2022 110 (H)  70 - 99 mg/dL Final   Glucose reference range applies only to samples taken after fasting for at least 8 hours.   Heparin Unfractionated 09/24/2022 0.19 (L)  0.30 - 0.70 IU/mL Final   Comment: (NOTE) The clinical reportable range upper limit is being lowered to >1.10 to align with the FDA approved guidance for the current laboratory assay.  If heparin results are below expected values, and patient dosage has  been confirmed, suggest follow up testing of antithrombin III levels. Performed at [facility], [address], [place], [place] [address]    Glucose-Capillary 09/24/2022 121 (H)  70 - 99 mg/dL Final   Glucose reference range applies only to samples taken after fasting for at least 8 hours.   Glucose-Capillary 09/24/2022 116 (H)  70 - 99 mg/dL Final   Glucose reference range applies only  to samples taken after fasting for at least 8 hours.   Heparin Unfractionated 09/24/2022 0.39  0.30 - 0.70 IU/mL Final   Comment: (NOTE) The clinical reportable range upper limit is being lowered to >1.10 to align with the FDA approved guidance for the current laboratory assay.  If heparin results are below expected values, and patient dosage has  been confirmed, suggest follow up testing of antithrombin III levels. Performed at [facility], [address] [place], [place] [address]    Glucose-Capillary 09/24/2022 165 (H)  70 - 99 mg/dL Final   Glucose reference range applies only to samples taken after fasting for at least 8 hours.   Glucose-Capillary  09/24/2022 85  70 - 99 mg/dL Final   Glucose reference range applies only to samples taken after fasting for at least 8 hours.   Heparin Unfractionated 09/24/2022 0.44  0.30 - 0.70 IU/mL Final   Comment: (NOTE) The clinical reportable range upper limit is being lowered to >1.10 to align with the FDA approved guidance for the current laboratory assay.  If heparin results are below expected values, and patient dosage has  been confirmed, suggest follow up testing of antithrombin III levels. Performed at [facility], [address], [place], [place] [address]    Glucose-Capillary 09/24/2022 114 (H)  70 - 99 mg/dL Final   Glucose reference range applies only to samples taken after fasting for at least 8 hours.   WBC 09/25/2022 8.3  4.0 - 10.5 K/uL Final   RBC 09/25/2022 3.54 (L)  3.87 - 5.11 MIL/uL Final   Hemoglobin 09/25/2022 10.4 (L)  12.0 - 15.0 g/dL Final   HCT 65/78/4696 33.4 (L)  36.0 - 46.0 % Final   MCV 09/25/2022 94.4  80.0 - 100.0 fL Final   MCH 09/25/2022 29.4  26.0 - 34.0 pg Final   MCHC 09/25/2022 31.1  30.0 - 36.0 g/dL Final   RDW 29/52/8413 13.0  11.5 - 15.5 % Final   Platelets 09/25/2022 200  150 - 400 K/uL Final   nRBC 09/25/2022 0.0  0.0 - 0.2 % Final   Performed at [facility], [address] [place], [place] [address]   Heparin Unfractionated 09/25/2022 0.72 (H)  0.30 - 0.70 IU/mL Final   Comment: (NOTE) The clinical reportable range upper limit is being lowered to >1.10 to align with the FDA approved guidance for the current laboratory assay.  If heparin results are below expected values, and patient dosage has  been confirmed, suggest follow up testing of antithrombin III levels. Performed at [facility], [address], [place], [place] [address]    Glucose-Capillary 09/24/2022 119 (H)  70 - 99 mg/dL Final   Glucose reference range applies only to samples taken after fasting for at least 8 hours.   Glucose-Capillary 09/24/2022 108 (H)   70 - 99 mg/dL Final   Glucose reference range applies only to samples taken after fasting for at least 8 hours.   Glucose-Capillary 09/25/2022 83  70 - 99 mg/dL Final   Glucose reference range applies only to samples taken after fasting for at least 8 hours.   Heparin Unfractionated 09/25/2022 0.63  0.30 - 0.70 IU/mL Final   Comment: (NOTE) The clinical reportable range upper limit is being lowered to >1.10 to align with the FDA approved guidance for the current laboratory assay.  If heparin results are below expected values, and patient dosage has  been confirmed, suggest follow up testing of  antithrombin III levels. Performed at [facility], [address] [place], [place] [address]    Glucose-Capillary 09/25/2022 138 (H)  70 - 99 mg/dL Final   Glucose reference range applies only to samples taken after fasting for at least 8 hours.   Glucose-Capillary 09/25/2022 107 (H)  70 - 99 mg/dL Final   Glucose reference range applies only to samples taken after fasting for at least 8 hours.   Glucose-Capillary 09/25/2022 109 (H)  70 - 99 mg/dL Final   Glucose reference range applies only to samples taken after fasting for at least 8 hours.   WBC 09/26/2022 9.4  4.0 - 10.5 K/uL Final   RBC 09/26/2022 3.79 (L)  3.87 - 5.11 MIL/uL Final   Hemoglobin 09/26/2022 11.4 (L)  12.0 - 15.0 g/dL Final   HCT 95/62/1308 34.6 (L)  36.0 - 46.0 % Final   MCV 09/26/2022 91.3  80.0 - 100.0 fL Final   MCH 09/26/2022 30.1  26.0 - 34.0 pg Final   MCHC 09/26/2022 32.9  30.0 - 36.0 g/dL Final   RDW 65/78/4696 13.0  11.5 - 15.5 % Final   Platelets 09/26/2022 267  150 - 400 K/uL Final   nRBC 09/26/2022 0.0  0.0 - 0.2 % Final   Performed at [facility], [address] [place], [place] [address]   Heparin Unfractionated 09/26/2022 0.46  0.30 - 0.70 IU/mL Final   Comment: (NOTE) The clinical reportable range upper limit is being lowered to >1.10 to align with the FDA approved guidance for the current  laboratory assay.  If heparin results are below expected values, and patient dosage has  been confirmed, suggest follow up testing of antithrombin III levels. Performed at [facility], [name], [place], [place] [address]    Glucose-Capillary 09/25/2022 89  70 - 99 mg/dL Final   Glucose reference range applies only to samples taken after fasting for at least 8 hours.   Glucose-Capillary 09/26/2022 97  70 - 99 mg/dL Final   Glucose reference range applies only to samples taken after fasting for at least 8 hours.   Glucose-Capillary 09/26/2022 113 (H)  70 - 99 mg/dL Final   Glucose reference range applies only to samples taken after fasting for at least 8 hours.   WBC 09/27/2022 8.9  4.0 - 10.5 K/uL Final   RBC 09/27/2022 3.74 (L)  3.87 - 5.11 MIL/uL Final   Hemoglobin 09/27/2022 11.3 (L)  12.0 - 15.0 g/dL Final   HCT 40/02/2724 34.5 (L)  36.0 - 46.0 % Final   MCV 09/27/2022 92.2  80.0 - 100.0 fL Final   MCH 09/27/2022 30.2  26.0 - 34.0 pg Final   MCHC 09/27/2022 32.8  30.0 - 36.0 g/dL Final   RDW 36/64/4034 12.8  11.5 - 15.5 % Final   Platelets 09/27/2022 269  150 - 400 K/uL Final   nRBC 09/27/2022 0.0  0.0 - 0.2 % Final   Performed at [facility], [address] [place], [place] [address]   Glucose-Capillary 09/26/2022 108 (H)  70 - 99 mg/dL Final   Glucose reference range applies only to samples taken after fasting for at least 8 hours.   Glucose-Capillary 09/26/2022 121 (H)  70 - 99 mg/dL Final   Glucose reference range applies only to samples taken after fasting for at least 8 hours.   Glucose-Capillary 09/27/2022 98  70 - 99 mg/dL Final   Glucose reference range applies only to samples  taken after fasting for at least 8 hours.   Glucose-Capillary 09/27/2022 82  70 - 99 mg/dL Final   Glucose reference range applies only to samples taken after fasting for at least 8 hours.   Glucose-Capillary 09/27/2022 86  70 - 99 mg/dL Final   Glucose reference range applies  only to samples taken after fasting for at least 8 hours.   Glucose-Capillary 09/27/2022 101 (H)  70 - 99 mg/dL Final   Glucose reference range applies only to samples taken after fasting for at least 8 hours.    Allergies: Patient has no known allergies.  Medications:  PTA Medications  Medication Sig   linaclotide (LINZESS) 290 MCG CAPS capsule Take 290 mcg by mouth daily before breakfast.   ARIPiprazole (ABILIFY) 10 MG tablet Take 10 mg by mouth daily.   metFORMIN (GLUCOPHAGE) 1000 MG tablet Take 1,000 mg by mouth at bedtime.   traZODone (DESYREL) 150 MG tablet Take 300 mg by mouth at bedtime.   omeprazole (PRILOSEC) 20 MG capsule Take 20 mg by mouth daily.   acetaminophen (TYLENOL) 325 MG tablet Take 2 tablets (650 mg total) by mouth every 4 (four) hours as needed for fever or mild pain.   polyethylene glycol (MIRALAX / GLYCOLAX) 17 g packet Take 17 g by mouth daily as needed for moderate constipation.   APIXABAN (ELIQUIS) VTE STARTER PACK (10MG  AND 5MG ) Take as directed on package: start with two-5mg  tablets twice daily for 7 days. On day 8, switch to one-5mg  tablet twice daily.      Medical Decision Making  Will be sent to [facility] for medical clearance     Recommendations  Based on my evaluation the patient appears to have an emergency medical condition for which I recommend the patient be transferred to the emergency department for further evaluation.  Sindy Guadeloupe, NP 02/24/23  5:54 AM

## 2023-02-23 NOTE — Progress Notes (Signed)
   02/23/23 2041  [facility] Triage Screening (Walk-ins at [facility] only)  How Did You Hear About [place]?  ([employer])  What Is the Reason for Your Visit/Call Today? Pt is a 27 yo female who was brought to [facility] by [employer] voluintarily due to having suicidal ideation. Pt reports she ran away from her AFL because she was feeling suicidal. Pt states, " I don't want to be here anymore." Pt reports that she drank an entire bottle of hand sanitizer and used a piece of glass to make superficial cuts on her forehead and forearm. Pt reports that she is currently hearing voices telling her to harm herself. Pt reports that she has hx of ADHD, Bipolar, and Schizophrenia. Pt denies HI. Pt denies alcohol and drug use. Pt reports that she is compliant with her medications. Pt is currently not active with therapy services. Pt reports that she does not feel safe to return to AFL at this time.  How Long Has This Been Causing You Problems? > than 6 months  Have You Recently Had Any Thoughts About Hurting Yourself? Yes  How long ago did you have thoughts about hurting yourself? "For a while"  Are You Planning to Commit Suicide/Harm Yourself At This time? Yes (Pt reports that she drank a bottle of hand sanitizer)  Have you Recently Had Thoughts About Hurting Someone [name]? No  Are You Planning To Harm Someone At This Time? No  Are you currently experiencing any auditory, visual or other hallucinations? Yes  Please explain the hallucinations you are currently experiencing: Hearing voices telling her to harmself  Have You Used Any Alcohol or Drugs in the Past 24 Hours? No  Do you have any current medical co-morbidities that require immediate attention? No  Clinician description of patient physical appearance/behavior: Pt was restless but cooperative  What Do You Feel Would Help You the Most Today? Treatment for Depression or other mood problem;Social Support;Stress Management  If access to [facility] was not available, would you have sought  care in the Emergency Department? Yes  Determination of Need Urgent (48 hours)  Options For Referral Inpatient Hospitalization    Flowsheet Row ED from 02/23/2023 in [facility] ED from 01/24/2023 in [facility] at [facility]) ED to Hosp-Admission (Discharged) from 09/22/2022 in [facility]  C-SSRS RISK CATEGORY High Risk No Risk No Risk
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
## 2023-02-23 NOTE — ED Triage Notes (Signed)
Sent from [facility], Pt is being primarily sent for ingestion of hand sanitizer this afternoon 1830, assumed to be around 20oz of hand sanitizer. She also has an eraser in both of her ears, and both of her nostrils. She also is hearing voices for 2 weeks and they have gotten worse tonight, as well as her anxiety has been worsening.   Medic vitals   132/ palp 106hr 16rr 98%ra 120bgl

## 2023-02-24 ENCOUNTER — Other Ambulatory Visit: Payer: Self-pay

## 2023-02-24 DIAGNOSIS — R45851 Suicidal ideations: Secondary | ICD-10-CM | POA: Diagnosis not present

## 2023-02-24 LAB — RAPID URINE DRUG SCREEN, HOSP PERFORMED
Amphetamines: NOT DETECTED
Barbiturates: NOT DETECTED
Benzodiazepines: NOT DETECTED
Cocaine: NOT DETECTED
Opiates: NOT DETECTED
Tetrahydrocannabinol: NOT DETECTED

## 2023-02-24 LAB — COMPREHENSIVE METABOLIC PANEL
ALT: 6 U/L (ref 0–44)
AST: 17 U/L (ref 15–41)
Albumin: 3.7 g/dL (ref 3.5–5.0)
Alkaline Phosphatase: 93 U/L (ref 38–126)
Anion gap: 10 (ref 5–15)
BUN: <5 mg/dL — ABNORMAL LOW (ref 6–20)
CO2: 21 mmol/L — ABNORMAL LOW (ref 22–32)
Calcium: 9.1 mg/dL (ref 8.9–10.3)
Chloride: 106 mmol/L (ref 98–111)
Creatinine, Ser: 0.8 mg/dL (ref 0.44–1.00)
GFR, Estimated: >60 mL/min (ref >60)
Glucose, Bld: 120 mg/dL — ABNORMAL HIGH (ref 70–99)
Potassium: 3.4 mmol/L — ABNORMAL LOW (ref 3.5–5.1)
Sodium: 137 mmol/L (ref 135–145)
Total Bilirubin: 0.6 mg/dL (ref 0.3–1.2)
Total Protein: 7.3 g/dL (ref 6.5–8.1)

## 2023-02-24 LAB — CBC
HCT: 36.6 % (ref 36.0–46.0)
Hemoglobin: 11.7 g/dL — ABNORMAL LOW (ref 12.0–15.0)
MCH: 29.1 pg (ref 26.0–34.0)
MCHC: 32 g/dL (ref 30.0–36.0)
MCV: 91 fL (ref 80.0–100.0)
Platelets: 378 10*3/uL (ref 150–400)
RBC: 4.02 MIL/uL (ref 3.87–5.11)
RDW: 15.4 % (ref 11.5–15.5)
WBC: 8.1 10*3/uL (ref 4.0–10.5)
nRBC: 0 % (ref 0.0–0.2)

## 2023-02-24 LAB — ETHANOL: Alcohol, Ethyl (B): <10 mg/dL (ref <10)

## 2023-02-24 LAB — LITHIUM LEVEL: Lithium Lvl: 0.14 mmol/L — ABNORMAL LOW (ref 0.60–1.20)

## 2023-02-24 LAB — HCG, SERUM, QUALITATIVE: Preg, Serum: NEGATIVE

## 2023-02-24 LAB — OSMOLALITY: Osmolality: 286 mosm/kg (ref 275–295)

## 2023-02-24 MED ORDER — TRAZODONE HCL 50 MG PO TABS: 150.0000 mg | ORAL_TABLET | Freq: Every day | ORAL | Status: DC

## 2023-02-24 MED ORDER — CITALOPRAM HYDROBROMIDE 10 MG PO TABS: 20.0000 mg | ORAL_TABLET | Freq: Every day | ORAL | Status: DC

## 2023-02-24 MED ORDER — LITHIUM CARBONATE ER 300 MG PO TBCR: 300.0000 mg | EXTENDED_RELEASE_TABLET | Freq: Two times a day (BID) | ORAL | Status: DC

## 2023-02-24 MED ORDER — ACETAMINOPHEN 500 MG PO TABS
1000.0000 mg | ORAL_TABLET | Freq: Once | ORAL | Status: AC
  Administered 2023-02-24: 1000 mg via ORAL
  Filled 2023-02-24: qty 2

## 2023-02-24 NOTE — ED Notes (Signed)
Pt requesting Tylenol 1000mg  for back pain. States this is all that she is able to take. MD Jearld Fenton made aware, verbal order for dose.

## 2023-02-24 NOTE — ED Provider Notes (Signed)
Emergency Medicine Observation Re-evaluation Note  SKYLEY WI is a 27 y.o. female, seen on rounds today.  Pt initially presented to the ED for complaints of Ingestion Currently, the patient is awake.  She had just walked over to the toilet/bathroom with her toiletries.  Nursing staff have no acute concerns, they indicate that patient has demonstrated some psychotic behavior.  Physical Exam  BP 103/71 (BP Location: Right Arm)   Pulse (!) 102   Temp 97.8 F (36.6 C) (Oral)   Resp 16   SpO2 100%  Physical Exam General: No distress Cardiac: Regular rate Lungs: No respiratory distress Psych: Cooperative  ED Course / MDM  EKG:   I have reviewed the labs performed to date as well as medications administered while in observation.  Recent changes in the last 24 hours include - 27 y/o female with a history of IDD, bipolar disorder, schizophrenia, mood disorder -being held for psychiatric stabilization  Plan  Current plan is for holding patient for admission/stabilization.    Derwood Kaplan, MD 02/24/23 [phone]

## 2023-02-24 NOTE — ED Notes (Signed)
Pt dressed out in psychiatric appropriate clothes and hospital socks. Belongings inventoried and placed in locker 4 in purple zone.

## 2023-02-24 NOTE — ED Notes (Signed)
Pt reports she is hearing voices again.

## 2023-02-24 NOTE — ED Provider Notes (Signed)
[place] EMERGENCY DEPARTMENT AT [facility] Provider Note   CSN: [id number] Arrival date & time: 02/23/23  2331     History  Chief Complaint  Patient presents with   Ingestion    Holly [name] is a 27 y.o. female.  The history is provided by the patient and medical records.  Ingestion   27 y.o. F with hx of ADHD, agitation, depression, intellectual disability, schizoaffective disorder, PTSD, presenting to the ED for auditory hallucinations.  Was taken to [facility] earlier today from group home due to drinking hand sanitizer.  States she bought this from the dollar store last week.  States she is trying to make the voices stop.  Also placed erasers in her ears and nose for same.  She admits hallucinations began about 2 weeks ago-- telling her that she should hurt/kill herself, no one loves her, etc.  She did break a pen and scratched her left arm and right side of her face.  Openly admits she is a ward of the state and has been since childhood.  States she is not currently taking any medications.   Home Medications Prior to Admission medications   Medication Sig Start Date End Date Taking? Authorizing Provider  acetaminophen (TYLENOL) 325 MG tablet Take 2 tablets (650 mg total) by mouth every 4 (four) hours as needed for fever or mild pain. 09/27/22   Alwyn Ren, MD  amantadine (SYMMETREL) 100 MG capsule Take 1 capsule (100 mg total) by mouth 2 (two) times daily for 7 days. Takes 1 at 8AM and 1 at [facility] 05/09/22 09/23/22  Princess Bruins, DO  APIXABAN Everlene Balls) VTE STARTER PACK (10MG  AND 5MG ) Take as directed on package: start with two-5mg  tablets twice daily for 7 days. On day 8, switch to one-5mg  tablet twice daily. 09/27/22   Alwyn Ren, MD  ARIPiprazole (ABILIFY) 10 MG tablet Take 10 mg by mouth daily.    [provider]  FLUoxetine (PROZAC) 20 MG capsule Take 1 capsule (20 mg total) by mouth daily for 30 doses. For depression 09/27/22 10/27/22   Alwyn Ren, MD  levothyroxine (SYNTHROID) 100 MCG tablet Take 1 tablet (100 mcg total) by mouth daily before breakfast for 7 days. For low thyroid function Patient taking differently: Take 100 mcg by mouth daily before breakfast. 05/09/22 09/23/22  Princess Bruins, DO  linaclotide (LINZESS) 290 MCG CAPS capsule Take 290 mcg by mouth daily before breakfast.    [provider]  lithium carbonate (LITHOBID) 300 MG ER tablet Take 3 tablets (900 mg total) by mouth at bedtime for 7 days. For mood stabilization Patient taking differently: Take 600 mg by mouth at bedtime. 05/09/22 09/23/22  Princess Bruins, DO  metFORMIN (GLUCOPHAGE) 1000 MG tablet Take 1,000 mg by mouth at bedtime.    [provider]  omeprazole (PRILOSEC) 20 MG capsule Take 20 mg by mouth daily.    [provider]  polyethylene glycol (MIRALAX / GLYCOLAX) 17 g packet Take 17 g by mouth daily as needed for moderate constipation. 09/27/22   Alwyn Ren, MD  traZODone (DESYREL) 150 MG tablet Take 300 mg by mouth at bedtime.    [provider]      Allergies    Patient has no known allergies.    Review of Systems   Review of Systems  Psychiatric/Behavioral:  Positive for hallucinations.   All other systems reviewed and are negative.   Physical Exam Updated Vital Signs BP 110/80   Pulse Marland Kitchen)  101   Temp 99 F (37.2 C)   Resp 16   SpO2 100%   Physical Exam Vitals and nursing note reviewed.  Constitutional:      Appearance: She is well-developed.  HENT:     Head: Normocephalic and atraumatic.     Comments: Superficial abrasion to right forehead    Ears:     Comments: Blue eraser in right ear canal Left ear clear    Nose:     Comments: No FB noted in nostrils (apparently blew erasers out in triage) Eyes:     Conjunctiva/sclera: Conjunctivae normal.     Pupils: Pupils are equal, round, and reactive to light.  Cardiovascular:     Rate and Rhythm: Normal rate and regular rhythm.      Heart sounds: Normal heart sounds.  Pulmonary:     Effort: Pulmonary effort is normal.     Breath sounds: Normal breath sounds.  Abdominal:     General: Bowel sounds are normal.     Palpations: Abdomen is soft.  Musculoskeletal:        General: Normal range of motion.     Cervical back: Normal range of motion.     Comments: Superficial abrasions to left volar forearm, no signs of infection/bleeding  Skin:    General: Skin is warm and dry.  Neurological:     Mental Status: She is alert and oriented to person, place, and time.     ED Results / Procedures / Treatments   Labs (all labs ordered are listed, but only abnormal results are displayed) Labs Reviewed  COMPREHENSIVE METABOLIC PANEL - Abnormal; Notable for the following components:      Result Value   Potassium 3.4 (*)    CO2 21 (*)    Glucose, Bld 120 (*)    BUN <5 (*)    All other components within normal limits  CBC - Abnormal; Notable for the following components:   Hemoglobin 11.7 (*)    All other components within normal limits  ETHANOL  RAPID URINE DRUG SCREEN, HOSP PERFORMED  HCG, SERUM, QUALITATIVE  OSMOLALITY    EKG None  Radiology No results found.  Procedures .Foreign Body Removal  Date/Time: 02/24/2023 1:06 AM  Performed by: Garlon Hatchet, PA-C Authorized by: Garlon Hatchet, PA-C  Consent: Verbal consent obtained. Risks and benefits: risks, benefits and alternatives were discussed Consent given by: patient Required items: required blood products, implants, devices, and special equipment available Patient identity confirmed: verbally with patient and arm band Time out: Immediately prior to procedure a "time out" was called to verify the correct patient, procedure, equipment, support staff and site/side marked as required. Body area: ear Location details: right ear  Sedation: Patient sedated: no  Patient restrained: no Cooperative: somehwat. Localization method: visualized and  magnification Removal mechanism: curette and alligator forceps Complexity: simple Post-procedure assessment: foreign body not removed Comments: Patient did not tolerate procedure well despite multiple removal modalities.  She requested to terminate procedure and would not allow further attempts.      Medications Ordered in ED Medications - No data to display  ED Course/ Medical Decision Making/ A&P                                 Medical Decision Making Amount and/or Complexity of Data Reviewed Labs: ordered.   27 year old female presenting to the ED with hallucinations.  Was at [facility] and sent  here for medical clearance that she reportedly drank hand sanitizer.  Also placed erasers in her nose and ears to try to deal out the voices.  States they are commanding her to harm herself.  She did scratch herself on the face and left arm with broken pen.  These wounds are superficial and do not require any formal repair.  No signs of infection.  She removed eraser pieces from her nostrils in triage, had to residual piece of eraser in her right ear, left ear is clear.  Will attempt removal.  Labs sent.  12:24 AM Attempted to remove eraser from right ear canal-- attempted removal with multiple modalities including ear curette, alligator forceps, etc but patient not very cooperative and requested to terminate procedure.  She refused further attempts.  She is not having any pain from FB currently, hearing still intact.  Labs as above--mild anemia but stable.  No significant electrolyte derangement.  Ethanol is negative.  UDS also negative.  Osmolality is normal, no acidosis on chemistry, normal gap. Does not appear to be having any toxic effects from hand sanitizer.  Medically cleared.  Awaiting TTS evaluation.  Final Clinical Impression(s) / ED Diagnoses Final diagnoses:  Auditory hallucination    Rx / DC Orders ED Discharge Orders     None         Garlon Hatchet, PA-C 02/24/23  0541    Maia Plan, MD 03/01/23 1009

## 2023-02-24 NOTE — ED Notes (Signed)
No change in pt. Continue to wait for TTS evaluation. Sitter at bedside.

## 2023-02-24 NOTE — Consult Note (Signed)
[facility] ED ASSESSMENT   Reason for Consult:  Psych consult Referring Physician:  Ileene Hutchinson Patient Identification: Holly [name] MRN:  [id number] ED Chief Complaint: Suicidal ideation  Diagnosis:  Principal Problem:   Suicidal ideation   ED Assessment Time Calculation: Start Time: 1500 Stop Time: 1555 Total Time in Minutes (Assessment Completion): 55  HPI:  Holly [name] is a 27 y.o. female patient presents with suicidal ideation from the [facility] after reporting she drank hand sanitizer.  She has a history of ADHD, depression, intellectual disability, schizoaffective disorder and PTSD.   Subjective:   Holly [name] is a 27 y.o. female patient presents with suicidal ideation from the [facility] after reporting she drank hand sanitizer.  She has a history of ADHD, depression, intellectual disability, schizoaffective disorder and PTSD.     Charmayne Sheer, 27 y.o., female patient seen face to face by this provider, consulted with Dr. Lucianne Muss; and chart reviewed on 02/24/23.  On evaluation Sheika N Rummell reports she is feeling so much better after getting sleep; she has slept most of the day.  She denies current suicidal ideation.  She denies psychotic symptoms. Patient appears bright, cheerful and asks about going home.    During evaluation Fallon N Brabec is sitting in bed in no acute distress.  She is alert, oriented x 4, calm, cooperative and attentive.  Her mood is euthymic with congruent affect.  She has normal speech, and behavior.  Objectively there is no evidence of psychosis/mania or delusional thinking.  Patient is able to converse coherently, goal directed thoughts, no distractibility, or pre-occupation. She also denies suicidal/self-harm/homicidal ideation, psychosis, and paranoia.  Patient answered questions appropriately.    Patient denies current suicidal ideation and is not a danger to herself or anyone else.  She is not acutely psychotic.  She does not meet criteria for  inpatient psychiatric hospitalization at this time.    Past Psychiatric History: Mood disorder, bipolar disorder, ADHD, schizophrenia and IDD  Risk to Self or Others: Is the patient at risk to self? No Has the patient been a risk to self in the past 6 months? Yes Has the patient been a risk to self within the distant past? Yes Is the patient a risk to others? No Has the patient been a risk to others in the past 6 months? No Has the patient been a risk to others within the distant past? No  [place] Scale:  Flowsheet Row ED from 02/23/2023 in [facility] Emergency Department at [facility] Most recent reading at 02/23/2023 11:37 PM ED from 02/23/2023 in [facility] Most recent reading at 02/23/2023  9:02 PM ED from 01/24/2023 in [facility] at [facility]) Most recent reading at 01/24/2023 11:04 AM  C-SSRS RISK CATEGORY High Risk High Risk No Risk      Substance Abuse:   None noted  Past Medical History:  Past Medical History:  Diagnosis Date   ADHD (attention deficit hyperactivity disorder)    Bipolar 1 disorder (HCC)    Depression    Diabetes mellitus    Gluten free diet    Hypothyroidism    Mood disorder (HCC)    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   Obesity    PTSD (post-traumatic stress disorder)     Past Surgical History:  Procedure Laterality Date   IR ANGIOGRAM PULMONARY BILATERAL SELECTIVE  09/23/2022   IR THROMBECT PRIM MECH INIT (INCLU) MOD SED  09/23/2022  IR US GUIDE VASC ACCESS RIGHT  09/23/2022   NO PAST SURGERIES     None     Family History:  Family History  Adopted: Yes   Family Psychiatric  History: None noted Social History:  Social History   Substance and Sexual Activity  Alcohol Use No     Social History   Substance and Sexual Activity  Drug Use No    Social History   Socioeconomic History   Marital status: Single    Spouse name: Not on file   Number of children: Not on  file   Years of education: Not on file   Highest education level: Not on file  Occupational History   Not on file  Tobacco Use   Smoking status: Never   Smokeless tobacco: Never  Vaping Use   Vaping status: Never Used  Substance and Sexual Activity   Alcohol use: No   Drug use: No   Sexual activity: Never    Birth control/protection: Injection  Other Topics Concern   Not on file  Social History Narrative   ** Merged History Encounter **       Social Determinants of Health   Financial Resource Strain: Not on file  Food Insecurity: Not on file  Transportation Needs: Not on file  Physical Activity: Not on file  Stress: Not on file  Social Connections: Not on file   Additional Social History: Lives in a group home    Allergies:  No Known Allergies  Labs:  Results for orders placed or performed during the hospital encounter of 02/23/23 (from the past 48 hour(s))  Comprehensive metabolic panel     Status: Abnormal   Collection Time: 02/23/23 11:56 PM  Result Value Ref Range   Sodium 137 135 - 145 mmol/L   Potassium 3.4 (L) 3.5 - 5.1 mmol/L   Chloride 106 98 - 111 mmol/L   CO2 21 (L) 22 - 32 mmol/L   Glucose, Bld 120 (H) 70 - 99 mg/dL    Comment: Glucose reference range applies only to samples taken after fasting for at least 8 hours.   BUN <5 (L) 6 - 20 mg/dL    Comment: REPEATED TO VERIFY   Creatinine, Ser 0.80 0.44 - 1.00 mg/dL   Calcium 9.1 8.9 - 16.1 mg/dL   Total Protein 7.3 6.5 - 8.1 g/dL   Albumin 3.7 3.5 - 5.0 g/dL   AST 17 15 - 41 U/L   ALT 6 0 - 44 U/L   Alkaline Phosphatase 93 38 - 126 U/L   Total Bilirubin 0.6 0.3 - 1.2 mg/dL   GFR, Estimated >09 >60 mL/min    Comment: (NOTE) Calculated using the CKD-EPI Creatinine Equation (2021)    Anion gap 10 5 - 15    Comment: Performed at [facility], [address], [place], [place] [address]  Ethanol     Status: None   Collection Time: 02/23/23 11:56 PM  Result Value Ref Range   Alcohol, Ethyl (B)  <10 <10 mg/dL    Comment: (NOTE) Lowest detectable limit for serum alcohol is 10 mg/dL.  For medical purposes only. Performed at [facility], [address] [place], [place] [address]   cbc     Status: Abnormal   Collection Time: 02/23/23 11:56 PM  Result Value Ref Range   WBC 8.1 4.0 - 10.5 K/uL   RBC 4.02 3.87 - 5.11 MIL/uL   Hemoglobin 11.7 (L) 12.0 - 15.0 g/dL  HCT 36.6 36.0 - 46.0 %   MCV 91.0 80.0 - 100.0 fL   MCH 29.1 26.0 - 34.0 pg   MCHC 32.0 30.0 - 36.0 g/dL   RDW 56.4 33.2 - 95.1 %   Platelets 378 150 - 400 K/uL   nRBC 0.0 0.0 - 0.2 %    Comment: Performed at [facility], [address] [place], [place] [address]  Rapid urine drug screen (hospital performed)     Status: None   Collection Time: 02/23/23 11:56 PM  Result Value Ref Range   Opiates NONE DETECTED NONE DETECTED   Cocaine NONE DETECTED NONE DETECTED   Benzodiazepines NONE DETECTED NONE DETECTED   Amphetamines NONE DETECTED NONE DETECTED   Tetrahydrocannabinol NONE DETECTED NONE DETECTED   Barbiturates NONE DETECTED NONE DETECTED    Comment: (NOTE) DRUG SCREEN FOR MEDICAL PURPOSES ONLY.  IF CONFIRMATION IS NEEDED FOR ANY PURPOSE, NOTIFY LAB WITHIN 5 DAYS.  LOWEST DETECTABLE LIMITS FOR URINE DRUG SCREEN Drug Class                     Cutoff (ng/mL) Amphetamine and metabolites    1000 Barbiturate and metabolites    200 Benzodiazepine                 200 Opiates and metabolites        300 Cocaine and metabolites        300 THC                            50 Performed at [facility], [address] [place], [place] [address]   hCG, serum, qualitative     Status: None   Collection Time: 02/23/23 11:56 PM  Result Value Ref Range   Preg, Serum NEGATIVE NEGATIVE    Comment:        THE SENSITIVITY OF THIS METHODOLOGY IS >10 mIU/mL. Performed at [facility], [address], [place], [place] [address]   Osmolality     Status: None   Collection Time: 02/23/23 11:56 PM   Result Value Ref Range   Osmolality 286 275 - 295 mOsm/kg    Comment: REPEATED TO VERIFY Performed at [facility], [address] [place], [place] [address]     No current facility-administered medications for this encounter.   Current Outpatient Medications  Medication Sig Dispense Refill   acetaminophen (TYLENOL) 325 MG tablet Take 2 tablets (650 mg total) by mouth every 4 (four) hours as needed for fever or mild pain.     amantadine (SYMMETREL) 100 MG capsule Take 1 capsule (100 mg total) by mouth 2 (two) times daily for 7 days. Takes 1 at 8AM and 1 at 5PM (Patient taking differently: Take 100 mg by mouth See admin instructions. Give 100 mg (1 capsule) twice daily at 0800 and 1700.) 14 capsule 0   apixaban (ELIQUIS) 5 MG TABS tablet Take 5 mg by mouth 2 (two) times daily.     citalopram (CELEXA) 20 MG tablet Take 20 mg by mouth daily.     levothyroxine (SYNTHROID) 100 MCG tablet Take 1 tablet (100 mcg total) by mouth daily before breakfast for 7 days. For low thyroid function (Patient taking differently: Take 100 mcg by mouth daily.) 7 tablet 0   linaclotide (LINZESS) 290 MCG CAPS capsule Take 290 mcg by mouth daily.     lithium carbonate (LITHOBID) 300 MG ER  tablet Take 3 tablets (900 mg total) by mouth at bedtime for 7 days. For mood stabilization (Patient taking differently: Take 300 mg by mouth at bedtime.) 21 tablet 0   metFORMIN (GLUCOPHAGE) 1000 MG tablet Take 1,000 mg by mouth at bedtime.     metFORMIN (GLUCOPHAGE) 500 MG tablet Take 500 mg by mouth daily.     OLANZapine-Samidorphan (LYBALVI) 10-10 MG TABS Take 1 tablet by mouth at bedtime.     omeprazole (PRILOSEC) 20 MG capsule Take 20 mg by mouth daily.     traZODone (DESYREL) 150 MG tablet Take 150 mg by mouth at bedtime.      Musculoskeletal: Strength & Muscle Tone: within normal limits Gait & Station: normal Patient leans: N/A   Psychiatric Specialty Exam: Presentation  General Appearance:  Appropriate for  Environment  Eye Contact: Good  Speech: Clear and Coherent  Speech Volume: Normal  Handedness: Right   Mood and Affect  Mood: Euthymic  Affect: Congruent   Thought Process  Thought Processes: Coherent  Descriptions of Associations:Intact  Orientation:Full (Time, Place and Person)  Thought Content:WDL  History of Schizophrenia/Schizoaffective disorder:Yes  Duration of Psychotic Symptoms:Greater than six months  Hallucinations:Hallucinations: None Description of Auditory Hallucinations: voices telling her to kill herself  Ideas of Reference:None  Suicidal Thoughts:Suicidal Thoughts: No SI Active Intent and/or Plan: With Intent; With Plan SI Passive Intent and/or Plan: With Intent; With Plan  Homicidal Thoughts:Homicidal Thoughts: No   Sensorium  Memory: Immediate Fair; Recent Fair; Remote Fair  Judgment: Poor  Insight: Poor   Executive Functions  Concentration: Fair  Attention Span: Fair  Recall: [employer] of Knowledge: Fair  Language: Fair   Psychomotor Activity  Psychomotor Activity: Psychomotor Activity: Normal   Assets  Assets: Desire for Improvement; Social Support; Leisure Time    Sleep  Sleep: Sleep: Good Number of Hours of Sleep: 8   Physical Exam: Physical Exam Vitals and nursing note reviewed.  Eyes:     Pupils: Pupils are equal, round, and reactive to light.  Pulmonary:     Effort: Pulmonary effort is normal.  Skin:    General: Skin is dry.  Neurological:     Mental Status: She is alert and oriented to person, place, and time.    Review of Systems  All other systems reviewed and are negative.  Blood pressure 121/79, pulse (!) 106, temperature 98.5 F (36.9 C), temperature source Oral, resp. rate 17, SpO2 99%. There is no height or weight on file to calculate BMI.  Medical Decision Making: Patient case reviewed and discussed with Dr. Lucianne Muss.  Patient denies current suicidal ideation and is not a  danger to herself or anyone else.  She is not acutely psychotic.  She does not meet criteria for inpatient psychiatric hospitalization at this time.  Patient is psychiatrically cleared.  Problem 1: Suicidal ideation -Resolved -Continue taking home psychiatric medications: Citalopram 20mg  PO Q day Lybalvi 10-10mg  PO Q HS Trazodone 150mg  PO Q HS Lithium 300mg  PO BID  Disposition:  Patient is psychiatrically cleared.  Thomes Lolling, NP 02/24/2023 3:56 PM

## 2023-02-24 NOTE — ED Notes (Signed)
Pt reported to this RN she is still hearing voices telling her to kill herself. She has just settled back into bed after showering & showing no distress. Sitter in eyesight of pt, bed in low position & she is watching tv at this time.

## 2023-02-24 NOTE — ED Notes (Signed)
Patient and legal guardian verbalizes understanding of discharge instructions. Opportunity for questioning and answers were provided. Armband removed by staff, pt discharged from ED. Pt ambulatory to ED waiting room with steady gait present with legal guardian Lynne Logan.

## 2023-02-24 NOTE — Discharge Instructions (Signed)
Please take lithium 300mg   by mouth two times a day.

## 2023-02-24 NOTE — ED Notes (Signed)
Assumed care of patient, NAD noted at this time, pt resting in bed, respirations even and unlabored, skin warm and dry, bed in low position, waiting for TTS evaluation. Comfort measures offered. Sandwich snack and cola given to patient, Will continue to monitor.

## 2023-02-24 NOTE — ED Notes (Signed)
Pt wanded by security at this time  ?

## 2023-02-24 NOTE — ED Notes (Signed)
Legal guardian Lynne Logan made aware of pt being ready for discharge.

## 2023-02-24 NOTE — ED Notes (Signed)
No change in pt condition, pt waiting for TTS, will continue to monitor.

## 2023-02-24 NOTE — ED Notes (Signed)
Holly [name] at bedside, discharge instructions reviewed with pt and legal guardian.

## 2023-02-24 NOTE — ED Notes (Signed)
Pt endorses suicidal thoughts to this RN. States that she has thought of either jumping off of a bridge, cutting her wrist or running out in traffic. Misty Stanley, [place] informed. Sitter ordered.

## 2023-02-27 NOTE — Plan of Care (Signed)
[facility] Tonsillectomy/Adenoidectomy, Postoperative PEDS care plan entered in error.

## 2023-03-23 ENCOUNTER — Emergency Department (HOSPITAL_COMMUNITY)
Admission: EM | Admit: 2023-03-23 | Discharge: 2023-03-24 | Disposition: A | Discharge status: Home / Self Care | Payer: MEDICAID | Attending: Emergency Medicine | Admitting: Emergency Medicine

## 2023-03-23 ENCOUNTER — Encounter (HOSPITAL_COMMUNITY): Payer: Self-pay

## 2023-03-23 ENCOUNTER — Other Ambulatory Visit: Payer: Self-pay

## 2023-03-23 DIAGNOSIS — F250 Schizoaffective disorder, bipolar type: Secondary | ICD-10-CM | POA: Diagnosis present

## 2023-03-23 DIAGNOSIS — F70 Mild intellectual disabilities: Secondary | ICD-10-CM | POA: Diagnosis present

## 2023-03-23 DIAGNOSIS — R45851 Suicidal ideations: Secondary | ICD-10-CM | POA: Insufficient documentation

## 2023-03-23 DIAGNOSIS — F4310 Post-traumatic stress disorder, unspecified: Secondary | ICD-10-CM | POA: Diagnosis not present

## 2023-03-23 DIAGNOSIS — Z7901 Long term (current) use of anticoagulants: Secondary | ICD-10-CM | POA: Diagnosis not present

## 2023-03-23 DIAGNOSIS — F329 Major depressive disorder, single episode, unspecified: Secondary | ICD-10-CM | POA: Insufficient documentation

## 2023-03-23 DIAGNOSIS — F332 Major depressive disorder, recurrent severe without psychotic features: Secondary | ICD-10-CM | POA: Diagnosis not present

## 2023-03-23 LAB — CBC WITH DIFFERENTIAL/PLATELET
Abs Immature Granulocytes: 0.03 10*3/uL (ref 0.00–0.07)
Basophils Absolute: 0.1 10*3/uL (ref 0.0–0.1)
Basophils Relative: 1 %
Eosinophils Absolute: 0.1 10*3/uL (ref 0.0–0.5)
Eosinophils Relative: 1 %
HCT: 36.5 % (ref 36.0–46.0)
Hemoglobin: 11.5 g/dL — ABNORMAL LOW (ref 12.0–15.0)
Immature Granulocytes: 0 %
Lymphocytes Relative: 21 %
Lymphs Abs: 2.2 10*3/uL (ref 0.7–4.0)
MCH: 28.8 pg (ref 26.0–34.0)
MCHC: 31.5 g/dL (ref 30.0–36.0)
MCV: 91.5 fL (ref 80.0–100.0)
Monocytes Absolute: 0.6 10*3/uL (ref 0.1–1.0)
Monocytes Relative: 6 %
Neutro Abs: 7.2 10*3/uL (ref 1.7–7.7)
Neutrophils Relative %: 71 %
Platelets: 321 10*3/uL (ref 150–400)
RBC: 3.99 MIL/uL (ref 3.87–5.11)
RDW: 15.5 % (ref 11.5–15.5)
WBC: 10.2 10*3/uL (ref 4.0–10.5)
nRBC: 0 % (ref 0.0–0.2)

## 2023-03-23 LAB — COMPREHENSIVE METABOLIC PANEL
ALT: 9 U/L (ref 0–44)
AST: 15 U/L (ref 15–41)
Albumin: 4 g/dL (ref 3.5–5.0)
Alkaline Phosphatase: 85 U/L (ref 38–126)
Anion gap: 8 (ref 5–15)
BUN: 7 mg/dL (ref 6–20)
CO2: 23 mmol/L (ref 22–32)
Calcium: 9.3 mg/dL (ref 8.9–10.3)
Chloride: 104 mmol/L (ref 98–111)
Creatinine, Ser: 0.84 mg/dL (ref 0.44–1.00)
GFR, Estimated: >60 mL/min (ref >60)
Glucose, Bld: 103 mg/dL — ABNORMAL HIGH (ref 70–99)
Potassium: 3.2 mmol/L — ABNORMAL LOW (ref 3.5–5.1)
Sodium: 135 mmol/L (ref 135–145)
Total Bilirubin: 0.4 mg/dL (ref <1.2)
Total Protein: 7.8 g/dL (ref 6.5–8.1)

## 2023-03-23 LAB — RAPID URINE DRUG SCREEN, HOSP PERFORMED
Amphetamines: NOT DETECTED
Barbiturates: NOT DETECTED
Benzodiazepines: NOT DETECTED
Cocaine: NOT DETECTED
Opiates: NOT DETECTED
Tetrahydrocannabinol: NOT DETECTED

## 2023-03-23 LAB — ACETAMINOPHEN LEVEL: Acetaminophen (Tylenol), Serum: <10 ug/mL — ABNORMAL LOW (ref 10–30)

## 2023-03-23 LAB — HCG, SERUM, QUALITATIVE: Preg, Serum: NEGATIVE

## 2023-03-23 LAB — ETHANOL: Alcohol, Ethyl (B): <10 mg/dL (ref <10)

## 2023-03-23 LAB — SALICYLATE LEVEL: Salicylate Lvl: <7 mg/dL — ABNORMAL LOW (ref 7.0–30.0)

## 2023-03-23 MED ORDER — METFORMIN HCL 500 MG PO TABS
1000.0000 mg | ORAL_TABLET | Freq: Every day | ORAL | Status: DC
  Administered 2023-03-24: 1000 mg via ORAL
  Filled 2023-03-23: qty 2

## 2023-03-23 MED ORDER — METFORMIN HCL 500 MG PO TABS
500.0000 mg | ORAL_TABLET | Freq: Every day | ORAL | Status: DC
  Administered 2023-03-24: 500 mg via ORAL
  Filled 2023-03-23: qty 1

## 2023-03-23 MED ORDER — OLANZAPINE-SAMIDORPHAN 10-10 MG PO TABS: 1.0000 | ORAL_TABLET | Freq: Every day | ORAL | Status: DC

## 2023-03-23 MED ORDER — TRAZODONE HCL 100 MG PO TABS
150.0000 mg | ORAL_TABLET | Freq: Every day | ORAL | Status: DC
  Administered 2023-03-24: 150 mg via ORAL
  Filled 2023-03-23: qty 2

## 2023-03-23 MED ORDER — LEVOTHYROXINE SODIUM 100 MCG PO TABS: 100.0000 ug | ORAL_TABLET | Freq: Every day | ORAL | Status: DC

## 2023-03-23 MED ORDER — ACETAMINOPHEN 325 MG PO TABS: 650.0000 mg | ORAL_TABLET | ORAL | Status: DC | PRN

## 2023-03-23 MED ORDER — LINACLOTIDE 145 MCG PO CAPS: 290.0000 ug | ORAL_CAPSULE | Freq: Every day | ORAL | Status: DC

## 2023-03-23 MED ORDER — APIXABAN 5 MG PO TABS
5.0000 mg | ORAL_TABLET | Freq: Two times a day (BID) | ORAL | Status: DC
  Administered 2023-03-24 (×2): 5 mg via ORAL
  Filled 2023-03-23 (×2): qty 1

## 2023-03-23 MED ORDER — LEVOTHYROXINE SODIUM 100 MCG PO TABS
100.0000 ug | ORAL_TABLET | Freq: Every day | ORAL | Status: DC
Start: 2023-03-24 — End: 2023-03-24
  Administered 2023-03-24: 100 ug via ORAL
  Filled 2023-03-23: qty 1

## 2023-03-23 MED ORDER — CITALOPRAM HYDROBROMIDE 10 MG PO TABS
20.0000 mg | ORAL_TABLET | Freq: Every day | ORAL | Status: DC
  Administered 2023-03-24: 20 mg via ORAL
  Filled 2023-03-23: qty 2

## 2023-03-23 MED ORDER — MENTHOL 3 MG MT LOZG
1.0000 | LOZENGE | OROMUCOSAL | Status: DC | PRN
  Administered 2023-03-24: 3 mg via ORAL
  Filled 2023-03-23: qty 9

## 2023-03-23 MED ORDER — LITHIUM CARBONATE ER 450 MG PO TBCR
900.0000 mg | EXTENDED_RELEASE_TABLET | Freq: Every day | ORAL | Status: DC
  Administered 2023-03-24: 900 mg via ORAL
  Filled 2023-03-23: qty 2

## 2023-03-23 MED ORDER — AMANTADINE HCL 100 MG PO CAPS
100.0000 mg | ORAL_CAPSULE | Freq: Two times a day (BID) | ORAL | Status: DC
  Administered 2023-03-24 (×2): 100 mg via ORAL
  Filled 2023-03-23 (×2): qty 1

## 2023-03-23 NOTE — ED Notes (Signed)
Pt dressed  out and into scrubs, pt belongings bag in cabinet 16-18. One see through trash  bag and a belongings bag.

## 2023-03-23 NOTE — ED Provider Notes (Signed)
[place] EMERGENCY DEPARTMENT AT [facility] Provider Note   CSN: [id number] Arrival date & time: 03/23/23  1832     History  Chief Complaint  Patient presents with   Suicidal    Holly [name] is a 26 y.o. female with a past medical history of ADHD, depression, intellectual disability, schizoaffective disorder, PTSD presents to emergency department via EMS for evaluation of self-harm with glass and SI.  She reports that she has had difficulties with her group home not letting her contact her family as she has in the past.  She reports that she ran away and found a empty glass alcohol bottle and cut her left arm in a suicide attempt.  Currently she continues to endorse depression and SI.  She denies head injury, loc, HI.  HPI     Home Medications Prior to Admission medications   Medication Sig Start Date End Date Taking? Authorizing Provider  acetaminophen (TYLENOL) 325 MG tablet Take 2 tablets (650 mg total) by mouth every 4 (four) hours as needed for fever or mild pain. 09/27/22   Alwyn Ren, MD  amantadine (SYMMETREL) 100 MG capsule Take 1 capsule (100 mg total) by mouth 2 (two) times daily for 7 days. Takes 1 at 8AM and 1 at 5PM Patient taking differently: Take 100 mg by mouth See admin instructions. Give 100 mg (1 capsule) twice daily at 0800 and 1700. 05/09/22 04/15/23  Princess Bruins, DO  apixaban (ELIQUIS) 5 MG TABS tablet Take 5 mg by mouth 2 (two) times daily.    [provider]  citalopram (CELEXA) 20 MG tablet Take 20 mg by mouth daily.    [provider]  levothyroxine (SYNTHROID) 100 MCG tablet Take 1 tablet (100 mcg total) by mouth daily before breakfast for 7 days. For low thyroid function Patient taking differently: Take 100 mcg by mouth daily. 05/09/22 04/15/23  Princess Bruins, DO  linaclotide [facility]) 290 MCG CAPS capsule Take 290 mcg by mouth daily.    [provider]  lithium carbonate (LITHOBID) 300 MG ER tablet  Take 3 tablets (900 mg total) by mouth at bedtime for 7 days. For mood stabilization Patient taking differently: Take 300 mg by mouth at bedtime. 05/09/22 04/15/23  Princess Bruins, DO  metFORMIN (GLUCOPHAGE) 1000 MG tablet Take 1,000 mg by mouth at bedtime.    [provider]  metFORMIN (GLUCOPHAGE) 500 MG tablet Take 500 mg by mouth daily.    [provider]  OLANZapine-Samidorphan (LYBALVI) 10-10 MG TABS Take 1 tablet by mouth at bedtime.    [provider]  omeprazole (PRILOSEC) 20 MG capsule Take 20 mg by mouth daily.    [provider]  traZODone (DESYREL) 150 MG tablet Take 150 mg by mouth at bedtime.    [provider]      Allergies    Patient has no known allergies.    Review of Systems   Review of Systems  Constitutional:  Negative for chills, fatigue and fever.  Respiratory:  Negative for cough, chest tightness, shortness of breath and wheezing.   Cardiovascular:  Negative for chest pain and palpitations.  Gastrointestinal:  Negative for abdominal pain, constipation, diarrhea, nausea and vomiting.  Neurological:  Negative for dizziness, seizures, weakness, light-headedness, numbness and headaches.  Psychiatric/Behavioral:  Positive for self-injury and suicidal ideas. Negative for hallucinations.     Physical Exam Updated Vital Signs BP 120/82   Pulse 77   Temp 98.4 F (36.9 C) (Oral)   Resp  18   Ht 5\' 5"  (1.651 m)   Wt 107.4 kg   SpO2 100%   BMI 39.40 kg/m  Physical Exam Vitals and nursing note reviewed.  Constitutional:      General: She is not in acute distress.    Appearance: Normal appearance.  HENT:     Head: Normocephalic and atraumatic.  Eyes:     Conjunctiva/sclera: Conjunctivae normal.  Cardiovascular:     Rate and Rhythm: Normal rate.  Pulmonary:     Effort: Pulmonary effort is normal. No respiratory distress.  Skin:    Coloration: Skin is not jaundiced or pale.     Comments: Multiple lacerations without  hemorrhage to anterior left lower arm from self-inflicted cutting  Neurological:     Mental Status: She is alert and oriented to person, place, and time. Mental status is at baseline.     Cranial Nerves: No cranial nerve deficit.     Sensory: No sensory deficit.     Motor: No weakness.     Coordination: Coordination normal.     Gait: Gait normal.     Deep Tendon Reflexes: Reflexes normal.  Psychiatric:     Comments: +SI     ED Results / Procedures / Treatments   Labs (all labs ordered are listed, but only abnormal results are displayed) Labs Reviewed  CBC WITH DIFFERENTIAL/PLATELET - Abnormal; Notable for the following components:      Result Value   Hemoglobin 11.5 (*)    All other components within normal limits  COMPREHENSIVE METABOLIC PANEL  ETHANOL  RAPID URINE DRUG SCREEN, HOSP PERFORMED  HCG, SERUM, QUALITATIVE  SALICYLATE LEVEL  ACETAMINOPHEN LEVEL    EKG EKG Interpretation Date/Time:  Thursday March 23 2023 20:03:24 EST Ventricular Rate:  74 PR Interval:  152 QRS Duration:  82 QT Interval:  384 QTC Calculation: 426 R Axis:   34  Text Interpretation: Normal sinus rhythm Cannot rule out Anterior infarct , age undetermined Abnormal ECG When compared with ECG of 22-Sep-2022 17:08, PREVIOUS ECG IS PRESENT Confirmed by Alona Bene [phone]) on 03/23/2023 8:09:33 PM  Radiology No results found.  Procedures Procedures    Medications Ordered in ED Medications - No data to display  ED Course/ Medical Decision Making/ A&P                                 Medical Decision Making Amount and/or Complexity of Data Reviewed Labs: ordered.   Patient presents to the ED for concern of SI and self-inflicted lacerations of left anterior arm, this involves an extensive number of treatment options, and is a complaint that carries with it a high risk of complications and morbidity.    Co morbidities that complicate the patient evaluation  Psychiatric  disorder   Additional history obtained:  Additional history obtained from EMS, Nursing, Outside Medical Records, and Past Admission   External records from outside source obtained and reviewed   Lab Tests:  I Ordered, and personally interpreted labs.  The pertinent results include:  no significant electrolyte abnormality requiring ED intervention    Cardiac Monitoring:  The patient was maintained on a cardiac monitor.  I personally viewed and interpreted the cardiac monitored which showed an underlying rhythm of: NSR and similar to prior EKG   Consultations Obtained:  I requested consultation with TTS and pending at sign out   Problem List / ED Course:  SI Self injury   Reevaluation:  After the interventions noted above, I reevaluated the patient and found that they have :stayed the same   Social Determinants of Health:  She reports that she is not satisfied with living situation at home.  She reports frequent disagreements with her other group home members.  She reports that she does not feel safe at home   Dispostion:  Patient is resting comfortably in bed.  She is not in signs of physical distress.  Vital signs within normal limits.  See HPI.  Patient has multiple nonactive hemorrhaging lacerations to left anterior lower arm.  There is not appear to be any foreign bodies.  Arm is nontender to touch.  Pulse, motor, and sensation equal and intact. Lacerations are superficial and do not require repair. She endorses continued SI. She denies complaints of pain.  Patient is medically cleared. TTS pending at sign out to [facility].         Final Clinical Impression(s) / ED Diagnoses Final diagnoses:  Suicidal ideation    Rx / DC Orders ED Discharge Orders     None         Judithann Sheen, PA 03/23/23 2248    Maia Plan, MD 03/30/23 1506

## 2023-03-23 NOTE — ED Triage Notes (Signed)
Patient BIB EMS for suicide attempt. Patient attempted to cut her left arm. Patient reports trouble with her group home not letting her contact family. Patient has lac on left arm, bleeding controlled at time of triage. Patient is calm and cooperative at time of triage.

## 2023-03-24 ENCOUNTER — Inpatient Hospital Stay (HOSPITAL_COMMUNITY)
Admission: AD | Admit: 2023-03-24 | Discharge: 2023-03-31 | DRG: 885 | Disposition: A | Discharge status: Home / Self Care | Payer: MEDICAID | Source: Intra-hospital | Attending: Psychiatry | Admitting: Psychiatry

## 2023-03-24 ENCOUNTER — Encounter (HOSPITAL_COMMUNITY): Payer: Self-pay | Admitting: Psychiatry

## 2023-03-24 DIAGNOSIS — Z86711 Personal history of pulmonary embolism: Secondary | ICD-10-CM | POA: Diagnosis not present

## 2023-03-24 DIAGNOSIS — R45851 Suicidal ideations: Secondary | ICD-10-CM | POA: Diagnosis present

## 2023-03-24 DIAGNOSIS — Z79899 Other long term (current) drug therapy: Secondary | ICD-10-CM | POA: Diagnosis not present

## 2023-03-24 DIAGNOSIS — F70 Mild intellectual disabilities: Secondary | ICD-10-CM | POA: Diagnosis present

## 2023-03-24 DIAGNOSIS — F250 Schizoaffective disorder, bipolar type: Secondary | ICD-10-CM | POA: Diagnosis present

## 2023-03-24 DIAGNOSIS — Z23 Encounter for immunization: Secondary | ICD-10-CM | POA: Diagnosis not present

## 2023-03-24 DIAGNOSIS — F332 Major depressive disorder, recurrent severe without psychotic features: Secondary | ICD-10-CM

## 2023-03-24 DIAGNOSIS — Z9151 Personal history of suicidal behavior: Secondary | ICD-10-CM | POA: Diagnosis not present

## 2023-03-24 DIAGNOSIS — F909 Attention-deficit hyperactivity disorder, unspecified type: Secondary | ICD-10-CM | POA: Diagnosis present

## 2023-03-24 DIAGNOSIS — F4310 Post-traumatic stress disorder, unspecified: Secondary | ICD-10-CM | POA: Diagnosis present

## 2023-03-24 DIAGNOSIS — Z6838 Body mass index (BMI) 38.0-38.9, adult: Secondary | ICD-10-CM

## 2023-03-24 DIAGNOSIS — E669 Obesity, unspecified: Secondary | ICD-10-CM | POA: Diagnosis present

## 2023-03-24 DIAGNOSIS — S51812A Laceration without foreign body of left forearm, initial encounter: Secondary | ICD-10-CM | POA: Diagnosis present

## 2023-03-24 DIAGNOSIS — Z79890 Hormone replacement therapy (postmenopausal): Secondary | ICD-10-CM

## 2023-03-24 DIAGNOSIS — E039 Hypothyroidism, unspecified: Secondary | ICD-10-CM | POA: Diagnosis present

## 2023-03-24 DIAGNOSIS — E119 Type 2 diabetes mellitus without complications: Secondary | ICD-10-CM | POA: Diagnosis present

## 2023-03-24 DIAGNOSIS — F419 Anxiety disorder, unspecified: Secondary | ICD-10-CM | POA: Diagnosis present

## 2023-03-24 DIAGNOSIS — Z7984 Long term (current) use of oral hypoglycemic drugs: Secondary | ICD-10-CM | POA: Diagnosis not present

## 2023-03-24 DIAGNOSIS — X780XXA Intentional self-harm by sharp glass, initial encounter: Secondary | ICD-10-CM | POA: Diagnosis present

## 2023-03-24 DIAGNOSIS — F329 Major depressive disorder, single episode, unspecified: Secondary | ICD-10-CM | POA: Diagnosis not present

## 2023-03-24 DIAGNOSIS — Z7901 Long term (current) use of anticoagulants: Secondary | ICD-10-CM

## 2023-03-24 MED ORDER — INFLUENZA VIRUS VACC SPLIT PF (FLUZONE) 0.5 ML IM SUSY
0.5000 mL | PREFILLED_SYRINGE | INTRAMUSCULAR | Status: AC
  Administered 2023-03-28: 0.5 mL via INTRAMUSCULAR
  Filled 2023-03-24: qty 0.5

## 2023-03-24 MED ORDER — LEVOTHYROXINE SODIUM 100 MCG PO TABS
100.0000 ug | ORAL_TABLET | Freq: Every day | ORAL | Status: DC
Start: 2023-03-25 — End: 2023-03-31
  Administered 2023-03-25 – 2023-03-31 (×7): 100 ug via ORAL
  Filled 2023-03-24 (×10): qty 1

## 2023-03-24 MED ORDER — HALOPERIDOL 5 MG PO TABS: 5.0000 mg | ORAL_TABLET | Freq: Three times a day (TID) | ORAL | Status: DC | PRN

## 2023-03-24 MED ORDER — ALUM & MAG HYDROXIDE-SIMETH 200-200-20 MG/5ML PO SUSP
30.0000 mL | ORAL | Status: DC | PRN
  Administered 2023-03-30: 30 mL via ORAL
  Filled 2023-03-24: qty 30

## 2023-03-24 MED ORDER — HYDROXYZINE HCL 25 MG PO TABS
25.0000 mg | ORAL_TABLET | Freq: Three times a day (TID) | ORAL | Status: DC | PRN
  Administered 2023-03-24 – 2023-03-29 (×5): 25 mg via ORAL
  Filled 2023-03-24 (×5): qty 1

## 2023-03-24 MED ORDER — MAGNESIUM HYDROXIDE 400 MG/5ML PO SUSP: 30.0000 mL | Freq: Every day | ORAL | Status: DC | PRN

## 2023-03-24 MED ORDER — APIXABAN 5 MG PO TABS
5.0000 mg | ORAL_TABLET | Freq: Two times a day (BID) | ORAL | Status: DC
  Filled 2023-03-24 (×2): qty 1

## 2023-03-24 MED ORDER — CITALOPRAM HYDROBROMIDE 20 MG PO TABS
20.0000 mg | ORAL_TABLET | Freq: Every day | ORAL | Status: DC
  Administered 2023-03-25 – 2023-03-31 (×7): 20 mg via ORAL
  Filled 2023-03-24 (×10): qty 1

## 2023-03-24 MED ORDER — ACETAMINOPHEN 325 MG PO TABS
650.0000 mg | ORAL_TABLET | Freq: Four times a day (QID) | ORAL | Status: DC | PRN
  Administered 2023-03-28 – 2023-03-30 (×2): 650 mg via ORAL
  Filled 2023-03-24 (×2): qty 2

## 2023-03-24 MED ORDER — TRAZODONE HCL 150 MG PO TABS
150.0000 mg | ORAL_TABLET | Freq: Every day | ORAL | Status: DC
  Administered 2023-03-24 – 2023-03-30 (×7): 150 mg via ORAL
  Filled 2023-03-24 (×9): qty 1

## 2023-03-24 MED ORDER — OLANZAPINE-SAMIDORPHAN 10-10 MG PO TABS
1.0000 | ORAL_TABLET | Freq: Every day | ORAL | Status: DC
  Administered 2023-03-24: 1 via ORAL

## 2023-03-24 MED ORDER — METFORMIN HCL 500 MG PO TABS
500.0000 mg | ORAL_TABLET | Freq: Every day | ORAL | Status: DC
  Administered 2023-03-25 – 2023-03-30 (×6): 500 mg via ORAL
  Filled 2023-03-24 (×10): qty 1

## 2023-03-24 MED ORDER — AMANTADINE HCL 100 MG PO CAPS
100.0000 mg | ORAL_CAPSULE | Freq: Two times a day (BID) | ORAL | Status: DC
  Administered 2023-03-24 – 2023-03-25 (×2): 100 mg via ORAL
  Filled 2023-03-24 (×7): qty 1

## 2023-03-24 MED ORDER — METFORMIN HCL 500 MG PO TABS
1000.0000 mg | ORAL_TABLET | Freq: Every day | ORAL | Status: DC
  Administered 2023-03-24 – 2023-03-31 (×8): 1000 mg via ORAL
  Filled 2023-03-24 (×10): qty 2

## 2023-03-24 MED ORDER — TRAZODONE HCL 50 MG PO TABS
50.0000 mg | ORAL_TABLET | Freq: Every evening | ORAL | Status: DC | PRN
  Administered 2023-03-25 – 2023-03-26 (×2): 50 mg via ORAL
  Filled 2023-03-24 (×4): qty 1

## 2023-03-24 MED ORDER — DIPHENHYDRAMINE HCL 25 MG PO CAPS: 50.0000 mg | ORAL_CAPSULE | Freq: Three times a day (TID) | ORAL | Status: DC | PRN

## 2023-03-24 MED ORDER — LITHIUM CARBONATE ER 450 MG PO TBCR
900.0000 mg | EXTENDED_RELEASE_TABLET | Freq: Every day | ORAL | Status: DC
  Administered 2023-03-24 – 2023-03-25 (×2): 900 mg via ORAL
  Filled 2023-03-24 (×3): qty 2

## 2023-03-24 MED ORDER — APIXABAN 5 MG PO TABS
5.0000 mg | ORAL_TABLET | Freq: Two times a day (BID) | ORAL | Status: DC
  Administered 2023-03-24 – 2023-03-31 (×14): 5 mg via ORAL
  Filled 2023-03-24 (×19): qty 1

## 2023-03-24 MED ORDER — LINACLOTIDE 290 MCG PO CAPS
290.0000 ug | ORAL_CAPSULE | Freq: Every day | ORAL | Status: DC
  Administered 2023-03-25 – 2023-03-30 (×5): 290 ug via ORAL
  Filled 2023-03-24 (×10): qty 1

## 2023-03-24 NOTE — Progress Notes (Signed)
Pt pleasant on approach, states she hopes she can talk with a Training and development officer.  Pt states she is unhappy with her current placement and would like a new guardian.  Pt participated in group, no distress noted, taking medication as ordered.  Will continue to monitor.      03/24/23 2257  Psych Admission Type (Psych Patients Only)  Admission Status Voluntary  Psychosocial Assessment  Patient Complaints None  Eye Contact Fair  Facial Expression Animated  Affect Appropriate to circumstance  Speech Logical/coherent  Interaction Assertive  Motor Activity Fidgety  Appearance/Hygiene Unremarkable  Behavior Characteristics Appropriate to situation  Mood Anxious;Pleasant  Thought Process  Coherency WDL  Content Blaming others  Delusions None reported or observed  Perception WDL  Hallucination None reported or observed  Judgment Limited  Confusion None  Danger to Self  Current suicidal ideation? Denies  Self-Injurious Behavior No self-injurious ideation or behavior indicators observed or expressed   Agreement Not to Harm Self Yes  Description of Agreement verbal  Danger to Others  Danger to Others None reported or observed

## 2023-03-24 NOTE — Consult Note (Incomplete)
[facility] Consult Note  Patient Name: Holly [name] MRN: [id number] DOB: [birth year] DATE OF Consult: 03/24/2023  PRIMARY PSYCHIATRIC DIAGNOSES  1.  *** 2.  *** 3.  ***  RECOMMENDATIONS  {Recommendations:304550007::"Medication recommendations: ***","Non-Medication/therapeutic recommendations: ***","Communication: Treatment team members (and family members if applicable) who were involved in treatment/care discussions and planning, and with whom we spoke or engaged with via secure text/chat, include the following: ***"}  Thank you for involving [place] in the care of this patient. If you have any additional questions or concerns, please call [phone] and ask for me or the provider on-call.  TELEPSYCHIATRY ATTESTATION & CONSENT  As the provider for this telehealth consult, I attest that I verified the patient's identity using two separate identifiers, introduced myself to the patient, provided my credentials, disclosed my location, and performed this encounter via a HIPAA-compliant, real-time, face-to-face, two-way, interactive audio and video platform and with the full consent and agreement of the patient (or guardian as applicable.)  Patient physical location: [facility]. Telehealth provider physical location: home office in state of [place].  Video start time: *** (Central Time) Video end time: *** (Central Time)  IDENTIFYING DATA  Holly [name] is a 27 y.o. year-old female for whom a psychiatric consultation has been ordered by the primary provider. The patient was identified using two separate identifiers.  CHIEF COMPLAINT/REASON FOR CONSULT  ***  HISTORY OF PRESENT ILLNESS (HPI)  The patient ***.  PAST PSYCHIATRIC HISTORY  *** Otherwise as per HPI above.  PAST MEDICAL HISTORY  Past Medical History:  Diagnosis Date  . ADHD (attention deficit hyperactivity disorder)   . Bipolar 1 disorder (HCC)   . Depression   . Diabetes mellitus   . Gluten free diet   .  Hypothyroidism   . Mood disorder (HCC)   . 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  . Obesity   . PTSD (post-traumatic stress disorder)    ***  HOME MEDICATIONS  Facility Ordered Medications  Medication  . acetaminophen (TYLENOL) tablet 650 mg  . lithium carbonate (ESKALITH) ER tablet 900 mg  . amantadine (SYMMETREL) capsule 100 mg  . linaclotide (LINZESS) capsule 290 mcg  . metFORMIN (GLUCOPHAGE) tablet 1,000 mg  . traZODone (DESYREL) tablet 150 mg  . acetaminophen (TYLENOL) tablet 650 mg  . apixaban (ELIQUIS) tablet 5 mg  . metFORMIN (GLUCOPHAGE) tablet 500 mg  . citalopram (CELEXA) tablet 20 mg  . OLANZapine-Samidorphan 10-10 MG TABS 1 tablet  . levothyroxine (SYNTHROID) tablet 100 mcg  . menthol-cetylpyridinium (CEPACOL) lozenge 3 mg   PTA Medications  Medication Sig  . levothyroxine (SYNTHROID) 100 MCG tablet Take 1 tablet (100 mcg total) by mouth daily before breakfast for 7 days. For low thyroid function (Patient taking differently: Take 100 mcg by mouth daily.)  . lithium carbonate (LITHOBID) 300 MG ER tablet Take 3 tablets (900 mg total) by mouth at bedtime for 7 days. For mood stabilization (Patient taking differently: Take 300 mg by mouth at bedtime.)  . amantadine (SYMMETREL) 100 MG capsule Take 1 capsule (100 mg total) by mouth 2 (two) times daily for 7 days. Takes 1 at 8AM and 1 at 5PM (Patient taking differently: Take 100 mg by mouth See admin instructions. Give 100 mg (1 capsule) twice daily at 0800 and 1700.)  . linaclotide (LINZESS) 290 MCG CAPS capsule Take 290 mcg by mouth daily.  . metFORMIN (GLUCOPHAGE) 1000 MG tablet Take 1,000 mg by mouth at bedtime.  . traZODone (DESYREL) 150 MG tablet Take  150 mg by mouth at bedtime.  Marland Kitchen omeprazole (PRILOSEC) 20 MG capsule Take 20 mg by mouth daily.  Marland Kitchen acetaminophen (TYLENOL) 325 MG tablet Take 2 tablets (650 mg total) by mouth every 4 (four) hours as needed for fever or mild pain.  Marland Kitchen apixaban (ELIQUIS) 5 MG TABS tablet Take 5 mg by  mouth 2 (two) times daily.  . metFORMIN (GLUCOPHAGE) 500 MG tablet Take 500 mg by mouth daily.  . citalopram (CELEXA) 20 MG tablet Take 20 mg by mouth daily.  Marland Kitchen OLANZapine-Samidorphan (LYBALVI) 10-10 MG TABS Take 1 tablet by mouth at bedtime.   ***  ALLERGIES  No Known Allergies  SOCIAL & SUBSTANCE USE HISTORY  Social History   Socioeconomic History  . Marital status: Single    Spouse name: Not on file  . Number of children: Not on file  . Years of education: Not on file  . Highest education level: Not on file  Occupational History  . Not on file  Tobacco Use  . Smoking status: Never  . Smokeless tobacco: Never  Vaping Use  . Vaping status: Never Used  Substance and Sexual Activity  . Alcohol use: No  . Drug use: No  . Sexual activity: Never    Birth control/protection: Injection  Other Topics Concern  . Not on file  Social History Narrative   ** Merged History Encounter **       Social Determinants of Health   Financial Resource Strain: Not on file  Food Insecurity: Not on file  Transportation Needs: Not on file  Physical Activity: Not on file  Stress: Not on file  Social Connections: Not on file   Social History   Tobacco Use  Smoking Status Never  Smokeless Tobacco Never   Social History   Substance and Sexual Activity  Alcohol Use No   Social History   Substance and Sexual Activity  Drug Use No    Additional pertinent information ***.  FAMILY HISTORY  Family History  Adopted: Yes   Family Psychiatric History (if known):  ***  MENTAL STATUS EXAM (MSE)  Presentation  General Appearance:  Appropriate for Environment  Eye Contact: Good  Speech: Clear and Coherent  Speech Volume: Normal  Handedness: Right   Mood and Affect  Mood: Euthymic  Affect: Congruent   Thought Process  Thought Processes: Coherent  Descriptions of Associations: Intact  Orientation: Full (Time, Place and Person)  Thought  Content: WDL  History of Schizophrenia/Schizoaffective disorder: Yes  Duration of Psychotic Symptoms: Greater than six months  Hallucinations:No data recorded Ideas of Reference: None  Suicidal Thoughts:No data recorded Homicidal Thoughts:No data recorded  Sensorium  Memory: Immediate Fair; Recent Fair; Remote Fair  Judgment: Poor  Insight: Poor   Executive Functions  Concentration: Fair  Attention Span: Fair  Recall: [employer] of Knowledge: Fair  Language: Fair   Psychomotor Activity  Psychomotor Activity:No data recorded  Assets  Assets: Desire for Improvement; Social Support; Leisure Time   Sleep  Sleep:No data recorded  VITALS  Blood pressure (!) 140/91, pulse 77, temperature (!) 97.5 F (36.4 C), temperature source Oral, resp. rate 18, height 5\' 5"  (1.651 m), weight 107.4 kg, SpO2 99%.  LABS  Admission on 03/23/2023  Component Date Value Ref Range Status  . Sodium 03/23/2023 135  135 - 145 mmol/L Final  . Potassium 03/23/2023 3.2 (L)  3.5 - 5.1 mmol/L Final  . Chloride 03/23/2023 104  98 - 111 mmol/L Final  . CO2 03/23/2023 23  22 - 32 mmol/L Final  . Glucose, Bld 03/23/2023 103 (H)  70 - 99 mg/dL Final   Glucose reference range applies only to samples taken after fasting for at least 8 hours.  . BUN 03/23/2023 7  6 - 20 mg/dL Final  . Creatinine, Ser 03/23/2023 0.84  0.44 - 1.00 mg/dL Final  . Calcium 54/01/8118 9.3  8.9 - 10.3 mg/dL Final  . Total Protein 03/23/2023 7.8  6.5 - 8.1 g/dL Final  . Albumin 14/78/2956 4.0  3.5 - 5.0 g/dL Final  . AST 21/30/8657 15  15 - 41 U/L Final  . ALT 03/23/2023 9  0 - 44 U/L Final  . Alkaline Phosphatase 03/23/2023 85  38 - 126 U/L Final  . Total Bilirubin 03/23/2023 0.4  <1.2 mg/dL Final  . GFR, Estimated 03/23/2023 >60  >60 mL/min Final   Comment: (NOTE) Calculated using the CKD-EPI Creatinine Equation (2021)   . Anion gap 03/23/2023 8  5 - 15 Final   Performed at [facility], [address] [place], [place] [address]  . Alcohol, Ethyl (B) 03/23/2023 <10  <10 mg/dL Final   Comment: (NOTE) Lowest detectable limit for serum alcohol is 10 mg/dL.  For medical purposes only. Performed at [facility], [address], [place], [place] [address]   . Opiates 03/23/2023 NONE DETECTED  NONE DETECTED Final  . Cocaine 03/23/2023 NONE DETECTED  NONE DETECTED Final  . Benzodiazepines 03/23/2023 NONE DETECTED  NONE DETECTED Final  . Amphetamines 03/23/2023 NONE DETECTED  NONE DETECTED Final  . Tetrahydrocannabinol 03/23/2023 NONE DETECTED  NONE DETECTED Final  . Barbiturates 03/23/2023 NONE DETECTED  NONE DETECTED Final   Comment: (NOTE) DRUG SCREEN FOR MEDICAL PURPOSES ONLY.  IF CONFIRMATION IS NEEDED FOR ANY PURPOSE, NOTIFY LAB WITHIN 5 DAYS.  LOWEST DETECTABLE LIMITS FOR URINE DRUG SCREEN Drug Class                     Cutoff (ng/mL) Amphetamine and metabolites    1000 Barbiturate and metabolites    200 Benzodiazepine                 200 Opiates and metabolites        300 Cocaine and metabolites        300 THC                            50 Performed at [facility], [address], [place], [place] [address]   . WBC 03/23/2023 10.2  4.0 - 10.5 K/uL Final  . RBC 03/23/2023 3.99  3.87 - 5.11 MIL/uL Final  . Hemoglobin 03/23/2023 11.5 (L)  12.0 - 15.0 g/dL Final  . HCT 40/02/2724 36.5  36.0 - 46.0 % Final  . MCV 03/23/2023 91.5  80.0 - 100.0 fL Final  . MCH 03/23/2023 28.8  26.0 - 34.0 pg Final  . MCHC 03/23/2023 31.5  30.0 - 36.0 g/dL Final  . RDW 36/64/4034 15.5  11.5 - 15.5 % Final  . Platelets 03/23/2023 321  150 - 400 K/uL Final  . nRBC 03/23/2023 0.0  0.0 - 0.2 % Final  . Neutrophils Relative % 03/23/2023 71  % Final  . Neutro Abs 03/23/2023 7.2  1.7 - 7.7 K/uL Final  . Lymphocytes Relative 03/23/2023 21  % Final  . Lymphs Abs 03/23/2023 2.2  0.7 - 4.0 K/uL Final  . Monocytes Relative 03/23/2023 6  %  Final  . Monocytes Absolute 03/23/2023 0.6  0.1 - 1.0 K/uL Final  . Eosinophils Relative 03/23/2023 1  % Final  . Eosinophils Absolute 03/23/2023 0.1  0.0 - 0.5 K/uL Final  . Basophils Relative 03/23/2023 1  % Final  . Basophils Absolute 03/23/2023 0.1  0.0 - 0.1 K/uL Final  . Immature Granulocytes 03/23/2023 0  % Final  . Abs Immature Granulocytes 03/23/2023 0.03  0.00 - 0.07 K/uL Final   Performed at [facility], [address] [place], [place] [address]  . Preg, Serum 03/23/2023 NEGATIVE  NEGATIVE Final   Comment:        THE SENSITIVITY OF THIS METHODOLOGY IS >10 mIU/mL. Performed at [facility], [address], [place], [place] [address]   . Salicylate Lvl 03/23/2023 <7.0 (L)  7.0 - 30.0 mg/dL Final   Performed at [facility], [address] [place], [place] [address]  . Acetaminophen (Tylenol), Serum 03/23/2023 <10 (L)  10 - 30 ug/mL Final   Comment: (NOTE) Therapeutic concentrations vary significantly. A range of 10-30 ug/mL  may be an effective concentration for many patients. However, some  are best treated at concentrations outside of this range. Acetaminophen concentrations >150 ug/mL at 4 hours after ingestion  and >50 ug/mL at 12 hours after ingestion are often associated with  toxic reactions.  Performed at [facility], [address], [place], [place] [address]     PSYCHIATRIC REVIEW OF SYSTEMS (ROS)  ROS: Notable for the following relevant positive findings: ROS  Additional findings:      Musculoskeletal: {Musculoskeletal neeeds/assessment:304550014}      Gait & Station: {Gait and Station:304550016}      Pain Screening: {Pain Description:304550015}      Nutrition & Dental Concerns: {Nutrition & Dental Concerns:304550017}  RISK FORMULATION/ASSESSMENT  Is the patient experiencing any suicidal or homicidal ideations: {yes/no:20286}       Explain if yes: *** Protective factors  considered for safety management: ***  Risk factors/concerns considered for safety management: *** {CHL BH Risk Factors Safety Management:304550011}  Is there a safety management plan with the patient and treatment team to minimize risk factors and promote protective factors: {yes/no:20286}           Explain: *** Is crisis care placement or psychiatric hospitalization recommended: {yes/no:20286}     Based on my current evaluation and risk assessment, patient is determined at this time to be at:  {Risk level:304550009}  *RISK ASSESSMENT Risk assessment is a dynamic process; it is possible that this patient's condition, and risk level, may change. This should be re-evaluated and managed over time as appropriate. Please re-consult psychiatric consult services if additional assistance is needed in terms of risk assessment and management. If your team decides to discharge this patient, please advise the patient how to best access emergency psychiatric services, or to call 911, if their condition worsens or they feel unsafe in any way.   Norval Morton, NP Telepsychiatry Consult Services

## 2023-03-24 NOTE — Progress Notes (Signed)
LCSW Progress Note  [id number]   Holly [name]  03/24/2023  9:33 AM    Inpatient Behavioral Health Placement  Pt meets inpatient criteria per Norval Morton, NP Telepsychiatry Consult Services. There are no available beds within [facility] per [facility] Erica Wright,RN. Referral was sent to the following facilities;   Destination  Service Provider Address Phone [facility]  [address] [place], [place] [address] [phone]  [facility] Patient Placement  [facility], [place] [phone]  [facility]  [address] [place] [address] [phone]  [facility] [address]  [address] [place] [address] [phone]  [facility]  [address] [place], [place] [address] [phone]  [facility]  [address], [place] [address] [phone]  [facility]  [address] [place] [address] [phone]  [facility]  [address] [place] [address] [phone]  [facility]  [address] [place] [address] [phone]  [facility]  [address] [place] [address] [phone]  [facility]  [address], [place] [address] [phone]  [facility]  [address] [place] [address] [phone]  [facility]  [place] [phone]  [facility]  [address] [place] [address] [phone]  [facility]  [address] [place], [place] [phone]  [facility]  [address],  [place] [address] [phone]  [facility]  [address], [place] [address] [phone]  [facility]  [address] [place], [place] [address] [phone]  [facility]  [address], [place] [address] [phone]  [facility]  [address] [place] [address] [phone]  [facility]  [place] [phone]  [facility]  [address] [place] [address] [phone]  [facility]  [address], [place] [address] [phone]  [facility]  [address] [place] [address] [phone]    Situation ongoing,  CSW will follow up.    Maryjean Ka, MSW, [facility] 03/24/2023 9:33 AM
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
## 2023-03-24 NOTE — Consult Note (Signed)
[facility] Consult Note  Patient Name: Holly [name] MRN: [id number] DOB: Jan 27, 1996 DATE OF Consult: 03/24/2023  PRIMARY PSYCHIATRIC DIAGNOSES  1.  Suicidal ideations 2.  MDD 3.  PTSD  RECOMMENDATIONS  Admit to inpatient mental treatment for safety and stabilization.  Medication recommendations: Continue home medications as prescribed  Non-Medication/therapeutic recommendations: Refer to outpatient psychiatric provider for medication management and therapy upon discharge from inpatient psych admission.    Communication: Treatment team members (and family members if applicable) who were involved in treatment/care discussions and planning, and with whom we spoke or engaged with via secure text/chat, include the following:  treatment team   Thank you for involving [place] in the care of this patient. If you have any additional questions or concerns, please call [phone] and ask for me or the provider on-call.  TELEPSYCHIATRY ATTESTATION & CONSENT  As the provider for this telehealth consult, I attest that I verified the patient's identity using two separate identifiers, introduced myself to the patient, provided my credentials, disclosed my location, and performed this encounter via a HIPAA-compliant, real-time, face-to-face, two-way, interactive audio and video platform and with the full consent and agreement of the patient (or guardian as applicable.)  Patient physical location: [facility]. Telehealth provider physical location: home office in state of [place].  Video start time: 2307 (Central Time) Video end time: 2317 (Central Time)  IDENTIFYING DATA  Holly [name] is a 27 y.o. year-old female for whom a psychiatric consultation has been ordered by the primary provider. The patient was identified using two separate identifiers.  CHIEF COMPLAINT/REASON FOR CONSULT  - Feeling suicidal - Cut arm - Issues with current living situation - Feeling mistreated by  guardian and caregiver  HISTORY OF PRESENT ILLNESS (HPI)  The patient is a 27 year old female who presented with suicidal ideation and self-inflicted superficial cuts on her arm to kill herself. She reported feeling suicidal due to the environment in her current home, where she feels mistreated and unsupported. She mentioned that her requests for hospital assistance were ignored by her caregivers. The patient described her living situation as stressful, with a caregiver who is overly meticulous and provides unsatisfactory meals, leading to her eating very little. Additionally, a recent meeting with her guardian and others, which she was not informed about, left her feeling blamed and uncared for.  She expressed frustration over the denial of a service dog, which she believes would help her cope. The patient lives in an apartment owned by someone else and participates in [employer] activities, such as working out at [employer]. She was last admitted to the hospital a few months ago for suicidal thoughts and continues to feel suicidal, with a current plan to drink bleach.  She denied experiencing hallucinations but admitted to feeling depressed, hopeless, and worthless. Occasionally, she feels that others are trying to harm her, though not at present. Her sleep is poor without medication due to racing thoughts. Her guardian, who is not a family member, restricts her contact with her family and does not allow her to have a cell phone, citing concerns about her giving out the number.  The patient has been a ward of the state since age [age] due to her mother's alcoholism. She attributes her anger issues to mistreatment and feels that her previous guardian allowed her more independence, including holding a job. She denied any physical aggression, stating that her anger manifests as yelling. The patient expressed a desire to change her guardian but is unsure  how to proceed.  PAST PSYCHIATRIC HISTORY  -  Previous hospitalization a few months ago for suicidal thoughts - History of anger issues - Foster care since age 69 due to mother's alcoholism  PAST MEDICAL HISTORY  Past Medical History:  Diagnosis Date   ADHD (attention deficit hyperactivity disorder)    Bipolar 1 disorder (HCC)    Depression    Diabetes mellitus    Gluten free diet    Hypothyroidism    Mood disorder (HCC)    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   Obesity    PTSD (post-traumatic stress disorder)      HOME MEDICATIONS  Facility Ordered Medications  Medication   acetaminophen (TYLENOL) tablet 650 mg   lithium carbonate (ESKALITH) ER tablet 900 mg   amantadine (SYMMETREL) capsule 100 mg   linaclotide (LINZESS) capsule 290 mcg   metFORMIN (GLUCOPHAGE) tablet 1,000 mg   traZODone (DESYREL) tablet 150 mg   acetaminophen (TYLENOL) tablet 650 mg   apixaban (ELIQUIS) tablet 5 mg   metFORMIN (GLUCOPHAGE) tablet 500 mg   citalopram (CELEXA) tablet 20 mg   OLANZapine-Samidorphan 10-10 MG TABS 1 tablet   levothyroxine (SYNTHROID) tablet 100 mcg   menthol-cetylpyridinium (CEPACOL) lozenge 3 mg   PTA Medications  Medication Sig   levothyroxine (SYNTHROID) 100 MCG tablet Take 1 tablet (100 mcg total) by mouth daily before breakfast for 7 days. For low thyroid function (Patient taking differently: Take 100 mcg by mouth daily.)   lithium carbonate (LITHOBID) 300 MG ER tablet Take 3 tablets (900 mg total) by mouth at bedtime for 7 days. For mood stabilization (Patient taking differently: Take 300 mg by mouth at bedtime.)   amantadine (SYMMETREL) 100 MG capsule Take 1 capsule (100 mg total) by mouth 2 (two) times daily for 7 days. Takes 1 at 8AM and 1 at 5PM (Patient taking differently: Take 100 mg by mouth See admin instructions. Give 100 mg (1 capsule) twice daily at 0800 and 1700.)   linaclotide (LINZESS) 290 MCG CAPS capsule Take 290 mcg by mouth daily.   metFORMIN (GLUCOPHAGE) 1000 MG tablet Take 1,000 mg by mouth at bedtime.    traZODone (DESYREL) 150 MG tablet Take 150 mg by mouth at bedtime.   omeprazole (PRILOSEC) 20 MG capsule Take 20 mg by mouth daily.   acetaminophen (TYLENOL) 325 MG tablet Take 2 tablets (650 mg total) by mouth every 4 (four) hours as needed for fever or mild pain.   apixaban (ELIQUIS) 5 MG TABS tablet Take 5 mg by mouth 2 (two) times daily.   metFORMIN (GLUCOPHAGE) 500 MG tablet Take 500 mg by mouth daily.   citalopram (CELEXA) 20 MG tablet Take 20 mg by mouth daily.   OLANZapine-Samidorphan (LYBALVI) 10-10 MG TABS Take 1 tablet by mouth at bedtime.     ALLERGIES  No Known Allergies  SOCIAL & SUBSTANCE USE HISTORY  Social History   Socioeconomic History   Marital status: Single    Spouse name: Not on file   Number of children: Not on file   Years of education: Not on file   Highest education level: Not on file  Occupational History   Not on file  Tobacco Use   Smoking status: Never   Smokeless tobacco: Never  Vaping Use   Vaping status: Never Used  Substance and Sexual Activity   Alcohol use: No   Drug use: No   Sexual activity: Never    Birth control/protection: Injection  Other Topics Concern   Not  on file  Social History Narrative   ** Merged History Encounter **       Social Determinants of Health   Financial Resource Strain: Not on file  Food Insecurity: Not on file  Transportation Needs: Not on file  Physical Activity: Not on file  Stress: Not on file  Social Connections: Not on file   Social History   Tobacco Use  Smoking Status Never  Smokeless Tobacco Never   Social History   Substance and Sexual Activity  Alcohol Use No   Social History   Substance and Sexual Activity  Drug Use No    Additional pertinent information   FAMILY HISTORY  Family History  Adopted: Yes   Family Psychiatric History (if known):  Patient's mother was an alcoholic and signed away her parental rights when the patient was 27 years old.  MENTAL STATUS EXAM (MSE)   Presentation  General Appearance:  Appropriate for Environment  Eye Contact: Good  Speech: Clear and Coherent  Speech Volume: Normal  Handedness: Right   Mood and Affect  Mood: Depressed; Hopeless; Worthless  Affect: Solicitor Processes: Coherent  Descriptions of Associations: Intact  Orientation: Full (Time, Place and Person)  Thought Content: Logical  History of Schizophrenia/Schizoaffective disorder: Yes  Duration of Psychotic Symptoms: Greater than six months  Hallucinations:No data recorded Ideas of Reference: None  Suicidal Thoughts:Suicidal Thoughts: Yes, Active  Homicidal Thoughts:Homicidal Thoughts: No   Sensorium  Memory: Immediate Fair; Recent Fair; Remote Fair  Judgment: Poor  Insight: Poor   Executive Functions  Concentration: Fair  Attention Span: Fair  Recall: Fair  Fund of Knowledge: Fair  Language: Fair   Psychomotor Activity  Psychomotor Activity:No data recorded  Assets  Assets: Desire for Improvement; Social Support; Leisure Time   Sleep  Sleep:Sleep: -- (sleeps okay when takes her medications)   VITALS  Blood pressure (!) 140/91, pulse 77, temperature (!) 97.5 F (36.4 C), temperature source Oral, resp. rate 18, height 5\' 5"  (1.651 m), weight 107.4 kg, SpO2 99%.  LABS  Admission on 03/23/2023  Component Date Value Ref Range Status   Sodium 03/23/2023 135  135 - 145 mmol/L Final   Potassium 03/23/2023 3.2 (L)  3.5 - 5.1 mmol/L Final   Chloride 03/23/2023 104  98 - 111 mmol/L Final   CO2 03/23/2023 23  22 - 32 mmol/L Final   Glucose, Bld 03/23/2023 103 (H)  70 - 99 mg/dL Final   Glucose reference range applies only to samples taken after fasting for at least 8 hours.   BUN 03/23/2023 7  6 - 20 mg/dL Final   Creatinine, Ser 03/23/2023 0.84  0.44 - 1.00 mg/dL Final   Calcium 16/02/9603 9.3  8.9 - 10.3 mg/dL Final   Total Protein 54/01/8118 7.8  6.5 - 8.1 g/dL Final    Albumin 14/78/2956 4.0  3.5 - 5.0 g/dL Final   AST 21/30/8657 15  15 - 41 U/L Final   ALT 03/23/2023 9  0 - 44 U/L Final   Alkaline Phosphatase 03/23/2023 85  38 - 126 U/L Final   Total Bilirubin 03/23/2023 0.4  <1.2 mg/dL Final   GFR, Estimated 03/23/2023 >60  >60 mL/min Final   Comment: (NOTE) Calculated using the CKD-EPI Creatinine Equation (2021)    Anion gap 03/23/2023 8  5 - 15 Final   Performed at [facility], [address], [place], [place] [address]   Alcohol, Ethyl (B) 03/23/2023 <10  <10 mg/dL Final  Comment: (NOTE) Lowest detectable limit for serum alcohol is 10 mg/dL.  For medical purposes only. Performed at [facility], [address], [place], [place] [address]    Opiates 03/23/2023 NONE DETECTED  NONE DETECTED Final   Cocaine 03/23/2023 NONE DETECTED  NONE DETECTED Final   Benzodiazepines 03/23/2023 NONE DETECTED  NONE DETECTED Final   Amphetamines 03/23/2023 NONE DETECTED  NONE DETECTED Final   Tetrahydrocannabinol 03/23/2023 NONE DETECTED  NONE DETECTED Final   Barbiturates 03/23/2023 NONE DETECTED  NONE DETECTED Final   Comment: (NOTE) DRUG SCREEN FOR MEDICAL PURPOSES ONLY.  IF CONFIRMATION IS NEEDED FOR ANY PURPOSE, NOTIFY LAB WITHIN 5 DAYS.  LOWEST DETECTABLE LIMITS FOR URINE DRUG SCREEN Drug Class                     Cutoff (ng/mL) Amphetamine and metabolites    1000 Barbiturate and metabolites    200 Benzodiazepine                 200 Opiates and metabolites        300 Cocaine and metabolites        300 THC                            50 Performed at [facility], [address] [place], [place] [address]    WBC 03/23/2023 10.2  4.0 - 10.5 K/uL Final   RBC 03/23/2023 3.99  3.87 - 5.11 MIL/uL Final   Hemoglobin 03/23/2023 11.5 (L)  12.0 - 15.0 g/dL Final   HCT 09/81/1914 36.5  36.0 - 46.0 % Final   MCV 03/23/2023 91.5  80.0 - 100.0 fL Final   MCH 03/23/2023 28.8  26.0 - 34.0 pg Final    MCHC 03/23/2023 31.5  30.0 - 36.0 g/dL Final   RDW 78/29/5621 15.5  11.5 - 15.5 % Final   Platelets 03/23/2023 321  150 - 400 K/uL Final   nRBC 03/23/2023 0.0  0.0 - 0.2 % Final   Neutrophils Relative % 03/23/2023 71  % Final   Neutro Abs 03/23/2023 7.2  1.7 - 7.7 K/uL Final   Lymphocytes Relative 03/23/2023 21  % Final   Lymphs Abs 03/23/2023 2.2  0.7 - 4.0 K/uL Final   Monocytes Relative 03/23/2023 6  % Final   Monocytes Absolute 03/23/2023 0.6  0.1 - 1.0 K/uL Final   Eosinophils Relative 03/23/2023 1  % Final   Eosinophils Absolute 03/23/2023 0.1  0.0 - 0.5 K/uL Final   Basophils Relative 03/23/2023 1  % Final   Basophils Absolute 03/23/2023 0.1  0.0 - 0.1 K/uL Final   Immature Granulocytes 03/23/2023 0  % Final   Abs Immature Granulocytes 03/23/2023 0.03  0.00 - 0.07 K/uL Final   Performed at [facility], [address], [place], [place] [address]   Preg, Serum 03/23/2023 NEGATIVE  NEGATIVE Final   Comment:        THE SENSITIVITY OF THIS METHODOLOGY IS >10 mIU/mL. Performed at [facility], [address] [place], [place] [address]    Salicylate Lvl 03/23/2023 <7.0 (L)  7.0 - 30.0 mg/dL Final   Performed at [facility], [address] [place], [place] [address]   Acetaminophen (Tylenol), Serum 03/23/2023 <10 (L)  10 - 30 ug/mL Final   Comment: (NOTE) Therapeutic concentrations vary significantly. A range of 10-30 ug/mL  may be an effective concentration for  many patients. However, some  are best treated at concentrations outside of this range. Acetaminophen concentrations >150 ug/mL at 4 hours after ingestion  and >50 ug/mL at 12 hours after ingestion are often associated with  toxic reactions.  Performed at [facility], [address], [place], [place] [address]     PSYCHIATRIC REVIEW OF SYSTEMS (ROS)  She expressed feelings of depression, hopelessness, and worthlessness. The patient  expressed suicidal ideation and mentioned a specific plan to drink bleach. She denied experiencing audiovisual hallucinations or paranoia at the moment but mentioned occasional thoughts that others might try to hurt her.  Additional findings:      Musculoskeletal: No abnormal movements observed      Gait & Station: Laying/Sitting      Pain Screening: Denies      Nutrition & Dental Concerns: none  RISK FORMULATION/ASSESSMENT  Is the patient experiencing any suicidal or homicidal ideations: Yes       Explain if yes: The patient expressed suicidal ideation and mentioned a specific plan to drink bleach. Protective factors considered for safety management: access to appropriate clinical services  Risk factors/concerns considered for safety management:  Prior attempt Depression Hopelessness Impulsivity  Is there a safety management plan with the patient and treatment team to minimize risk factors and promote protective factors: Yes           Explain: Admit to psych Is crisis care placement or psychiatric hospitalization recommended: Yes     Based on my current evaluation and risk assessment, patient is determined at this time to be at:  Moderate Risk  *RISK ASSESSMENT Risk assessment is a dynamic process; it is possible that this patient's condition, and risk level, may change. This should be re-evaluated and managed over time as appropriate. Please re-consult psychiatric consult services if additional assistance is needed in terms of risk assessment and management. If your team decides to discharge this patient, please advise the patient how to best access emergency psychiatric services, or to call 911, if their condition worsens or they feel unsafe in any way.  The patient is a 27 year old woman with a history of major depressive disorder, schizoaffective disorder, PTSD, Mild intellectual disability and ADHD. She has been experiencing suicidal ideation and recently cut her arm superficially.  She reports feeling depressed, hopeless, and worthless. She has a history of being in foster care since the age of [age] due to her mother's alcoholism. She currently lives in an apartment with a worker and has a guardian who restricts her contact with her family and does not allow her to have a cell phone. She reports poor sleep without medication.  The patient feels mistreated and unsupported by her current living situation and guardian. She reports that her guardian and the worker she lives with do not listen to her needs or preferences, contributing to her feelings of isolation and helplessness. She has a history of anger issues, which she attributes to being mistreated. She has been admitted to the hospital multiple times for suicidal thoughts and continues to feel suicidal with a plan to drink bleach.   Norval Morton, NP Telepsychiatry Consult Services

## 2023-03-24 NOTE — Progress Notes (Signed)
Pt was accepted to [facility] TODAY 03/24/2023; Bed Assignment 502-2  Per [facility] Signed voluntary consent has been received.  Pt meets inpatient criteria per Norval Morton, NP Telepsychiatry Consult Services.  Attending Physician will be Dr. Sarita Bottom, MD   Report can be called to: -Adult unit: [phone]  Pt can arrive after: 2:00pm  Care Team notified: [facility], Idonea Lewis,RN, Joelene Millin [place], [place] Noe Fiscus,RN, Geralynn [facility] [place], PMHNP    [place], [place] 03/24/2023 @ 12:20 PM

## 2023-03-25 LAB — LIPID PANEL
Cholesterol: 122 mg/dL (ref 0–200)
HDL: 47 mg/dL (ref >40)
LDL Cholesterol: 53 mg/dL (ref 0–99)
Total CHOL/HDL Ratio: 2.6 {ratio}
Triglycerides: 112 mg/dL (ref <150)
VLDL: 22 mg/dL (ref 0–40)

## 2023-03-25 LAB — TSH: TSH: 3.105 u[IU]/mL (ref 0.350–4.500)

## 2023-03-25 LAB — LITHIUM LEVEL: Lithium Lvl: 0.36 mmol/L — ABNORMAL LOW (ref 0.60–1.20)

## 2023-03-25 LAB — VITAMIN B12: Vitamin B-12: 155 pg/mL — ABNORMAL LOW (ref 180–914)

## 2023-03-25 LAB — FOLATE: Folate: 9.1 ng/mL (ref >5.9)

## 2023-03-25 MED ORDER — OLANZAPINE 10 MG PO TABS
10.0000 mg | ORAL_TABLET | Freq: Every day | ORAL | Status: DC
  Administered 2023-03-25 – 2023-03-30 (×6): 10 mg via ORAL
  Filled 2023-03-25 (×9): qty 1

## 2023-03-25 MED ORDER — AMANTADINE HCL 100 MG PO CAPS
100.0000 mg | ORAL_CAPSULE | Freq: Every day | ORAL | Status: DC
  Administered 2023-03-26: 100 mg via ORAL
  Filled 2023-03-25 (×2): qty 1

## 2023-03-25 MED ORDER — POTASSIUM CHLORIDE 20 MEQ PO PACK
40.0000 meq | PACK | Freq: Once | ORAL | Status: AC
  Administered 2023-03-25: 40 meq via ORAL
  Filled 2023-03-25 (×2): qty 2

## 2023-03-25 MED ORDER — HYDROCERIN EX CREA
TOPICAL_CREAM | Freq: Every day | CUTANEOUS | Status: DC | PRN
  Filled 2023-03-25: qty 113
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
## 2023-03-25 NOTE — Plan of Care (Signed)
  Problem: Education: Goal: Knowledge of [place] [facility] Education information/materials will improve Outcome: Progressing   

## 2023-03-25 NOTE — Plan of Care (Signed)
  Problem: Education: Goal: Knowledge of [place] [facility] Education information/materials will improve Outcome: Progressing Goal: Emotional status will improve Outcome: Progressing Goal: Mental status will improve Outcome: Progressing Goal: Verbalization of understanding the information provided will improve Outcome: Progressing   Problem: Activity: Goal: Interest or engagement in activities will improve Outcome: Progressing Goal: Sleeping patterns will improve Outcome: Progressing   Problem: Coping: Goal: Ability to verbalize frustrations and anger appropriately will improve Outcome: Progressing Goal: Ability to demonstrate self-control will improve Outcome: Progressing   Problem: Health Behavior/Discharge Planning: Goal: Identification of resources available to assist in meeting health care needs will improve Outcome: Progressing Goal: Compliance with treatment plan for underlying cause of condition will improve Outcome: Progressing   Problem: Physical Regulation: Goal: Ability to maintain clinical measurements within normal limits will improve Outcome: Progressing   Problem: Safety: Goal: Periods of time without injury will increase Outcome: Progressing   Problem: Activity: Goal: Will identify at least one activity in which they can participate Outcome: Progressing   Problem: Coping: Goal: Ability to identify and develop effective coping behavior will improve Outcome: Progressing Goal: Ability to interact with others will improve Outcome: Progressing Goal: Demonstration of participation in decision-making regarding own care will improve Outcome: Progressing Goal: Ability to use eye contact when communicating with others will improve Outcome: Progressing   Problem: Health Behavior/Discharge Planning: Goal: Identification of resources available to assist in meeting health care needs will improve Outcome: Progressing   Problem: Self-Concept: Goal: Will  verbalize positive feelings about self Outcome: Progressing   Problem: Education: Goal: Ability to make informed decisions regarding treatment will improve Outcome: Progressing   Problem: Coping: Goal: Coping ability will improve Outcome: Progressing   Problem: Health Behavior/Discharge Planning: Goal: Identification of resources available to assist in meeting health care needs will improve Outcome: Progressing   Problem: Medication: Goal: Compliance with prescribed medication regimen will improve Outcome: Progressing   Problem: Self-Concept: Goal: Ability to disclose and discuss suicidal ideas will improve Outcome: Progressing Goal: Will verbalize positive feelings about self Outcome: Progressing Note: Patient is not on track and improving. Patient will work on increased adherence

## 2023-03-25 NOTE — Group Note (Signed)
Date:  03/25/2023 Time:  12:23 PM  Group Topic/Focus:  Goals Group:   The focus of this group is to help patients establish daily goals to achieve during treatment and discuss how the patient can incorporate goal setting into their daily lives to aide in recovery.    Participation Level:  Did Not Attend  Margaret Pyle 03/25/2023, 12:23 PM

## 2023-03-25 NOTE — H&P (Cosign Needed Addendum)
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 enclosed that do not reflect the nature of this encounter. Every attempt is made to correct such errors.   Signed: Dr. Karie Fetch, MD PGY-2, Psychiatry Residency  11/16/202412:58 PM
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
## 2023-03-25 NOTE — [facility] Group Notes (Signed)
[facility] Group Notes:  (Nursing/MHT/Case Management/Adjunct)  Date:  03/25/2023  Time:  10:17 PM  Type of Therapy:  Psychoeducational Skills  Participation Level:  Active  Participation Quality:  Appropriate  Affect:  Appropriate  Cognitive:  Appropriate  Insight:  Good  Engagement in Group:  Engaged  Modes of Intervention:  Education  Summary of Progress/Problems:The patient rated her day as a 7 out of 10 since she expects to have a Actuary. In addition, she states that another visitor will be coming in on Monday regarding her housing situation.Her goal for tomorrow is to work on getting closer to God.   Hazle Coca S 03/25/2023, 10:17 PM

## 2023-03-25 NOTE — Progress Notes (Signed)
   03/25/23 1200  Psych Admission Type (Psych Patients Only)  Admission Status Voluntary  Psychosocial Assessment  Patient Complaints None  Eye Contact Fair  Facial Expression Animated  Affect Appropriate to circumstance  Speech Logical/coherent  Interaction Assertive  Motor Activity Fidgety  Appearance/Hygiene Unremarkable  Behavior Characteristics Appropriate to situation  Mood Anxious  Thought Process  Coherency WDL  Content Blaming others  Delusions None reported or observed  Perception WDL  Hallucination None reported or observed  Judgment Limited  Confusion None  Danger to Self  Current suicidal ideation? Denies  Danger to Others  Danger to Others None reported or observed   Pt denies SI/HI/AVH. Pt denies thoughts of self harm at this time. Pt continues to perseverate about her dislike for her current home where she resides.

## 2023-03-25 NOTE — Progress Notes (Signed)
   03/25/23 2135  Psych Admission Type (Psych Patients Only)  Admission Status Voluntary  Psychosocial Assessment  Patient Complaints None  Eye Contact Fair  Facial Expression Animated  Affect Appropriate to circumstance  Speech Logical/coherent  Interaction Assertive  Motor Activity Fidgety  Appearance/Hygiene Unremarkable  Behavior Characteristics Appropriate to situation  Mood Anxious  Thought Process  Coherency WDL  Content WDL  Delusions None reported or observed  Perception WDL  Hallucination None reported or observed  Judgment Limited  Confusion None  Danger to Self  Current suicidal ideation? Denies  Self-Injurious Behavior No self-injurious ideation or behavior indicators observed or expressed   Agreement Not to Harm Self Yes  Description of Agreement verbal  Danger to Others  Danger to Others None reported or observed

## 2023-03-25 NOTE — Progress Notes (Signed)
   03/25/23 0539  15 Minute Checks  Location Bedroom  Visual Appearance Calm  Behavior Composed  Sleep (Behavioral Health Patients Only)  Calculate sleep? (Click Yes once per 24 hr at 0600 safety check) Yes  Documented sleep last 24 hours 5.5

## 2023-03-26 ENCOUNTER — Encounter: Payer: Self-pay | Admitting: Psychiatry

## 2023-03-26 DIAGNOSIS — E538 Deficiency of other specified B group vitamins: Secondary | ICD-10-CM

## 2023-03-26 HISTORY — DX: Deficiency of other specified B group vitamins: E53.8

## 2023-03-26 LAB — HEMOGLOBIN A1C
Hgb A1c MFr Bld: 5 % (ref 4.8–5.6)
Mean Plasma Glucose: 96.8 mg/dL

## 2023-03-26 LAB — VITAMIN D 25 HYDROXY (VIT D DEFICIENCY, FRACTURES): Vit D, 25-Hydroxy: 40.42 ng/mL (ref 30–100)

## 2023-03-26 LAB — RPR: RPR Ser Ql: NONREACTIVE

## 2023-03-26 MED ORDER — LITHIUM CARBONATE ER 300 MG PO TBCR
1200.0000 mg | EXTENDED_RELEASE_TABLET | Freq: Every day | ORAL | Status: DC
  Administered 2023-03-26 – 2023-03-30 (×5): 1200 mg via ORAL
  Filled 2023-03-26 (×7): qty 4

## 2023-03-26 MED ORDER — VITAMIN B-12 1000 MCG PO TABS
1000.0000 ug | ORAL_TABLET | Freq: Every day | ORAL | Status: DC
  Administered 2023-03-26 – 2023-03-31 (×6): 1000 ug via ORAL
  Filled 2023-03-26 (×10): qty 1

## 2023-03-26 NOTE — Group Note (Signed)
Date:  03/26/2023 Time:  9:07 PM  Group Topic/Focus:  Wrap-Up Group:   The focus of this group is to help patients review their daily goal of treatment and discuss progress on daily workbooks.    Participation Level:  Active  Participation Quality:  Appropriate and Attentive  Affect:  Appropriate  Cognitive:  Alert and Appropriate  Insight: Appropriate and Good  Engagement in Group:  Engaged  Modes of Intervention:  Discussion and Education  Additional Comments:  Pt attended and participated in wrap up group this evening. Pt stated that their day was 7/10. Pt completed their goal, which was to "get closer to God". Pt reads the bible, prays and writes sermons to achieve their goal.   Chrisandra Netters 03/26/2023, 9:07 PM

## 2023-03-26 NOTE — Progress Notes (Signed)
   03/26/23 1400  Psych Admission Type (Psych Patients Only)  Admission Status Voluntary  Psychosocial Assessment  Patient Complaints Anxiety  Eye Contact Fair  Facial Expression Animated  Affect Appropriate to circumstance  Speech Logical/coherent  Interaction Assertive  Motor Activity Fidgety  Appearance/Hygiene Unremarkable  Behavior Characteristics Appropriate to situation  Mood Anxious  Thought Process  Coherency WDL  Content WDL  Delusions None reported or observed  Perception WDL  Hallucination None reported or observed  Judgment Limited  Confusion None  Danger to Self  Current suicidal ideation? Denies  Danger to Others  Danger to Others None reported or observed

## 2023-03-26 NOTE — Progress Notes (Signed)
   03/26/23 0530  15 Minute Checks  Location Bedroom  Visual Appearance Calm  Behavior Sleeping  Sleep (Behavioral Health Patients Only)  Calculate sleep? (Click Yes once per 24 hr at 0600 safety check) Yes  Documented sleep last 24 hours 6

## 2023-03-26 NOTE — Progress Notes (Signed)
[facility] MD Progress Note  03/26/2023 7:23 AM Holly [name]  MRN:  [id number]  Principal Problem: Schizoaffective disorder, bipolar type (HCC) Diagnosis: Principal Problem:   Schizoaffective disorder, bipolar type (HCC) Active Problems:   Mild intellectual disability  Reason for Admission:  Holly [name] is a 27 y.o. female  with a past psychiatric history of ADHD, depression, intellectual disability, schizoaffective disorder, PTSD. Patient initially arrived to [facility] on 11/14 for self-harm with glass and suicidal ideations, and admitted to [facility] voluntarily on 11/16 for acute safety concerns. PPHx is significant for ADHD, depression, intellectual disability, schizoaffective disorder, PTSD, and history of multiple Suicide Attempts, Self Injurious Behavior, and 7 Prior Psychiatric Hospitalizations (2000, 2007x2, 2008, 2015, 2016, 2017x2). PMHx is significant for DM, hypothyroidism.   (admitted on 03/24/2023, total  LOS: 2 days )  Chart review: Overnight Events VSS. MAR was reviewed and patient was compliant with medications.  Patient received PRN atarax x1, trazodone x1. Vitamin B12 155. Folate wnl. Lipid panel wnl. Li level subtherapeutic. A1c 5.0. TSH wnl.   Pertinent information discussed during bed progression:  Case was discussed in the multidisciplinary team. Report patient slept 6 hours. Reports patient reporting some lightheadedness this AM. Reports held linzess due to patient reporting loose stools.   Information Obtained Today During Patient Interview: Patient evaluated on the unit, seen laying down in her bed. Reports no issues with sleep . Reports appetite no issues. States mood is overall better today. Reports some lightheadedness with receiving additional atarax overnight. Otherwise denies other physical complaints. Discussed labs. Discussed starting vitamin B12. She reports compliance with lithium prior to admission, given her subtherapeutic level, discussed increasing nighttime  lithium. Denies any increased EPS from decreasing amantadine.   She denies SI/HI/AVH.   Past Psychiatric History:  Current Psychiatrist: Tamela Oddi Current Therapist: no therapist for 1.5 years Previous Psychiatric Diagnoses: schizoaffective disorder bipolar type, intellectual disability, ADHD, PTSD Current psychiatric medications:            Amantadine 100mg  BID, celexa 20 daily, lithobid 900 at bedtime, lybalvi 10-10 at bedtime, trazodone 150 at bedtime  Psychiatric medication history/compliance: -prozac 20, celexa 20, amantadine 100 BID, abilify 10, lithium 900 at bedtime, trazodone 300, cogentin 1, doxepin 25, zyprexa 15 [facility] 08/2015: Schizoaffective disorder bipolar type  Meds:  prozac 20, lithium 450 CR BID, zyprexa 15  Psychiatric Hospitalization hx: 7 Prior Psychiatric Hospitalizations (2000, 2007x2, 2008, 2015, 2016, 2017x2) Psychotherapy hx: history of seeing therapist Neuromodulation history: none History of suicide (obtained from HPI): multiple suicide attempts, most recently 2017 after ingesting 6-8 AA batteries as a suicidal gesture History of homicide or aggression (obtained in HPI): none  Family Psychiatric History:  Mother with alcoholism   Social History:  Living Situation: Was living in ALF with princess. Has legal guardian.  Education: history of mild intellectual disability Occupational hx: reports she gets disability check from the state, has EBT  Marital Status: single  Children: denies  Legal: denies   Past Medical History:  Past Medical History:  Diagnosis Date   ADHD (attention deficit hyperactivity disorder)    Bipolar 1 disorder (HCC)    Depression    Diabetes mellitus    Gluten free diet    Hypothyroidism    Mood disorder (HCC)    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   Obesity    PTSD (post-traumatic stress disorder)    Family History:  Family History  Adopted: Yes    Current Medications: Current Facility-Administered Medications  Medication  Dose  Route Frequency Provider Last Rate Last Admin   acetaminophen (TYLENOL) tablet 650 mg  650 mg Oral Q6H PRN Motley-Mangrum, Jadeka A, PMHNP       alum & mag hydroxide-simeth (MAALOX/MYLANTA) 200-200-20 MG/5ML suspension 30 mL  30 mL Oral Q4H PRN Motley-Mangrum, Jadeka A, PMHNP       amantadine (SYMMETREL) capsule 100 mg  100 mg Oral Daily Karie Fetch, MD       apixaban Everlene Balls) tablet 5 mg  5 mg Oral Q12H Otho Bellows, RPH   5 mg at 03/25/23 2057   citalopram (CELEXA) tablet 20 mg  20 mg Oral Daily Motley-Mangrum, Jadeka A, PMHNP   20 mg at 03/25/23 0744   cyanocobalamin (VITAMIN B12) tablet 1,000 mcg  1,000 mcg Oral Daily Karie Fetch, MD       haloperidol (HALDOL) tablet 5 mg  5 mg Oral TID PRN Motley-Mangrum, Jadeka A, PMHNP       And   diphenhydrAMINE (BENADRYL) capsule 50 mg  50 mg Oral TID PRN Motley-Mangrum, Ezra Sites, PMHNP       hydrocerin (EUCERIN) cream   Topical Daily PRN Karie Fetch, MD       hydrOXYzine (ATARAX) tablet 25 mg  25 mg Oral TID PRN Motley-Mangrum, Jadeka A, PMHNP   25 mg at 03/25/23 2310   influenza vac split trivalent PF (FLULAVAL) injection 0.5 mL  0.5 mL Intramuscular Tomorrow-1000 Attiah, Nadir, MD       levothyroxine (SYNTHROID) tablet 100 mcg  100 mcg Oral Q0600 Motley-Mangrum, Jadeka A, PMHNP   100 mcg at 03/26/23 0620   linaclotide (LINZESS) capsule 290 mcg  290 mcg Oral Daily Motley-Mangrum, Jadeka A, PMHNP   290 mcg at 03/25/23 0744   lithium carbonate (ESKALITH) ER tablet 900 mg  900 mg Oral QHS Motley-Mangrum, Jadeka A, PMHNP   900 mg at 03/25/23 2057   magnesium hydroxide (MILK OF MAGNESIA) suspension 30 mL  30 mL Oral Daily PRN Motley-Mangrum, Jadeka A, PMHNP       metFORMIN (GLUCOPHAGE) tablet 1,000 mg  1,000 mg Oral Q supper Motley-Mangrum, Jadeka A, PMHNP   1,000 mg at 03/25/23 1731   metFORMIN (GLUCOPHAGE) tablet 500 mg  500 mg Oral Daily Motley-Mangrum, Jadeka A, PMHNP   500 mg at 03/25/23 0744   OLANZapine (ZYPREXA) tablet 10 mg   10 mg Oral QHS Karie Fetch, MD   10 mg at 03/25/23 2057   traZODone (DESYREL) tablet 150 mg  150 mg Oral QHS Motley-Mangrum, Jadeka A, PMHNP   150 mg at 03/25/23 2056   traZODone (DESYREL) tablet 50 mg  50 mg Oral QHS PRN Motley-Mangrum, Jadeka A, PMHNP   50 mg at 03/25/23 2310    Lab Results:  Results for orders placed or performed during the hospital encounter of 03/24/23 (from the past 48 hour(s))  Folate     Status: None   Collection Time: 03/25/23  6:35 PM  Result Value Ref Range   Folate 9.1 >5.9 ng/mL    Comment: Performed at [facility], [address], [place], [place] [address]  Hemoglobin A1c     Status: None   Collection Time: 03/25/23  6:35 PM  Result Value Ref Range   Hgb A1c MFr Bld 5.0 4.8 - 5.6 %    Comment: (NOTE) Pre diabetes:          5.7%-6.4%  Diabetes:              >6.4%  Glycemic control for   <7.0%  adults with diabetes    Mean Plasma Glucose 96.8 mg/dL    Comment: Performed at [facility], [address] [place], [place] [address]  Lipid panel     Status: None   Collection Time: 03/25/23  6:35 PM  Result Value Ref Range   Cholesterol 122 0 - 200 mg/dL   Triglycerides 409 <811 mg/dL   HDL 47 >91 mg/dL   Total CHOL/HDL Ratio 2.6 RATIO   VLDL 22 0 - 40 mg/dL   LDL Cholesterol 53 0 - 99 mg/dL    Comment:        Total Cholesterol/HDL:CHD Risk Coronary Heart Disease Risk Table                     Men   Women  1/2 Average Risk   3.4   3.3  Average Risk       5.0   4.4  2 X Average Risk   9.6   7.1  3 X Average Risk  23.4   11.0        Use the calculated Patient Ratio above and the CHD Risk Table to determine the patient's CHD Risk.        ATP III CLASSIFICATION (LDL):  <100     mg/dL   Optimal  478-295  mg/dL   Near or Above                    Optimal  130-159  mg/dL   Borderline  621-308  mg/dL   High  >657     mg/dL   Very High Performed at [facility], [address] [place], [place]  [address]   Vitamin B12     Status: Abnormal   Collection Time: 03/25/23  6:35 PM  Result Value Ref Range   Vitamin B-12 155 (L) 180 - 914 pg/mL    Comment: (NOTE) This assay is not validated for testing neonatal or myeloproliferative syndrome specimens for Vitamin B12 levels. Performed at [facility], [address], [place], [place] [address]   VITAMIN D 25 Hydroxy (Vit-D Deficiency, Fractures)     Status: None   Collection Time: 03/25/23  6:35 PM  Result Value Ref Range   Vit D, 25-Hydroxy 40.42 30 - 100 ng/mL    Comment: (NOTE) Vitamin D deficiency has been defined by the Institute of Medicine  and an Endocrine Society practice guideline as a level of serum 25-OH  vitamin D less than 20 ng/mL (1,2). The Endocrine Society went on to  further define vitamin D insufficiency as a level between 21 and 29  ng/mL (2).  1. IOM (Institute of Medicine). 2010. Dietary reference intakes for  calcium and D. [place]: [employer]. 2. Holick MF, Binkley Pringle, Bischoff-Ferrari HA, et al. Evaluation,  treatment, and prevention of vitamin D deficiency: an Endocrine  Society clinical practice guideline, JCEM. 2011 Jul; 96(7): 1911-30.  Performed at [facility], [address], [place], [place] [address]   Lithium level     Status: Abnormal   Collection Time: 03/25/23  6:35 PM  Result Value Ref Range   Lithium Lvl 0.36 (L) 0.60 - 1.20 mmol/L    Comment: Performed at [facility], [address], [place], [place] [address]  TSH     Status: None   Collection Time: 03/25/23  6:35 PM  Result Value Ref Range   TSH 3.105 0.350 -  4.500 uIU/mL    Comment: Performed by a 3rd Generation assay with a functional sensitivity of <=0.01 uIU/mL. Performed at [facility], [address] [place], [place] [address]     Blood Alcohol level:  Lab Results  Component Value Date   [facility] <10 03/23/2023   ETH <10 02/23/2023     Metabolic Labs: Lab Results  Component Value Date   HGBA1C 5.0 03/25/2023   MPG 96.8 03/25/2023   MPG 102.54 09/23/2022   Lab Results  Component Value Date   PROLACTIN 48.3 (H) 08/21/2015   Lab Results  Component Value Date   CHOL 122 03/25/2023   TRIG 112 03/25/2023   HDL 47 03/25/2023   CHOLHDL 2.6 03/25/2023   VLDL 22 03/25/2023   LDLCALC 53 03/25/2023   LDLCALC 44 08/21/2015    Sleep: Fair  Physical Findings:  Psychiatric Specialty Exam:  Presentation  General Appearance: Casual, laying in bed  Eye Contact: Fair  Speech:Clear and Coherent  Speech Volume:Normal  Handedness:Right   Mood and Affect  Mood: Overall better  Affect:Congruent   Thought Process  Thought Processes:Coherent  Descriptions of Associations:Intact  Orientation:Full (Time, Place and Person)  Thought Content:Logical  History of Schizophrenia/Schizoaffective disorder:Yes  Duration of Psychotic Symptoms:Greater than six months  Hallucinations: None Ideas of Reference:None  Suicidal Thoughts: None Homicidal Thoughts: None  Sensorium  Memory:Immediate Fair; Recent Fair; Remote Fair  Judgment:Poor  Insight:Poor   Executive Functions  Concentration:Fair  Attention Span:Fair  Recall:Fair  Fund of Knowledge:Fair  Language:Fair   Psychomotor Activity  Psychomotor Activity: Decreased  Assets  Assets:Desire for Improvement; Social Support; Leisure Time   Sleep  Sleep:No data recorded   Physical Exam Constitutional:      Appearance: the patient is not toxic-appearing.  Pulmonary:     Effort: Pulmonary effort is normal.  Neurological:     General: No focal deficit present.     Mental Status: the patient is alert and oriented to person, place, and time.   Review of Systems  Respiratory:  Negative for shortness of breath.   Cardiovascular:  Negative for chest pain.  Gastrointestinal:  Negative for abdominal pain, constipation, diarrhea, nausea and  vomiting.  Neurological:  Negative for headaches. Reporting lightheadedness this AM.   Blood pressure 106/77, pulse 100, temperature 97.9 F (36.6 C), temperature source Oral, resp. rate 16, height 5\' 3"  (1.6 m), weight 99.8 kg, SpO2 100%. Body mass index is 38.97 kg/m.  Treatment Plan Summary: Daily contact with patient to assess and evaluate symptoms and progress in treatment, Medication management, and Plan    ASSESSMENT: Holly [name] is a 27 y.o. female  with a past psychiatric history of ADHD, depression, intellectual disability, schizoaffective disorder, PTSD. Patient initially arrived to [facility] on 11/14 for self-harm with glass and suicidal ideations, and admitted to [facility] voluntarily on 11/16 for acute safety concerns. PPHx is significant for ADHD, depression, intellectual disability, schizoaffective disorder, PTSD, and history of multiple Suicide Attempts, Self Injurious Behavior, and 7 Prior Psychiatric Hospitalizations (2000, 2007x2, 2008, 2015, 2016, 2017x2). PMHx is significant for DM, hypothyroidism.   No side effects of increased EPS from decreasing amantadine, will plan to d/c today. Per chart review, patient has been on zyprexa since at least 2022. Given subtherapeutic lithium level and reported compliance from patient, will increase lithium dose tonight. Continue to encourage fluids given reported lightheadedness. Vit B12 deficient, will start supplement.  Diagnoses / Active Problems: Schizoaffective disorder bipolar type, current episode depressed  History of mild intellectual  disability   PLAN: Safety and Monitoring:   --  VOLUNTARY  admission to inpatient psychiatric unit for safety, stabilization and treatment            -- Daily contact with patient to assess and evaluate symptoms and progress in treatment            -- Patient's case to be discussed in multi-disciplinary team meeting            -- Observation Level : q15 minute checks            -- Vital signs:  q12  hours            -- Precautions: suicide, elopement, and assault  2. Psychiatric Diagnoses and Treatment:  --d/c home amantadine -- continue home celexa 20mg  daily for mood stability --increase Li ER 1200mg  daily for mood stability Li level 0.36 11/16 Repeat Li level 11/20 --continue home zyprexa 10mg  at bedtime for mood stability  -- continue home trazodone 150mg  at bedtime for insomnia  -- The risks/benefits/side-effects/alternatives to this medication were discussed in detail with the patient and time was given for questions. The patient consents to medication trial.              -- Metabolic profile and EKG monitoring obtained while on an atypical antipsychotic  BMI: 38.97  TSH:  Lab Results  Component Value Date   TSH 3.105 03/25/2023   Lipid Panel:  Lab Results  Component Value Date   CHOL 122 03/25/2023   HDL 47 03/25/2023   LDLCALC 53 03/25/2023   TRIG 112 03/25/2023   CHOLHDL 2.6 03/25/2023   HbgA1c: Lab Results  Component Value Date   HGBA1C 5.0 03/25/2023   QTc: 426 11/14              -- Encouraged patient to participate in unit milieu and in scheduled group therapies   -- Short Term Goals: Ability to identify changes in lifestyle to reduce recurrence of condition will improve, Ability to verbalize feelings will improve, Ability to disclose and discuss suicidal ideas, Ability to demonstrate self-control will improve, Ability to identify and develop effective coping behaviors will improve, and Ability to identify triggers associated with substance abuse/mental health issues will improve   -- Long Term Goals: Improvement in symptoms so as ready for discharge   Other PRNS:   -- start acetaminophen 650 mg every 6 hours as needed for mild to moderate pain, fever, and headaches              -- start hydroxyzine 25 mg three times a day as needed for anxiety   -- start aluminum-magnesium hydroxide + simethicone 30 mL every 4 hours as needed for heartburn            -- As  needed agitation protocol in-place -- As needed agitation protocol in-place  Labs notable for hypokalemia  (K 3.2), Hgb 11.5 Cr 0.84, AST 15, ALT 9 Tylenol <10 UDS - Alc <10 QTc 426   3. Medical Issues Being Addressed:   #Hypokalemia s/p repletion    #History of diabetes A1c 5.0 Continue home metformin 500 AM, 1000mg  at bedtime    #History of PE  - continue eliquis 5mg  Q12H    #History of hypothyroidism -TSH wnl  -continue home synthroid    #Chronic constipation -continue home linzess   #Vit B12 deficiency -start vit B12 1000U daily  4. Discharge Planning:   -- Social work and case management to assist with  discharge planning and identification of hospital follow-up needs prior to discharge  -- Estimated LOS: 5-7 days  -- Discharge Concerns: Need to establish a safety plan; Medication compliance and effectiveness  -- Discharge Goals: Return home with outpatient referrals for mental health follow-up including medication management/psychotherapy   I certify that inpatient services furnished can reasonably be expected to improve the patient's condition.   This note was created using a voice recognition software as a result there may be grammatical errors inadvertently enclosed that do not reflect the nature of this encounter. Every attempt is made to correct such errors.   Dr. Karie Fetch, MD PGY-2, Psychiatry Residency  11/17/20247:23 AM

## 2023-03-26 NOTE — [facility] Group Notes (Signed)
Type of Therapy and Topic:  Group Therapy: Gratitude  Participation Level:  Did Not Attend   Description of Group:   In this group, the patient participates in deep breathing exercise to help quiet down the room and memory game to get to know one another. Patients shared and discussed the importance of acknowledging the elements in their lives for which they are grateful and how this can positively impact their mood.  The group discussed how bringing the positive elements of their lives to the forefront of their minds can help bring positive impact to the physical and mental health.  An exercise was done as a group in which a list was made of gratitude items in order to encourage participants to consider other potential positives in their lives.  Therapeutic Goals: Patients will identify one or more item for which they are grateful in each of 6 categories:  people, experiences, things, places, skills, and other. Patients will discuss how it is possible to seek out gratitude in even bad situations. Patients will explore other possible items of gratitude that they could remember.   Summary of Patient Progress:  NA  Therapeutic Modalities:   Solution-Focused Therapy Activity

## 2023-03-26 NOTE — Plan of Care (Signed)
  Problem: Education: Goal: Knowledge of [facility] Education information/materials will improve Outcome: Progressing Goal: Emotional status will improve Outcome: Progressing Goal: Mental status will improve Outcome: Progressing Goal: Verbalization of understanding the information provided will improve Outcome: Progressing   Problem: Activity: Goal: Interest or engagement in activities will improve Outcome: Progressing Goal: Sleeping patterns will improve Outcome: Progressing   Problem: Coping: Goal: Ability to verbalize frustrations and anger appropriately will improve Outcome: Progressing Goal: Ability to demonstrate self-control will improve Outcome: Progressing   Problem: Health Behavior/Discharge Planning: Goal: Identification of resources available to assist in meeting health care needs will improve Outcome: Progressing Goal: Compliance with treatment plan for underlying cause of condition will improve Outcome: Progressing   Problem: Physical Regulation: Goal: Ability to maintain clinical measurements within normal limits will improve Outcome: Progressing   Problem: Safety: Goal: Periods of time without injury will increase Outcome: Progressing   Problem: Activity: Goal: Will identify at least one activity in which they can participate Outcome: Progressing   Problem: Coping: Goal: Ability to identify and develop effective coping behavior will improve Outcome: Progressing Goal: Ability to interact with others will improve Outcome: Progressing Goal: Demonstration of participation in decision-making regarding own care will improve Outcome: Progressing Goal: Ability to use eye contact when communicating with others will improve Outcome: Progressing   Problem: Health Behavior/Discharge Planning: Goal: Identification of resources available to assist in meeting health care needs will improve Outcome: Progressing   Problem: Self-Concept: Goal: Will  verbalize positive feelings about self Outcome: Progressing   Problem: Education: Goal: Ability to make informed decisions regarding treatment will improve Outcome: Progressing   Problem: Coping: Goal: Coping ability will improve Outcome: Progressing   Problem: Health Behavior/Discharge Planning: Goal: Identification of resources available to assist in meeting health care needs will improve Outcome: Progressing   Problem: Medication: Goal: Compliance with prescribed medication regimen will improve Outcome: Progressing   Problem: Self-Concept: Goal: Ability to disclose and discuss suicidal ideas will improve Outcome: Progressing Goal: Will verbalize positive feelings about self Outcome: Progressing Note: Patient is on track. Patient will maintain adherence

## 2023-03-26 NOTE — [facility] Suicide Risk Assessment (Signed)
[facility] INPATIENT:  Family/Significant Other Suicide Prevention Education  Suicide Prevention Education:  Contact Attempts: [name] (Legal Guardian) [phone], (name of family member/significant other) has been identified by the patient as the family member/significant other with whom the patient will be residing, and identified as the person(s) who will aid the patient in the event of a mental health crisis.  With written consent from the patient, two attempts were made to provide suicide prevention education, prior to and/or following the patient's discharge.  We were unsuccessful in providing suicide prevention education.  A suicide education pamphlet was given to the patient to share with family/significant other.  Date and time of first attempt:03/26/2023 /3:26pm - voicemail could not be left Date and time of second attempt:  CSW team will continue efforts to reach  Holly [name] 03/26/2023, 3:27 PM

## 2023-03-26 NOTE — Progress Notes (Signed)
   03/26/23 2354  Psych Admission Type (Psych Patients Only)  Admission Status Voluntary/72 hour document signed  Date 72 hour document signed  03/26/23  Time 72 hour document signed  1950  Provider Notified (First and Last Name) (see details for LINK to note) Will be notified in AM  Psychosocial Assessment  Patient Complaints Anxiety  Eye Contact Fair  Facial Expression Animated;Anxious  Affect Appropriate to circumstance  Speech Logical/coherent  Interaction Assertive  Motor Activity Fidgety  Appearance/Hygiene Unremarkable  Behavior Characteristics Appropriate to situation  Mood Anxious;Pleasant  Thought Process  Coherency WDL  Content WDL  Delusions None reported or observed  Perception WDL  Hallucination None reported or observed  Judgment Limited  Confusion None  Danger to Self  Current suicidal ideation? Denies  Self-Injurious Behavior No self-injurious ideation or behavior indicators observed or expressed   Agreement Not to Harm Self Yes  Description of Agreement verbal  Danger to Others  Danger to Others None reported or observed

## 2023-03-26 NOTE — [facility] Group Notes (Signed)
[facility] Group Notes:  (Nursing/MHT/Case Management/Adjunct)  Date:  03/26/2023  Time: 1330  Type of Therapy:  Psychoeducational Skills  Participation Level:  Active  Participation Quality:  Appropriate  Affect:  Appropriate  Cognitive:  Appropriate  Insight:  Appropriate  Engagement in Group:  Engaged  Modes of Intervention:  Discussion, Education, and Exploration  Summary of Progress/Problems: Psychoeducational group in which patients were asked to identify patterns of negative thinking and how this can impact mental health. Pt attended and was appropriate.   Malva Limes 03/26/2023, 5:36 PM

## 2023-03-27 ENCOUNTER — Encounter (HOSPITAL_COMMUNITY): Payer: Self-pay

## 2023-03-27 NOTE — BH IP Treatment Plan (Signed)
Interdisciplinary Treatment and Diagnostic Plan Update  03/27/2023 Time of Session: 10:50AM AHNA SECREST MRN: [id number]  Principal Diagnosis: Schizoaffective disorder, bipolar type (HCC)  Secondary Diagnoses: Principal Problem:   Schizoaffective disorder, bipolar type (HCC) Active Problems:   Mild intellectual disability   Current Medications:  Current Facility-Administered Medications  Medication Dose Route Frequency Provider Last Rate Last Admin   acetaminophen (TYLENOL) tablet 650 mg  650 mg Oral Q6H PRN Motley-Mangrum, Jadeka A, PMHNP       alum & mag hydroxide-simeth (MAALOX/MYLANTA) 200-200-20 MG/5ML suspension 30 mL  30 mL Oral Q4H PRN Motley-Mangrum, Jadeka A, PMHNP       apixaban (ELIQUIS) tablet 5 mg  5 mg Oral Q12H Green, Terri L, RPH   5 mg at 03/27/23 0806   citalopram (CELEXA) tablet 20 mg  20 mg Oral Daily Motley-Mangrum, Jadeka A, PMHNP   20 mg at 03/27/23 4034   cyanocobalamin (VITAMIN B12) tablet 1,000 mcg  1,000 mcg Oral Daily Karie Fetch, MD   1,000 mcg at 03/27/23 7425   haloperidol (HALDOL) tablet 5 mg  5 mg Oral TID PRN Motley-Mangrum, Jadeka A, PMHNP       And   diphenhydrAMINE (BENADRYL) capsule 50 mg  50 mg Oral TID PRN Motley-Mangrum, Ezra Sites, PMHNP       hydrocerin (EUCERIN) cream   Topical Daily PRN Karie Fetch, MD       hydrOXYzine (ATARAX) tablet 25 mg  25 mg Oral TID PRN Motley-Mangrum, Jadeka A, PMHNP   25 mg at 03/26/23 2255   influenza vac split trivalent PF (FLULAVAL) injection 0.5 mL  0.5 mL Intramuscular Tomorrow-1000 Abbott Pao, Nadir, MD       levothyroxine (SYNTHROID) tablet 100 mcg  100 mcg Oral Q0600 Motley-Mangrum, Jadeka A, PMHNP   100 mcg at 03/27/23 0605   linaclotide (LINZESS) capsule 290 mcg  290 mcg Oral Daily Motley-Mangrum, Jadeka A, PMHNP   290 mcg at 03/25/23 0744   lithium carbonate (LITHOBID) ER tablet 1,200 mg  1,200 mg Oral QHS Karie Fetch, MD   1,200 mg at 03/26/23 2131   magnesium hydroxide (MILK OF MAGNESIA)  suspension 30 mL  30 mL Oral Daily PRN Motley-Mangrum, Jadeka A, PMHNP       metFORMIN (GLUCOPHAGE) tablet 1,000 mg  1,000 mg Oral Q supper Motley-Mangrum, Jadeka A, PMHNP   1,000 mg at 03/26/23 1639   metFORMIN (GLUCOPHAGE) tablet 500 mg  500 mg Oral Daily Motley-Mangrum, Jadeka A, PMHNP   500 mg at 03/27/23 0806   OLANZapine (ZYPREXA) tablet 10 mg  10 mg Oral QHS Karie Fetch, MD   10 mg at 03/26/23 2131   traZODone (DESYREL) tablet 150 mg  150 mg Oral QHS Motley-Mangrum, Jadeka A, PMHNP   150 mg at 03/26/23 2131   traZODone (DESYREL) tablet 50 mg  50 mg Oral QHS PRN Motley-Mangrum, Jadeka A, PMHNP   50 mg at 03/26/23 2255   PTA Medications: Medications Prior to Admission  Medication Sig Dispense Refill Last Dose   amantadine (SYMMETREL) 100 MG capsule Take 1 capsule (100 mg total) by mouth 2 (two) times daily for 7 days. Takes 1 at 8AM and 1 at 5PM (Patient taking differently: Take 100 mg by mouth See admin instructions. Give 100 mg (1 capsule) twice daily at 0800 and 1700.) 14 capsule 0 03/23/2023   apixaban (ELIQUIS) 5 MG TABS tablet Take 5 mg by mouth 2 (two) times daily.   03/23/2023   citalopram (CELEXA) 20 MG tablet Take 20 mg by  mouth daily.   03/23/2023   levothyroxine (SYNTHROID) 100 MCG tablet Take 1 tablet (100 mcg total) by mouth daily before breakfast for 7 days. For low thyroid function (Patient taking differently: Take 100 mcg by mouth daily.) 7 tablet 0 03/23/2023   linaclotide (LINZESS) 290 MCG CAPS capsule Take 290 mcg by mouth daily as needed (GI upset).   03/23/2023   lithium carbonate (LITHOBID) 300 MG ER tablet Take 3 tablets (900 mg total) by mouth at bedtime for 7 days. For mood stabilization (Patient taking differently: Take 300 mg by mouth at bedtime.) 21 tablet 0 03/23/2023   metFORMIN (GLUCOPHAGE) 1000 MG tablet Take 1,000 mg by mouth at bedtime.   03/23/2023   metFORMIN (GLUCOPHAGE) 500 MG tablet Take 500 mg by mouth daily.   03/23/2023   OLANZapine-Samidorphan  (LYBALVI) 10-10 MG TABS Take 1 tablet by mouth at bedtime.   03/23/2023   omeprazole (PRILOSEC) 20 MG capsule Take 20 mg by mouth daily.   03/23/2023   traZODone (DESYREL) 150 MG tablet Take 150 mg by mouth at bedtime.   03/23/2023   acetaminophen (TYLENOL) 325 MG tablet Take 2 tablets (650 mg total) by mouth every 4 (four) hours as needed for fever or mild pain.       Patient Stressors:    Patient Strengths:    Treatment Modalities: Medication Management, Group therapy, Case management,  1 to 1 session with clinician, Psychoeducation, Recreational therapy.   Physician Treatment Plan for Primary Diagnosis: Schizoaffective disorder, bipolar type (HCC) Long Term Goal(s): Improvement in symptoms so as ready for discharge   Short Term Goals: Ability to identify changes in lifestyle to reduce recurrence of condition will improve Ability to verbalize feelings will improve Ability to disclose and discuss suicidal ideas Ability to demonstrate self-control will improve Ability to identify and develop effective coping behaviors will improve Ability to identify triggers associated with substance abuse/mental health issues will improve  Medication Management: Evaluate patient's response, side effects, and tolerance of medication regimen.  Therapeutic Interventions: 1 to 1 sessions, Unit Group sessions and Medication administration.  Evaluation of Outcomes: Not Progressing  Physician Treatment Plan for Secondary Diagnosis: Principal Problem:   Schizoaffective disorder, bipolar type (HCC) Active Problems:   Mild intellectual disability  Long Term Goal(s): Improvement in symptoms so as ready for discharge   Short Term Goals: Ability to identify changes in lifestyle to reduce recurrence of condition will improve Ability to verbalize feelings will improve Ability to disclose and discuss suicidal ideas Ability to demonstrate self-control will improve Ability to identify and develop effective  coping behaviors will improve Ability to identify triggers associated with substance abuse/mental health issues will improve     Medication Management: Evaluate patient's response, side effects, and tolerance of medication regimen.  Therapeutic Interventions: 1 to 1 sessions, Unit Group sessions and Medication administration.  Evaluation of Outcomes: Not Progressing   RN Treatment Plan for Primary Diagnosis: Schizoaffective disorder, bipolar type (HCC) Long Term Goal(s): Knowledge of disease and therapeutic regimen to maintain health will improve  Short Term Goals: Ability to remain free from injury will improve, Ability to verbalize frustration and anger appropriately will improve, Ability to demonstrate self-control, Ability to participate in decision making will improve, Ability to verbalize feelings will improve, Ability to disclose and discuss suicidal ideas, Ability to identify and develop effective coping behaviors will improve, and Compliance with prescribed medications will improve  Medication Management: RN will administer medications as ordered by provider, will assess and evaluate patient's response and  provide education to patient for prescribed medication. RN will report any adverse and/or side effects to prescribing provider.  Therapeutic Interventions: 1 on 1 counseling sessions, Psychoeducation, Medication administration, Evaluate responses to treatment, Monitor vital signs and CBGs as ordered, Perform/monitor CIWA, COWS, AIMS and Fall Risk screenings as ordered, Perform wound care treatments as ordered.  Evaluation of Outcomes: Not Progressing   LCSW Treatment Plan for Primary Diagnosis: Schizoaffective disorder, bipolar type (HCC) Long Term Goal(s): Safe transition to appropriate next level of care at discharge, Engage patient in therapeutic group addressing interpersonal concerns.  Short Term Goals: Engage patient in aftercare planning with referrals and resources,  Increase social support, Increase ability to appropriately verbalize feelings, Increase emotional regulation, Facilitate acceptance of mental health diagnosis and concerns, Facilitate patient progression through stages of change regarding substance use diagnoses and concerns, Identify triggers associated with mental health/substance abuse issues, and Increase skills for wellness and recovery  Therapeutic Interventions: Assess for all discharge needs, 1 to 1 time with Social worker, Explore available resources and support systems, Assess for adequacy in community support network, Educate family and significant other(s) on suicide prevention, Complete Psychosocial Assessment, Interpersonal group therapy.  Evaluation of Outcomes: Not Progressing   Progress in Treatment: Attending groups: Yes. Participating in groups: Yes. Taking medication as prescribed: Yes. Toleration medication: Yes. Family/Significant other contact made: No, will contact:  [name] (Legal Guardian) [phone] Patient understands diagnosis: Yes. Discussing patient identified problems/goals with staff: Yes. Medical problems stabilized or resolved: Yes. Denies suicidal/homicidal ideation: Yes. Issues/concerns per patient self-inventory: No.  New problem(s) identified: No, Describe:  none reported  New Short Term/Long Term Goal(s): medication stabilization, elimination of SI thoughts, development of comprehensive mental wellness plan.    Patient Goals:  "Not criticize myself, get closer to God of course, and no cutting"  Discharge Plan or Barriers: Patient recently admitted. CSW will continue to follow and assess for appropriate referrals and possible discharge planning.    Reason for Continuation of Hospitalization: Medication stabilization Suicidal ideation Other; describe Schizoaffective disorder, bipolar type symptoms  Estimated Length of Stay: 5-7 days  Last 3 [place] Suicide Severity Risk Score: Flowsheet  Row [facility] (Current) [facility] from 03/23/2023 in [facility] Department [facility] ED from 02/23/2023 in [facility] Emergency Department at [facility]  C-SSRS RISK CATEGORY High Risk High Risk High Risk       Last [facility] 2/9 Scores:     No data to display          Scribe for Treatment Team: Kathi Der, LCSWA 03/27/2023 1:25 PM

## 2023-03-27 NOTE — Progress Notes (Signed)
   03/27/23 2000  Psych Admission Type (Psych Patients Only)  Admission Status Voluntary/72 hour document signed  Date 72 hour document signed  03/26/23  Psychosocial Assessment  Patient Complaints None  Eye Contact Brief  Facial Expression Other (Comment) (Normal)  Affect Appropriate to circumstance  Speech Logical/coherent  Interaction Assertive  Motor Activity Other (Comment) (WNL)  Appearance/Hygiene Unremarkable  Behavior Characteristics Appropriate to situation  Mood Euthymic  Thought Process  Coherency WDL  Content WDL  Delusions None reported or observed  Perception WDL  Hallucination None reported or observed  Judgment Limited  Confusion None  Danger to Self  Current suicidal ideation? Denies  Self-Injurious Behavior No self-injurious ideation or behavior indicators observed or expressed   Agreement Not to Harm Self Yes  Description of Agreement Verbal Contract  Danger to Others  Danger to Others None reported or observed

## 2023-03-27 NOTE — Group Note (Signed)
Recreation Therapy Group Note   Group Topic:Stress Management  Group Date: 03/27/2023 Start Time: 0940 End Time: 0955 Facilitators: Tristyn Pharris-McCall, LRT,CTRS Location: [address] Dayroom   Group Topic: Stress Management   Goal Area(s) Addresses:  Patient will actively participate in stress management techniques presented during session.  Patient will successfully identify benefit of practicing stress management post d/c.   Behavioral Response: Appropriate  Intervention: Relaxation exercise with ambient sound and script   Group Description: Guided Imagery. LRT provided education, instruction, and demonstration on practice of visualization via guided imagery. Patient was asked to participate in the technique introduced during session. LRT debriefed including topics of mindfulness, stress management and specific scenarios each patient could use these techniques. Patients were given suggestions of ways to access scripts post d/c and encouraged to explore Youtube and other apps available on smartphones, tablets, and computers.  Education: Stress Management, Discharge Planning.   Education Outcome: Acknowledges education   Affect/Mood: Appropriate   Participation Level: None   Participation Quality: Independent   Behavior: Appropriate   Speech/Thought Process: None   Insight: None   Judgement: None   Modes of Intervention: Script   Patient Response to Interventions:  Disengaged   Education Outcome:  In group clarification offered    Clinical Observations/Individualized Feedback: Pt came halfway through group. Pt sat just under the television and was coloring as the rest of the group participated in technique.   Plan: Continue to engage patient in RT group sessions 2-3x/week.   Tevon Berhane-McCall, LRT,CTRS 03/27/2023 1:28 PM

## 2023-03-27 NOTE — Plan of Care (Signed)
  Problem: Education: Goal: Knowledge of [place] [facility] Education information/materials will improve Outcome: Progressing Goal: Emotional status will improve Outcome: Progressing Goal: Mental status will improve Outcome: Progressing   

## 2023-03-27 NOTE — Progress Notes (Signed)
   03/27/23 0533  15 Minute Checks  Location Bedroom  Visual Appearance Calm  Behavior Sleeping  Sleep (Behavioral Health Patients Only)  Calculate sleep? (Click Yes once per 24 hr at 0600 safety check) Yes  Documented sleep last 24 hours 7.5

## 2023-03-27 NOTE — Progress Notes (Signed)
[facility] MD Progress Note  03/27/2023 7:16 AM Holly [name]  MRN:  [id number]  Principal Problem: Schizoaffective disorder, bipolar type (HCC) Diagnosis: Principal Problem:   Schizoaffective disorder, bipolar type (HCC) Active Problems:   Mild intellectual disability  Reason for Admission:  Holly [name] is a 27 y.o. female  with a past psychiatric history of ADHD, depression, intellectual disability, schizoaffective disorder, PTSD. Patient initially arrived to [facility] on 11/14 for self-harm with glass and suicidal ideations, and admitted to [facility] voluntarily on 11/16 for acute safety concerns. PPHx is significant for ADHD, depression, intellectual disability, schizoaffective disorder, PTSD, and history of multiple Suicide Attempts, Self Injurious Behavior, and 7 Prior Psychiatric Hospitalizations (2000, 2007x2, 2008, 2015, 2016, 2017x2). PMHx is significant for DM, hypothyroidism.   (admitted on 03/24/2023, total  LOS: 3 days )  Pertinent information discussed during bed progression:  Per RN, patient slept 7.5 hours. 9:15 PM signed 72 hours. Pt requesting changing to another ALF.  Information Obtained Today During Patient Interview: Patient evaluated at bedside. Reports sleep and appetite are adequate. States mood is "pretty good" today. Today patient reports improvements in depression and anxiety  Reports goals for today include contacting LG, who she has not spoken to since admission. She wants to be discharged back to ALF, provided contact number of facility.  On interview, suicidal ideations are not present. Denies any thoughts of self-harm, contracts for safety. Homicidal ideations are not present.   There are no auditory hallucinations, visual hallucinations, paranoid ideations, or delusional thought processes.   Side effects to currently prescribed medications are none. There are no somatic complaints. Reports regular bowel movements.   Collateral Call on 03/27/2023: Holly [name] [phone] -- ALF QP and Vice president  She reports the patient can return to ALF on Wednesday the earliest.  She has no acute safety concerns.  She request the patient be connected with outpatient therapy services at discharge.  She reports the patient has history of manipulative behavior, wanted provider to be made aware that she has been requesting for her legal guardian to be changed.   Collateral: Holly [name], LG with [facility] [place]  [phone] on 03/27/2023 Called at 12:40 PM - unable to leave voicemail due to mailbox being full.    Past Psychiatric History:  Current Psychiatrist: Tamela [name] Current Therapist: no therapist for 1.5 years Previous Psychiatric Diagnoses: schizoaffective disorder bipolar type, intellectual disability, ADHD, PTSD Current psychiatric medications:            Amantadine 100mg  BID, celexa 20 daily, lithobid 900 at bedtime, lybalvi 10-10 at bedtime, trazodone 150 at bedtime  Psychiatric medication history/compliance: -prozac 20, celexa 20, amantadine 100 BID, abilify 10, lithium 900 at bedtime, trazodone 300, cogentin 1, doxepin 25, zyprexa 15 [facility] 08/2015: Schizoaffective disorder bipolar type  Meds:  prozac 20, lithium 450 CR BID, zyprexa 15  Psychiatric Hospitalization hx: 7 Prior Psychiatric Hospitalizations (2000, 2007x2, 2008, 2015, 2016, 2017x2) Psychotherapy hx: history of seeing therapist Neuromodulation history: none History of suicide (obtained from HPI): multiple suicide attempts, most recently 2017 after ingesting 6-8 AA batteries as a suicidal gesture History of homicide or aggression (obtained in HPI): none  Family Psychiatric History:  Mother with alcoholism   Social History:  Living Situation: Was living in ALF with princess. Has legal guardian.  Education: history of mild intellectual disability Occupational hx: reports she gets disability check from the state, has EBT  Marital Status: single  Children: denies  Legal: denies    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   Obesity    PTSD (post-traumatic stress disorder)    Family History:  Family History  Adopted: Yes    Current Medications: Current Facility-Administered Medications  Medication Dose Route Frequency Provider Last Rate Last Admin   acetaminophen (TYLENOL) tablet 650 mg  650 mg Oral Q6H PRN Motley-Mangrum, Jadeka A, PMHNP       alum & mag hydroxide-simeth (MAALOX/MYLANTA) 200-200-20 MG/5ML suspension 30 mL  30 mL Oral Q4H PRN Motley-Mangrum, Jadeka A, PMHNP       apixaban (ELIQUIS) tablet 5 mg  5 mg Oral Q12H Green, Terri L, RPH   5 mg at 03/26/23 2131   citalopram (CELEXA) tablet 20 mg  20 mg Oral Daily Motley-Mangrum, Jadeka A, PMHNP   20 mg at 03/26/23 8469   cyanocobalamin (VITAMIN B12) tablet 1,000 mcg  1,000 mcg Oral Daily Karie Fetch, MD   1,000 mcg at 03/26/23 0805   haloperidol (HALDOL) tablet 5 mg  5 mg Oral TID PRN Motley-Mangrum, Jadeka A, PMHNP       And   diphenhydrAMINE (BENADRYL) capsule 50 mg  50 mg Oral TID PRN Motley-Mangrum, Geralynn Ochs A, PMHNP       hydrocerin (EUCERIN) cream   Topical Daily PRN Karie Fetch, MD       hydrOXYzine (ATARAX) tablet 25 mg  25 mg Oral TID PRN Motley-Mangrum, Jadeka A, PMHNP   25 mg at 03/26/23 2255   influenza vac split trivalent PF (FLULAVAL) injection 0.5 mL  0.5 mL Intramuscular Tomorrow-1000 Attiah, Nadir, MD       levothyroxine (SYNTHROID) tablet 100 mcg  100 mcg Oral Q0600 Motley-Mangrum, Jadeka A, PMHNP   100 mcg at 03/27/23 0605   linaclotide (LINZESS) capsule 290 mcg  290 mcg Oral Daily Motley-Mangrum, Jadeka A, PMHNP   290 mcg at 03/25/23 0744   lithium carbonate (LITHOBID) ER tablet 1,200 mg  1,200 mg Oral QHS Karie Fetch, MD   1,200 mg at 03/26/23 2131   magnesium hydroxide (MILK OF MAGNESIA) suspension 30 mL  30 mL Oral Daily PRN Motley-Mangrum, Jadeka A, PMHNP       metFORMIN (GLUCOPHAGE) tablet 1,000 mg  1,000 mg Oral Q supper Motley-Mangrum, Jadeka A, PMHNP   1,000 mg at 03/26/23 1639   metFORMIN (GLUCOPHAGE) tablet 500 mg  500 mg Oral Daily Motley-Mangrum, Jadeka A, PMHNP   500 mg at 03/26/23 0805   OLANZapine (ZYPREXA) tablet 10 mg  10 mg Oral QHS Karie Fetch, MD   10 mg at 03/26/23 2131   traZODone (DESYREL) tablet 150 mg  150 mg Oral QHS Motley-Mangrum, Jadeka A, PMHNP   150 mg at 03/26/23 2131   traZODone (DESYREL) tablet 50 mg  50 mg Oral QHS PRN Motley-Mangrum, Jadeka A, PMHNP   50 mg at 03/26/23 2255    Lab Results:  Results for orders placed or performed during the hospital encounter of 03/24/23 (from the past 48 hour(s))  Folate     Status: None   Collection Time: 03/25/23  6:35 PM  Result Value Ref Range   Folate 9.1 >5.9 ng/mL    Comment: Performed at [facility], [address] [place],  [place] [address]  Hemoglobin A1c     Status: None   Collection Time: 03/25/23  6:35 PM  Result Value Ref Range   Hgb A1c MFr Bld 5.0 4.8 - 5.6 %    Comment: (NOTE) Pre diabetes:          5.7%-6.4%  Diabetes:              >6.4%  Glycemic control for   <7.0% adults with diabetes    Mean Plasma Glucose 96.8 mg/dL    Comment: Performed at [facility], [address] [place], [place] [address]  Lipid panel     Status: None   Collection Time: 03/25/23  6:35 PM  Result Value Ref Range   Cholesterol 122 0 - 200 mg/dL   Triglycerides 696 <295 mg/dL   HDL 47 >28 mg/dL   Total CHOL/HDL Ratio 2.6 RATIO   VLDL 22 0 - 40 mg/dL   LDL Cholesterol 53 0 - 99 mg/dL    Comment:        Total Cholesterol/HDL:CHD Risk Coronary Heart Disease Risk Table                     Men   Women  1/2 Average Risk   3.4   3.3  Average Risk       5.0   4.4  2 X Average Risk   9.6    7.1  3 X Average Risk  23.4   11.0        Use the calculated Patient Ratio above and the CHD Risk Table to determine the patient's CHD Risk.        ATP III CLASSIFICATION (LDL):  <100     mg/dL   Optimal  413-244  mg/dL   Near or Above                    Optimal  130-159  mg/dL   Borderline  010-272  mg/dL   High  >536     mg/dL   Very High Performed at [facility], [address], [place], [place] [address]   RPR     Status: None   Collection Time: 03/25/23  6:35 PM  Result Value Ref Range   RPR Ser Ql NON REACTIVE NON REACTIVE    Comment: Performed at [facility], [address], [place], [place] [address]  Vitamin B12     Status: Abnormal   Collection Time: 03/25/23  6:35 PM  Result Value Ref Range   Vitamin B-12 155 (L) 180 - 914 pg/mL    Comment: (NOTE) This assay is not validated for testing neonatal or myeloproliferative syndrome specimens for Vitamin B12 levels. Performed at [facility], [address] [place], [place] [address]   VITAMIN D 25 Hydroxy (Vit-D Deficiency, Fractures)     Status: None   Collection Time: 03/25/23  6:35 PM  Result Value Ref Range   Vit D, 25-Hydroxy 40.42 30 - 100 ng/mL    Comment: (NOTE) Vitamin D deficiency has been defined by the Institute of Medicine  and an Endocrine Society practice guideline as a level of serum 25-OH  vitamin D less than 20 ng/mL (1,2). The Endocrine Society went on to  further define vitamin D insufficiency as a level between 21 and 29  ng/mL (2).  1. IOM (Institute of Medicine). 2010. Dietary reference intakes for  calcium and D. [place]: [employer]  [employer]. 2. Holick MF, Binkley [place], Bischoff-Ferrari HA, et al. Evaluation,  treatment, and prevention of vitamin D deficiency: an Endocrine  Society clinical practice guideline, JCEM. 2011 Jul; 96(7): 1911-30.  Performed at [facility], [address], [place], [place] [address]   Lithium  level     Status: Abnormal   Collection Time: 03/25/23  6:35 PM  Result Value Ref Range   Lithium Lvl 0.36 (L) 0.60 - 1.20 mmol/L    Comment: Performed at [facility], [address] [place], [place] [address]  TSH     Status: None   Collection Time: 03/25/23  6:35 PM  Result Value Ref Range   TSH 3.105 0.350 - 4.500 uIU/mL    Comment: Performed by a 3rd Generation assay with a functional sensitivity of <=0.01 uIU/mL. Performed at [facility], [address], [place], [place] [address]     Blood Alcohol level:  Lab Results  Component Value Date   [facility] <10 03/23/2023   ETH <10 02/23/2023    Metabolic Labs: Lab Results  Component Value Date   HGBA1C 5.0 03/25/2023   MPG 96.8 03/25/2023   MPG 102.54 09/23/2022   Lab Results  Component Value Date   PROLACTIN 48.3 (H) 08/21/2015   Lab Results  Component Value Date   CHOL 122 03/25/2023   TRIG 112 03/25/2023   HDL 47 03/25/2023   CHOLHDL 2.6 03/25/2023   VLDL 22 03/25/2023   LDLCALC 53 03/25/2023   LDLCALC 44 08/21/2015    Sleep: Fair  Physical Findings:  Psychiatric Specialty Exam:  Presentation  General Appearance: Fairly groomed  Eye Contact: Fair  IT sales professional Volume:Normal  Handedness:Not assessed   Mood and Affect  Mood: "Better"  Affect:Congruent   Thought Process  Thought Processes:Coherent  Descriptions of Associations:Intact  Orientation: Grossly intact  Thought Content:Logical  History of Schizophrenia/Schizoaffective disorder:Yes  Duration of Psychotic Symptoms:Greater than six months  Hallucinations: None Ideas of Reference:None  Suicidal Thoughts: None Homicidal Thoughts: None  Sensorium  Memory:Immediate Fair; Recent Fair; Remote Fair  Judgment:Poor  Insight:Poor   Executive Functions  Concentration:Fair  Attention Span:Fair  Recall:Fair  Fund of Knowledge:Fair  Language:Fair   Psychomotor  Activity  Psychomotor Activity: Normal  Assets  Assets:Desire for Improvement; Social Support; Leisure Time   Sleep  Sleep:Good   Physical Exam Constitutional:      Appearance: the patient is not toxic-appearing.  Pulmonary:     Effort: Pulmonary effort is normal.  Neurological:     General: No focal deficit present.     Mental Status: the patient is alert and oriented to person, place, and time.   Review of Systems  Respiratory:  Negative for shortness of breath.   Cardiovascular:  Negative for chest pain.  Gastrointestinal:  Negative for abdominal pain, constipation, diarrhea, nausea and vomiting.  Neurological:  Negative for headaches. Reporting lightheadedness this AM.   Blood pressure 103/84, pulse 93, temperature 97.9 F (36.6 C), temperature source Oral, resp. rate 16, height 5\' 3"  (1.6 m), weight 99.8 kg, SpO2 100%. Body mass index is 38.97 kg/m.  Treatment Plan Summary: Daily contact with patient to assess and evaluate symptoms and progress in treatment, Medication management, and Plan    ASSESSMENT: Holly [name] is a 27 y.o. female  with a past psychiatric history of ADHD, depression, intellectual disability, schizoaffective disorder, PTSD. Patient initially arrived to [facility] on 11/14 for self-harm with glass and suicidal ideations, and admitted to [facility]  voluntarily on 11/16 for acute safety concerns. PPHx is significant for ADHD, depression, intellectual disability, schizoaffective disorder, PTSD, and history of multiple Suicide Attempts, Self Injurious Behavior, and 7 Prior Psychiatric Hospitalizations (2000, 2007x2, 2008, 2015, 2016, 2017x2). PMHx is significant for DM, hypothyroidism.    Diagnoses / Active Problems: Schizoaffective disorder bipolar type, current episode depressed  History of mild intellectual disability   PLAN: Safety and Monitoring:   --  VOLUNTARY  admission to inpatient psychiatric unit for safety, stabilization and treatment             -- Daily contact with patient to assess and evaluate symptoms and progress in treatment            -- Patient's case to be discussed in multi-disciplinary team meeting            -- Observation Level : q15 minute checks            -- Vital signs:  q12 hours            -- Precautions: suicide, elopement, and assault  2. Psychiatric Diagnoses and Treatment:  --d/c home amantadine -- continue home celexa 20mg  daily for mood stability  --Continue Li ER 1200mg  daily for mood stability  Li level 0.36 11/16 Repeat Li level 11/20 --continue home zyprexa 10mg  at bedtime for mood stability  -- continue home trazodone 150mg  at bedtime for insomnia  -- The risks/benefits/side-effects/alternatives to this medication were discussed in detail with the patient and time was given for questions. The patient consents to medication trial.              -- Metabolic profile and EKG monitoring obtained while on an atypical antipsychotic  BMI: 38.97  TSH:  Lab Results  Component Value Date   TSH 3.105 03/25/2023   Lipid Panel:  Lab Results  Component Value Date   CHOL 122 03/25/2023   HDL 47 03/25/2023   LDLCALC 53 03/25/2023   TRIG 112 03/25/2023   CHOLHDL 2.6 03/25/2023   HbgA1c: Lab Results  Component Value Date   HGBA1C 5.0 03/25/2023   QTc: 426 11/14              -- Encouraged patient to participate in unit milieu and in scheduled group therapies   -- Short Term Goals: Ability to identify changes in lifestyle to reduce recurrence of condition will improve, Ability to verbalize feelings will improve, Ability to disclose and discuss suicidal ideas, Ability to demonstrate self-control will improve, Ability to identify and develop effective coping behaviors will improve, and Ability to identify triggers associated with substance abuse/mental health issues will improve   -- Long Term Goals: Improvement in symptoms so as ready for discharge   Other PRNS:   -- start acetaminophen 650 mg every 6  hours as needed for mild to moderate pain, fever, and headaches              -- start hydroxyzine 25 mg three times a day as needed for anxiety   -- start aluminum-magnesium hydroxide + simethicone 30 mL every 4 hours as needed for heartburn            -- As needed agitation protocol in-place -- As needed agitation protocol in-place  Labs notable for hypokalemia  (K 3.2), Hgb 11.5 Cr 0.84, AST 15, ALT 9 Tylenol <10 UDS - Alc <10 QTc 426   3. Medical Issues Being Addressed:   #Hypokalemia s/p repletion    #History of diabetes A1c 5.0  Continue home metformin 500 AM, 1000mg  at bedtime    #History of PE  - continue eliquis 5mg  Q12H    #History of hypothyroidism -TSH wnl  -continue home synthroid    #Chronic constipation -continue home linzess   #Vit B12 deficiency -start vit B12 1000U daily  4. Discharge Planning:   -- Social work and case management to assist with discharge planning and identification of hospital follow-up needs prior to discharge  -- Estimated LOS: Pending LG confirmation, ALF can accept as early as Wednesday  -- Discharge Concerns: Need to establish a safety plan; Medication compliance and effectiveness  -- Discharge Goals: Return home with outpatient referrals for mental health follow-up including medication management/psychotherapy   I certify that inpatient services furnished can reasonably be expected to improve the patient's condition.   This note was created using a voice recognition software as a result there may be grammatical errors inadvertently enclosed that do not reflect the nature of this encounter. Every attempt is made to correct such errors.   Dr. Lorri Frederick, MD PGY-2, Psychiatry Residency  11/18/20247:16 AM

## 2023-03-27 NOTE — [facility] Group Notes (Signed)
Spiritual care group on grief and loss facilitated by Chaplain Dyanne Carrel, B[facility]  Group Goal: Support / Education around grief and loss  Members engage in facilitated group support and psycho-social education.  Group Description:  Following introductions and group rules, group members engaged in facilitated group dialogue and support around topic of loss, with particular support around experiences of loss in their lives. Group Identified types of loss (relationships / self / things) and identified patterns, circumstances, and changes that precipitate losses. Reflected on thoughts / feelings around loss, normalized grief responses, and recognized variety in grief experience. Group encouraged individual reflection on safe space and on the coping skills that they are already utilizing.  Group drew on Adlerian / Rogerian and narrative framework  Patient Progress: Holly [name] attended group and actively engaged and participated in group conversation and activities. She shared about the loss of her mother and another mother figure.

## 2023-03-27 NOTE — [facility] Group Notes (Signed)
[facility] Group Notes:  (Nursing/MHT/Case Management/Adjunct)  Date:  03/27/2023  Time:  9:38 PM  Type of Therapy:  Psychoeducational Skills  Participation Level:  Active  Participation Quality:  Appropriate  Affect:  Appropriate  Cognitive:  Appropriate  Insight:  Limited  Engagement in Group:  Developing/Improving  Modes of Intervention:  Education  Summary of Progress/Problems: Patient rated her day as a 10 out of 10 since she had a good talk with her foster mother over the phone. She is unclear as to when she will be discharged. Her goal for tomorrow is to get discharged.   Hazle Coca S 03/27/2023, 9:38 PM

## 2023-03-27 NOTE — Plan of Care (Signed)
  Problem: Education: Goal: Knowledge of [facility] Education information/materials will improve Outcome: Progressing   Problem: Coping: Goal: Ability to verbalize frustrations and anger appropriately will improve Outcome: Progressing   Problem: Safety: Goal: Periods of time without injury will increase Outcome: Progressing   

## 2023-03-27 NOTE — Group Note (Signed)
Date:  03/27/2023 Time:  9:31 AM  Group Topic/Focus:  Goals Group:   The focus of this group is to help patients establish daily goals to achieve during treatment and discuss how the patient can incorporate goal setting into their daily lives to aide in recovery.    Participation Level:  Active  Participation Quality:  Appropriate  Affect:  Appropriate  Cognitive:  Alert  Insight: Appropriate  Engagement in Group:  Engaged  Modes of Intervention:  Orientation  Additional Comments:    Beckie Busing 03/27/2023, 9:31 AM

## 2023-03-27 NOTE — Progress Notes (Signed)
   03/27/23 0800  Psych Admission Type (Psych Patients Only)  Admission Status Voluntary/72 hour document signed  Psychosocial Assessment  Patient Complaints Anxiety  Eye Contact Fair  Facial Expression Animated;Anxious  Affect Appropriate to circumstance  Speech Logical/coherent  Interaction Assertive  Motor Activity Fidgety  Appearance/Hygiene Unremarkable  Behavior Characteristics Appropriate to situation  Mood Anxious;Pleasant  Thought Process  Coherency WDL  Content WDL  Delusions None reported or observed  Perception WDL  Hallucination None reported or observed  Judgment Limited  Confusion None  Danger to Self  Current suicidal ideation? Denies  Self-Injurious Behavior No self-injurious ideation or behavior indicators observed or expressed   Agreement Not to Harm Self Yes  Description of Agreement Verbal  Danger to Others  Danger to Others None reported or observed

## 2023-03-27 NOTE — Group Note (Signed)
Date:  03/27/2023 Time:  11:08 AM  Group Topic/Focus:  Wellness Toolbox:   The focus of this group is to discuss various aspects of wellness, balancing those aspects and exploring ways to increase the ability to experience wellness.  Patients will create a wellness toolbox for use upon discharge.    Participation Level:  Active  Participation Quality:  Appropriate  Affect:  Appropriate  Cognitive:  Alert  Insight: Good  Engagement in Group:  Engaged  Modes of Intervention:  Discussion  Additional Comments:    Beckie Busing 03/27/2023, 11:08 AM

## 2023-03-28 MED ORDER — ONDANSETRON 4 MG PO TBDP: 4.0000 mg | ORAL_TABLET | Freq: Three times a day (TID) | ORAL | Status: DC | PRN

## 2023-03-28 NOTE — Progress Notes (Signed)
     03/28/2023       2:13 PM   Holly [name]   Type of Note: AFL  Spoke with both [name] (LG) and [name]. Pt resides at an [facility] apartment and lives with someone at all times. (Pt's address is [address]). Pt's apartment is currently being cleaned and prepared for patient to return back there. Pt cannot return until Friday evening. [name] will come to pick patient up on Friday at 5:00PM, that is when apartment will be ready for patient. CSW will continue to assist.   Signed:  Amaree Loisel, LCSW-A 03/28/2023  2:13 PM

## 2023-03-28 NOTE — [facility] Suicide Risk Assessment (Signed)
[facility] INPATIENT:  Family/Significant Other Suicide Prevention Education  Suicide Prevention Education:  Education Completed; Holly [name] (legal guardian) [phone] AND Holly [name] (QP at [facility]) [phone],  (name of family member/significant other) has been identified by the patient as the family member/significant other with whom the patient will be residing, and identified as the person(s) who will aid the patient in the event of a mental health crisis (suicidal ideations/suicide attempt).  With written consent from the patient, the family member/significant other has been provided the following suicide prevention education, prior to the and/or following the discharge of the patient.  Patient lives at an apartment complex that is AFL (assisted family living). Pt has someone supervising her at all times due to safety concerns. Pt's apartment, according to [facility], will not be ready for pt to return to until Friday at 5:00PM. Holly [name] will pick pt up at discharge. No firearms or weapons in the home.   The suicide prevention education provided includes the following: Suicide risk factors Suicide prevention and interventions National Suicide Hotline telephone number [facility] assessment telephone number [facility] Emergency Assistance 911 [facility] and/or Residential Mobile Crisis Unit telephone number  Request made of family/significant other to: Remove weapons (e.g., guns, rifles, knives), all items previously/currently identified as safety concern.   Remove drugs/medications (over-the-counter, prescriptions, illicit drugs), all items previously/currently identified as a safety concern.  The family member/significant other verbalizes understanding of the suicide prevention education information provided.  The family member/significant other agrees to remove the items of safety concern listed above.  Holly [name] 03/28/2023, 3:56 PM

## 2023-03-28 NOTE — Progress Notes (Addendum)
     03/28/2023       11:27 AM   Holly [name]   Type of Note: [facility]  Spoke with patient this morning who states that at discharge, she will have a full time (24/7) assistant who will live with her in her apartment here in [place]. This assistant is employed through [facility]. The contact for this ALF is [name] or her husband [phone]).   CSW attempted to contact [name] but phone went to voicemail, voicemail is not set up. CSW will continue to assist.   12:45PM: Spoke with [name] supervisor, [name] who will have [name] call this CSW at 3:00PM. Will continue to assist.   Signed:  Kalen Neidert, LCSW-A 03/28/2023  11:27 AM

## 2023-03-28 NOTE — Progress Notes (Addendum)
     03/28/2023       11:12 AM   Analicia N Arther   Type of Note: Legal Guardian  CSW attempted to contact [name] but was unable to reach her, vm full as well. Called the [facility] [place], [place] office [phone]) inquiring about pt having guardian to get confirmation. Pt does have a legal guardian through this agency and is assigned with [name], [phone]. CSW was then transferred to Jefferey Pica, [name] [phone], and reached their voicemail as well. CSW requested for a call back. CSW will continue to assist.    Signed:  Tiara Bartoli, LCSW-A 03/28/2023  11:12 AM

## 2023-03-28 NOTE — Group Note (Signed)
[facility] LCSW Group Therapy Note   Group Date: 03/28/2023 Start Time: 1100 End Time: 1200   Type of Therapy and Topic: Group Therapy: Avoiding Self-Sabotaging and Enabling Behaviors  Participation Level: Active  Mood:  Description of Group:  In this group, patients will learn how to identify obstacles, self-sabotaging and enabling behaviors, as well as: what are they, why do we do them and what needs these behaviors meet. Discuss unhealthy relationships and how to have positive healthy boundaries with those that sabotage and enable. Explore aspects of self-sabotage and enabling in yourself and how to limit these self-destructive behaviors in everyday life.   Therapeutic Goals: 1. Patient will identify one obstacle that relates to self-sabotage and enabling behaviors 2. Patient will identify one personal self-sabotaging or enabling behavior they did prior to admission 3. Patient will state a plan to change the above identified behavior 4. Patient will demonstrate ability to communicate their needs through discussion and/or role play.    Summary of Patient Progress:   During today's group therapy session, the patient demonstrated significant progress in their understanding of personal challenges and emotional patterns. They showed good insight into how past experiences and thought processes contribute to current struggles, particularly in areas such as interpersonal relationships and stress management. The patient actively participated in group discussions, reflecting on their behaviors and showing an openness to feedback from peers and the therapist.   Therapeutic Modalities:  Cognitive Behavioral Therapy Person-Centered Therapy Motivational Interviewing    Marinda Elk, LCSW

## 2023-03-28 NOTE — Progress Notes (Signed)
[facility] MD Progress Note  03/28/2023 6:46 AM Holly [name]  MRN:  [id number]  Principal Problem: Schizoaffective disorder, bipolar type (HCC) Diagnosis: Principal Problem:   Schizoaffective disorder, bipolar type (HCC) Active Problems:   Mild intellectual disability  Reason for Admission:  Holly [name] is a 27 y.o. female  with a past psychiatric history of ADHD, depression, intellectual disability, schizoaffective disorder, PTSD. Patient initially arrived to [facility] on 11/14 for self-harm with glass and suicidal ideations, and admitted to [facility] voluntarily on 11/16 for acute safety concerns. PPHx is significant for ADHD, depression, intellectual disability, schizoaffective disorder, PTSD, and history of multiple Suicide Attempts, Self Injurious Behavior, and 7 Prior Psychiatric Hospitalizations (2000, 2007x2, 2008, 2015, 2016, 2017x2). PMHx is significant for DM, hypothyroidism.   (admitted on 03/24/2023, total  LOS: 4 days )  Pertinent information discussed during bed progression:  No acute events overnight  Information Obtained Today During Patient Interview: Patient was evaluated at bedside.  She reports she slept well overnight, but woke up with some nausea.  She reports having 1 episode of vomiting prior to being interviewed, is amenable to having as needed Zofran.  She denies nausea or abdominal pain.  She reports she had breakfast this morning.  She denies any symptoms of depression, remains a 0/10.  She is reporting some mild anxiety related to anticipation of being discharged.  Otherwise no concerns.  On interview, suicidal ideations are not present . Homicidal ideations are not present.   There are no auditory hallucinations, visual hallucinations, paranoid ideations, or delusional thought processes.   Side effects to currently prescribed medications are none. There are no somatic complaints. Reports regular bowel movements.    Collateral Call on 03/28/23  [name], LG with  [facility] [place]  [phone] Maralyn Sago reports she spoke with the patient and ALF QP yesterday.  She has no concerns, believes the patient to be at baseline.  She is comfortable with patient discharging back to her ALF on Wednesday.  All questions were answered.   Collateral Call on 03/27/2023: [name] [phone] -- ALF QP and Vice president  She reports the patient can return to ALF on Wednesday the earliest.  She has no acute safety concerns.  She request the patient be connected with outpatient therapy services at discharge.  She reports the patient has history of manipulative behavior, wanted provider to be made aware that she has been requesting for her legal guardian to be changed.   Past Psychiatric History:  Current Psychiatrist: Tamela Oddi Current Therapist: no therapist for 1.5 years Previous Psychiatric Diagnoses: schizoaffective disorder bipolar type, intellectual disability, ADHD, PTSD Current psychiatric medications:            Amantadine 100mg  BID, celexa 20 daily, lithobid 900 at bedtime, lybalvi 10-10 at bedtime, trazodone 150 at bedtime  Psychiatric medication history/compliance: -prozac 20, celexa 20, amantadine 100 BID, abilify 10, lithium 900 at bedtime, trazodone 300, cogentin 1, doxepin 25, zyprexa 15 [facility] 08/2015: Schizoaffective disorder bipolar type  Meds:  prozac 20, lithium 450 CR BID, zyprexa 15  Psychiatric Hospitalization hx: 7 Prior Psychiatric Hospitalizations (2000, 2007x2, 2008, 2015, 2016, 2017x2) Psychotherapy hx: history of seeing therapist Neuromodulation history: none History of suicide (obtained from HPI): multiple suicide attempts, most recently 2017 after ingesting 6-8 AA batteries as a suicidal gesture History of homicide or aggression (obtained in HPI): none  Family Psychiatric History:  Mother with alcoholism   Social History:  Living Situation: Was living in ALF with princess. Has legal guardian.  Education: history of mild intellectual  disability Occupational hx: reports she gets disability check from the state, has EBT  Marital Status: single  Children: denies  Legal: denies   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   Obesity    PTSD (post-traumatic stress disorder)    Family History:  Family History  Adopted: Yes    Current Medications: Current Facility-Administered Medications  Medication Dose Route Frequency Provider Last Rate Last Admin   acetaminophen (TYLENOL) tablet 650 mg  650 mg Oral Q6H PRN Motley-Mangrum, Jadeka A, PMHNP       alum & mag hydroxide-simeth (MAALOX/MYLANTA) 200-200-20 MG/5ML suspension 30 mL  30 mL Oral Q4H PRN Motley-Mangrum, Jadeka A, PMHNP       apixaban (ELIQUIS) tablet 5 mg  5 mg Oral Q12H Green, Terri L, RPH   5 mg at 03/27/23 2135   citalopram (CELEXA) tablet 20 mg  20 mg Oral Daily Motley-Mangrum, Jadeka A, PMHNP   20 mg at 03/27/23 1610   cyanocobalamin (VITAMIN B12) tablet 1,000 mcg  1,000 mcg Oral Daily Karie Fetch, MD   1,000 mcg at 03/27/23 9604   haloperidol (HALDOL) tablet 5 mg  5 mg Oral TID PRN Motley-Mangrum, Jadeka A, PMHNP       And   diphenhydrAMINE (BENADRYL) capsule 50 mg  50 mg Oral TID PRN Motley-Mangrum, Geralynn Ochs A, PMHNP       hydrocerin (EUCERIN) cream   Topical Daily PRN Karie Fetch, MD       hydrOXYzine (ATARAX) tablet 25 mg  25 mg Oral TID PRN Motley-Mangrum, Jadeka A, PMHNP   25 mg at 03/26/23 2255   influenza vac split trivalent PF (FLULAVAL) injection 0.5 mL  0.5 mL Intramuscular Tomorrow-1000 Attiah, Nadir, MD       levothyroxine (SYNTHROID) tablet 100 mcg  100 mcg Oral Q0600 Motley-Mangrum, Jadeka A, PMHNP   100 mcg at 03/28/23 0527   linaclotide (LINZESS) capsule 290 mcg  290 mcg  Oral Daily Motley-Mangrum, Jadeka A, PMHNP   290 mcg at 03/27/23 1703   lithium carbonate (LITHOBID) ER tablet 1,200 mg  1,200 mg Oral QHS Karie Fetch, MD   1,200 mg at 03/27/23 2134   magnesium hydroxide (MILK OF MAGNESIA) suspension 30 mL  30 mL Oral Daily PRN Motley-Mangrum, Jadeka A, PMHNP       metFORMIN (GLUCOPHAGE) tablet 1,000 mg  1,000 mg Oral Q supper Motley-Mangrum, Jadeka A, PMHNP   1,000 mg at 03/27/23 1703   metFORMIN (GLUCOPHAGE) tablet 500 mg  500 mg Oral Daily Motley-Mangrum, Jadeka A, PMHNP   500 mg at 03/27/23 0806   OLANZapine (ZYPREXA) tablet 10 mg  10 mg Oral QHS Karie Fetch, MD   10 mg at 03/27/23 2208   traZODone (DESYREL) tablet 150 mg  150 mg Oral QHS Motley-Mangrum, Jadeka A, PMHNP   150 mg at 03/27/23 2135   traZODone (DESYREL) tablet 50 mg  50 mg Oral QHS PRN Motley-Mangrum, Jadeka A, PMHNP   50 mg at 03/26/23 2255    Lab Results:  No results found for this or any previous visit (from the past 48 hour(s)).   Blood Alcohol level:  Lab Results  Component  Value Date   [facility] <10 03/23/2023   ETH <10 02/23/2023    Metabolic Labs: Lab Results  Component Value Date   HGBA1C 5.0 03/25/2023   MPG 96.8 03/25/2023   MPG 102.54 09/23/2022   Lab Results  Component Value Date   PROLACTIN 48.3 (H) 08/21/2015   Lab Results  Component Value Date   CHOL 122 03/25/2023   TRIG 112 03/25/2023   HDL 47 03/25/2023   CHOLHDL 2.6 03/25/2023   VLDL 22 03/25/2023   LDLCALC 53 03/25/2023   LDLCALC 44 08/21/2015    Sleep: Fair  Physical Findings:  Psychiatric Specialty Exam:  Presentation  General Appearance: Fairly groomed  Eye Contact: Fair  IT sales professional Volume:Normal  Handedness:Not assessed   Mood and Affect  Mood: "Better"  Affect:Congruent   Thought Process  Thought Processes:Coherent  Descriptions of Associations:Intact  Orientation: Grossly intact  Thought Content:Logical  History of  Schizophrenia/Schizoaffective disorder:Yes  Duration of Psychotic Symptoms:Greater than six months  Hallucinations: None Ideas of Reference:None  Suicidal Thoughts: None Homicidal Thoughts: None  Sensorium  Memory:Immediate Fair; Recent Fair; Remote Fair  Judgment:Poor  Insight:Poor   Executive Functions  Concentration:Fair  Attention Span:Fair  Recall:Fair  Fund of Knowledge:Fair  Language:Fair   Psychomotor Activity  Psychomotor Activity: Normal  Assets  Assets:Desire for Improvement; Social Support; Leisure Time   Sleep  Sleep:Good   Physical Exam Constitutional:      Appearance: the patient is not toxic-appearing.  Pulmonary:     Effort: Pulmonary effort is normal.  Neurological:     General: No focal deficit present.     Mental Status: the patient is alert and oriented to person, place, and time.   Review of Systems  Respiratory:  Negative for shortness of breath.   Cardiovascular:  Negative for chest pain.  Gastrointestinal:  Negative for abdominal pain, constipation, diarrhea, nausea and vomiting.  Neurological:  Negative for headaches. Reporting lightheadedness this AM.   Blood pressure 110/80, pulse 95, temperature 97.7 F (36.5 C), temperature source Oral, resp. rate 16, height 5\' 3"  (1.6 m), weight 99.8 kg, SpO2 100%. Body mass index is 38.97 kg/m.  Treatment Plan Summary: Daily contact with patient to assess and evaluate symptoms and progress in treatment, Medication management, and Plan    ASSESSMENT: IVY SPRITZER is a 27 y.o. female  with a past psychiatric history of ADHD, depression, intellectual disability, schizoaffective disorder, PTSD. Patient initially arrived to [facility] on 11/14 for self-harm with glass and suicidal ideations, and admitted to [facility] voluntarily on 11/16 for acute safety concerns. PPHx is significant for ADHD, depression, intellectual disability, schizoaffective disorder, PTSD, and history of multiple Suicide  Attempts, Self Injurious Behavior, and 7 Prior Psychiatric Hospitalizations (2000, 2007x2, 2008, 2015, 2016, 2017x2). PMHx is significant for DM, hypothyroidism.    Diagnoses / Active Problems: Schizoaffective disorder bipolar type, current episode depressed  History of mild intellectual disability   PLAN: Safety and Monitoring:   --  VOLUNTARY  admission to inpatient psychiatric unit for safety, stabilization and treatment            -- Daily contact with patient to assess and evaluate symptoms and progress in treatment            -- Patient's case to be discussed in multi-disciplinary team meeting            -- Observation Level : q15 minute checks            -- Vital signs:  q12  hours            -- Precautions: suicide, elopement, and assault  2. Psychiatric Diagnoses and Treatment:  --d/c home amantadine -- continue home celexa 20mg  daily for mood stability  --Continue Li ER 1200mg  daily for mood stability  Li level 0.36 11/16 Repeat Li level 11/20 --continue home zyprexa 10mg  at bedtime for mood stability  -- continue home trazodone 150mg  at bedtime for insomnia  -- The risks/benefits/side-effects/alternatives to this medication were discussed in detail with the patient and time was given for questions. The patient consents to medication trial.              -- Metabolic profile and EKG monitoring obtained while on an atypical antipsychotic  BMI: 38.97  TSH:  Lab Results  Component Value Date   TSH 3.105 03/25/2023   Lipid Panel:  Lab Results  Component Value Date   CHOL 122 03/25/2023   HDL 47 03/25/2023   LDLCALC 53 03/25/2023   TRIG 112 03/25/2023   CHOLHDL 2.6 03/25/2023   HbgA1c: Lab Results  Component Value Date   HGBA1C 5.0 03/25/2023   QTc: 426 11/14              -- Encouraged patient to participate in unit milieu and in scheduled group therapies   -- Short Term Goals: Ability to identify changes in lifestyle to reduce recurrence of condition will improve,  Ability to verbalize feelings will improve, Ability to disclose and discuss suicidal ideas, Ability to demonstrate self-control will improve, Ability to identify and develop effective coping behaviors will improve, and Ability to identify triggers associated with substance abuse/mental health issues will improve   -- Long Term Goals: Improvement in symptoms so as ready for discharge   Other PRNS:   -- start acetaminophen 650 mg every 6 hours as needed for mild to moderate pain, fever, and headaches              -- start hydroxyzine 25 mg three times a day as needed for anxiety   -- start aluminum-magnesium hydroxide + simethicone 30 mL every 4 hours as needed for heartburn            -- As needed agitation protocol in-place -- As needed agitation protocol in-place  Labs notable for hypokalemia  (K 3.2), Hgb 11.5 Cr 0.84, AST 15, ALT 9 Tylenol <10 UDS - Alc <10 QTc 426   3. Medical Issues Being Addressed:   #Hypokalemia s/p repletion    #History of diabetes A1c 5.0 Continue home metformin 500 AM, 1000mg  at bedtime    #History of PE  - continue eliquis 5mg  Q12H    #History of hypothyroidism -TSH wnl  -continue home synthroid    #Chronic constipation -continue home linzess   #Vit B12 deficiency -start vit B12 1000U daily  4. Discharge Planning:   -- Social work and case management to assist with discharge planning and identification of hospital follow-up needs prior to discharge  -- Estimated LOS: Pending LG confirmation, ALF can accept as early as Wednesday   -- Discharge Concerns: Need to establish a safety plan; Medication compliance and effectiveness  -- Discharge Goals: Return home with outpatient referrals for mental health follow-up including medication management/psychotherapy   I certify that inpatient services furnished can reasonably be expected to improve the patient's condition.   This note was created using a voice recognition software as a result there may be  grammatical errors inadvertently enclosed that do not reflect the nature of  this encounter. Every attempt is made to correct such errors.   Dr. Lorri Frederick, MD PGY-2, Psychiatry Residency  11/19/20246:46 AM

## 2023-03-28 NOTE — Progress Notes (Signed)
Patient rated her depression level 0/10 and her anxiety level 4/10 with 10 being the highest and 0 none. Medication and group compliant. Pt denied SI/HI and AVH. Pt observed interacting well with peers. Appetite good on shift. Safety maintained.  03/28/23 0815  Psych Admission Type (Psych Patients Only)  Admission Status Voluntary/72 hour document signed  Psychosocial Assessment  Patient Complaints Anxiety  Eye Contact Fair  Facial Expression Animated  Affect Appropriate to circumstance  Speech Logical/coherent  Interaction Assertive  Motor Activity Other (Comment) (WNL)  Appearance/Hygiene Unremarkable  Behavior Characteristics Appropriate to situation  Mood Anxious;Pleasant  Thought Process  Coherency WDL  Content WDL  Delusions None reported or observed  Perception WDL  Hallucination None reported or observed  Judgment Limited  Confusion None  Danger to Self  Current suicidal ideation? Denies  Self-Injurious Behavior No self-injurious ideation or behavior indicators observed or expressed   Agreement Not to Harm Self Yes  Description of Agreement Verbal  Danger to Others  Danger to Others None reported or observed

## 2023-03-28 NOTE — Plan of Care (Signed)
  Problem: Education: Goal: Emotional status will improve Outcome: Progressing   Problem: Education: Goal: Mental status will improve Outcome: Progressing   Problem: Activity: Goal: Interest or engagement in activities will improve Outcome: Progressing Goal: Sleeping patterns will improve Outcome: Progressing   Problem: Coping: Goal: Ability to verbalize frustrations and anger appropriately will improve Outcome: Progressing Goal: Ability to demonstrate self-control will improve Outcome: Progressing   Problem: Safety: Goal: Periods of time without injury will increase Outcome: Progressing

## 2023-03-28 NOTE — Group Note (Signed)
Recreation Therapy Group Note   Group Topic:Animal Assisted Therapy   Group Date: 03/28/2023 Start Time: 0950 End Time: 1030 Facilitators: Shadrack Brummitt-McCall, LRT,CTRS Location: [address] Dayroom   Animal-Assisted Activity (AAA) Program Checklist/Progress Notes Patient Eligibility Criteria Checklist & Daily Group note for Rec Tx Intervention  AAA/T Program Assumption of Risk Form signed by Patient/ or Parent Legal Guardian Yes  Patient understands his/her participation is voluntary Yes   Affect/Mood: N/A   Participation Level: Did not attend    Clinical Observations/Individualized Feedback:      Plan: Continue to engage patient in RT group sessions 2-3x/week.   Meghann Landing-McCall, LRT,CTRS 03/28/2023 1:30 PM

## 2023-03-28 NOTE — Progress Notes (Signed)
Adult Psychoeducational Group Note  Date:  03/28/2023 Time:  11:37 PM  Group Topic/Focus:  Wrap-Up Group:   The focus of this group is to help patients review their daily goal of treatment and discuss progress on daily workbooks.  Participation Level:  Active  Participation Quality:  Appropriate  Affect:  Appropriate  Cognitive:  Appropriate  Insight: Appropriate  Engagement in Group:  Engaged  Modes of Intervention:  Discussion  Additional Comments:  Pt stated her day was great. Pt stated her goal for the day was to discharge.  Pt did not meet goal, but stated she is going to be discharge tomorrow.  Wynema Birch D 03/28/2023, 11:37 PM

## 2023-03-29 LAB — LITHIUM LEVEL: Lithium Lvl: 0.32 mmol/L — ABNORMAL LOW (ref 0.60–1.20)

## 2023-03-29 NOTE — Plan of Care (Signed)
  Problem: Education: Goal: Knowledge of [facility] Education information/materials will improve Outcome: Progressing   Problem: Education: Goal: Mental status will improve Outcome: Progressing   Problem: Education: Goal: Verbalization of understanding the information provided will improve Outcome: Progressing   Problem: Coping: Goal: Ability to verbalize frustrations and anger appropriately will improve Outcome: Progressing   Problem: Coping: Goal: Ability to identify and develop effective coping behavior will improve Outcome: Progressing   Problem: Self-Concept: Goal: Ability to disclose and discuss suicidal ideas will improve Outcome: Progressing

## 2023-03-29 NOTE — Progress Notes (Signed)
[facility] MD Progress Note  03/29/2023 12:35 PM Holly [name]  MRN:  [id number]  Principal Problem: Schizoaffective disorder, bipolar type (HCC) Diagnosis: Principal Problem:   Schizoaffective disorder, bipolar type (HCC) Active Problems:   Mild intellectual disability  Reason for Admission:  Holly [name] is a 27 y.o. female  with a past psychiatric history of ADHD, depression, intellectual disability, schizoaffective disorder, PTSD. Patient initially arrived to [facility] on 11/14 for self-harm with glass and suicidal ideations, and admitted to [facility] voluntarily on 11/16 for acute safety concerns. PPHx is significant for ADHD, depression, intellectual disability, schizoaffective disorder, PTSD, and history of multiple Suicide Attempts, Self Injurious Behavior, and 7 Prior Psychiatric Hospitalizations (2000, 2007x2, 2008, 2015, 2016, 2017x2). PMHx is significant for DM, hypothyroidism.   (admitted on 03/24/2023, total  LOS: 5 days )  Pertinent information discussed during bed progression:  No acute events overnight.  Patient's disposition discussed during treatment team, social work confirms that [facility] will not have the patient's home ready until Friday.  Plan is to discharge patient on Friday at 5 PM.  Information Obtained Today During Patient Interview: Patient was evaluated at bedside.  Reports she is doing well, reports adequate sleep and appetite.  Reports her symptoms of nausea have resolved since yesterday.  Denies any nausea, vomiting, or diarrhea.  She reports her mood has remained stable, notes no significant depression or anxiety.  She is tolerating her current medication regimen, denies any side effects.  On assessment she denies suicidal ideations.  She denies homicidal ideations.  There are no auditory or visual hallucinations.  She denies delusional thought processes.  There are no apparent paranoid ideations.    Collateral Call on 03/27/2023: [name] [phone] -- ALF QP  and Vice president  She reports the patient can return to ALF on Wednesday the earliest.  She has no acute safety concerns.  She request the patient be connected with outpatient therapy services at discharge.  She reports the patient has history of manipulative behavior, wanted provider to be made aware that she has been requesting for her legal guardian to be changed.   Past Psychiatric History:  Current Psychiatrist: Tamela Oddi Current Therapist: no therapist for 1.5 years Previous Psychiatric Diagnoses: schizoaffective disorder bipolar type, intellectual disability, ADHD, PTSD Current psychiatric medications:            Amantadine 100mg  BID, celexa 20 daily, lithobid 900 at bedtime, lybalvi 10-10 at bedtime, trazodone 150 at bedtime  Psychiatric medication history/compliance: -prozac 20, celexa 20, amantadine 100 BID, abilify 10, lithium 900 at bedtime, trazodone 300, cogentin 1, doxepin 25, zyprexa 15 [facility] 08/2015: Schizoaffective disorder bipolar type  Meds:  prozac 20, lithium 450 CR BID, zyprexa 15  Psychiatric Hospitalization hx: 7 Prior Psychiatric Hospitalizations (2000, 2007x2, 2008, 2015, 2016, 2017x2) Psychotherapy hx: history of seeing therapist Neuromodulation history: none History of suicide (obtained from HPI): multiple suicide attempts, most recently 2017 after ingesting 6-8 AA batteries as a suicidal gesture History of homicide or aggression (obtained in HPI): none  Family Psychiatric History:  Mother with alcoholism   Social History:  Living Situation: Was living in ALF with princess. Has legal guardian.  Education: history of mild intellectual disability Occupational hx: reports she gets disability check from the state, has EBT  Marital Status: single  Children: denies  Legal: denies   Past Medical History:  Past Medical History:  Diagnosis Date   ADHD (attention deficit hyperactivity disorder)    B12 deficiency 03/26/2023   Bipolar 1 disorder (HCC)  Depression    Diabetes mellitus    Gluten free diet    Hypothyroidism    Mood disorder (HCC)    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   Obesity    PTSD (post-traumatic stress disorder)    Family History:  Family History  Adopted: Yes    Current Medications: Current Facility-Administered Medications  Medication Dose Route Frequency Provider Last Rate Last Admin   acetaminophen (TYLENOL) tablet 650 mg  650 mg Oral Q6H PRN Motley-Mangrum, Jadeka A, PMHNP   650 mg at 03/28/23 1628   alum & mag hydroxide-simeth (MAALOX/MYLANTA) 200-200-20 MG/5ML suspension 30 mL  30 mL Oral Q4H PRN Motley-Mangrum, Jadeka A, PMHNP       apixaban (ELIQUIS) tablet 5 mg  5 mg Oral Q12H Green, Terri L, RPH   5 mg at 03/29/23 0746   citalopram (CELEXA) tablet 20 mg  20 mg Oral Daily Motley-Mangrum, Jadeka A, PMHNP   20 mg at 03/29/23 0746   cyanocobalamin (VITAMIN B12) tablet 1,000 mcg  1,000 mcg Oral Daily Karie Fetch, MD   1,000 mcg at 03/29/23 0746   haloperidol (HALDOL) tablet 5 mg  5 mg Oral TID PRN Motley-Mangrum, Jadeka A, PMHNP       And   diphenhydrAMINE (BENADRYL) capsule 50 mg  50 mg Oral TID PRN Motley-Mangrum, Geralynn Ochs A, PMHNP       hydrocerin (EUCERIN) cream   Topical Daily PRN Karie Fetch, MD       hydrOXYzine (ATARAX) tablet 25 mg  25 mg Oral TID PRN Motley-Mangrum, Jadeka A, PMHNP   25 mg at 03/29/23 0454   levothyroxine (SYNTHROID) tablet 100 mcg  100 mcg Oral Q0600 Motley-Mangrum, Jadeka A, PMHNP   100 mcg at 03/29/23 0644   linaclotide (LINZESS) capsule 290 mcg  290 mcg Oral Daily Motley-Mangrum, Jadeka A, PMHNP   290 mcg at 03/29/23 0747   lithium carbonate (LITHOBID) ER tablet 1,200 mg  1,200 mg Oral QHS Karie Fetch, MD   1,200 mg at 03/28/23 2132   magnesium hydroxide (MILK OF MAGNESIA) suspension 30 mL  30 mL Oral Daily PRN Motley-Mangrum, Jadeka A, PMHNP       metFORMIN (GLUCOPHAGE) tablet 1,000 mg  1,000 mg Oral Q supper Motley-Mangrum, Jadeka A, PMHNP   1,000 mg at 03/28/23  1722   metFORMIN (GLUCOPHAGE) tablet 500 mg  500 mg Oral Daily Motley-Mangrum, Jadeka A, PMHNP   500 mg at 03/29/23 0746   OLANZapine (ZYPREXA) tablet 10 mg  10 mg Oral QHS Karie Fetch, MD   10 mg at 03/28/23 2131   ondansetron (ZOFRAN-ODT) disintegrating tablet 4 mg  4 mg Oral Q8H PRN Carrion-Carrero, Clemens Lachman, MD       traZODone (DESYREL) tablet 150 mg  150 mg Oral QHS Motley-Mangrum, Jadeka A, PMHNP   150 mg at 03/28/23 2131   traZODone (DESYREL) tablet 50 mg  50 mg Oral QHS PRN Motley-Mangrum, Jadeka A, PMHNP   50 mg at 03/26/23 2255    Lab Results:  No results found for this or any previous visit (from the past 48 hour(s)).   Blood Alcohol level:  Lab Results  Component Value Date   [facility] <10 03/23/2023   ETH <10 02/23/2023    Metabolic Labs: Lab Results  Component Value Date   HGBA1C 5.0 03/25/2023   MPG 96.8 03/25/2023   MPG 102.54 09/23/2022   Lab Results  Component Value Date   PROLACTIN 48.3 (H) 08/21/2015   Lab Results  Component Value Date  CHOL 122 03/25/2023   TRIG 112 03/25/2023   HDL 47 03/25/2023   CHOLHDL 2.6 03/25/2023   VLDL 22 03/25/2023   LDLCALC 53 03/25/2023   LDLCALC 44 08/21/2015    Sleep: Fair  Physical Findings:  Psychiatric Specialty Exam:  Presentation  General Appearance: Fairly groomed  Eye Contact: Good  Speech:Clear and Coherent  Speech Volume:Normal  Handedness:Not assessed   Mood and Affect  Mood: "Good"  Affect:Congruent   Thought Process  Thought Processes:Coherent  Descriptions of Associations:Intact  Orientation: Grossly intact  Thought Content:Logical  History of Schizophrenia/Schizoaffective disorder:Yes  Duration of Psychotic Symptoms:Greater than six months  Hallucinations: Denies Ideas of Reference:None  Suicidal Thoughts: Denies Homicidal Thoughts: Denies  Sensorium  Memory:Immediate Fair; Recent Fair; Remote Fair  Judgment: Fair  Insight: Limited   Executive Functions   Concentration:Fair  Attention Span:Fair  Recall:Fair  Fund of Knowledge:Fair  Language:Fair   Psychomotor Activity  Psychomotor Activity: Normal  Assets  Assets:Desire for Improvement; Social Support; Leisure Time   Sleep  Sleep:Good   Physical Exam Constitutional:      Appearance: the patient is not toxic-appearing.  Pulmonary:     Effort: Pulmonary effort is normal.  Neurological:     General: No focal deficit present.     Mental Status: the patient is alert and oriented to person, place, and time.   Review of Systems  Respiratory:  Negative for shortness of breath.   Cardiovascular:  Negative for chest pain.  Gastrointestinal:  Negative for abdominal pain, constipation, diarrhea, nausea and vomiting.  Neurological:  Negative for headaches. Reporting lightheadedness this AM.   Blood pressure 112/83, pulse (!) 105, temperature 97.9 F (36.6 C), temperature source Oral, resp. rate 16, height 5\' 3"  (1.6 m), weight 99.8 kg, SpO2 100%. Body mass index is 38.97 kg/m.  Treatment Plan Summary: Daily contact with patient to assess and evaluate symptoms and progress in treatment, Medication management, and Plan    ASSESSMENT: Holly [name] is a 27 y.o. female  with a past psychiatric history of ADHD, depression, intellectual disability, schizoaffective disorder, PTSD. Patient initially arrived to [facility] on 11/14 for self-harm with glass and suicidal ideations, and admitted to [facility] voluntarily on 11/16 for acute safety concerns. PPHx is significant for ADHD, depression, intellectual disability, schizoaffective disorder, PTSD, and history of multiple Suicide Attempts, Self Injurious Behavior, and 7 Prior Psychiatric Hospitalizations (2000, 2007x2, 2008, 2015, 2016, 2017x2). PMHx is significant for DM, hypothyroidism.    Diagnoses / Active Problems: Schizoaffective disorder bipolar type, current episode depressed  History of mild intellectual disability   PLAN: Safety and  Monitoring:   --  VOLUNTARY  admission to inpatient psychiatric unit for safety, stabilization and treatment            -- Daily contact with patient to assess and evaluate symptoms and progress in treatment            -- Patient's case to be discussed in multi-disciplinary team meeting            -- Observation Level : q15 minute checks            -- Vital signs:  q12 hours            -- Precautions: suicide, elopement, and assault  2. Psychiatric Diagnoses and Treatment:  --d/c home amantadine -- Continue home celexa 20mg  daily for mood stability  --Continue Li ER 1200mg  daily for mood stability  Li level 0.36 11/16 Repeat Li level 11/20 - pending --continue home  zyprexa 10mg  at bedtime for mood stability  -- continue home trazodone 150mg  at bedtime for insomnia  -- The risks/benefits/side-effects/alternatives to this medication were discussed in detail with the patient and time was given for questions. The patient consents to medication trial.              -- Metabolic profile and EKG monitoring obtained while on an atypical antipsychotic  BMI: 38.97  TSH:  Lab Results  Component Value Date   TSH 3.105 03/25/2023   Lipid Panel:  Lab Results  Component Value Date   CHOL 122 03/25/2023   HDL 47 03/25/2023   LDLCALC 53 03/25/2023   TRIG 112 03/25/2023   CHOLHDL 2.6 03/25/2023   HbgA1c: Lab Results  Component Value Date   HGBA1C 5.0 03/25/2023   QTc: 426 11/14              -- Encouraged patient to participate in unit milieu and in scheduled group therapies   -- Short Term Goals: Ability to identify changes in lifestyle to reduce recurrence of condition will improve, Ability to verbalize feelings will improve, Ability to disclose and discuss suicidal ideas, Ability to demonstrate self-control will improve, Ability to identify and develop effective coping behaviors will improve, and Ability to identify triggers associated with substance abuse/mental health issues will improve    -- Long Term Goals: Improvement in symptoms so as ready for discharge   Other PRNS:   -- start acetaminophen 650 mg every 6 hours as needed for mild to moderate pain, fever, and headaches              -- start hydroxyzine 25 mg three times a day as needed for anxiety   -- start aluminum-magnesium hydroxide + simethicone 30 mL every 4 hours as needed for heartburn            -- As needed agitation protocol in-place -- As needed agitation protocol in-place  Labs notable for hypokalemia  (K 3.2), Hgb 11.5 Cr 0.84, AST 15, ALT 9 Tylenol <10 UDS - Alc <10 QTc 426   3. Medical Issues Being Addressed:   #Hypokalemia s/p repletion    #History of diabetes A1c 5.0 Continue home metformin 500 AM, 1000mg  at bedtime    #History of PE  - continue eliquis 5mg  Q12H    #History of hypothyroidism -TSH wnl  -continue home synthroid    #Chronic constipation -continue home linzess   #Vit B12 deficiency -start vit B12 1000U daily  4. Discharge Planning:   -- Social work and case management to assist with discharge planning and identification of hospital follow-up needs prior to discharge  -- Estimated LOS: Fri 11/22 at 5:00 PM to [facility]  -- Discharge Concerns: Need to establish a safety plan; Medication compliance and effectiveness  -- Discharge Goals: Return home with outpatient referrals for mental health follow-up including medication management/psychotherapy   I certify that inpatient services furnished can reasonably be expected to improve the patient's condition.   This note was created using a voice recognition software as a result there may be grammatical errors inadvertently enclosed that do not reflect the nature of this encounter. Every attempt is made to correct such errors.   Dr. Lorri Frederick, MD PGY-2, Psychiatry Residency  11/20/202412:35 PM

## 2023-03-29 NOTE — Progress Notes (Signed)
   03/28/23 2145  Psych Admission Type (Psych Patients Only)  Admission Status Voluntary/72 hour document signed  Date 72 hour document signed  03/26/23  Psychosocial Assessment  Patient Complaints Anxiety  Eye Contact Fair  Facial Expression Anxious  Affect Appropriate to circumstance  Speech Logical/coherent  Interaction Assertive  Motor Activity Fidgety  Appearance/Hygiene Unremarkable  Behavior Characteristics Appropriate to situation  Mood Anxious  Thought Process  Coherency WDL  Content WDL  Delusions None reported or observed  Perception WDL  Hallucination None reported or observed  Judgment Limited  Confusion None  Danger to Self  Current suicidal ideation? Denies  Self-Injurious Behavior No self-injurious ideation or behavior indicators observed or expressed   Agreement Not to Harm Self Yes  Description of Agreement Verbal  Danger to Others  Danger to Others None reported or observed

## 2023-03-29 NOTE — Discharge Summary (Signed)
Physician Discharge Summary Note  Patient:  Holly [name] is an 27 y.o., female MRN:  [id number] DOB:  1995-11-09 Patient phone:  [phone] (home)  Patient address:   [address] [facility] [place] [address],  Total Time spent with patient: 20 minutes  Date of Admission:  03/24/2023 Date of Discharge: 03/31/23    Reason for Admission:   Holly [name] is a 27 y.o. female  with a past psychiatric history of ADHD, depression, intellectual disability, schizoaffective disorder, PTSD. Patient initially arrived to [facility] on 11/14 for self-harm with glass and suicidal ideations, and admitted to [facility] voluntarily on 11/16 for acute safety concerns. PPHx is significant for ADHD, depression, intellectual disability, schizoaffective disorder, PTSD, and history of multiple Suicide Attempts, Self Injurious Behavior, and 7 Prior Psychiatric Hospitalizations (2000, 2007x2, 2008, 2015, 2016, 2017x2). PMHx is significant for DM, hypothyroidism.     Principal Problem: Schizoaffective disorder, bipolar type [facility]) Discharge Diagnoses: Principal Problem:   Schizoaffective disorder, bipolar type (HCC) Active Problems:   Mild intellectual disability    Past Psychiatric Hx: Current Psychiatrist: Tamela Oddi Current Therapist: no therapist for 1.5 years Previous Psychiatric Diagnoses: schizoaffective disorder bipolar type, intellectual disability, ADHD, PTSD Current psychiatric medications:            Amantadine 100mg  BID, celexa 20 daily, lithobid 900 at bedtime, lybalvi 10-10 at bedtime, trazodone 150 at bedtime  Psychiatric medication history/compliance: -prozac 20, celexa 20, amantadine 100 BID, abilify 10, lithium 900 at bedtime, trazodone 300, cogentin 1, doxepin 25, zyprexa 15 [facility] 08/2015: Schizoaffective disorder bipolar type  Meds:  prozac 20, lithium 450 CR BID, zyprexa 15  Psychiatric Hospitalization hx: 7 Prior Psychiatric Hospitalizations (2000, 2007x2, 2008, 2015, 2016,  2017x2) Psychotherapy hx: history of seeing therapist Neuromodulation history: none History of suicide (obtained from HPI): multiple suicide attempts, most recently 2017 after ingesting 6-8 AA batteries as a suicidal gesture History of homicide or aggression (obtained in HPI): none   Substance Abuse Hx: (Frequency, quantity, last use, impact) Denies alcohol use or substance use history.   Past Medical History: PCP: unknown Medical Dx: History of PE 09/2022, history of diabetes on metformin  Medications: eliquis 5 Q12H, metformin Allergies: none Hospitalizations: Hospitalized for PE 09/2022 (d/c on eliquis for 6 months) Seizures: none   Contraceptives: none, was on depo-provera which was thought to be etiology of recent PE   Family Medical History: Family History  Adopted: Yes        Family Psychiatric History: Mother with alcoholism   Social History: Living Situation: Was living in ALF with princess. Has legal guardian.  Education: history of mild intellectual disability Occupational hx: reports she gets disability check from the state, has EBT  Marital Status: single  Children: denies  Legal: denies    Access to firearms: denies  Medication stockpile: reports her pills are in the cabinet, reports Princess is always there.   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   Obesity    PTSD (post-traumatic stress disorder)     Past Surgical History:  Procedure Laterality Date   IR ANGIOGRAM PULMONARY BILATERAL SELECTIVE  09/23/2022   IR THROMBECT PRIM MECH INIT (INCLU) MOD SED  09/23/2022   IR US GUIDE VASC ACCESS RIGHT  09/23/2022   NO PAST SURGERIES     None     Family History:  Family History  Adopted: Yes    Social History:  Social History   Substance and Sexual Activity  Alcohol Use No     Social History   Substance and Sexual Activity  Drug Use No    Social History   Socioeconomic History   Marital status: Single    Spouse name: Not on file   Number of children: Not on file   Years of education: Not on file   Highest education level: Not on file  Occupational History   Not on file  Tobacco Use   Smoking status: Never   Smokeless tobacco: Never  Vaping Use   Vaping status: Never Used  Substance and Sexual Activity   Alcohol use: No   Drug use: No   Sexual activity: Never    Birth control/protection: Injection  Other Topics Concern   Not on file  Social History Narrative   ** Merged History Encounter **       Social Determinants of Health   Financial Resource Strain: Not on file  Food Insecurity: No Food Insecurity (03/24/2023)   Hunger Vital Sign    Worried About Running Out of Food in the Last Year: Never true    Ran Out of Food in the Last Year: Never true  Transportation Needs: No Transportation Needs (03/24/2023)   PRAPARE - Administrator, Civil Service (Medical): No    Lack of Transportation (Non-Medical): No  Physical Activity: Not on file  Stress: Not on file  Social Connections: Not on file    Hospital Course:   During the patient's hospitalization, patient had extensive initial psychiatric evaluation, and follow-up psychiatric evaluations every day.   Psychiatric diagnoses provided upon initial assessment:  Schizoaffective disorder bipolar type, current episode depressed  History of mild intellectual disability    Patient's psychiatric medications were adjusted on admission:  Initially decreased home amantadine from 200 to 100mg  daily given no EPS -- Later discontinued due to no evidence fo EPS on physical exam. Continued home celexa 20mg  daily  Continued home Li ER 900mg  daily  -- Dierdre Searles level on 11/209/2024 - 0.32 Changed from home  lybalvi (zyprexa-samidorphan) to zyprexa 10mg  at bedtime given not on formulary  Continued home trazodone 150mg  at bedtime for insomnia     Patient's care was discussed during the interdisciplinary team meeting every day during the hospitalization.   The patient denies having side effects to prescribed psychiatric medication.   Gradually, patient started adjusting to milieu. The patient was evaluated each day by a clinical provider to ascertain response to treatment. Improvement was noted by the patient's report of decreasing symptoms, improved sleep and appetite, affect, medication tolerance, behavior, and participation in unit programming.  Patient was asked each day to complete a self inventory noting mood, mental status, pain, new symptoms, anxiety and concerns.     Symptoms were reported as significantly decreased or resolved completely by discharge.    On day of discharge, the patient reports that their mood is stable. The patient denied having suicidal thoughts for more than 48 hours prior to discharge.  Patient denies having homicidal thoughts.  Patient denies having auditory hallucinations.  Patient denies any visual hallucinations or other symptoms of psychosis. The patient was motivated to continue taking medication with a goal of continued improvement in mental health.  The patient reports their target psychiatric symptoms of depression, anxiety, and SI  responded well to the psychiatric medications, and the patient reports overall benefit other psychiatric hospitalization. Supportive psychotherapy was provided to the patient. The patient also participated in regular group therapy while hospitalized. Coping skills, problem solving as well as relaxation therapies were also part of the unit programming.   Labs were reviewed with the patient, and abnormal results were discussed with the patient.   The patient is able to verbalize their individual safety plan to this provider.   # It is  recommended to the patient to continue psychiatric medications as prescribed, after discharge from the hospital.     # It is recommended to the patient to follow up with your outpatient psychiatric provider and PCP.   # It was discussed with the patient, the impact of alcohol, drugs, tobacco have been there overall psychiatric and medical wellbeing, and total abstinence from substance use was recommended the patient.ed.   # Prescriptions provided or sent directly to preferred pharmacy at discharge. Patient agreeable to plan. Given opportunity to ask questions. Appears to feel comfortable with discharge.    # In the event of worsening symptoms, the patient is instructed to call the crisis hotline, 911 and or go to the nearest ED for appropriate evaluation and treatment of symptoms. To follow-up with primary care provider for other medical issues, concerns and or health care needs   # Patient was discharged Home-AFL with a plan to follow up as noted below.    Musculoskeletal: Strength & Muscle Tone: within normal limits Gait & Station: normal Patient leans: N/A   Psychiatric Specialty Exam:   Presentation  General Appearance: Fairly groomed   Eye Contact: Fair   Education administrator Volume:Normal   Handedness:Not assessed     Mood and Affect  Mood: "Good"   Affect:Congruent     Thought Process  Thought Processes:Coherent   Descriptions of Associations:Intact   Orientation: Grossly intact   Thought Content:Logical   History of Schizophrenia/Schizoaffective disorder:Yes   Duration of Psychotic Symptoms:Greater than six months   Hallucinations: None Ideas of Reference:None   Suicidal Thoughts: None Homicidal Thoughts: None   Sensorium  Memory:Immediate Fair; Recent Fair; Remote Fair   Judgment:Poor   Insight:Poor     Executive Functions  Concentration:Fair   Attention Span:Fair   Recall:Fair   Fund of Knowledge:Fair   Language:Fair      Psychomotor Activity  Psychomotor Activity: Normal   Assets  Assets:Desire for Improvement; Social Support; Leisure Time     Sleep  Sleep:Good   Physical Exam: Physical Exam Vitals reviewed.  HENT:     Head: Normocephalic and atraumatic.  Pulmonary:     Effort: Pulmonary effort is normal. No respiratory distress.  Skin:    General: Skin is warm and dry.      Review of Systems  Constitutional: Negative.   Cardiovascular: Negative.   Gastrointestinal: Negative.   All other systems reviewed and are negative.   Blood pressure 121/80, pulse (!) 105, temperature 97.9 F (36.6 C), temperature source Oral, resp. rate 18, height 5\' 3"  (1.6 m), weight 99.8 kg, SpO2 100%. Body mass index is 38.97 kg/m.   Social History   Tobacco Use  Smoking Status Never  Smokeless Tobacco Never   Tobacco Cessation:  N/A, patient does not currently use tobacco products   Blood Alcohol level:  Lab Results  Component Value Date   ETH <10 03/23/2023   ETH <10  02/23/2023    Metabolic Disorder Labs:  Lab Results  Component Value Date   HGBA1C 5.0 03/25/2023   MPG 96.8 03/25/2023   MPG 102.54 09/23/2022   Lab Results  Component Value Date   PROLACTIN 48.3 (H) 08/21/2015   Lab Results  Component Value Date   CHOL 122 03/25/2023   TRIG 112 03/25/2023   HDL 47 03/25/2023   CHOLHDL 2.6 03/25/2023   VLDL 22 03/25/2023   LDLCALC 53 03/25/2023   LDLCALC 44 08/21/2015    See Psychiatric Specialty Exam and Suicide Risk Assessment completed by Attending Physician prior to discharge.  Discharge destination:  Home  Is patient on multiple antipsychotic therapies at discharge:  No   Has Patient had three or more failed trials of antipsychotic monotherapy by history:  No  Recommended Plan for Multiple Antipsychotic Therapies: NA   Allergies as of 03/31/2023   No Active Allergies      Medication List     STOP taking these medications    amantadine 100 MG  capsule Commonly known as: SYMMETREL       TAKE these medications      Indication  acetaminophen 325 MG tablet Commonly known as: TYLENOL Take 2 tablets (650 mg total) by mouth every 4 (four) hours as needed for fever or mild pain.  Indication: Fever, Pain   apixaban 5 MG Tabs tablet Commonly known as: Eliquis Take 1 tablet (5 mg total) by mouth 2 (two) times daily.  Indication: Blockage of Blood Vessel to Lung by a Particle   citalopram 20 MG tablet Commonly known as: CELEXA Take 1 tablet (20 mg total) by mouth daily.  Indication: depressive phase of schizoaffective disorder bipolar type   cyanocobalamin 1000 MCG tablet Take 1 tablet (1,000 mcg total) by mouth daily.  Indication: Inadequate Vitamin B12   levothyroxine 100 MCG tablet Commonly known as: SYNTHROID Take 1 tablet (100 mcg total) by mouth daily before breakfast for 7 days. For low thyroid function What changed:  when to take this additional instructions  Indication: Underactive Thyroid   Linzess 290 MCG Caps capsule Generic drug: linaclotide Take 290 mcg by mouth daily as needed (GI upset).  Indication: GI upset   lithium carbonate 300 MG ER tablet Commonly known as: LITHOBID Take 4 tablets (1,200 mg total) by mouth at bedtime. What changed:  how much to take additional instructions  Indication: Manic-Depression, Schizoaffective Disorder, Mood Stabilization   Lybalvi 10-10 MG Tabs Generic drug: OLANZapine-Samidorphan Take 1 tablet by mouth at bedtime.  Indication: Manic-Depression   metFORMIN 1000 MG tablet Commonly known as: GLUCOPHAGE Take 1,000 mg by mouth at bedtime.  Indication: Type 2 Diabetes   metFORMIN 500 MG tablet Commonly known as: GLUCOPHAGE Take 500 mg by mouth daily.  Indication: Type 2 Diabetes   omeprazole 20 MG capsule Commonly known as: PRILOSEC Take 20 mg by mouth daily.  Indication: Heartburn   traZODone 150 MG tablet Commonly known as: DESYREL Take 1 tablet (150  mg total) by mouth at bedtime.  Indication: Trouble Sleeping         Follow-up Information     Tamela Oddi, [place]. Go on 04/05/2023.   Specialty: Physician Assistant Why: You have an appointment for medication management services on 04/05/23 at 9:40 am, in person. Contact information: [address] [place] [address] [phone]         [facility] Go to.   Why: Please go to this provider on Monday 11/25 at  9:00AM to establish care for group therapy. Otherwise, you may go Monday through Friday from 9 am to 3 pm to establish care. Contact information: [address] [place] [address] [phone]                Plan Of Care/Follow-up recommendations:  Activity: as tolerated   Diet: heart healthy   Other: -Follow-up with your outpatient psychiatric provider -instructions on appointment date, time, and address (location) are provided to you in discharge paperwork.   -Take your psychiatric medications as prescribed at discharge - instructions are provided to you in the discharge paperwork   -Follow-up with outpatient primary care doctor and other specialists -for management of preventative medicine and chronic medical disease, including:  #Hypokalemia s/p repletion    #History of diabetes A1c 5.0 Continue home metformin 500 AM, 1000mg  at bedtime    #History of PE  - continue eliquis 5mg  Q12H    #History of hypothyroidism -TSH wnl  -continue home synthroid    #Chronic constipation -continue home linzess    #Vit B12 deficiency -start vit B12 1000U daily   -Testing: Follow-up with outpatient provider for abnormal lab results:    Lithium resulting subtherapeutic (0.32 on 03/29/2023) -- will require continued titration in outpatient setting   -Recommend abstinence from alcohol, tobacco, and other illicit drug use at discharge.    -If your psychiatric symptoms recur, worsen, or if you have side effects to your  psychiatric medications, call your outpatient psychiatric provider, 911, [phone] or go to the nearest emergency department.   -If suicidal thoughts recur, call your outpatient psychiatric provider, 911, [phone] or go to the nearest emergency department.   Signed: Dr. Liston Alba, MD PGY-2, Psychiatry Residency  03/31/2023, 4:00 PM

## 2023-03-29 NOTE — Progress Notes (Signed)
   03/29/23 0545  15 Minute Checks  Location Bedroom  Visual Appearance Calm  Behavior Sleeping  Sleep (Behavioral Health Patients Only)  Calculate sleep? (Click Yes once per 24 hr at 0600 safety check) Yes  Documented sleep last 24 hours 8.25

## 2023-03-29 NOTE — Group Note (Signed)
Recreation Therapy Group Note   Group Topic:Team Building  Group Date: 03/29/2023 Start Time: 0935 End Time: 1002 Facilitators: Hovanes Hymas-McCall, LRT,CTRS Location: [address] Dayroom   Group Topic: Communication, Team Building, Problem Solving  Goal Area(s) Addresses:  Patient will effectively work with peer towards shared goal.  Patient will identify skills used to make activity successful.  Patient will identify how skills used during activity can be applied to reach post d/c goals.   Intervention: STEM Activity- Glass blower/designer  Group Description: Tallest Pharmacist, community. In teams of 5-6, patients were given 11 craft pipe cleaners. Using the materials provided, patients were instructed to compete again the opposing team(s) to build the tallest free-standing structure from floor level. The activity was timed; difficulty increased by Clinical research associate as Production designer, theatre/television/film continued.  Systematically resources were removed with additional directions for example, placing one arm behind their back, working in silence, and shape stipulations. LRT facilitated post-activity discussion reviewing team processes and necessary communication skills involved in completion. Patients were encouraged to reflect how the skills utilized, or not utilized, in this activity can be incorporated to positively impact support systems post discharge.  Education: Pharmacist, community, Scientist, physiological, Discharge Planning   Education Outcome: Acknowledges education/In group clarification offered/Needs additional education.    Affect/Mood: N/A   Participation Level: Did not attend    Clinical Observations/Individualized Feedback:     Plan: Continue to engage patient in RT group sessions 2-3x/week.   Lowen Barringer-McCall, LRT,CTRS 03/29/2023 12:10 PM
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
## 2023-03-29 NOTE — Plan of Care (Signed)
  Problem: Coping: Goal: Ability to verbalize frustrations and anger appropriately will improve Outcome: Progressing   Problem: Coping: Goal: Ability to demonstrate self-control will improve Outcome: Progressing   Problem: Safety: Goal: Periods of time without injury will increase Outcome: Progressing   

## 2023-03-29 NOTE — Progress Notes (Signed)
D) Pt received calm, visible, participating in milieu, and in no acute distress. Pt A & O x4. Pt denies SI, HI, A/ V H, depression, anxiety and pain at this time. A) Pt encouraged to drink fluids. Pt encouraged to come to staff with needs. Pt encouraged to attend and participate in groups. Pt encouraged to set reachable goals.  R) Pt remained safe on unit, in no acute distress, will continue to assess.     03/29/23 2300  Psych Admission Type (Psych Patients Only)  Admission Status Voluntary/72 hour document signed  Psychosocial Assessment  Patient Complaints None  Eye Contact Fair  Facial Expression Anxious  Affect Appropriate to circumstance  Speech Logical/coherent  Interaction Assertive  Motor Activity Fidgety  Appearance/Hygiene Unremarkable  Behavior Characteristics Appropriate to situation  Mood Pleasant  Thought Process  Coherency WDL  Content WDL  Delusions None reported or observed  Perception WDL  Hallucination None reported or observed  Judgment Limited  Confusion None  Danger to Self  Current suicidal ideation? Denies  Self-Injurious Behavior No self-injurious ideation or behavior indicators observed or expressed   Agreement Not to Harm Self Yes  Description of Agreement verbal  Danger to Others  Danger to Others None reported or observed

## 2023-03-29 NOTE — [facility] Group Notes (Signed)
[facility] Group Notes:  (Nursing/MHT/Case Management/Adjunct)  Date:  03/29/2023  Time:  9:21 PM  Type of Therapy:   NA group  Participation Level:  Active  Participation Quality:  Appropriate  Affect:  Appropriate  Cognitive:  Appropriate  Insight:  Appropriate  Engagement in Group:  Engaged  Modes of Intervention:  Education  Summary of Progress/Problems: Attended NA meeting.  Holly [name] 03/29/2023, 9:21 PM

## 2023-03-29 NOTE — Progress Notes (Signed)
   03/29/23 0816  Psych Admission Type (Psych Patients Only)  Admission Status Voluntary/72 hour document signed  Psychosocial Assessment  Patient Complaints Anxiety  Eye Contact Fair  Facial Expression Anxious  Affect Appropriate to circumstance  Speech Logical/coherent  Interaction Assertive  Motor Activity Fidgety  Appearance/Hygiene Unremarkable  Behavior Characteristics Appropriate to situation  Mood Anxious  Thought Process  Coherency WDL  Content WDL  Delusions None reported or observed  Perception WDL  Hallucination None reported or observed  Judgment Limited  Confusion None  Danger to Self  Current suicidal ideation? Denies  Self-Injurious Behavior No self-injurious ideation or behavior indicators observed or expressed   Agreement Not to Harm Self Yes  Description of Agreement Verbal  Danger to Others  Danger to Others None reported or observed

## 2023-03-30 NOTE — Progress Notes (Signed)
Patient rated her depression level 0/10 and her anxiety level 7/10 with 10 being the highest and 0 none. Pt is medication and group compliant. Observed interacting well with peers. Pt denied SI/HI and AVH. Appetite good on shift. Safety maintained.  03/30/23 0808  Psych Admission Type (Psych Patients Only)  Admission Status Voluntary/72 hour document signed  Psychosocial Assessment  Patient Complaints Anxiety  Eye Contact Fair  Facial Expression Anxious  Affect Appropriate to circumstance  Speech Logical/coherent  Interaction Assertive  Motor Activity Fidgety  Appearance/Hygiene Unremarkable  Behavior Characteristics Appropriate to situation  Mood Anxious;Pleasant  Thought Process  Coherency WDL  Content WDL  Delusions None reported or observed  Perception WDL  Hallucination None reported or observed  Judgment Limited  Confusion None  Danger to Self  Current suicidal ideation? Denies  Self-Injurious Behavior No self-injurious ideation or behavior indicators observed or expressed   Agreement Not to Harm Self Yes  Description of Agreement Verbal  Danger to Others  Danger to Others None reported or observed

## 2023-03-30 NOTE — Plan of Care (Signed)
  Problem: Activity: Goal: Interest or engagement in activities will improve Outcome: Progressing Goal: Sleeping patterns will improve Outcome: Progressing   Problem: Coping: Goal: Ability to verbalize frustrations and anger appropriately will improve Outcome: Progressing Goal: Ability to demonstrate self-control will improve Outcome: Progressing   Problem: Safety: Goal: Periods of time without injury will increase Outcome: Progressing   Problem: Education: Goal: Ability to make informed decisions regarding treatment will improve Outcome: Progressing

## 2023-03-30 NOTE — [facility] Group Notes (Signed)
[facility] Group Notes:  (Nursing/MHT/Case Management/Adjunct)  Date:  03/30/2023  Time:  10:33 PM  Type of Therapy:   Wrap-up group  Participation Level:  Active  Participation Quality:  Appropriate  Affect:  Appropriate  Cognitive:  Appropriate  Insight:  Appropriate  Engagement in Group:  Engaged  Modes of Intervention:  Education  Summary of Progress/Problems:Goal to get D/C date. Rated day 10/10.  Holly [name] 03/30/2023, 10:33 PM

## 2023-03-30 NOTE — Group Note (Signed)
LCSW Group Therapy Note   Group Date: 03/30/2023 Start Time: 1100 End Time: 1200   Type of Therapy and Topic:  Group Therapy: Boundaries  Participation Level:  Did Not Attend  Description of Group: This group will address the use of boundaries in their personal lives. Patients will explore why boundaries are important, the difference between healthy and unhealthy boundaries, and negative and postive outcomes of different boundaries and will look at how boundaries can be crossed.  Patients will be encouraged to identify current boundaries in their own lives and identify what kind of boundary is being set. Facilitators will guide patients in utilizing problem-solving interventions to address and correct types boundaries being used and to address when no boundary is being used. Understanding and applying boundaries will be explored and addressed for obtaining and maintaining a balanced life. Patients will be encouraged to explore ways to assertively make their boundaries and needs known to significant others in their lives, using other group members and facilitator for role play, support, and feedback.  Therapeutic Goals:  1.  Patient will identify areas in their life where setting clear boundaries could be  used to improve their life.  2.  Patient will identify signs/triggers that a boundary is not being respected. 3.  Patient will identify two ways to set boundaries in order to achieve balance in  their lives: 4.  Patient will demonstrate ability to communicate their needs and set boundaries  through discussion and/or role plays  Summary of Patient Progress:  Did not attend  Therapeutic Modalities:   Cognitive Behavioral Therapy Solution-Focused Therapy  Kathi Der, LCSWA 03/30/2023  12:07 PM

## 2023-03-30 NOTE — Group Note (Signed)
Date:  03/30/2023 Time:  4:19 PM  Group Topic/Focus:  Coping With Mental Health Crisis:   The purpose of this group is to help patients identify strategies for coping with mental health crisis.     Participation Level:  Active  Participation Quality:  Appropriate  Affect:  Appropriate  Cognitive:  Appropriate  Insight: Appropriate  Engagement in Group:  Engaged  Modes of Intervention:  Education  Additional Comments:    Arnoldo Hooker 03/30/2023, 4:19 PM

## 2023-03-30 NOTE — Progress Notes (Signed)
[facility] MD Progress Note  03/30/2023 7:13 AM Holly [name]  MRN:  [id number]  Principal Problem: Schizoaffective disorder, bipolar type (HCC) Diagnosis: Principal Problem:   Schizoaffective disorder, bipolar type (HCC) Active Problems:   Mild intellectual disability  Reason for Admission:  Holly [name] is a 27 y.o. female  with a past psychiatric history of ADHD, depression, intellectual disability, schizoaffective disorder, PTSD. Patient initially arrived to [facility] on 11/14 for self-harm with glass and suicidal ideations, and admitted to [facility] voluntarily on 11/16 for acute safety concerns. PPHx is significant for ADHD, depression, intellectual disability, schizoaffective disorder, PTSD, and history of multiple Suicide Attempts, Self Injurious Behavior, and 7 Prior Psychiatric Hospitalizations (2000, 2007x2, 2008, 2015, 2016, 2017x2). PMHx is significant for DM, hypothyroidism.   (admitted on 03/24/2023, total  LOS: 6 days )  Pertinent information discussed during bed progression:  Planning for discharge back to [facility] tomorrow  Information Obtained Today During Patient Interview: Patient evaluated at bedside.  She reports doing well today, adequate sleep and appetite.  She is looking forward to her discharge tomorrow.  She continues to participate in unit milieu, reports she is going to group therapy sessions.  On interview, suicidal ideations are not present . Homicidal ideations are not present.   There are no auditory hallucinations, visual hallucinations, paranoid ideations, or delusional thought processes.  There are no somatic complaints. Reports regular bowel movements.     Collateral Call on 03/27/2023: [name] [phone] -- ALF QP and Vice president  She reports the patient can return to [facility] on Wednesday the earliest.  She has no acute safety concerns.  She request the patient be connected with outpatient therapy services at discharge.  She reports the patient has  history of manipulative behavior, wanted provider to be made aware that she has been requesting for her legal guardian to be changed.   Past Psychiatric History:  Current Psychiatrist: Tamela Oddi Current Therapist: no therapist for 1.5 years Previous Psychiatric Diagnoses: schizoaffective disorder bipolar type, intellectual disability, ADHD, PTSD Current psychiatric medications:            Amantadine 100mg  BID, celexa 20 daily, lithobid 900 at bedtime, lybalvi 10-10 at bedtime, trazodone 150 at bedtime  Psychiatric medication history/compliance: -prozac 20, celexa 20, amantadine 100 BID, abilify 10, lithium 900 at bedtime, trazodone 300, cogentin 1, doxepin 25, zyprexa 15 [facility] 08/2015: Schizoaffective disorder bipolar type  Meds:  prozac 20, lithium 450 CR BID, zyprexa 15  Psychiatric Hospitalization hx: 7 Prior Psychiatric Hospitalizations (2000, 2007x2, 2008, 2015, 2016, 2017x2) Psychotherapy hx: history of seeing therapist Neuromodulation history: none History of suicide (obtained from HPI): multiple suicide attempts, most recently 2017 after ingesting 6-8 AA batteries as a suicidal gesture History of homicide or aggression (obtained in HPI): none  Family Psychiatric History:  Mother with alcoholism   Social History:  Living Situation: Was living in ALF with princess. Has legal guardian.  Education: history of mild intellectual disability Occupational hx: reports she gets disability check from the state, has EBT  Marital Status: single  Children: denies  Legal: denies   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   Obesity    PTSD (  post-traumatic stress disorder)    Family History:  Family History  Adopted: Yes     Current Medications: Current Facility-Administered Medications  Medication Dose Route Frequency Provider Last Rate Last Admin   acetaminophen (TYLENOL) tablet 650 mg  650 mg Oral Q6H PRN Motley-Mangrum, Jadeka A, PMHNP   650 mg at 03/28/23 1628   alum & mag hydroxide-simeth (MAALOX/MYLANTA) 200-200-20 MG/5ML suspension 30 mL  30 mL Oral Q4H PRN Motley-Mangrum, Jadeka A, PMHNP       apixaban (ELIQUIS) tablet 5 mg  5 mg Oral Q12H Green, Terri L, RPH   5 mg at 03/29/23 2119   citalopram (CELEXA) tablet 20 mg  20 mg Oral Daily Motley-Mangrum, Jadeka A, PMHNP   20 mg at 03/29/23 0746   cyanocobalamin (VITAMIN B12) tablet 1,000 mcg  1,000 mcg Oral Daily Karie Fetch, MD   1,000 mcg at 03/29/23 0746   haloperidol (HALDOL) tablet 5 mg  5 mg Oral TID PRN Motley-Mangrum, Jadeka A, PMHNP       And   diphenhydrAMINE (BENADRYL) capsule 50 mg  50 mg Oral TID PRN Motley-Mangrum, Geralynn Ochs A, PMHNP       hydrocerin (EUCERIN) cream   Topical Daily PRN Karie Fetch, MD       hydrOXYzine (ATARAX) tablet 25 mg  25 mg Oral TID PRN Motley-Mangrum, Jadeka A, PMHNP   25 mg at 03/29/23 0454   levothyroxine (SYNTHROID) tablet 100 mcg  100 mcg Oral Q0600 Motley-Mangrum, Jadeka A, PMHNP   100 mcg at 03/30/23 0622   linaclotide (LINZESS) capsule 290 mcg  290 mcg Oral Daily Motley-Mangrum, Jadeka A, PMHNP   290 mcg at 03/29/23 0747   lithium carbonate (LITHOBID) ER tablet 1,200 mg  1,200 mg Oral QHS Karie Fetch, MD   1,200 mg at 03/29/23 2119   magnesium hydroxide (MILK OF MAGNESIA) suspension 30 mL  30 mL Oral Daily PRN Motley-Mangrum, Jadeka A, PMHNP       metFORMIN (GLUCOPHAGE) tablet 1,000 mg  1,000 mg Oral Q supper Motley-Mangrum, Jadeka A, PMHNP   1,000 mg at 03/29/23 1828   metFORMIN (GLUCOPHAGE) tablet 500 mg  500 mg Oral Daily Motley-Mangrum, Jadeka A, PMHNP   500 mg at 03/29/23 0746   OLANZapine (ZYPREXA) tablet 10 mg  10 mg Oral QHS Karie Fetch, MD   10 mg at 03/29/23 2119   ondansetron  (ZOFRAN-ODT) disintegrating tablet 4 mg  4 mg Oral Q8H PRN Carrion-Carrero, Karle Starch, MD       traZODone (DESYREL) tablet 150 mg  150 mg Oral QHS Motley-Mangrum, Jadeka A, PMHNP   150 mg at 03/29/23 2118   traZODone (DESYREL) tablet 50 mg  50 mg Oral QHS PRN Motley-Mangrum, Jadeka A, PMHNP   50 mg at 03/26/23 2255    Lab Results:  Results for orders placed or performed during the hospital encounter of 03/24/23 (from the past 48 hour(s))  Lithium level     Status: Abnormal   Collection Time: 03/29/23  6:40 PM  Result Value Ref Range   Lithium Lvl 0.32 (L) 0.60 - 1.20 mmol/L    Comment: Performed at [facility], [address] [place], [place] [address]     Blood Alcohol level:  Lab Results  Component Value Date   [facility] <10 03/23/2023   ETH <10 02/23/2023    Metabolic Labs: Lab Results  Component Value Date   HGBA1C 5.0 03/25/2023   MPG 96.8 03/25/2023   MPG 102.54 09/23/2022   Lab Results  Component Value Date  PROLACTIN 48.3 (H) 08/21/2015   Lab Results  Component Value Date   CHOL 122 03/25/2023   TRIG 112 03/25/2023   HDL 47 03/25/2023   CHOLHDL 2.6 03/25/2023   VLDL 22 03/25/2023   LDLCALC 53 03/25/2023   LDLCALC 44 08/21/2015    Sleep: Fair  Physical Findings:  Psychiatric Specialty Exam:  Presentation  General Appearance: Fairly groomed  Eye Contact: Good  Speech:Clear and Coherent  Speech Volume:Normal  Handedness:Not assessed   Mood and Affect  Mood: "Good"  Affect:Congruent   Thought Process  Thought Processes:Coherent  Descriptions of Associations:Intact  Orientation: Grossly intact  Thought Content:Logical  History of Schizophrenia/Schizoaffective disorder:Yes  Duration of Psychotic Symptoms:Greater than six months  Hallucinations: Denies Ideas of Reference:None  Suicidal Thoughts: Denies Homicidal Thoughts: Denies  Sensorium  Memory:Immediate Fair; Recent Fair; Remote Fair  Judgment:  Fair  Insight: Limited   Executive Functions  Concentration:Fair  Attention Span:Fair  Recall:Fair  Fund of Knowledge:Fair  Language:Fair   Psychomotor Activity  Psychomotor Activity: Normal  Assets  Assets:Desire for Improvement; Social Support; Leisure Time   Sleep  Sleep:Good   Physical Exam Constitutional:      Appearance: the patient is not toxic-appearing.  Pulmonary:     Effort: Pulmonary effort is normal.  Neurological:     General: No focal deficit present.     Mental Status: the patient is alert and oriented to person, place, and time.   Review of Systems  Respiratory:  Negative for shortness of breath.   Cardiovascular:  Negative for chest pain.  Gastrointestinal:  Negative for abdominal pain, constipation, diarrhea, nausea and vomiting.  Neurological:  Negative for headaches. Reporting lightheadedness this AM.   Blood pressure (!) 123/113, pulse 91, temperature 98.1 F (36.7 C), temperature source Oral, resp. rate 16, height 5\' 3"  (1.6 m), weight 99.8 kg, SpO2 100%. Body mass index is 38.97 kg/m.  Treatment Plan Summary: Daily contact with patient to assess and evaluate symptoms and progress in treatment, Medication management, and Plan    ASSESSMENT: Holly [name] is a 27 y.o. female  with a past psychiatric history of ADHD, depression, intellectual disability, schizoaffective disorder, PTSD. Patient initially arrived to [facility] on 11/14 for self-harm with glass and suicidal ideations, and admitted to [facility] voluntarily on 11/16 for acute safety concerns. PPHx is significant for ADHD, depression, intellectual disability, schizoaffective disorder, PTSD, and history of multiple Suicide Attempts, Self Injurious Behavior, and 7 Prior Psychiatric Hospitalizations (2000, 2007x2, 2008, 2015, 2016, 2017x2). PMHx is significant for DM, hypothyroidism.    Diagnoses / Active Problems: Schizoaffective disorder bipolar type, current episode depressed  History of  mild intellectual disability   PLAN: Safety and Monitoring:   --  VOLUNTARY  admission to inpatient psychiatric unit for safety, stabilization and treatment            -- Daily contact with patient to assess and evaluate symptoms and progress in treatment            -- Patient's case to be discussed in multi-disciplinary team meeting            -- Observation Level : q15 minute checks            -- Vital signs:  q12 hours            -- Precautions: suicide, elopement, and assault  2. Psychiatric Diagnoses and Treatment:  --d/c home amantadine -- Continue home celexa 20mg  daily for mood stability  --Continue Li ER 1200mg  daily for mood  stability  Li level 0.36 11/16 Repeat Li level 11/20 - pending --continue home zyprexa 10mg  at bedtime for mood stability  -- continue home trazodone 150mg  at bedtime for insomnia  -- The risks/benefits/side-effects/alternatives to this medication were discussed in detail with the patient and time was given for questions. The patient consents to medication trial.              -- Metabolic profile and EKG monitoring obtained while on an atypical antipsychotic  BMI: 38.97  TSH:  Lab Results  Component Value Date   TSH 3.105 03/25/2023   Lipid Panel:  Lab Results  Component Value Date   CHOL 122 03/25/2023   HDL 47 03/25/2023   LDLCALC 53 03/25/2023   TRIG 112 03/25/2023   CHOLHDL 2.6 03/25/2023   HbgA1c: Lab Results  Component Value Date   HGBA1C 5.0 03/25/2023   QTc: 426 11/14              -- Encouraged patient to participate in unit milieu and in scheduled group therapies   -- Short Term Goals: Ability to identify changes in lifestyle to reduce recurrence of condition will improve, Ability to verbalize feelings will improve, Ability to disclose and discuss suicidal ideas, Ability to demonstrate self-control will improve, Ability to identify and develop effective coping behaviors will improve, and Ability to identify triggers associated with  substance abuse/mental health issues will improve   -- Long Term Goals: Improvement in symptoms so as ready for discharge   Other PRNS:   -- start acetaminophen 650 mg every 6 hours as needed for mild to moderate pain, fever, and headaches              -- start hydroxyzine 25 mg three times a day as needed for anxiety   -- start aluminum-magnesium hydroxide + simethicone 30 mL every 4 hours as needed for heartburn            -- As needed agitation protocol in-place -- As needed agitation protocol in-place  Labs notable for hypokalemia  (K 3.2), Hgb 11.5 Cr 0.84, AST 15, ALT 9 Tylenol <10 UDS - Alc <10 QTc 426   3. Medical Issues Being Addressed:   #Hypokalemia s/p repletion    #History of diabetes A1c 5.0 Continue home metformin 500 AM, 1000mg  at bedtime    #History of PE  - continue eliquis 5mg  Q12H    #History of hypothyroidism -TSH wnl  -continue home synthroid    #Chronic constipation -continue home linzess   #Vit B12 deficiency -start vit B12 1000U daily  4. Discharge Planning:   -- Social work and case management to assist with discharge planning and identification of hospital follow-up needs prior to discharge  -- Estimated LOS: Fri 11/22 at 5:00 PM to [facility]  -- Discharge Concerns: Need to establish a safety plan; Medication compliance and effectiveness  -- Discharge Goals: Return home with outpatient referrals for mental health follow-up including medication management/psychotherapy   I certify that inpatient services furnished can reasonably be expected to improve the patient's condition.   This note was created using a voice recognition software as a result there may be grammatical errors inadvertently enclosed that do not reflect the nature of this encounter. Every attempt is made to correct such errors.   Dr. Lorri Frederick, MD PGY-2, Psychiatry Residency  11/21/20247:13 AM

## 2023-03-31 ENCOUNTER — Encounter (HOSPITAL_COMMUNITY): Payer: Self-pay

## 2023-03-31 ENCOUNTER — Encounter (HOSPITAL_COMMUNITY): Payer: Self-pay | Admitting: Psychiatry

## 2023-03-31 MED ORDER — CYANOCOBALAMIN 1000 MCG PO TABS: 1000.0000 ug | ORAL_TABLET | Freq: Every day | ORAL | Status: DC

## 2023-03-31 MED ORDER — APIXABAN 5 MG PO TABS: 5.0000 mg | ORAL_TABLET | Freq: Two times a day (BID) | ORAL | 0 refills | Status: DC

## 2023-03-31 MED ORDER — LITHIUM CARBONATE ER 300 MG PO TBCR: 1200.0000 mg | EXTENDED_RELEASE_TABLET | Freq: Every day | ORAL | 0 refills | Status: DC

## 2023-03-31 MED ORDER — TRAZODONE HCL 150 MG PO TABS: 150.0000 mg | ORAL_TABLET | Freq: Every day | ORAL | 0 refills | Status: DC

## 2023-03-31 MED ORDER — LYBALVI 10-10 MG PO TABS: 1.0000 | ORAL_TABLET | Freq: Every day | ORAL | 0 refills | Status: DC

## 2023-03-31 MED ORDER — CITALOPRAM HYDROBROMIDE 20 MG PO TABS: 20.0000 mg | ORAL_TABLET | Freq: Every day | ORAL | 0 refills | Status: DC

## 2023-03-31 NOTE — Progress Notes (Signed)
  [facility] Adult Case Management Discharge Plan :  Will you be returning to the same living situation after discharge:  Yes,  pt will be returning back to [facility] At discharge, do you have transportation home?: Yes,  pt will be picked up at 5:00PM by Vincente Liberty , QP at [facility] Do you have the ability to pay for your medications: Yes,  pt gets SSDI check and has [employer] plan MCD  Release of information consent forms completed and in the chart;  Patient's signature needed at discharge.  Patient to Follow up at:  Follow-up Information     Tamela Oddi, PA-C. Go on 04/05/2023.   Specialty: Physician Assistant Why: You have an appointment for medication management services on 04/05/23 at 9:40 am, in person. Contact information: [address] [place] [address] [phone]         [facility] Go to.   Why: Please go to this provider on Monday 11/25 at 9:00AM to establish care for group therapy. Otherwise, you may go Monday through Friday from 9 am to 3 pm to establish care. Contact information: [address] [place] [address] [phone]                 Next level of care provider has access to [facility] Link:no  Safety Planning and Suicide Prevention discussed: Yes,  [name] (legal guardian) [phone] AND [name] (QP at [facility]) [phone]     Has patient been referred to the Quitline?: Patient does not use tobacco/nicotine products. Per patient report.  Patient has been referred for addiction treatment: No known substance use disorder.  Kathi Der, LCSWA 03/31/2023, 9:56 AM

## 2023-03-31 NOTE — BH IP Treatment Plan (Signed)
Interdisciplinary Treatment and Diagnostic Plan Update  03/31/2023 Time of Session: 11:35AM UPDATE Holly [name] MRN: [id number]  Principal Diagnosis: Schizoaffective disorder, bipolar type (HCC)  Secondary Diagnoses: Principal Problem:   Schizoaffective disorder, bipolar type (HCC) Active Problems:   Mild intellectual disability   Current Medications:  Current Facility-Administered Medications  Medication Dose Route Frequency Provider Last Rate Last Admin   acetaminophen (TYLENOL) tablet 650 mg  650 mg Oral Q6H PRN Motley-Mangrum, Jadeka A, PMHNP   650 mg at 03/30/23 1623   alum & mag hydroxide-simeth (MAALOX/MYLANTA) 200-200-20 MG/5ML suspension 30 mL  30 mL Oral Q4H PRN Motley-Mangrum, Jadeka A, PMHNP   30 mL at 03/30/23 1919   apixaban (ELIQUIS) tablet 5 mg  5 mg Oral Q12H Green, Terri L, RPH   5 mg at 03/31/23 1001   citalopram (CELEXA) tablet 20 mg  20 mg Oral Daily Motley-Mangrum, Jadeka A, PMHNP   20 mg at 03/31/23 1001   cyanocobalamin (VITAMIN B12) tablet 1,000 mcg  1,000 mcg Oral Daily Karie Fetch, MD   1,000 mcg at 03/31/23 1000   haloperidol (HALDOL) tablet 5 mg  5 mg Oral TID PRN Motley-Mangrum, Jadeka A, PMHNP       And   diphenhydrAMINE (BENADRYL) capsule 50 mg  50 mg Oral TID PRN Motley-Mangrum, Ezra Sites, PMHNP       hydrocerin (EUCERIN) cream   Topical Daily PRN Karie Fetch, MD       hydrOXYzine (ATARAX) tablet 25 mg  25 mg Oral TID PRN Motley-Mangrum, Jadeka A, PMHNP   25 mg at 03/29/23 0454   levothyroxine (SYNTHROID) tablet 100 mcg  100 mcg Oral Q0600 Motley-Mangrum, Jadeka A, PMHNP   100 mcg at 03/31/23 0634   linaclotide (LINZESS) capsule 290 mcg  290 mcg Oral Daily Motley-Mangrum, Jadeka A, PMHNP   290 mcg at 03/30/23 0752   lithium carbonate (LITHOBID) ER tablet 1,200 mg  1,200 mg Oral QHS Karie Fetch, MD   1,200 mg at 03/30/23 2107   magnesium hydroxide (MILK OF MAGNESIA) suspension 30 mL  30 mL Oral Daily PRN Motley-Mangrum, Jadeka A, PMHNP        metFORMIN (GLUCOPHAGE) tablet 1,000 mg  1,000 mg Oral Q supper Motley-Mangrum, Jadeka A, PMHNP   1,000 mg at 03/30/23 1808   metFORMIN (GLUCOPHAGE) tablet 500 mg  500 mg Oral Daily Motley-Mangrum, Jadeka A, PMHNP   500 mg at 03/30/23 0752   OLANZapine (ZYPREXA) tablet 10 mg  10 mg Oral QHS Karie Fetch, MD   10 mg at 03/30/23 2108   ondansetron (ZOFRAN-ODT) disintegrating tablet 4 mg  4 mg Oral Q8H PRN Carrion-Carrero, Karle Starch, MD       traZODone (DESYREL) tablet 150 mg  150 mg Oral QHS Motley-Mangrum, Jadeka A, PMHNP   150 mg at 03/30/23 2108   traZODone (DESYREL) tablet 50 mg  50 mg Oral QHS PRN Motley-Mangrum, Jadeka A, PMHNP   50 mg at 03/26/23 2255   PTA Medications: Medications Prior to Admission  Medication Sig Dispense Refill Last Dose   amantadine (SYMMETREL) 100 MG capsule Take 1 capsule (100 mg total) by mouth 2 (two) times daily for 7 days. Takes 1 at 8AM and 1 at 5PM (Patient taking differently: Take 100 mg by mouth See admin instructions. Give 100 mg (1 capsule) twice daily at 0800 and 1700.) 14 capsule 0 03/23/2023   levothyroxine (SYNTHROID) 100 MCG tablet Take 1 tablet (100 mcg total) by mouth daily before breakfast for 7 days. For low thyroid function (Patient  taking differently: Take 100 mcg by mouth daily.) 7 tablet 0 03/23/2023   linaclotide (LINZESS) 290 MCG CAPS capsule Take 290 mcg by mouth daily as needed (GI upset).   03/23/2023   lithium carbonate (LITHOBID) 300 MG ER tablet Take 3 tablets (900 mg total) by mouth at bedtime for 7 days. For mood stabilization (Patient taking differently: Take 300 mg by mouth at bedtime.) 21 tablet 0 03/23/2023   metFORMIN (GLUCOPHAGE) 1000 MG tablet Take 1,000 mg by mouth at bedtime.   03/23/2023   metFORMIN (GLUCOPHAGE) 500 MG tablet Take 500 mg by mouth daily.   03/23/2023   omeprazole (PRILOSEC) 20 MG capsule Take 20 mg by mouth daily.   03/23/2023   traZODone (DESYREL) 150 MG tablet Take 150 mg by mouth at bedtime.    03/23/2023   [DISCONTINUED] apixaban (ELIQUIS) 5 MG TABS tablet Take 5 mg by mouth 2 (two) times daily.   03/23/2023   [DISCONTINUED] citalopram (CELEXA) 20 MG tablet Take 20 mg by mouth daily.   03/23/2023   [DISCONTINUED] OLANZapine-Samidorphan (LYBALVI) 10-10 MG TABS Take 1 tablet by mouth at bedtime.   03/23/2023   acetaminophen (TYLENOL) 325 MG tablet Take 2 tablets (650 mg total) by mouth every 4 (four) hours as needed for fever or mild pain.       Patient Stressors:    Patient Strengths:    Treatment Modalities: Medication Management, Group therapy, Case management,  1 to 1 session with clinician, Psychoeducation, Recreational therapy.   Physician Treatment Plan for Primary Diagnosis: Schizoaffective disorder, bipolar type (HCC) Long Term Goal(s): Improvement in symptoms so as ready for discharge   Short Term Goals: Ability to identify changes in lifestyle to reduce recurrence of condition will improve Ability to verbalize feelings will improve Ability to disclose and discuss suicidal ideas Ability to demonstrate self-control will improve Ability to identify and develop effective coping behaviors will improve Ability to identify triggers associated with substance abuse/mental health issues will improve  Medication Management: Evaluate patient's response, side effects, and tolerance of medication regimen.  Therapeutic Interventions: 1 to 1 sessions, Unit Group sessions and Medication administration.  Evaluation of Outcomes: Adequate for Discharge  Physician Treatment Plan for Secondary Diagnosis: Principal Problem:   Schizoaffective disorder, bipolar type (HCC) Active Problems:   Mild intellectual disability  Long Term Goal(s): Improvement in symptoms so as ready for discharge   Short Term Goals: Ability to identify changes in lifestyle to reduce recurrence of condition will improve Ability to verbalize feelings will improve Ability to disclose and discuss suicidal  ideas Ability to demonstrate self-control will improve Ability to identify and develop effective coping behaviors will improve Ability to identify triggers associated with substance abuse/mental health issues will improve     Medication Management: Evaluate patient's response, side effects, and tolerance of medication regimen.  Therapeutic Interventions: 1 to 1 sessions, Unit Group sessions and Medication administration.  Evaluation of Outcomes: Adequate for Discharge   RN Treatment Plan for Primary Diagnosis: Schizoaffective disorder, bipolar type (HCC) Long Term Goal(s): Knowledge of disease and therapeutic regimen to maintain health will improve  Short Term Goals:  Pt d/c this evening at 5:00PM  Medication Management: RN will administer medications as ordered by provider, will assess and evaluate patient's response and provide education to patient for prescribed medication. RN will report any adverse and/or side effects to prescribing provider.  Therapeutic Interventions: 1 on 1 counseling sessions, Psychoeducation, Medication administration, Evaluate responses to treatment, Monitor vital signs and CBGs as ordered, Perform/monitor CIWA,  COWS, AIMS and Fall Risk screenings as ordered, Perform wound care treatments as ordered.  Evaluation of Outcomes: Adequate for Discharge   LCSW Treatment Plan for Primary Diagnosis: Schizoaffective disorder, bipolar type (HCC) Long Term Goal(s): Safe transition to appropriate next level of care at discharge, Engage patient in therapeutic group addressing interpersonal concerns.  Short Term Goals: Engage patient in aftercare planning with referrals and resources, Increase social support, and Increase skills for wellness and recovery  Therapeutic Interventions: Assess for all discharge needs, 1 to 1 time with Social worker, Explore available resources and support systems, Assess for adequacy in community support network, Educate family and significant  other(s) on suicide prevention, Complete Psychosocial Assessment, Interpersonal group therapy.  Evaluation of Outcomes: Adequate for Discharge   Progress in Treatment: Attending groups: Yes. Participating in groups: Yes. Taking medication as prescribed: Yes. Toleration medication: Yes. Family/Significant other contact made: Yes, contacted:  [name] (Legal Guardian) [phone] Patient understands diagnosis: Yes. Discussing patient identified problems/goals with staff: Yes. Medical problems stabilized or resolved: Yes. Denies suicidal/homicidal ideation: Yes. Issues/concerns per patient self-inventory: No.   New problem(s) identified: No, Describe:  none reported   New Short Term/Long Term Goal(s): medication stabilization, elimination of SI thoughts, development of comprehensive mental wellness plan.      Patient Goals:  "Not criticize myself, get closer to God of course, and no cutting"   Discharge Plan or Barriers: Patient recently admitted. CSW will continue to follow and assess for appropriate referrals and possible discharge planning.      Reason for Continuation of Hospitalization: Medication stabilization Suicidal ideation Other; describe Schizoaffective disorder, bipolar type symptoms   Estimated Length of Stay: Today at 5:00PM  Last 3 [place] Suicide Severity Risk Score: Flowsheet Row Admission (Current) [facility] from [facility]/23/2023 in [facility] Department at [facility] ED from 02/23/2023 in [facility] Department at [facility]  C-SSRS RISK CATEGORY High Risk High Risk High Risk       Last [facility] 2/9 Scores:     No data to display          Scribe for Treatment Team: Kathi Der, LCSWA 03/31/2023 1:27 PM

## 2023-03-31 NOTE — Plan of Care (Signed)
  Problem: Education: Goal: Knowledge of [facility] Education information/materials will improve Outcome: Progressing Goal: Emotional status will improve Outcome: Progressing Goal: Mental status will improve Outcome: Progressing Goal: Verbalization of understanding the information provided will improve Outcome: Progressing   

## 2023-03-31 NOTE — Group Note (Signed)
Date:  03/31/2023 Time:  12:00 PM  Group Topic/Focus:  Orientation:   The focus of this group is to educate the patient on the purpose and policies of crisis stabilization and provide a format to answer questions about their admission.  The group details unit policies and expectations of patients while admitted.    Participation Level:  Active  Participation Quality:  Attentive and Sharing  Affect:  Appropriate  Cognitive:  Lacking  Insight: Appropriate  Engagement in Group:  Engaged  Modes of Intervention:  Discussion  Additional Comments:     Reymundo Poll 03/31/2023, 12:00 PM

## 2023-03-31 NOTE — Progress Notes (Signed)
Discharge Note:  Holly [name], Was discharged and left accompanied by Legal guardian ambulatory with belongings.to private auto  Pt and guardian verbalized understanding of discharge instructions without question Vital signs stable Pt denies SI/HI/AVH

## 2023-03-31 NOTE — Progress Notes (Signed)
   03/30/23 2100  Psych Admission Type (Psych Patients Only)  Admission Status Voluntary/72 hour document signed  Psychosocial Assessment  Patient Complaints Anxiety  Eye Contact Fair  Facial Expression Anxious  Affect Appropriate to circumstance  Speech Logical/coherent  Interaction Assertive  Motor Activity Fidgety  Appearance/Hygiene Unremarkable  Behavior Characteristics Appropriate to situation  Mood Anxious;Pleasant  Thought Process  Coherency WDL  Content WDL  Delusions None reported or observed;WDL  Perception WDL  Hallucination None reported or observed  Judgment Limited  Confusion None  Danger to Self  Current suicidal ideation? Denies  Self-Injurious Behavior No self-injurious ideation or behavior indicators observed or expressed   Agreement Not to Harm Self Yes  Description of Agreement verbal  Danger to Others  Danger to Others None reported or observed

## 2023-03-31 NOTE — Progress Notes (Signed)
     03/31/2023       2:47 PM   Miyu N Sturkey   Type of Note: Conversation with QP of group home  Spoke with [name] this afternoon and confirmed that patient will be picked up at 5:00PM. Ranesha requested medications to be filled before patient leaves to hold pt until next MM appointment. Talked with Sherrilyn Rist, pt's RN and medications being requested are already being filled at the pharmacy. Vincente Liberty has no concerns regarding safety for patient to discharge this evening.    Signed:  Henry Utsey, LCSW-A 03/31/2023  2:47 PM

## 2023-05-04 ENCOUNTER — Ambulatory Visit (HOSPITAL_COMMUNITY)
Admission: EM | Admit: 2023-05-04 | Discharge: 2023-05-05 | Disposition: A | Discharge status: Other Acute Inpatient Hospital | Payer: MEDICAID | Attending: Psychiatry | Admitting: Psychiatry

## 2023-05-04 DIAGNOSIS — Z7984 Long term (current) use of oral hypoglycemic drugs: Secondary | ICD-10-CM | POA: Insufficient documentation

## 2023-05-04 DIAGNOSIS — F319 Bipolar disorder, unspecified: Secondary | ICD-10-CM | POA: Diagnosis not present

## 2023-05-04 DIAGNOSIS — F339 Major depressive disorder, recurrent, unspecified: Secondary | ICD-10-CM

## 2023-05-04 DIAGNOSIS — F259 Schizoaffective disorder, unspecified: Secondary | ICD-10-CM | POA: Insufficient documentation

## 2023-05-04 DIAGNOSIS — R45851 Suicidal ideations: Secondary | ICD-10-CM | POA: Insufficient documentation

## 2023-05-04 DIAGNOSIS — F79 Unspecified intellectual disabilities: Secondary | ICD-10-CM | POA: Insufficient documentation

## 2023-05-04 DIAGNOSIS — Z7901 Long term (current) use of anticoagulants: Secondary | ICD-10-CM | POA: Insufficient documentation

## 2023-05-04 DIAGNOSIS — E119 Type 2 diabetes mellitus without complications: Secondary | ICD-10-CM | POA: Insufficient documentation

## 2023-05-04 LAB — ETHANOL: Alcohol, Ethyl (B): <10 mg/dL (ref <10)

## 2023-05-04 LAB — POCT URINE DRUG SCREEN - MANUAL ENTRY (I-SCREEN)
POC Amphetamine UR: NOT DETECTED
POC Buprenorphine (BUP): NOT DETECTED
POC Cocaine UR: NOT DETECTED
POC Marijuana UR: NOT DETECTED
POC Methadone UR: NOT DETECTED
POC Methamphetamine UR: NOT DETECTED
POC Morphine: NOT DETECTED
POC Oxazepam (BZO): NOT DETECTED
POC Oxycodone UR: NOT DETECTED
POC Secobarbital (BAR): NOT DETECTED

## 2023-05-04 LAB — POC URINE PREG, ED: Preg Test, Ur: NEGATIVE

## 2023-05-04 LAB — CBC WITH DIFFERENTIAL/PLATELET
Abs Immature Granulocytes: 0.03 10*3/uL (ref 0.00–0.07)
Basophils Absolute: 0.1 10*3/uL (ref 0.0–0.1)
Basophils Relative: 0 %
Eosinophils Absolute: 0.1 10*3/uL (ref 0.0–0.5)
Eosinophils Relative: 1 %
HCT: 37.2 % (ref 36.0–46.0)
Hemoglobin: 12 g/dL (ref 12.0–15.0)
Immature Granulocytes: 0 %
Lymphocytes Relative: 26 %
Lymphs Abs: 2.9 10*3/uL (ref 0.7–4.0)
MCH: 28.8 pg (ref 26.0–34.0)
MCHC: 32.3 g/dL (ref 30.0–36.0)
MCV: 89.4 fL (ref 80.0–100.0)
Monocytes Absolute: 0.7 10*3/uL (ref 0.1–1.0)
Monocytes Relative: 6 %
Neutro Abs: 7.6 10*3/uL (ref 1.7–7.7)
Neutrophils Relative %: 67 %
Platelets: 440 10*3/uL — ABNORMAL HIGH (ref 150–400)
RBC: 4.16 MIL/uL (ref 3.87–5.11)
RDW: 14.8 % (ref 11.5–15.5)
WBC: 11.3 10*3/uL — ABNORMAL HIGH (ref 4.0–10.5)
nRBC: 0 % (ref 0.0–0.2)

## 2023-05-04 LAB — COMPREHENSIVE METABOLIC PANEL
ALT: 26 U/L (ref 0–44)
AST: 26 U/L (ref 15–41)
Albumin: 3.8 g/dL (ref 3.5–5.0)
Alkaline Phosphatase: 98 U/L (ref 38–126)
Anion gap: 10 (ref 5–15)
BUN: 10 mg/dL (ref 6–20)
CO2: 22 mmol/L (ref 22–32)
Calcium: 8.9 mg/dL (ref 8.9–10.3)
Chloride: 102 mmol/L (ref 98–111)
Creatinine, Ser: 0.76 mg/dL (ref 0.44–1.00)
GFR, Estimated: >60 mL/min (ref >60)
Glucose, Bld: 87 mg/dL (ref 70–99)
Potassium: 4.2 mmol/L (ref 3.5–5.1)
Sodium: 134 mmol/L — ABNORMAL LOW (ref 135–145)
Total Bilirubin: 0.2 mg/dL (ref <1.2)
Total Protein: 7.4 g/dL (ref 6.5–8.1)

## 2023-05-04 LAB — TSH: TSH: 2.091 u[IU]/mL (ref 0.350–4.500)

## 2023-05-04 MED ORDER — MAGNESIUM HYDROXIDE 400 MG/5ML PO SUSP: 30.0000 mL | Freq: Every day | ORAL | Status: DC | PRN

## 2023-05-04 MED ORDER — ACETAMINOPHEN 325 MG PO TABS: 650.0000 mg | ORAL_TABLET | Freq: Four times a day (QID) | ORAL | Status: DC | PRN

## 2023-05-04 MED ORDER — LORAZEPAM 1 MG PO TABS: 1.0000 mg | ORAL_TABLET | ORAL | Status: DC | PRN

## 2023-05-04 MED ORDER — ALUM & MAG HYDROXIDE-SIMETH 200-200-20 MG/5ML PO SUSP: 30.0000 mL | ORAL | Status: DC | PRN

## 2023-05-04 MED ORDER — OLANZAPINE 10 MG PO TBDP: 10.0000 mg | ORAL_TABLET | Freq: Three times a day (TID) | ORAL | Status: DC | PRN

## 2023-05-04 MED ORDER — ZIPRASIDONE MESYLATE 20 MG IM SOLR: 20.0000 mg | INTRAMUSCULAR | Status: DC | PRN

## 2023-05-04 NOTE — ED Provider Notes (Signed)
[facility] Continuous Assessment Admission H&P  Date: 05/05/23 Patient Name: Holly [name] MRN: [id number] Chief Complaint: suicidal ideation   Diagnoses:  Final diagnoses:  Suicidal ideation  Intellectual disability  Recurrent major depressive disorder, remission status unspecified (HCC)    HPI: Holly [name], 27 y/o female with a history of ADHD, depression, IDD, schizoaffective disorder, PTSD.  Presented to [name] via [facility], per the patient she is suicidal with plans to cut herself.  Review of patient records show multiple ED visits for the same complaint. When asked why she is suicidal patient stated that the people at [facility] do not allow her to do anything.  They do not listen to her, they will not let her even get her hair braided.  According to the patient she wants to work and she is capable of working because she used to work when she was younger but not even that they want her to do they keep bringing up her past and that is years ago.  According to the patient they told her she cannot come back there.  Per the patient her guardian is doing nothing to help her and the guardian does not always busy.  According to the patient she is seeing the psychiatrist [name], but currently she is not seeing a therapist.  Face-to-face evaluation of patient, patient is alert and oriented x 4, speech is clear, maintaining eye contact.  Patient was able to articulate her needs and explain herself.  Patient does appear to be a little bit angry and anxious however overall patient was pleasant.  Patient endorsed suicidal ideation with plan to cut herself patient showed me her arm and said I did it in the past and that is what I feel like doing now.  Patient denies HI, AVH or paranoia.  Patient denies alcohol use denies drinking or illicit drug use.  Given patient past history and current situation.    Recommend inpatient observation  Total Time spent with patient: 20 minutes  Musculoskeletal   Strength & Muscle Tone: within normal limits Gait & Station: normal Patient leans: N/A  Psychiatric Specialty Exam  Presentation General Appearance:  Casual  Eye Contact: Good  Speech: Clear and Coherent  Speech Volume: Normal  Handedness: Right   Mood and Affect  Mood: Depressed; Anxious; Angry; Worthless  Affect: Solicitor Processes: Coherent  Descriptions of Associations:Intact  Orientation:Full (Time, Place and Person)  Thought Content:Logical  Diagnosis of Schizophrenia or Schizoaffective disorder in past: No   Hallucinations:Hallucinations: None  Ideas of Reference:None  Suicidal Thoughts:Suicidal Thoughts: Yes, Active SI Active Intent and/or Plan: With Intent; With Plan  Homicidal Thoughts:Homicidal Thoughts: No   Sensorium  Memory: Immediate Fair  Judgment: Fair  Insight: Fair   Art therapist  Concentration: Fair  Attention Span: Fair  Recall: Fair  Fund of Knowledge: Fair  Language: Fair   Psychomotor Activity  Psychomotor Activity: Psychomotor Activity: Normal   Assets  Assets: Desire for Improvement   Sleep  Sleep: Sleep: Fair Number of Hours of Sleep: 8   Nutritional Assessment (For OBS and FBC admissions only) Has the patient had a weight loss or gain of 10 pounds or more in the last 3 months?: No Has the patient had a decrease in food intake/or appetite?: No Does the patient have dental problems?: No Does the patient have eating habits or behaviors that may be indicators of an eating disorder including binging or inducing vomiting?: No Has the patient recently lost  weight without trying?: 0 Has the patient been eating poorly because of a decreased appetite?: 0 Malnutrition Screening Tool Score: 0    Physical Exam HENT:     Head: Normocephalic.     Nose: Nose normal.  Eyes:     Pupils: Pupils are equal, round, and reactive to light.  Cardiovascular:     Rate  and Rhythm: Tachycardia present.  Pulmonary:     Effort: Pulmonary effort is normal.  Musculoskeletal:        General: Normal range of motion.     Cervical back: Normal range of motion.  Neurological:     General: No focal deficit present.     Mental Status: She is alert.  Psychiatric:        Mood and Affect: Mood normal.        Behavior: Behavior normal.        Thought Content: Thought content normal.        Judgment: Judgment normal.    Review of Systems  Constitutional: Negative.   HENT: Negative.    Eyes: Negative.   Respiratory: Negative.    Cardiovascular: Negative.   Gastrointestinal: Negative.   Genitourinary: Negative.   Musculoskeletal: Negative.   Skin: Negative.   Neurological: Negative.   Psychiatric/Behavioral:  Positive for suicidal ideas. The patient is nervous/anxious.     Blood pressure (!) 129/97, pulse (!) 119, temperature 98.5 F (36.9 C), temperature source Oral, resp. rate 18, SpO2 98%. There is no height or weight on file to calculate BMI.  Past Psychiatric History: IDD, PTSD, SI, MDD   Is the patient at risk to self? Yes  Has the patient been a risk to self in the past 6 months? Yes .    Has the patient been a risk to self within the distant past? Yes   Is the patient a risk to others? No   Has the patient been a risk to others in the past 6 months? No   Has the patient been a risk to others within the distant past? No   Past Medical History: see chart  Family History: unknown  Social History: denies  Last Labs:  Admission on 05/04/2023  Component Date Value Ref Range Status   WBC 05/04/2023 11.3 (H)  4.0 - 10.5 K/uL Final   RBC 05/04/2023 4.16  3.87 - 5.11 MIL/uL Final   Hemoglobin 05/04/2023 12.0  12.0 - 15.0 g/dL Final   HCT 78/29/5621 37.2  36.0 - 46.0 % Final   MCV 05/04/2023 89.4  80.0 - 100.0 fL Final   MCH 05/04/2023 28.8  26.0 - 34.0 pg Final   MCHC 05/04/2023 32.3  30.0 - 36.0 g/dL Final   RDW 30/86/5784 14.8  11.5 - 15.5 %  Final   Platelets 05/04/2023 440 (H)  150 - 400 K/uL Final   nRBC 05/04/2023 0.0  0.0 - 0.2 % Final   Neutrophils Relative % 05/04/2023 67  % Final   Neutro Abs 05/04/2023 7.6  1.7 - 7.7 K/uL Final   Lymphocytes Relative 05/04/2023 26  % Final   Lymphs Abs 05/04/2023 2.9  0.7 - 4.0 K/uL Final   Monocytes Relative 05/04/2023 6  % Final   Monocytes Absolute 05/04/2023 0.7  0.1 - 1.0 K/uL Final   Eosinophils Relative 05/04/2023 1  % Final   Eosinophils Absolute 05/04/2023 0.1  0.0 - 0.5 K/uL Final   Basophils Relative 05/04/2023 0  % Final   Basophils Absolute 05/04/2023 0.1  0.0 - 0.1 K/uL  Final   Immature Granulocytes 05/04/2023 0  % Final   Abs Immature Granulocytes 05/04/2023 0.03  0.00 - 0.07 K/uL Final   Performed at [facility], [address], [place], [place] [address]   Sodium 05/04/2023 134 (L)  135 - 145 mmol/L Final   Potassium 05/04/2023 4.2  3.5 - 5.1 mmol/L Final   Chloride 05/04/2023 102  98 - 111 mmol/L Final   CO2 05/04/2023 22  22 - 32 mmol/L Final   Glucose, Bld 05/04/2023 87  70 - 99 mg/dL Final   Glucose reference range applies only to samples taken after fasting for at least 8 hours.   BUN 05/04/2023 10  6 - 20 mg/dL Final   Creatinine, Ser 05/04/2023 0.76  0.44 - 1.00 mg/dL Final   Calcium 19/14/7829 8.9  8.9 - 10.3 mg/dL Final   Total Protein 56/21/3086 7.4  6.5 - 8.1 g/dL Final   Albumin 57/84/6962 3.8  3.5 - 5.0 g/dL Final   AST 95/28/4132 26  15 - 41 U/L Final   ALT 05/04/2023 26  0 - 44 U/L Final   Alkaline Phosphatase 05/04/2023 98  38 - 126 U/L Final   Total Bilirubin 05/04/2023 0.2  <1.2 mg/dL Final   GFR, Estimated 05/04/2023 >60  >60 mL/min Final   Comment: (NOTE) Calculated using the CKD-EPI Creatinine Equation (2021)    Anion gap 05/04/2023 10  5 - 15 Final   Performed at [facility], [address] [place], [place] [address]   Alcohol, Ethyl (B) 05/04/2023 <10  <10 mg/dL Final   Comment: (NOTE) Lowest detectable limit for serum  alcohol is 10 mg/dL.  For medical purposes only. Performed at [facility], [address] [place], [place] [address]    Preg Test, Ur 05/04/2023 Negative  Negative Final   POC Amphetamine UR 05/04/2023 None Detected  NONE DETECTED (Cut Off Level 1000 ng/mL) Final   POC Secobarbital (BAR) 05/04/2023 None Detected  NONE DETECTED (Cut Off Level 300 ng/mL) Final   POC Buprenorphine (BUP) 05/04/2023 None Detected  NONE DETECTED (Cut Off Level 10 ng/mL) Final   POC Oxazepam (BZO) 05/04/2023 None Detected  NONE DETECTED (Cut Off Level 300 ng/mL) Final   POC Cocaine UR 05/04/2023 None Detected  NONE DETECTED (Cut Off Level 300 ng/mL) Final   POC Methamphetamine UR 05/04/2023 None Detected  NONE DETECTED (Cut Off Level 1000 ng/mL) Final   POC Morphine 05/04/2023 None Detected  NONE DETECTED (Cut Off Level 300 ng/mL) Final   POC Methadone UR 05/04/2023 None Detected  NONE DETECTED (Cut Off Level 300 ng/mL) Final   POC Oxycodone UR 05/04/2023 None Detected  NONE DETECTED (Cut Off Level 100 ng/mL) Final   POC Marijuana UR 05/04/2023 None Detected  NONE DETECTED (Cut Off Level 50 ng/mL) Final   TSH 05/04/2023 2.091  0.350 - 4.500 uIU/mL Final   Comment: Performed by a 3rd Generation assay with a functional sensitivity of <=0.01 uIU/mL. Performed at [facility], [address], [place], [place] [address]   Admission on 03/24/2023, Discharged on 03/31/2023  Component Date Value Ref Range Status   Folate 03/25/2023 9.1  >5.9 ng/mL Final   Performed at [facility], [address], [place], [place] [address]   Hgb A1c MFr Bld 03/25/2023 5.0  4.8 - 5.6 % Final   Comment: (NOTE) Pre diabetes:          5.7%-6.4%  Diabetes:              >  6.4%  Glycemic control for   <7.0% adults with diabetes    Mean Plasma Glucose 03/25/2023 96.8  mg/dL Final   Performed at [facility], [address], [place], [place] [address]   Cholesterol 03/25/2023 122  0 - 200 mg/dL  Final   Triglycerides 03/25/2023 112  <150 mg/dL Final   HDL 62/13/0865 47  >40 mg/dL Final   Total CHOL/HDL Ratio 03/25/2023 2.6  RATIO Final   VLDL 03/25/2023 22  0 - 40 mg/dL Final   LDL Cholesterol 03/25/2023 53  0 - 99 mg/dL Final   Comment:        Total Cholesterol/HDL:CHD Risk Coronary Heart Disease Risk Table                     Men   Women  1/2 Average Risk   3.4   3.3  Average Risk       5.0   4.4  2 X Average Risk   9.6   7.1  3 X Average Risk  23.4   11.0        Use the calculated Patient Ratio above and the CHD Risk Table to determine the patient's CHD Risk.        ATP III CLASSIFICATION (LDL):  <100     mg/dL   Optimal  784-696  mg/dL   Near or Above                    Optimal  130-159  mg/dL   Borderline  295-284  mg/dL   High  >132     mg/dL   Very High Performed at [facility], [address] [place], [place] [address]    RPR Ser Ql 03/25/2023 NON REACTIVE  NON REACTIVE Final   Performed at [facility], [address] [place], [place] [address]   Vitamin B-12 03/25/2023 155 (L)  180 - 914 pg/mL Final   Comment: (NOTE) This assay is not validated for testing neonatal or myeloproliferative syndrome specimens for Vitamin B12 levels. Performed at [facility], [address] [place], [place] [address]    Vit D, 25-Hydroxy 03/25/2023 40.42  30 - 100 ng/mL Final   Comment: (NOTE) Vitamin D deficiency has been defined by the Institute of Medicine  and an Endocrine Society practice guideline as a level of serum 25-OH  vitamin D less than 20 ng/mL (1,2). The Endocrine Society went on to  further define vitamin D insufficiency as a level between 21 and 29  ng/mL (2).  1. IOM (Institute of Medicine). 2010. Dietary reference intakes for  calcium and D. [place]: [employer]. 2. Holick MF, Binkley [place], Bischoff-Ferrari HA, et al. Evaluation,  treatment, and prevention of vitamin D deficiency: an  Endocrine  Society clinical practice guideline, JCEM. 2011 Jul; 96(7): 1911-30.  Performed at [facility], [address], [place], [place] [address]    Lithium Lvl 03/25/2023 0.36 (L)  0.60 - 1.20 mmol/L Final   Performed at [facility], [address], [place], [place] [address]   TSH 03/25/2023 3.105  0.350 - 4.500 uIU/mL Final   Comment: Performed by a 3rd Generation assay with a functional sensitivity of <=0.01 uIU/mL. Performed at [facility], [address], [place], [place] [address]    Lithium Lvl 03/29/2023 0.32 (L)  0.60 - 1.20 mmol/L Final   Performed at [facility], [address] [place], [place] [address]  Admission on 03/23/2023, Discharged on 03/24/2023  Component Date Value Ref Range Status   Sodium 03/23/2023 135  135 - 145 mmol/L Final   Potassium 03/23/2023 3.2 (L)  3.5 - 5.1 mmol/L Final   Chloride 03/23/2023 104  98 - 111 mmol/L Final   CO2 03/23/2023 23  22 - 32 mmol/L Final   Glucose, Bld 03/23/2023 103 (H)  70 - 99 mg/dL Final   Glucose reference range applies only to samples taken after fasting for at least 8 hours.   BUN 03/23/2023 7  6 - 20 mg/dL Final   Creatinine, Ser 03/23/2023 0.84  0.44 - 1.00 mg/dL Final   Calcium 19/14/7829 9.3  8.9 - 10.3 mg/dL Final   Total Protein 56/21/3086 7.8  6.5 - 8.1 g/dL Final   Albumin 57/84/6962 4.0  3.5 - 5.0 g/dL Final   AST 95/28/4132 15  15 - 41 U/L Final   ALT 03/23/2023 9  0 - 44 U/L Final   Alkaline Phosphatase 03/23/2023 85  38 - 126 U/L Final   Total Bilirubin 03/23/2023 0.4  <1.2 mg/dL Final   GFR, Estimated 03/23/2023 >60  >60 mL/min Final   Comment: (NOTE) Calculated using the CKD-EPI Creatinine Equation (2021)    Anion gap 03/23/2023 8  5 - 15 Final   Performed at [facility], [address] [place], [place] [address]   Alcohol, Ethyl (B) 03/23/2023 <10  <10 mg/dL Final   Comment: (NOTE) Lowest detectable limit  for serum alcohol is 10 mg/dL.  For medical purposes only. Performed at [facility], [address], [place], [place] [address]    Opiates 03/23/2023 NONE DETECTED  NONE DETECTED Final   Cocaine 03/23/2023 NONE DETECTED  NONE DETECTED Final   Benzodiazepines 03/23/2023 NONE DETECTED  NONE DETECTED Final   Amphetamines 03/23/2023 NONE DETECTED  NONE DETECTED Final   Tetrahydrocannabinol 03/23/2023 NONE DETECTED  NONE DETECTED Final   Barbiturates 03/23/2023 NONE DETECTED  NONE DETECTED Final   Comment: (NOTE) DRUG SCREEN FOR MEDICAL PURPOSES ONLY.  IF CONFIRMATION IS NEEDED FOR ANY PURPOSE, NOTIFY LAB WITHIN 5 DAYS.  LOWEST DETECTABLE LIMITS FOR URINE DRUG SCREEN Drug Class                     Cutoff (ng/mL) Amphetamine and metabolites    1000 Barbiturate and metabolites    200 Benzodiazepine                 200 Opiates and metabolites        300 Cocaine and metabolites        300 THC                            50 Performed at [facility], [address], [place], [place] [address]    WBC 03/23/2023 10.2  4.0 - 10.5 K/uL Final   RBC 03/23/2023 3.99  3.87 - 5.11 MIL/uL Final   Hemoglobin 03/23/2023 11.5 (L)  12.0 - 15.0 g/dL Final   HCT 34/74/2595 36.5  36.0 - 46.0 % Final   MCV 03/23/2023 91.5  80.0 - 100.0 fL Final   MCH 03/23/2023 28.8  26.0 - 34.0 pg Final   MCHC 03/23/2023 31.5  30.0 - 36.0 g/dL Final   RDW 63/87/5643 15.5  11.5 - 15.5 % Final   Platelets 03/23/2023 321  150 - 400  K/uL Final   nRBC 03/23/2023 0.0  0.0 - 0.2 % Final   Neutrophils Relative % 03/23/2023 71  % Final   Neutro Abs 03/23/2023 7.2  1.7 - 7.7 K/uL Final   Lymphocytes Relative 03/23/2023 21  % Final   Lymphs Abs 03/23/2023 2.2  0.7 - 4.0 K/uL Final   Monocytes Relative 03/23/2023 6  % Final   Monocytes Absolute 03/23/2023 0.6  0.1 - 1.0 K/uL Final   Eosinophils Relative 03/23/2023 1  % Final   Eosinophils Absolute 03/23/2023 0.1  0.0 - 0.5 K/uL Final    Basophils Relative 03/23/2023 1  % Final   Basophils Absolute 03/23/2023 0.1  0.0 - 0.1 K/uL Final   Immature Granulocytes 03/23/2023 0  % Final   Abs Immature Granulocytes 03/23/2023 0.03  0.00 - 0.07 K/uL Final   Performed at [facility], [address], [place], [place] [address]   Preg, Serum 03/23/2023 NEGATIVE  NEGATIVE Final   Comment:        THE SENSITIVITY OF THIS METHODOLOGY IS >10 mIU/mL. Performed at [facility], [address] [place], [place] [address]    Salicylate Lvl 03/23/2023 <7.0 (L)  7.0 - 30.0 mg/dL Final   Performed at [facility], [address] [place], [place] [address]   Acetaminophen (Tylenol), Serum 03/23/2023 <10 (L)  10 - 30 ug/mL Final   Comment: (NOTE) Therapeutic concentrations vary significantly. A range of 10-30 ug/mL  may be an effective concentration for many patients. However, some  are best treated at concentrations outside of this range. Acetaminophen concentrations >150 ug/mL at 4 hours after ingestion  and >50 ug/mL at 12 hours after ingestion are often associated with  toxic reactions.  Performed at [facility], [address], [place], [place] [address]   Admission on 02/23/2023, Discharged on 02/24/2023  Component Date Value Ref Range Status   Sodium 02/23/2023 137  135 - 145 mmol/L Final   Potassium 02/23/2023 3.4 (L)  3.5 - 5.1 mmol/L Final   Chloride 02/23/2023 106  98 - 111 mmol/L Final   CO2 02/23/2023 21 (L)  22 - 32 mmol/L Final   Glucose, Bld 02/23/2023 120 (H)  70 - 99 mg/dL Final   Glucose reference range applies only to samples taken after fasting for at least 8 hours.   BUN 02/23/2023 <5 (L)  6 - 20 mg/dL Final   REPEATED TO VERIFY   Creatinine, Ser 02/23/2023 0.80  0.44 - 1.00 mg/dL Final   Calcium 29/56/2130 9.1  8.9 - 10.3 mg/dL Final   Total Protein 86/57/8469 7.3  6.5 - 8.1 g/dL Final   Albumin 62/95/2841 3.7  3.5 - 5.0 g/dL Final    AST 32/44/0102 17  15 - 41 U/L Final   ALT 02/23/2023 6  0 - 44 U/L Final   Alkaline Phosphatase 02/23/2023 93  38 - 126 U/L Final   Total Bilirubin 02/23/2023 0.6  0.3 - 1.2 mg/dL Final   GFR, Estimated 02/23/2023 >60  >60 mL/min Final   Comment: (NOTE) Calculated using the CKD-EPI Creatinine Equation (2021)    Anion gap 02/23/2023 10  5 - 15 Final   Performed at [facility], [address] [place], [place] [address]   Alcohol, Ethyl (B) 02/23/2023 <10  <10 mg/dL Final   Comment: (NOTE) Lowest detectable limit for serum alcohol is 10 mg/dL.  For medical purposes only. Performed at [facility], [address], [place], [place] [address]    WBC 02/23/2023 8.1  4.0 - 10.5 K/uL Final   RBC 02/23/2023 4.02  3.87 - 5.11 MIL/uL Final   Hemoglobin 02/23/2023 11.7 (L)  12.0 - 15.0 g/dL Final   HCT 60/45/4098 36.6  36.0 - 46.0 % Final   MCV 02/23/2023 91.0  80.0 - 100.0 fL Final   MCH 02/23/2023 29.1  26.0 - 34.0 pg Final   MCHC 02/23/2023 32.0  30.0 - 36.0 g/dL Final   RDW 11/91/4782 15.4  11.5 - 15.5 % Final   Platelets 02/23/2023 378  150 - 400 K/uL Final   nRBC 02/23/2023 0.0  0.0 - 0.2 % Final   Performed at [facility], [address] [place], [place] [address]   Opiates 02/23/2023 NONE DETECTED  NONE DETECTED Final   Cocaine 02/23/2023 NONE DETECTED  NONE DETECTED Final   Benzodiazepines 02/23/2023 NONE DETECTED  NONE DETECTED Final   Amphetamines 02/23/2023 NONE DETECTED  NONE DETECTED Final   Tetrahydrocannabinol 02/23/2023 NONE DETECTED  NONE DETECTED Final   Barbiturates 02/23/2023 NONE DETECTED  NONE DETECTED Final   Comment: (NOTE) DRUG SCREEN FOR MEDICAL PURPOSES ONLY.  IF CONFIRMATION IS NEEDED FOR ANY PURPOSE, NOTIFY LAB WITHIN 5 DAYS.  LOWEST DETECTABLE LIMITS FOR URINE DRUG SCREEN Drug Class                     Cutoff (ng/mL) Amphetamine and metabolites    1000 Barbiturate and metabolites    200 Benzodiazepine                 200 Opiates  and metabolites        300 Cocaine and metabolites        300 THC                            50 Performed at [facility], [address], [place], [place] [address]    Preg, Serum 02/23/2023 NEGATIVE  NEGATIVE Final   Comment:        THE SENSITIVITY OF THIS METHODOLOGY IS >10 mIU/mL. Performed at [facility], [address], [place], [place] [address]    Osmolality 02/23/2023 286  275 - 295 mOsm/kg Final   Comment: REPEATED TO VERIFY Performed at [facility], [address], [place], [place] [address]    Lithium Lvl 02/24/2023 0.14 (L)  0.60 - 1.20 mmol/L Final   Performed at [facility], [address] [place], [place] [address]    Allergies: Patient has no active allergies.  Medications:  Facility Ordered Medications  Medication   acetaminophen (TYLENOL) tablet 650 mg   alum & mag hydroxide-simeth (MAALOX/MYLANTA) 200-200-20 MG/5ML suspension 30 mL   magnesium hydroxide (MILK OF MAGNESIA) suspension 30 mL   OLANZapine zydis (ZYPREXA) disintegrating tablet 10 mg   And   LORazepam (ATIVAN) tablet 1 mg   And   ziprasidone (GEODON) injection 20 mg   PTA Medications  Medication Sig   linaclotide (LINZESS) 290 MCG CAPS capsule Take 290 mcg by mouth daily as needed (GI upset).   metFORMIN (GLUCOPHAGE) 1000 MG tablet Take 1,000 mg by mouth at bedtime.   omeprazole (PRILOSEC) 20 MG capsule Take 20 mg by mouth daily.   acetaminophen (TYLENOL) 325 MG tablet Take 2 tablets (650 mg total) by mouth every 4 (four) hours as needed for fever or mild pain.   metFORMIN (  GLUCOPHAGE) 500 MG tablet Take 500 mg by mouth daily.   citalopram (CELEXA) 20 MG tablet Take 1 tablet (20 mg total) by mouth daily.   OLANZapine-Samidorphan (LYBALVI) 10-10 MG TABS Take 1 tablet by mouth at bedtime.   traZODone (DESYREL) 150 MG tablet Take 1 tablet (150 mg total) by mouth at bedtime.   lithium carbonate (LITHOBID) 300 MG ER tablet Take 4 tablets (1,200 mg total) by mouth at  bedtime.   cyanocobalamin 1000 MCG tablet Take 1 tablet (1,000 mcg total) by mouth daily.   apixaban (ELIQUIS) 5 MG TABS tablet Take 1 tablet (5 mg total) by mouth 2 (two) times daily.      Medical Decision Making  Inpatient observation  Lab Orders         SARS Coronavirus 2 by RT PCR (hospital order, performed in [facility] hospital lab) *cepheid single result test* Anterior Nasal Swab         CBC with Differential/Platelet         Comprehensive metabolic panel         Ethanol         TSH         POC urine preg, ED         POCT Urine Drug Screen - (I-Screen)      Meds ordered this encounter  Medications   acetaminophen (TYLENOL) tablet 650 mg   alum & mag hydroxide-simeth (MAALOX/MYLANTA) 200-200-20 MG/5ML suspension 30 mL   magnesium hydroxide (MILK OF MAGNESIA) suspension 30 mL   AND Linked Order Group    OLANZapine zydis (ZYPREXA) disintegrating tablet 10 mg    LORazepam (ATIVAN) tablet 1 mg    ziprasidone (GEODON) injection 20 mg       Recommendations  Based on my evaluation the patient does not appear to have an emergency medical condition.  Sindy Guadeloupe, NP 05/05/23  5:18 AM
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
## 2023-05-04 NOTE — ED Notes (Signed)
Patient resting quietly in bed with eyes closed. Respirations equal and unlabored, skin warm and dry, NAD. Routine safety checks conducted according to facility protocol. Will continue to monitor for safety.  

## 2023-05-04 NOTE — ED Notes (Signed)
Patient A&Ox4. Patient reports SI with a plan to cut wrist. Patient verbally contracts for safety while on unit. Denies HI and AVH. Patient oriented to unit,  meal and juice given.  Patient denies any physical complaints when asked. No acute distress noted. Support and encouragement provided. Routine safety checks conducted according to facility protocol. Encouraged patient to notify staff if thoughts of harm toward self or others arise. Patient verbalize understanding and agreement. Will continue to monitor for safety.
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
## 2023-05-05 ENCOUNTER — Encounter (HOSPITAL_COMMUNITY): Payer: Self-pay

## 2023-05-05 ENCOUNTER — Inpatient Hospital Stay (HOSPITAL_COMMUNITY)
Admission: AD | Admit: 2023-05-05 | Discharge: 2023-05-12 | DRG: 885 | Disposition: A | Discharge status: Home / Self Care | Payer: MEDICAID | Source: Intra-hospital | Attending: Psychiatry | Admitting: Psychiatry

## 2023-05-05 ENCOUNTER — Other Ambulatory Visit: Payer: Self-pay

## 2023-05-05 DIAGNOSIS — F250 Schizoaffective disorder, bipolar type: Principal | ICD-10-CM | POA: Diagnosis present

## 2023-05-05 DIAGNOSIS — Z79890 Hormone replacement therapy (postmenopausal): Secondary | ICD-10-CM | POA: Diagnosis not present

## 2023-05-05 DIAGNOSIS — Z560 Unemployment, unspecified: Secondary | ICD-10-CM

## 2023-05-05 DIAGNOSIS — E119 Type 2 diabetes mellitus without complications: Secondary | ICD-10-CM | POA: Diagnosis present

## 2023-05-05 DIAGNOSIS — F70 Mild intellectual disabilities: Secondary | ICD-10-CM | POA: Diagnosis present

## 2023-05-05 DIAGNOSIS — Z5982 Transportation insecurity: Secondary | ICD-10-CM

## 2023-05-05 DIAGNOSIS — Z5941 Food insecurity: Secondary | ICD-10-CM | POA: Diagnosis not present

## 2023-05-05 DIAGNOSIS — Z814 Family history of other substance abuse and dependence: Secondary | ICD-10-CM

## 2023-05-05 DIAGNOSIS — R45851 Suicidal ideations: Secondary | ICD-10-CM | POA: Diagnosis present

## 2023-05-05 DIAGNOSIS — F909 Attention-deficit hyperactivity disorder, unspecified type: Secondary | ICD-10-CM | POA: Diagnosis present

## 2023-05-05 DIAGNOSIS — Z7984 Long term (current) use of oral hypoglycemic drugs: Secondary | ICD-10-CM

## 2023-05-05 DIAGNOSIS — F419 Anxiety disorder, unspecified: Secondary | ICD-10-CM | POA: Diagnosis present

## 2023-05-05 DIAGNOSIS — Z9151 Personal history of suicidal behavior: Secondary | ICD-10-CM

## 2023-05-05 DIAGNOSIS — Z6839 Body mass index (BMI) 39.0-39.9, adult: Secondary | ICD-10-CM | POA: Diagnosis not present

## 2023-05-05 DIAGNOSIS — E039 Hypothyroidism, unspecified: Secondary | ICD-10-CM | POA: Diagnosis present

## 2023-05-05 DIAGNOSIS — F4310 Post-traumatic stress disorder, unspecified: Secondary | ICD-10-CM | POA: Diagnosis present

## 2023-05-05 DIAGNOSIS — F332 Major depressive disorder, recurrent severe without psychotic features: Secondary | ICD-10-CM | POA: Insufficient documentation

## 2023-05-05 DIAGNOSIS — E538 Deficiency of other specified B group vitamins: Secondary | ICD-10-CM | POA: Diagnosis present

## 2023-05-05 DIAGNOSIS — E669 Obesity, unspecified: Secondary | ICD-10-CM | POA: Diagnosis present

## 2023-05-05 LAB — LITHIUM LEVEL: Lithium Lvl: 0.11 mmol/L — ABNORMAL LOW (ref 0.60–1.20)

## 2023-05-05 LAB — GLUCOSE, CAPILLARY: Glucose-Capillary: 129 mg/dL — ABNORMAL HIGH (ref 70–99)

## 2023-05-05 MED ORDER — METFORMIN HCL 500 MG PO TABS
500.0000 mg | ORAL_TABLET | Freq: Every day | ORAL | Status: DC
  Administered 2023-05-06 – 2023-05-12 (×7): 500 mg via ORAL
  Filled 2023-05-05 (×3): qty 1
  Filled 2023-05-05: qty 3
  Filled 2023-05-05 (×3): qty 1
  Filled 2023-05-05: qty 3
  Filled 2023-05-05 (×2): qty 1

## 2023-05-05 MED ORDER — METFORMIN HCL 500 MG PO TABS
1000.0000 mg | ORAL_TABLET | Freq: Every day | ORAL | Status: DC
  Administered 2023-05-05 – 2023-05-11 (×7): 1000 mg via ORAL
  Filled 2023-05-05 (×5): qty 2
  Filled 2023-05-05 (×2): qty 6
  Filled 2023-05-05 (×4): qty 2

## 2023-05-05 MED ORDER — LEVOTHYROXINE SODIUM 100 MCG PO TABS
100.0000 ug | ORAL_TABLET | Freq: Every day | ORAL | Status: DC
  Administered 2023-05-06 – 2023-05-12 (×7): 100 ug via ORAL
  Filled 2023-05-05 (×2): qty 1
  Filled 2023-05-05: qty 3
  Filled 2023-05-05: qty 1
  Filled 2023-05-05: qty 3
  Filled 2023-05-05 (×6): qty 1

## 2023-05-05 MED ORDER — CITALOPRAM HYDROBROMIDE 20 MG PO TABS
20.0000 mg | ORAL_TABLET | Freq: Every day | ORAL | Status: DC
  Administered 2023-05-05 – 2023-05-12 (×8): 20 mg via ORAL
  Filled 2023-05-05 (×4): qty 1
  Filled 2023-05-05: qty 3
  Filled 2023-05-05: qty 1
  Filled 2023-05-05: qty 3
  Filled 2023-05-05 (×4): qty 1

## 2023-05-05 MED ORDER — LORAZEPAM 1 MG PO TABS: 1.0000 mg | ORAL_TABLET | ORAL | Status: DC | PRN

## 2023-05-05 MED ORDER — LEVOTHYROXINE SODIUM 100 MCG PO TABS
100.0000 ug | ORAL_TABLET | Freq: Every day | ORAL | Status: DC
  Filled 2023-05-05: qty 1

## 2023-05-05 MED ORDER — OLANZAPINE 10 MG PO TABS
10.0000 mg | ORAL_TABLET | Freq: Every day | ORAL | Status: DC
  Administered 2023-05-05 – 2023-05-11 (×7): 10 mg via ORAL
  Filled 2023-05-05: qty 1
  Filled 2023-05-05: qty 3
  Filled 2023-05-05: qty 1
  Filled 2023-05-05: qty 3
  Filled 2023-05-05 (×7): qty 1

## 2023-05-05 MED ORDER — HYDROXYZINE HCL 25 MG PO TABS
25.0000 mg | ORAL_TABLET | Freq: Three times a day (TID) | ORAL | Status: DC | PRN
  Administered 2023-05-07 – 2023-05-09 (×3): 25 mg via ORAL
  Filled 2023-05-05: qty 1
  Filled 2023-05-05: qty 6
  Filled 2023-05-05 (×2): qty 1

## 2023-05-05 MED ORDER — TRAZODONE HCL 150 MG PO TABS
150.0000 mg | ORAL_TABLET | Freq: Every day | ORAL | Status: DC
  Administered 2023-05-05 – 2023-05-11 (×7): 150 mg via ORAL
  Filled 2023-05-05: qty 3
  Filled 2023-05-05 (×7): qty 1
  Filled 2023-05-05: qty 3
  Filled 2023-05-05 (×2): qty 1

## 2023-05-05 MED ORDER — MAGNESIUM HYDROXIDE 400 MG/5ML PO SUSP: 30.0000 mL | Freq: Every day | ORAL | Status: DC | PRN

## 2023-05-05 MED ORDER — ACETAMINOPHEN 325 MG PO TABS
650.0000 mg | ORAL_TABLET | Freq: Four times a day (QID) | ORAL | Status: DC | PRN
  Administered 2023-05-05 – 2023-05-11 (×5): 650 mg via ORAL
  Filled 2023-05-05 (×5): qty 2

## 2023-05-05 MED ORDER — ALUM & MAG HYDROXIDE-SIMETH 200-200-20 MG/5ML PO SUSP
30.0000 mL | ORAL | Status: DC | PRN
  Administered 2023-05-08: 30 mL via ORAL
  Filled 2023-05-05: qty 30

## 2023-05-05 MED ORDER — ZIPRASIDONE MESYLATE 20 MG IM SOLR: 20.0000 mg | INTRAMUSCULAR | Status: DC | PRN

## 2023-05-05 MED ORDER — LITHIUM CARBONATE 300 MG PO CAPS
1200.0000 mg | ORAL_CAPSULE | Freq: Every day | ORAL | Status: DC
  Administered 2023-05-05 – 2023-05-11 (×7): 1200 mg via ORAL
  Filled 2023-05-05 (×9): qty 4
  Filled 2023-05-05 (×2): qty 12

## 2023-05-05 MED ORDER — PANTOPRAZOLE SODIUM 40 MG PO TBEC
40.0000 mg | DELAYED_RELEASE_TABLET | Freq: Every day | ORAL | Status: DC
  Administered 2023-05-05 – 2023-05-12 (×8): 40 mg via ORAL
  Filled 2023-05-05 (×9): qty 1
  Filled 2023-05-05: qty 3
  Filled 2023-05-05: qty 1
  Filled 2023-05-05: qty 3

## 2023-05-05 MED ORDER — VITAMIN B-12 1000 MCG PO TABS
1000.0000 ug | ORAL_TABLET | Freq: Every day | ORAL | Status: DC
  Administered 2023-05-05 – 2023-05-12 (×8): 1000 ug via ORAL
  Filled 2023-05-05 (×2): qty 1
  Filled 2023-05-05: qty 3
  Filled 2023-05-05 (×2): qty 1
  Filled 2023-05-05: qty 3
  Filled 2023-05-05 (×5): qty 1

## 2023-05-05 MED ORDER — OLANZAPINE 10 MG PO TBDP: 10.0000 mg | ORAL_TABLET | Freq: Three times a day (TID) | ORAL | Status: DC | PRN

## 2023-05-05 MED ORDER — TRAZODONE HCL 50 MG PO TABS: 50.0000 mg | ORAL_TABLET | Freq: Every evening | ORAL | Status: DC | PRN

## 2023-05-05 NOTE — Plan of Care (Signed)
  Problem: Education: Goal: Emotional status will improve Outcome: Progressing   Problem: Activity: Goal: Interest or engagement in activities will improve Outcome: Progressing Goal: Sleeping patterns will improve Outcome: Progressing   Problem: Safety: Goal: Periods of time without injury will increase Outcome: Progressing
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
## 2023-05-05 NOTE — ED Notes (Signed)
Patent is alert and oriented. She endorses SI with a plan to cut her wrist. She denies HI or AVH. No PRN medications needed. Pt is pleasant, calm, and cooperative. She is aware that she will discharge today. She denies physical pain or discomfort. She denies any additional needs at this time. We will continue to monitor for safety.
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
## 2023-05-05 NOTE — Plan of Care (Signed)
  Problem: Education: Goal: Knowledge of [facility] Education information/materials will improve Outcome: Progressing Goal: Verbalization of understanding the information provided will improve Outcome: Progressing   
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
## 2023-05-05 NOTE — ED Notes (Signed)
Patient discharged in no acute distress. Patient discharged to [facility]. Transported by [employer].

## 2023-05-05 NOTE — ED Provider Notes (Signed)
FBC/OBS ASAP Discharge Summary  Date and Time: 05/05/2023 12:56 PM  Name: Holly [name]  MRN:  [id number]   Discharge Diagnoses:  Final diagnoses:  Suicidal ideation  Intellectual disability  Recurrent major depressive disorder, remission status unspecified (HCC)    Subjective: Patient seen face-to-face by this provider.  Consulted with attending psychiatrist, Dr. Enedina Finner. On today's assessment, the patient reports active suicidal ideation with a plan to cut her wrists, alongside feelings of hopelessness, worthlessness, and guilt.  Her mood is described as "could be better," with depression and anxiety rated at 5 out of 10 on a scale of 1-10 with 10 being most severe.  She reports a disrupted sleep pattern, averaging 3 to 5 hours per night.  She denies homicidal ideations, hallucinations, and paranoia.  Patient reports she feels isolated and unsupported, particularly due to her guardian's absence and a lack of access to her coping mechanism, music. She reports that her self-esteem is notably low, and she struggles with self-inflicted behaviors, evidenced by lacerations on her forearm.      Stay Summary: Junious Silk. Campen is a 27 year old female with a significant psychiatric history, including intellectual disability, schizoaffective disorder, PTSD, and multiple suicide attempts. She presented to [facility] via [employer], reporting active suicidal ideation with a plan to cut her wrists.  She expressed feeling unheard by those she lives with.  The patient reported she was expelled from her group home, however, this information was not verified by law enforcement.  On chart review: She has been hospitalized over 7 times for psychiatric reasons, with the most recent admission from November 15 to November 22 at [facility].  On day of discharge, follow-up labs were added on including A1c, urinalysis, and lithium levels have been ordered to monitor her medical conditions and medication  levels.  Blood sugar monitoring will also be conducted. Patient has agreed to this plan.   Total Time spent with patient: 45 minutes  Past Psychiatric History: IDD, bipolar disorder, schizophrenia, mood disorder.   Past Medical History: On chart review: PCP: Unknown Medical Dx: History of PE 09/2022, history of diabetes on metformin  Medications: eliquis 5 Q12H, metformin Allergies: none Hospitalizations: Hospitalized for PE 09/2022 (d/c on eliquis for 6 months) Seizures: none Contraceptives: None, was on depo-provera which was thought to be etiology of recent PE  Family History: Chart review: Adopted: Yes  Family Psychiatric History: On chart review: Mother with alcoholism  Social History: On chart review Living Situation: Was living in ALF with princess. Has legal guardian.  Education: History of mild intellectual disability Occupational hx: Reports she gets disability check from the state, has EBT  Marital Status: Single  Children: Denies  Legal: Denies  Access to firearms: Denies Denies alcohol use or substance use history.   Tobacco Cessation:  N/A, patient does not currently use tobacco products  Current Medications:  No current facility-administered medications for this encounter.   No current outpatient medications on file.   Facility-Administered Medications Ordered in Other Encounters  Medication Dose Route Frequency Provider Last Rate Last Admin   acetaminophen (TYLENOL) tablet 650 mg  650 mg Oral Q6H PRN Cleburn Maiolo H, NP       alum & mag hydroxide-simeth (MAALOX/MYLANTA) 200-200-20 MG/5ML suspension 30 mL  30 mL Oral Q4H PRN Bretton Tandy H, NP       hydrOXYzine (ATARAX) tablet 25 mg  25 mg Oral TID PRN Melysa Schroyer H, NP       [START ON 05/06/2023] levothyroxine (SYNTHROID)  tablet 100 mcg  100 mcg Oral Q0600 Chilton Si, Terri L, RPH       OLANZapine zydis (ZYPREXA) disintegrating tablet 10 mg  10 mg Oral Q8H PRN Dayvion Sans H, NP       And    LORazepam (ATIVAN) tablet 1 mg  1 mg Oral PRN Macon Sandiford H, NP       And   ziprasidone (GEODON) injection 20 mg  20 mg Intramuscular PRN Sherrika Weakland H, NP       magnesium hydroxide (MILK OF MAGNESIA) suspension 30 mL  30 mL Oral Daily PRN Gianni Fuchs H, NP       metFORMIN (GLUCOPHAGE) tablet 1,000 mg  1,000 mg Oral QHS Rayanna Matusik H, NP       [START ON 05/06/2023] metFORMIN (GLUCOPHAGE) tablet 500 mg  500 mg Oral Q breakfast Lajuan Kovaleski H, NP       pantoprazole (PROTONIX) EC tablet 40 mg  40 mg Oral Daily Sherby Moncayo H, NP       traZODone (DESYREL) tablet 50 mg  50 mg Oral QHS PRN Wendi Lastra H, NP        PTA Medications:         No data to display          Flowsheet Row [facility]2023 in [facility] (Discharged[facility]  C-SSRS RISK CATEGORY High Risk High Risk High Risk       Musculoskeletal  Strength & Muscle Tone: within normal limits Gait & Station: normal Patient leans: N/A  Psychiatric Specialty Exam  Presentation  General Appearance:  Casual; Fairly Groomed  Eye Contact: Good  Speech: Clear and Coherent  Speech Volume: Normal  Handedness: Right   Mood and Affect  Mood: Depressed; Worthless  Affect: Solicitor Processes: Coherent  Descriptions of Associations:Intact  Orientation:Full (Time, Place and Person)  Thought Content:WDL  Diagnosis of Schizophrenia or Schizoaffective disorder in past: Yes    Hallucinations:Hallucinations: None  Ideas of Reference:None  Suicidal Thoughts:Suicidal Thoughts: Yes, Active (Endorses SI with plan to cut her wrists) SI Active Intent and/or Plan: With Intent; With Plan  Homicidal Thoughts:Homicidal Thoughts: No   Sensorium  Memory: Immediate  Fair  Judgment: Poor  Insight: Fair   Chartered certified accountant: Fair  Attention Span: Fair  Recall: Fair  Fund of Knowledge: Fair  Language: Fair   Psychomotor Activity  Psychomotor Activity: Psychomotor Activity: Normal   Assets  Assets: Desire for Improvement   Sleep  Sleep: Sleep: Poor Number of Hours of Sleep: 8   Nutritional Assessment (For OBS and FBC admissions only) Has the patient had a weight loss or gain of 10 pounds or more in the last 3 months?: No Has the patient had a decrease in food intake/or appetite?: No Does the patient have dental problems?: No Does the patient have eating habits or behaviors that may be indicators of an eating disorder including binging or inducing vomiting?: No Has the patient recently lost weight without trying?: 0 Has the patient been eating poorly because of a decreased appetite?: 0 Malnutrition Screening Tool Score: 0      Physical Exam  Physical Exam Vitals and nursing note reviewed.  Constitutional:      General: She is not in acute distress.    Appearance: She is obese.  HENT:  Head: Normocephalic.     Nose: Nose normal.  Eyes:     Conjunctiva/sclera: Conjunctivae normal.  Pulmonary:     Effort: No respiratory distress.  Musculoskeletal:        General: Normal range of motion.     Cervical back: Normal range of motion.  Neurological:     Mental Status: She is alert and oriented to person, place, and time.    Review of Systems  Constitutional: Negative.   HENT: Negative.    Respiratory:  Negative for cough and shortness of breath.   Cardiovascular:  Negative for chest pain and palpitations.  Gastrointestinal:  Negative for constipation, diarrhea, nausea and vomiting.  Neurological: Negative.   Psychiatric/Behavioral:  Positive for depression and suicidal ideas. The patient has insomnia.    Blood pressure 130/76, pulse 76, temperature 97.9 F (36.6 C), resp. rate 18, SpO2  100%. There is no height or weight on file to calculate BMI.  Demographic Factors:  Caucasian and Living alone  Loss Factors: NA  Historical Factors: Prior suicide attempts and Impulsivity  Risk Reduction Factors:   Positive social support  Continued Clinical Symptoms:  Depression:   Hopelessness More than one psychiatric diagnosis Previous Psychiatric Diagnoses and Treatments Medical Diagnoses and Treatments/Surgeries  Cognitive Features That Contribute To Risk:  None    Suicide Risk:  Severe:  Frequent, intense, and enduring suicidal ideation, specific plan, no subjective intent, but some objective markers of intent (i.e., choice of lethal method), the method is accessible, some limited preparatory behavior, evidence of impaired self-control, severe dysphoria/symptomatology, multiple risk factors present, and few if any protective factors, particularly a lack of social support.  Plan Of Care/Follow-up recommendations:  Psychiatric inpatient hospitalization recommended for continued active suicidal ideations with plan and intent, alongside severe depressive symptoms.  Disposition: Patient has been accepted to [facility]  Willeen Cass [place], NP 05/05/2023, 12:56 PM

## 2023-05-05 NOTE — ED Notes (Signed)
Report given to [facility] called for transportation.

## 2023-05-05 NOTE — ED Notes (Signed)
Patient observed sitting up in lounger eating breakfast. She denies any needs at this time. Will continue to monitor for safety.

## 2023-05-05 NOTE — Discharge Instructions (Signed)
Patient being transferred to inpatient psych

## 2023-05-05 NOTE — ED Notes (Signed)
Patient resting quietly in bed with eyes closed. Respirations equal and unlabored, skin warm and dry, NAD. Routine safety checks conducted according to facility protocol. Will continue to monitor for safety.  
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
## 2023-05-05 NOTE — Group Note (Unsigned)
Date:  05/06/2023 Time:  4:04 AM  Group Topic/Focus:  Wrap-Up Group:   The focus of this group is to help patients review their daily goal of treatment and discuss progress on daily workbooks.    Participation Level:  Active  Participation Quality:  Appropriate and Sharing  Affect:  Appropriate  Cognitive:  Appropriate  Insight: Appropriate  Engagement in Group:  Engaged  Modes of Intervention:  Activity and Socialization  Additional Comments:  Patient stated that he had a "good day". Patient shared that he was "nerves about coming here but has met some good people, who have made him fell comfortable". Patient stated that his goal is to "stay positive and learn something". Patient rated his day a "6 or 7 out of 10". Patient participated in activity after sharing.   Holly [name] 05/06/2023, 4:04 AM

## 2023-05-06 DIAGNOSIS — F250 Schizoaffective disorder, bipolar type: Secondary | ICD-10-CM | POA: Diagnosis not present

## 2023-05-06 LAB — GLUCOSE, CAPILLARY: Glucose-Capillary: 113 mg/dL — ABNORMAL HIGH (ref 70–99)
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
## 2023-05-06 NOTE — Plan of Care (Signed)

## 2023-05-06 NOTE — Progress Notes (Signed)
[facility] MD Progress Note  05/06/2023 10:26 AM Holly [name]  MRN:  [id number]  Principal Problem: Schizoaffective disorder, bipolar type (HCC) Diagnosis: Principal Problem:   Schizoaffective disorder, bipolar type (HCC) Active Problems:   Mild intellectual disability  Reason for Admission:  Holly [name] is a 27 y.o., female with a past psychiatric history significant for schizoaffective disorder and mild intellectual disability who presents to the [facility] from behavioral health urgent care for evaluation and management of worsening depression and SI with a plan to cut her wrist to kill herself.  According to outside records, the patient presented there with worsening depression, SI with a plan to cut her wrist secondary to stress related to living situation at ALF  (admitted on 05/05/2023, total  LOS: 1 day )  Chart review: Overnight Events Vital signs: HR 110, BP wnl.  MAR was reviewed and patient was compliant with medications.  Patient received PRN tylenol. Li level 0.11 Attended 1/1 group.   Pertinent information discussed during bed progression:  Case was discussed in the multidisciplinary team. Nursing reported patient slept 5.25 hours. She reported passive SI to the nurse, plan to cut wrist.   Information Obtained Today During Patient Interview: Patient evaluated at bedside. Reports sleep 4-5 hours, reports that she didn't want to go to sleep initially but then eventually went to sleep. Reports she went to use the bathroom once. Reports appetite good, she ate grits and a gravy biscuit for breakfast. States mood is "on and off" today. Side effects to currently prescribed medications are none. There are no somatic complaints. Reports regular bowel movements.. Reports physical complaints are some knee pain that improved with tylenol. She reports chronic knee pain. She reports leading up to admission her guardian and the person she was living with were putting  her down. She reports she wants to work on getting a new guardian. She reports she went to a PCP appointment with the person she is living with and they got into an argument because she wanted to get instant ramen and the person was encouraging her to eat more celery and vegetables. She states that person was being an "a-s-s" and she wanted to get out of the car but she wouldn't let her so she let herself out. She reports she wants a new guardian because her current guardian won't let her have a therapy dog or mp3 player. Discussed low Li level, she reports she was compliant with medication.   On interview, suicidal ideations are present. She reports last time was yesterday and she had thoughts of hurting herself with plan to cut herself. She contracts for safety and states she will tell staff if she has negative thoughts. Homicidal ideations are not present.  There are no auditory hallucinations, visual hallucinations, paranoid ideations, or delusional thought processes.   Reports goals for today include to think more positive. She reports she will write in her journal, pray.   Past Psychiatric History:  Current Psychiatrist: Tamela Oddi Current Therapist: no therapist for 1.5 years Previous Psychiatric Diagnoses: schizoaffective disorder bipolar type, intellectual disability, ADHD, PTSD Current psychiatric medications:            Tylenol PRN, celexa 20, B12 1000 daily, synthroid 100 daily, lithium 1200 at bedtime, lybalvi 10 at bedtime, metformin [phone], trazodone 150 at bedtime  Psychiatric medication history/compliance: -prozac 20, celexa 20, amantadine 100 BID, abilify 10, lithium 900 at bedtime, trazodone 300, cogentin 1, doxepin 25, zyprexa 15 [facility] 08/2015: Schizoaffective disorder bipolar type  Meds:  prozac 20, lithium 450 CR BID, zyprexa 15  Psychiatric Hospitalization hx: 8 Prior Psychiatric Hospitalizations (2000, 2007x2, 2008, 2015, 2016, 2017x2, 03/2023) Psychotherapy hx: history of  seeing therapist Neuromodulation history: none History of suicide (obtained from HPI): multiple suicide attempts, most recently 2017 after ingesting 6-8 AA batteries as a suicidal gesture History of homicide or aggression (obtained in HPI): none  Family Psychiatric History:  Mother with alcoholism   Social History:  Living Situation: Was living in ALF. Has legal guardian. Holly Balloon, [phone].  Education: history of mild intellectual disability Occupational hx: reports she gets disability check from the state, has EBT  Marital Status: single  Children: denies  Legal: denies    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   Obesity    PTSD (post-traumatic stress disorder)    Family History:  Family History  Adopted: Yes    Current Medications: Current Facility-Administered Medications  Medication Dose Route Frequency Provider Last Rate Last Admin   acetaminophen (TYLENOL) tablet 650 mg  650 mg Oral Q6H PRN Bennett, Christal H, NP   650 mg at 05/05/23 1820   alum & mag hydroxide-simeth (MAALOX/MYLANTA) 200-200-20 MG/5ML suspension 30 mL  30 mL Oral Q4H PRN Bennett, Christal H, NP       citalopram (CELEXA) tablet 20 mg  20 mg Oral Daily Abbott Pao, Nadir, MD   20 mg at 05/06/23 0981   cyanocobalamin (VITAMIN B12) tablet 1,000 mcg  1,000 mcg Oral Daily Abbott Pao, Nadir, MD   1,000 mcg at 05/06/23 1914   hydrOXYzine (ATARAX) tablet 25 mg  25 mg Oral TID PRN Willeen Cass, Christal H, NP       levothyroxine (SYNTHROID) tablet 100 mcg  100 mcg Oral Q0600 Otho Bellows, RPH   100 mcg at 05/06/23 7829   lithium carbonate capsule 1,200 mg  1,200 mg Oral QHS Attiah, Nadir, MD   1,200 mg at 05/05/23 2151   OLANZapine zydis (ZYPREXA)  disintegrating tablet 10 mg  10 mg Oral Q8H PRN Bennett, Christal H, NP       And   LORazepam (ATIVAN) tablet 1 mg  1 mg Oral PRN Bennett, Christal H, NP       And   ziprasidone (GEODON) injection 20 mg  20 mg Intramuscular PRN Bennett, Christal H, NP       magnesium hydroxide (MILK OF MAGNESIA) suspension 30 mL  30 mL Oral Daily PRN Bennett, Christal H, NP       metFORMIN (GLUCOPHAGE) tablet 1,000 mg  1,000 mg Oral QHS Bennett, Christal H, NP   1,000 mg at 05/05/23 2151   metFORMIN (GLUCOPHAGE) tablet 500 mg  500 mg Oral Q breakfast Bennett, Christal H, NP   500 mg at 05/06/23 0811   OLANZapine (ZYPREXA) tablet 10 mg  10 mg Oral QHS Attiah, Nadir, MD   10 mg at 05/05/23 2151   pantoprazole (PROTONIX) EC tablet 40 mg  40 mg Oral Daily Bennett, Christal H, NP   40 mg at 05/06/23 0812   traZODone (DESYREL) tablet 150 mg  150 mg Oral QHS Abbott Pao, Nadir, MD   150 mg at 05/05/23 2151    Lab Results:  Results for orders placed or performed during the hospital encounter of 05/05/23 (from the past 48 hours)  Glucose, capillary     Status: Abnormal   Collection Time: 05/05/23  6:18 PM  Result Value Ref Range   Glucose-Capillary 129 (H) 70 - 99 mg/dL    Comment: Glucose reference range applies only to samples taken after fasting for at least 8 hours.  Lithium level     Status: Abnormal   Collection Time: 05/05/23  6:30 PM  Result Value Ref Range   Lithium Lvl 0.11 (L) 0.60 - 1.20 mmol/L    Comment: Performed at [facility], [address], [place], [place] [address]    Blood Alcohol level:  Lab Results  Component Value Date   [facility] <10 05/04/2023   ETH <10 03/23/2023    Metabolic Labs: Lab Results  Component Value Date   HGBA1C 5.0 03/25/2023   MPG 96.8 03/25/2023   MPG 102.54 09/23/2022   Lab Results  Component Value Date   PROLACTIN 48.3 (H) 08/21/2015   Lab Results  Component Value Date   CHOL 122 03/25/2023   TRIG 112 03/25/2023   HDL 47 03/25/2023    CHOLHDL 2.6 03/25/2023   VLDL 22 03/25/2023   LDLCALC 53 03/25/2023   LDLCALC 44 08/21/2015    Sleep:Sleep: Poor   Physical Findings: AIMS: No  CIWA:    COWS:     Psychiatric Specialty Exam:  Presentation  General Appearance: Casual; Fairly Groomed  Eye Contact:Good  Speech:Clear and Coherent  Speech Volume:Normal  Handedness:Right   Mood and Affect  Mood:"on and off"  Affect:Congruent   Thought Process  Thought Processes:Coherent  Descriptions of Associations:Intact  Orientation:Full (Time, Place and Person)  Thought Content:WDL  History of Schizophrenia/Schizoaffective disorder:Yes  Duration of Psychotic Symptoms:Greater than six months  Hallucinations:Hallucinations: None  Ideas of Reference:None  Suicidal Thoughts:Suicidal Thoughts: Yes, Active (Endorses SI with plan to cut her wrists) SI Active Intent and/or Plan: With Intent; With Plan  Homicidal Thoughts:Homicidal Thoughts: No   Sensorium  Memory:Immediate Fair  Judgment:Poor  Insight:Poor   Executive Functions  Concentration:Fair  Attention Span:Fair  Recall:Fair  Fund of Knowledge:Fair  Language:Fair   Psychomotor Activity  Psychomotor Activity: Normal   Assets  Assets:Desire for Improvement   Sleep  Sleep: Fair    Physical Exam ROS  Physical Exam Constitutional:      Appearance: the patient is not toxic-appearing.  Pulmonary:     Effort: Pulmonary effort is normal.  Neurological:     General: No focal deficit present.     Mental Status: the patient is alert and oriented to person, place, and time.   Review of Systems  Respiratory:  Negative for shortness of breath.   Cardiovascular:  Negative for chest pain.  Gastrointestinal:  Negative for abdominal pain, constipation, diarrhea, nausea and vomiting.  Neurological:  Negative for headaches.   Blood pressure 111/77, pulse (!) 110, temperature 97.7 F (36.5 C), temperature source Oral, resp. rate 20,  height 5\' 4"  (1.626 m), weight 104.8 kg, SpO2 98%. Body mass index is 39.65 kg/m.  Treatment Plan Summary: Daily contact with patient to assess and evaluate symptoms and progress in treatment, Medication management, and Plan    ASSESSMENT: Holly [name] is a 27 y.o., female with a past psychiatric history significant for schizoaffective disorder and mild intellectual disability who presents to the [facility] from behavioral health urgent care for evaluation and management of worsening depression and SI with a plan to  cut her wrist to kill herself.  According to outside records, the patient presented there with worsening depression, SI with a plan to cut her wrist secondary to stress related to living situation at ALF  Suspect stressors related to guardian, living situation led to patient's current SI. Still currently reporting SI though is able to talk with staff and contract for safety. Unclear why current Li level low, patient reports compliance with medication. Could be in the setting of medication non-compliance, will recheck in 3 days.   Diagnoses / Active Problems: Principal Problem:   Schizoaffective disorder, bipolar type (HCC) Active Problems:   Mild intellectual disability   PLAN: Safety and Monitoring:  -- VOLUNTARY admission to inpatient psychiatric unit for safety, stabilization and treatment  -- Daily contact with patient to assess and evaluate symptoms and progress in treatment  -- Patient's case to be discussed in multi-disciplinary team meeting  -- Observation Level : q15 minute checks  -- Vital signs:  q12 hours  -- Precautions: suicide, elopement, and assault  2. Psychiatric Diagnoses and Treatment:   --Restart home Lithium carbonate 1200 at bedtime for mood stabilization   12/27 Li level 0.11   12/31 Li level pending   --Restart home celexa 20mg  daily for depression  --Change lybalvi to zyprexa 10mg  at bedtime as not on formulary   --Continue  trazodone 150 at bedtime for sleep   -- The risks/benefits/side-effects/alternatives to this medication were discussed in detail with the patient and time was given for questions. The patient consents to medication trial.              -- Metabolic profile and EKG monitoring obtained while on an atypical antipsychotic  BMI: 39.65 TSH:  Lab Results  Component Value Date   TSH 2.091 05/04/2023   Lipid Panel:  Lab Results  Component Value Date   CHOL 122 03/25/2023   HDL 47 03/25/2023   LDLCALC 53 03/25/2023   TRIG 112 03/25/2023   CHOLHDL 2.6 03/25/2023   HbgA1c:  Lab Results  Component Value Date   HGBA1C 5.0 03/25/2023   QTc: 457 12/26             -- Encouraged patient to participate in unit milieu and in scheduled group therapies   -- Short Term Goals: Ability to identify changes in lifestyle to reduce recurrence of condition will improve, Ability to verbalize feelings will improve, Ability to disclose and discuss suicidal ideas, Ability to demonstrate self-control will improve, Ability to identify and develop effective coping behaviors will improve, Ability to maintain clinical measurements within normal limits will improve, Compliance with prescribed medications will improve, and Ability to identify triggers associated with substance abuse/mental health issues will improve  -- Long Term Goals: Improvement in symptoms so as ready for discharge  Other PRNS:              -- start acetaminophen 650 mg every 6 hours as needed for mild to moderate pain, fever, and headaches              -- start hydroxyzine 25 mg three times a day as needed for anxiety              -- start aluminum-magnesium hydroxide + simethicone 30 mL every 4 hours as needed for heartburn  -- As needed agitation protocol in-place   3. Medical Issues Being Addressed:  --Restart home B12 for B12 deficiency --Restart home levothyroxine for hypothyroidism --Restart home metformin for diabetes  --Continue  pantoprazole 40mg   for GERD --At previous admission, had home medication of eliquis for history of PE. Per hospital discharge summary from 09/2022, eliquis was meant to be a 6 month course. Eliquis was not on current home medications.   4. Discharge Planning:   -- Social work and case management to assist with discharge planning and identification of hospital follow-up needs prior to discharge  -- Estimated LOS: 5-7 days  -- Discharge Concerns: Need to establish a safety plan; Medication compliance and effectiveness  -- Discharge Goals: Return home with outpatient referrals for mental health follow-up including medication management/psychotherapy   Please see attending attestation for additional details and finalized treatment plan.   Karie Fetch, MD PGY-2, Psychiatry Residency  12/28/202410:26 AM

## 2023-05-06 NOTE — Group Note (Unsigned)
Date:  05/07/2023 Time:  4:28 AM  Group Topic/Focus:  Wrap-Up Group:   The focus of this group is to help patients review their daily goal of treatment and discuss progress on daily workbooks.    Participation Level:  Active  Participation Quality:  Appropriate and Sharing  Affect:  Appropriate  Cognitive:  Appropriate  Insight: Appropriate and Limited  Engagement in Group:  Engaged  Modes of Intervention:  Activity and Socialization  Additional Comments:  Patient stated that his day was "alright". Patient shared that he slept majority of the day. Patient did not have goal. Patient participated in the group activity.   Kennieth Francois 05/07/2023, 4:28 AM

## 2023-05-06 NOTE — Progress Notes (Signed)
Patient rated her anxiety level 4/10 and her depression level 4/10 with 10 being the highest and 0 none. Pt endorsed suicidal ideation with a plan to cut her wrist. Pt verbally agreed not to harm herself. Pt denied HI and AVH. Medication compliant. Pt observed interacting well with some peers. Pt tolerated breakfast well. Safety maintained.  05/06/23 0853  Psych Admission Type (Psych Patients Only)  Admission Status Voluntary  Psychosocial Assessment  Patient Complaints Anxiety;Depression  Eye Contact Fair  Facial Expression Animated;Anxious  Affect Anxious;Depressed  Speech Logical/coherent  Interaction Childlike  Motor Activity Fidgety  Appearance/Hygiene Disheveled  Behavior Characteristics Cooperative;Anxious  Mood Depressed;Anxious  Thought Process  Coherency WDL  Content Blaming others  Delusions None reported or observed  Perception WDL  Hallucination None reported or observed  Judgment Poor  Confusion None  Danger to Self  Current suicidal ideation? Plan  Description of Suicide Plan " I plan to cut my wrist"  Self-Injurious Behavior Some self-injurious ideation observed or expressed.  No lethal plan expressed   Agreement Not to Harm Self Yes  Description of Agreement Verbal  Danger to Others  Danger to Others None reported or observed

## 2023-05-06 NOTE — Plan of Care (Signed)
  Problem: Education: Goal: Emotional status will improve Outcome: Progressing   Problem: Activity: Goal: Interest or engagement in activities will improve Outcome: Progressing Goal: Sleeping patterns will improve Outcome: Progressing   Problem: Coping: Goal: Ability to verbalize frustrations and anger appropriately will improve Outcome: Progressing Goal: Ability to demonstrate self-control will improve Outcome: Progressing   Problem: Safety: Goal: Periods of time without injury will increase Outcome: Progressing

## 2023-05-06 NOTE — Progress Notes (Signed)
   05/05/23 2100  Psych Admission Type (Psych Patients Only)  Admission Status Voluntary  Psychosocial Assessment  Patient Complaints Anxiety;Depression  Eye Contact Fair  Facial Expression Animated;Anxious  Affect Anxious;Depressed  Speech Logical/coherent  Interaction Childlike  Motor Activity Fidgety;Restless  Appearance/Hygiene Disheveled  Behavior Characteristics Cooperative;Anxious  Mood Depressed;Anxious  Thought Process  Coherency WDL  Content Blaming others  Delusions None reported or observed  Perception WDL  Hallucination None reported or observed  Judgment Poor  Confusion None  Danger to Self  Current suicidal ideation? Passive;Plan  Description of Suicide Plan Cut wrist.  Self-Injurious Behavior Some self-injurious ideation observed or expressed.  No lethal plan expressed   Agreement Not to Harm Self Yes  Description of Agreement Verbal  Danger to Others  Danger to Others None reported or observed

## 2023-05-07 DIAGNOSIS — F250 Schizoaffective disorder, bipolar type: Secondary | ICD-10-CM | POA: Diagnosis not present

## 2023-05-07 LAB — GLUCOSE, CAPILLARY
Glucose-Capillary: 97 mg/dL (ref 70–99)
Glucose-Capillary: 117 mg/dL — ABNORMAL HIGH (ref 70–99)

## 2023-05-07 MED ORDER — BACITRACIN-NEOMYCIN-POLYMYXIN OINTMENT TUBE: TOPICAL_OINTMENT | CUTANEOUS | Status: DC | PRN

## 2023-05-07 NOTE — Progress Notes (Signed)
°   05/06/23 2000  Psych Admission Type (Psych Patients Only)  Admission Status Voluntary  Psychosocial Assessment  Patient Complaints Anxiety;Depression  Eye Contact Fair  Facial Expression Animated;Anxious  Affect Anxious;Depressed (Anxiety 3/10, Depression 3/10)  Speech Logical/coherent  Interaction Childlike  Motor Activity Fidgety;Restless  Appearance/Hygiene Disheveled  Behavior Characteristics Cooperative;Anxious  Mood Depressed;Anxious  Thought Process  Coherency WDL  Content Blaming others  Delusions None reported or observed  Perception WDL  Hallucination None reported or observed  Judgment Poor  Confusion None  Danger to Self  Current suicidal ideation? Passive;Plan (Cut wrist)  Description of Suicide Plan Cut Wrist  Self-Injurious Behavior Some self-injurious ideation observed or expressed.  No lethal plan expressed   Agreement Not to Harm Self Yes  Description of Agreement Verbal  Danger to Others  Danger to Others None reported or observed

## 2023-05-07 NOTE — Group Note (Signed)
Date:  05/07/2023 Time:  1:02 PM  Group Topic/Focus:  Conflict Resolution:   The focus of this group is to discuss the conflict resolution process and how it may be used upon discharge. Goals Group:   The focus of this group is to help patients establish daily goals to achieve during treatment and discuss how the patient can incorporate goal setting into their daily lives to aide in recovery. Orientation:   The focus of this group is to educate the patient on the purpose and policies of crisis stabilization and provide a format to answer questions about their admission.  The group details unit policies and expectations of patients while admitted.    Participation Level:  Active  Participation Quality:  Appropriate  Affect:  Appropriate  Cognitive:  Appropriate  Insight: Appropriate  Engagement in Group:  Limited  Modes of Intervention:  Discussion and Education  Additional Comments:    Arnoldo Hooker 05/07/2023, 1:02 PM

## 2023-05-07 NOTE — Progress Notes (Signed)
Patient rated her depression level 3/10 and her anxiety level 4/10 with 10 being the highest and 0 none. Patient identified her goal for today as, " Staying positive". Pt endorsed suicidal ideation with a plan to cut her wrist and verbally agreed not to harm herself. Medication and group complaint. Pt observed interacting well with peers. Appetite good on shift. Safety maintained.  05/07/23 0905  Psych Admission Type (Psych Patients Only)  Admission Status Voluntary  Psychosocial Assessment  Patient Complaints Anxiety;Depression  Eye Contact Fair  Facial Expression Anxious;Animated  Affect Anxious;Depressed  Building surveyor Cooperative;Appropriate to situation  Mood Depressed;Anxious  Thought Process  Coherency WDL  Content Blaming others  Delusions None reported or observed  Perception WDL  Hallucination None reported or observed  Judgment Poor  Confusion None  Danger to Self  Current suicidal ideation? Plan  Description of Suicide Plan " Cut my wrist".  Self-Injurious Behavior Some self-injurious ideation observed or expressed.  No lethal plan expressed   Agreement Not to Harm Self Yes  Description of Agreement Verbal  Danger to Others  Danger to Others None reported or observed

## 2023-05-07 NOTE — [facility] Group Notes (Signed)
Adult Psychoeducational Group Note   Group Topic/Focus: Wrap-up Group  Participation Level:  Active  Participation Quality:  Appropriate  Affect:  Appropriate  Cognitive:  Appropriate  Insight: Appropriate  Engagement in Group:  Engaged  Modes of Intervention:  Education  Additional Comments:  Pt attend and participated in the wrap-up group. They stated their goal was to stay positive They did meet the goal today. They rated their day a 4.

## 2023-05-07 NOTE — Group Note (Signed)
LCSW Group Therapy Note  Group Date: 05/07/2023 Start Time: 1000 End Time: 1100   Type of Therapy and Topic:  Group Therapy - How To Cope with Nervousness about Discharge   Participation Level:  Did Not Attend   Description of Group This process group involved identification of patients' feelings about discharge. Some of them are scheduled to be discharged soon, while others are new admissions, but each of them was asked to share thoughts and feelings surrounding discharge from the hospital. One common theme was that they are excited at the prospect of going home, while another was that many of them are apprehensive about sharing why they were hospitalized. Patients were given the opportunity to discuss these feelings with their peers in preparation for discharge.  Therapeutic Goals  Patient will identify their overall feelings about pending discharge. Patient will think about how they might proactively address issues that they believe will once again arise once they get home (i.e. with parents). Patients will participate in discussion about having hope for change.   Summary of Patient Progress:  Did not attend   Therapeutic Modalities Cognitive Behavioral Therapy   Beatris Si, LCSW 05/07/2023  10:59 AM

## 2023-05-07 NOTE — Progress Notes (Signed)
[facility] MD Progress Note  05/07/2023 6:28 AM Holly [name]  MRN:  [id number]  Principal Problem: Schizoaffective disorder, bipolar type (HCC) Diagnosis: Principal Problem:   Schizoaffective disorder, bipolar type (HCC) Active Problems:   Mild intellectual disability  Reason for Admission:  Holly [name] is a 27 y.o., female with a past psychiatric history significant for schizoaffective disorder and mild intellectual disability who presents to the [facility] from behavioral health urgent care for evaluation and management of worsening depression and SI with a plan to cut her wrist to kill herself.  According to outside records, the patient presented there with worsening depression, SI with a plan to cut her wrist secondary to stress related to living situation at ALF  (admitted on 05/05/2023, total  LOS: 2 days )  Chart review: Overnight Events Vital signs: HR 112, BP wnl.  MAR was reviewed and patient was compliant with medications.  Patient received no PRNs. Reporting passive Si with plan to cut wrist to staff.  Attended 1/1 group.   Pertinent information discussed during bed progression:  Case was discussed in the multidisciplinary team. Nursing reported patient slept 8.25 hours. She reported passive SI to the nurse, plan to cut wrist.   Information Obtained Today During Patient Interview: Patient is evaluated at bedside. She reports that she slept 7-8 hours. She reports she ate grits, french toast, and milk for breakfast. She reports physical complaints of skin between her R 3rd and 4th toe is peeling. Examined this area, evidence of peeled skin with underlying skin underneath. Discussed with patient will order neosporin. She reports she tried putting a bandaid on it but it fell off. She also reports some mild diarrhea at the end of her BM yesterday. She reports this occasionally occurs. She states her mood is good, rates her depression as 3/10 and anxiety as 4/10.  She denies current SI, states last time was yesterday morning. Later in morning meeting nursing reports she reported passive SI with plan to cut writs. She reports her depression is 3/10 and anxiety 4/10. She reports increased anxiety related to worries about her group home and if her new one will be a good one. She reports there was only wrap up group today. She reports her positive thought that she came up with was that everything is going to be okay. She reports she talked to her old foster mom yesterday and it went well. She told her that they kicked her out and she reports the foster mom was empathetic towards her. She reports her goal is to try to make it throughout the day and to talk to someone when she is feeling down. She denies HI/AVH.   Past Psychiatric History:  Current Psychiatrist: Tamela Oddi Current Therapist: no therapist for 1.5 years Previous Psychiatric Diagnoses: schizoaffective disorder bipolar type, intellectual disability, ADHD, PTSD Current psychiatric medications:            Tylenol PRN, celexa 20, B12 1000 daily, synthroid 100 daily, lithium 1200 at bedtime, lybalvi 10 at bedtime, metformin [phone], trazodone 150 at bedtime  Psychiatric medication history/compliance: -prozac 20, celexa 20, amantadine 100 BID, abilify 10, lithium 900 at bedtime, trazodone 300, cogentin 1, doxepin 25, zyprexa 15 [facility] 08/2015: Schizoaffective disorder bipolar type  Meds:  prozac 20, lithium 450 CR BID, zyprexa 15  Psychiatric Hospitalization hx: 8 Prior Psychiatric Hospitalizations (2000, 2007x2, 2008, 2015, 2016, 2017x2, 03/2023) Psychotherapy hx: history of seeing therapist Neuromodulation history: none History of suicide (obtained from HPI): multiple suicide attempts,  most recently 2017 after ingesting 6-8 AA batteries as a suicidal gesture History of homicide or aggression (obtained in HPI): none  Family Psychiatric History:  Mother with alcoholism   Social History:  Living Situation:  Was living in ALF. Has legal guardian. [name], [phone].  Education: history of mild intellectual disability Occupational hx: reports she gets disability check from the state, has EBT  Marital Status: single  Children: denies  Legal: denies    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   Obesity    PTSD (post-traumatic stress disorder)    Family History:  Family History  Adopted: Yes    Current Medications: Current Facility-Administered Medications  Medication Dose Route Frequency Provider Last Rate Last Admin   acetaminophen (TYLENOL) tablet 650 mg  650 mg Oral Q6H PRN Bennett, Christal H, NP   650 mg at 05/05/23 1820   alum & mag hydroxide-simeth (MAALOX/MYLANTA) 200-200-20 MG/5ML suspension 30 mL  30 mL Oral Q4H PRN Bennett, Christal H, NP       citalopram (CELEXA) tablet 20 mg  20 mg Oral Daily Abbott Pao, Nadir, MD   20 mg at 05/06/23 0981   cyanocobalamin (VITAMIN B12) tablet 1,000 mcg  1,000 mcg Oral Daily Abbott Pao, Nadir, MD   1,000 mcg at 05/06/23 1914   hydrOXYzine (ATARAX) tablet 25 mg  25 mg Oral TID PRN Willeen Cass, Christal H, NP       levothyroxine (SYNTHROID) tablet 100 mcg  100 mcg Oral Q0600 Otho Bellows, RPH   100 mcg at 05/06/23 7829   lithium carbonate capsule 1,200 mg  1,200 mg Oral QHS Abbott Pao, Nadir, MD   1,200 mg at 05/06/23 2107   OLANZapine zydis (ZYPREXA) disintegrating tablet 10 mg  10 mg Oral Q8H PRN Bennett, Christal H, NP       And   LORazepam (ATIVAN) tablet 1 mg  1 mg Oral PRN Bennett, Christal H, NP       And   ziprasidone (GEODON) injection 20 mg  20 mg Intramuscular PRN Bennett, Christal H, NP       magnesium hydroxide (MILK OF MAGNESIA) suspension 30 mL  30 mL Oral Daily PRN Bennett,  Christal H, NP       metFORMIN (GLUCOPHAGE) tablet 1,000 mg  1,000 mg Oral QHS Bennett, Christal H, NP   1,000 mg at 05/06/23 2108   metFORMIN (GLUCOPHAGE) tablet 500 mg  500 mg Oral Q breakfast Bennett, Christal H, NP   500 mg at 05/06/23 0811   OLANZapine (ZYPREXA) tablet 10 mg  10 mg Oral QHS Attiah, Nadir, MD   10 mg at 05/06/23 2108   pantoprazole (PROTONIX) EC tablet 40 mg  40 mg Oral Daily Bennett, Christal H, NP   40 mg at 05/06/23 5621   traZODone (DESYREL) tablet 150 mg  150 mg Oral QHS Abbott Pao, Nadir, MD   150 mg at 05/06/23 2107    Lab Results:  Results for orders placed or performed during the hospital encounter of 05/05/23 (from the past 48 hours)  Glucose, capillary     Status: Abnormal   Collection Time: 05/05/23  6:18 PM  Result Value Ref Range   Glucose-Capillary  129 (H) 70 - 99 mg/dL    Comment: Glucose reference range applies only to samples taken after fasting for at least 8 hours.  Lithium level     Status: Abnormal   Collection Time: 05/05/23  6:30 PM  Result Value Ref Range   Lithium Lvl 0.11 (L) 0.60 - 1.20 mmol/L    Comment: Performed at [facility], [address] [place], [place] [address]  Glucose, capillary     Status: Abnormal   Collection Time: 05/06/23  5:05 PM  Result Value Ref Range   Glucose-Capillary 113 (H) 70 - 99 mg/dL    Comment: Glucose reference range applies only to samples taken after fasting for at least 8 hours.  Glucose, capillary     Status: None   Collection Time: 05/07/23  5:56 AM  Result Value Ref Range   Glucose-Capillary 97 70 - 99 mg/dL    Comment: Glucose reference range applies only to samples taken after fasting for at least 8 hours.   Comment 1 Notify RN    Comment 2 Document in Chart     Blood Alcohol level:  Lab Results  Component Value Date   ETH <10 05/04/2023   ETH <10 03/23/2023    Metabolic Labs: Lab Results  Component Value Date   HGBA1C 5.0 03/25/2023   MPG 96.8 03/25/2023   MPG  102.54 09/23/2022   Lab Results  Component Value Date   PROLACTIN 48.3 (H) 08/21/2015   Lab Results  Component Value Date   CHOL 122 03/25/2023   TRIG 112 03/25/2023   HDL 47 03/25/2023   CHOLHDL 2.6 03/25/2023   VLDL 22 03/25/2023   LDLCALC 53 03/25/2023   LDLCALC 44 08/21/2015    Sleep:No data recorded   Physical Findings: AIMS: No  CIWA:    COWS:     Psychiatric Specialty Exam:  Presentation  General Appearance: Casual; Fairly Groomed  Eye Contact:Good  Speech:Clear and Coherent  Speech Volume:Normal  Handedness:Right   Mood and Affect  Mood:"good"  Affect:Congruent   Thought Process  Thought Processes:Coherent  Descriptions of Associations:Intact  Orientation:Full (Time, Place and Person)  Thought Content:WDL  History of Schizophrenia/Schizoaffective disorder:Yes  Duration of Psychotic Symptoms:Greater than six months  Hallucinations: None  Ideas of Reference:None  Suicidal Thoughts: None, though per nursing she stated SI with plan to cut wrist, contracted for safety  Homicidal Thoughts: None   Sensorium  Memory:Immediate Fair  Judgment:Poor  Insight:Poor   Executive Functions  Concentration:Fair  Attention Span:Fair  Recall:Fair  Fund of Knowledge:Fair  Language:Fair   Psychomotor Activity  Psychomotor Activity: Normal   Assets  Assets:Desire for Improvement   Sleep  Sleep: Fair    Physical Exam ROS  Physical Exam Constitutional:      Appearance: the patient is not toxic-appearing.  Pulmonary:     Effort: Pulmonary effort is normal.  Neurological:     General: No focal deficit present.     Mental Status: the patient is alert and oriented to person, place, and time.  MSK: 1cm tear between 3rd and 4th right toe with no erythema surrounding it. No pus present.   Review of Systems  Respiratory:  Negative for shortness of breath.   Cardiovascular:  Negative for chest pain.  Gastrointestinal:  Negative  for abdominal pain, constipation, diarrhea, nausea and vomiting.  Neurological:  Negative for headaches.   Blood pressure 120/82, pulse (!) 112, temperature 98.7 F (37.1 C), temperature source Oral, resp. rate 18, height 5\' 4"  (1.626  m), weight 104.8 kg, SpO2 99%. Body mass index is 39.65 kg/m.  Treatment Plan Summary: Daily contact with patient to assess and evaluate symptoms and progress in treatment, Medication management, and Plan    ASSESSMENT: Holly [name] is a 27 y.o., female with a past psychiatric history significant for schizoaffective disorder and mild intellectual disability who presents to the [facility] from behavioral health urgent care for evaluation and management of worsening depression and SI with a plan to cut her wrist to kill herself.  According to outside records, the patient presented there with worsening depression, SI with a plan to cut her wrist secondary to stress related to living situation at ALF  Suspect stressors related to guardian, living situation led to patient's current SI. Still reporting SI to staff.  Unclear why current Li level low, patient reports compliance with medication. Could be in the setting of medication non-compliance, will recheck in 3 days. Will give neosporin for the tear between her toes. Continue to monitor for diarrhea. Difficult dispo given patient has been kicked out of her group home and currently have been unable to reach guardian.   Diagnoses / Active Problems: Principal Problem:   Schizoaffective disorder, bipolar type (HCC) Active Problems:   Mild intellectual disability   PLAN: Safety and Monitoring:  -- VOLUNTARY admission to inpatient psychiatric unit for safety, stabilization and treatment  -- Daily contact with patient to assess and evaluate symptoms and progress in treatment  -- Patient's case to be discussed in multi-disciplinary team meeting  -- Observation Level : q15 minute checks  -- Vital  signs:  q12 hours  -- Precautions: suicide, elopement, and assault  2. Psychiatric Diagnoses and Treatment:   --Restart home Lithium carbonate 1200 at bedtime for mood stabilization   12/27 Li level 0.11   12/31 Li level pending   --Restart home celexa 20mg  daily for depression  --Change lybalvi to zyprexa 10mg  at bedtime as not on formulary   --Continue trazodone 150 at bedtime for sleep   -- The risks/benefits/side-effects/alternatives to this medication were discussed in detail with the patient and time was given for questions. The patient consents to medication trial.              -- Metabolic profile and EKG monitoring obtained while on an atypical antipsychotic  BMI: 39.65 TSH:  Lab Results  Component Value Date   TSH 2.091 05/04/2023   Lipid Panel:  Lab Results  Component Value Date   CHOL 122 03/25/2023   HDL 47 03/25/2023   LDLCALC 53 03/25/2023   TRIG 112 03/25/2023   CHOLHDL 2.6 03/25/2023   HbgA1c:  Lab Results  Component Value Date   HGBA1C 5.0 03/25/2023   QTc: 457 12/26             -- Encouraged patient to participate in unit milieu and in scheduled group therapies   -- Short Term Goals: Ability to identify changes in lifestyle to reduce recurrence of condition will improve, Ability to verbalize feelings will improve, Ability to disclose and discuss suicidal ideas, Ability to demonstrate self-control will improve, Ability to identify and develop effective coping behaviors will improve, Ability to maintain clinical measurements within normal limits will improve, Compliance with prescribed medications will improve, and Ability to identify triggers associated with substance abuse/mental health issues will improve  -- Long Term Goals: Improvement in symptoms so as ready for discharge  Other PRNS:              --  start acetaminophen 650 mg every 6 hours as needed for mild to moderate pain, fever, and headaches              -- start hydroxyzine 25 mg three times a day  as needed for anxiety              -- start aluminum-magnesium hydroxide + simethicone 30 mL every 4 hours as needed for heartburn  -- As needed agitation protocol in-place   3. Medical Issues Being Addressed:  --Restart home B12 for B12 deficiency --Restart home levothyroxine for hypothyroidism --Restart home metformin for diabetes  --Continue pantoprazole 40mg  for GERD --At previous admission, had home medication of eliquis for history of PE. Per hospital discharge summary from 09/2022, eliquis was meant to be a 6 month course. Eliquis was not on current home medications.  -- Start neosporin for tear between her toes.   4. Discharge Planning:   -- Social work and case management to assist with discharge planning and identification of hospital follow-up needs prior to discharge  -- Estimated LOS: 5-7 days  -- Discharge Concerns: Need to establish a safety plan; Medication compliance and effectiveness  -- Discharge Goals: Return home with outpatient referrals for mental health follow-up including medication management/psychotherapy   Please see attending attestation for additional details and finalized treatment plan.   Karie Fetch, MD PGY-2, Psychiatry Residency  12/29/20246:28 AM

## 2023-05-07 NOTE — Plan of Care (Signed)
°  Problem: Coping: Goal: Ability to verbalize frustrations and anger appropriately will improve Outcome: Progressing Goal: Ability to demonstrate self-control will improve Outcome: Progressing   Problem: Activity: Goal: Interest or engagement in activities will improve Outcome: Progressing Goal: Sleeping patterns will improve Outcome: Progressing   Problem: Safety: Goal: Periods of time without injury will increase Outcome: Progressing   Problem: Medication: Goal: Compliance with prescribed medication regimen will improve Outcome: Progressing

## 2023-05-08 ENCOUNTER — Encounter (HOSPITAL_COMMUNITY): Payer: Self-pay

## 2023-05-08 DIAGNOSIS — F250 Schizoaffective disorder, bipolar type: Secondary | ICD-10-CM | POA: Diagnosis not present

## 2023-05-08 NOTE — Progress Notes (Signed)
[facility] MD Progress Note  05/08/2023 10:57 AM Holly [name]  MRN:  [id number]  Principal Problem: Schizoaffective disorder, bipolar type (HCC) Diagnosis: Principal Problem:   Schizoaffective disorder, bipolar type (HCC) Active Problems:   Mild intellectual disability  Reason for Admission:  Holly [name] is a 27 y.o., female with a past psychiatric history significant for schizoaffective disorder and mild intellectual disability who presents to the [facility] from behavioral health urgent care for evaluation and management of worsening depression and SI with a plan to cut her wrist to kill herself.  According to outside records, the patient presented there with worsening depression, SI with a plan to cut her wrist secondary to stress related to living situation at ALF  (admitted on 05/05/2023, total  LOS: 3 days )  Chart review: Overnight Events Vital signs: HR 98, BP wnl.  MAR was reviewed and patient was compliant with medications.  Patient received PRN Hydroxyzine before bed. .  Attended 1/1 group.   Pertinent information discussed during bed progression:  Case was discussed in the multidisciplinary team. Nursing reported patient slept 9.25 hours.   Information Obtained Today During Patient Interview: Patient is evaluated at bedside. She reports that she slept well. Patient reports that she is not having SI, HI, or AVH. Patient also denies urges to cut herself. Patient reports that she last had SI yesterday and endorses that it was related to the thought of going to a group home. Patient reports that she has anxiety when thinking about group homes and how she has no real control over where she goes. Patient and provider discuss coping skills other than SI. Patient reports that she can try coloring to distract herself, but she prefers listening to music, but she has not been allowed to use her money to buy an MP3 player. Patient endorses that she feels unsupported by  her guardian and feels it has been hard to get care, and this also makes her anxious about placement. Patient reports that today her anxiety and depression are both 4/10 since she is not able to control much. Patient reports she is trying to be patient when it comes to housing.  Past Psychiatric History:  Current Psychiatrist: Tamela Oddi Current Therapist: no therapist for 1.5 years Previous Psychiatric Diagnoses: schizoaffective disorder bipolar type, intellectual disability, ADHD, PTSD Current psychiatric medications:            Tylenol PRN, celexa 20, B12 1000 daily, synthroid 100 daily, lithium 1200 at bedtime, lybalvi 10 at bedtime, metformin [phone], trazodone 150 at bedtime  Psychiatric medication history/compliance: -prozac 20, celexa 20, amantadine 100 BID, abilify 10, lithium 900 at bedtime, trazodone 300, cogentin 1, doxepin 25, zyprexa 15 [facility] 08/2015: Schizoaffective disorder bipolar type  Meds:  prozac 20, lithium 450 CR BID, zyprexa 15  Psychiatric Hospitalization hx: 8 Prior Psychiatric Hospitalizations (2000, 2007x2, 2008, 2015, 2016, 2017x2, 03/2023) Psychotherapy hx: history of seeing therapist Neuromodulation history: none History of suicide (obtained from HPI): multiple suicide attempts, most recently 2017 after ingesting 6-8 AA batteries as a suicidal gesture History of homicide or aggression (obtained in HPI): none  Family Psychiatric History:  Mother with alcoholism   Social History:  Living Situation: Was living in ALF. Has legal guardian. [name], [phone].  Education: history of mild intellectual disability Occupational hx: reports she gets disability check from the state, has EBT  Marital Status: single  Children: denies  Legal: denies    Past Medical History:  Past Medical History:  Diagnosis Date  ADHD (attention deficit hyperactivity disorder)    B12 deficiency 03/26/2023   Bipolar 1 disorder (HCC)    Depression    Diabetes mellitus    Gluten  free diet    Hypothyroidism    Mood disorder (HCC)    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   Obesity    PTSD (post-traumatic stress disorder)    Family History:  Family History  Adopted: Yes    Current Medications: Current Facility-Administered Medications  Medication Dose Route Frequency Provider Last Rate Last Admin   acetaminophen (TYLENOL) tablet 650 mg  650 mg Oral Q6H PRN Bennett, Christal H, NP   650 mg at 05/05/23 1820   alum & mag hydroxide-simeth (MAALOX/MYLANTA) 200-200-20 MG/5ML suspension 30 mL  30 mL Oral Q4H PRN Bennett, Christal H, NP       citalopram (CELEXA) tablet 20 mg  20 mg Oral Daily Abbott Pao, Nadir, MD   20 mg at 05/08/23 0748   cyanocobalamin (VITAMIN B12) tablet 1,000 mcg  1,000 mcg Oral Daily Abbott Pao, Nadir, MD   1,000 mcg at 05/08/23 0747   hydrOXYzine (ATARAX) tablet 25 mg  25 mg Oral TID PRN Thurston Hole H, NP   25 mg at 05/07/23 2121   levothyroxine (SYNTHROID) tablet 100 mcg  100 mcg Oral Q0600 Otho Bellows, RPH   100 mcg at 05/08/23 1610   lithium carbonate capsule 1,200 mg  1,200 mg Oral QHS Abbott Pao, Nadir, MD   1,200 mg at 05/07/23 2119   OLANZapine zydis (ZYPREXA) disintegrating tablet 10 mg  10 mg Oral Q8H PRN Bennett, Christal H, NP       And   LORazepam (ATIVAN) tablet 1 mg  1 mg Oral PRN Bennett, Christal H, NP       And   ziprasidone (GEODON) injection 20 mg  20 mg Intramuscular PRN Bennett, Christal H, NP       magnesium hydroxide (MILK OF MAGNESIA) suspension 30 mL  30 mL Oral Daily PRN Bennett, Christal H, NP       metFORMIN (GLUCOPHAGE) tablet 1,000 mg  1,000 mg Oral QHS Bennett, Christal H, NP   1,000 mg at 05/07/23 2119   metFORMIN (GLUCOPHAGE) tablet 500 mg  500 mg Oral Q breakfast Bennett, Christal H, NP   500 mg at 05/08/23 0747   neomycin-bacitracin-polymyxin (NEOSPORIN) ointment   Topical PRN Karie Fetch, MD       OLANZapine [facility]) tablet 10 mg  10 mg Oral QHS Attiah, Nadir, MD   10 mg at 05/07/23 2120   pantoprazole  (PROTONIX) EC tablet 40 mg  40 mg Oral Daily Bennett, Christal H, NP   40 mg at 05/08/23 0747   traZODone (DESYREL) tablet 150 mg  150 mg Oral QHS Attiah, Nadir, MD   150 mg at 05/07/23 2120    Lab Results:  Results for orders placed or performed during the hospital encounter of 05/05/23 (from the past 48 hours)  Glucose, capillary     Status: Abnormal   Collection Time: 05/06/23  5:05 PM  Result Value Ref Range   Glucose-Capillary 113 (H) 70 - 99 mg/dL    Comment: Glucose reference range applies only to samples taken after fasting for at least 8 hours.  Glucose, capillary     Status: None   Collection Time: 05/07/23  5:56 AM  Result Value Ref Range   Glucose-Capillary 97 70 - 99 mg/dL    Comment: Glucose reference range applies only to samples taken after fasting for at  least 8 hours.   Comment 1 Notify RN    Comment 2 Document in Chart   Glucose, capillary     Status: Abnormal   Collection Time: 05/07/23  5:15 PM  Result Value Ref Range   Glucose-Capillary 117 (H) 70 - 99 mg/dL    Comment: Glucose reference range applies only to samples taken after fasting for at least 8 hours.    Blood Alcohol level:  Lab Results  Component Value Date   ETH <10 05/04/2023   ETH <10 03/23/2023    Metabolic Labs: Lab Results  Component Value Date   HGBA1C 5.0 03/25/2023   MPG 96.8 03/25/2023   MPG 102.54 09/23/2022   Lab Results  Component Value Date   PROLACTIN 48.3 (H) 08/21/2015   Lab Results  Component Value Date   CHOL 122 03/25/2023   TRIG 112 03/25/2023   HDL 47 03/25/2023   CHOLHDL 2.6 03/25/2023   VLDL 22 03/25/2023   LDLCALC 53 03/25/2023   LDLCALC 44 08/21/2015    Sleep:Sleep: Good Number of Hours of Sleep: 9.25    Physical Findings: AIMS: No  CIWA:    COWS:     Psychiatric Specialty Exam:  Presentation  General Appearance: Appropriate for Environment  Eye Contact:Good  Speech:Clear and Coherent  Speech Volume:Normal  Handedness:Right   Mood  and Affect  Mood:"good"  Affect:Appropriate   Thought Process  Thought Processes:Coherent  Descriptions of Associations:Intact  Orientation:Full (Time, Place and Person)  Thought Content:Logical  History of Schizophrenia/Schizoaffective disorder:Yes  Duration of Psychotic Symptoms:Greater than six months  Hallucinations: None  Ideas of Reference:None  Suicidal Thoughts: None, though per nursing she stated SI with plan to cut wrist, contracted for safety  Homicidal Thoughts: None   Sensorium  Memory:Immediate Good; Recent Fair  Judgment:Impaired  Insight:Poor   Executive Functions  Concentration:Fair  Attention Span:Fair  Recall:Fair  Fund of Knowledge:Fair  Language:Good   Psychomotor Activity  Psychomotor Activity: Normal   Assets  Assets:Desire for Improvement; Resilience   Sleep  Sleep: Fair    Physical Exam HENT:     Head: Normocephalic and atraumatic.  Pulmonary:     Effort: Pulmonary effort is normal.  Neurological:     Mental Status: She is alert and oriented to person, place, and time.    Review of Systems  Psychiatric/Behavioral:  Negative for depression, hallucinations and suicidal ideas. The patient is not nervous/anxious and does not have insomnia.     Physical Exam Constitutional:      Appearance: the patient is not toxic-appearing.  Pulmonary:     Effort: Pulmonary effort is normal.  Neurological:     General: No focal deficit present.     Mental Status: the patient is alert and oriented to person, place, and time.  MSK: 1cm tear between 3rd and 4th right toe with no erythema surrounding it. No pus present.   Review of Systems  Respiratory:  Negative for shortness of breath.   Cardiovascular:  Negative for chest pain.  Gastrointestinal:  Negative for abdominal pain, constipation, diarrhea, nausea and vomiting.  Neurological:  Negative for headaches.   Blood pressure 108/73, pulse 98, temperature 98.1 F (36.7 C),  temperature source Oral, resp. rate 18, height 5\' 4"  (1.626 m), weight 104.8 kg, SpO2 100%. Body mass index is 39.65 kg/m.  Treatment Plan Summary: Daily contact with patient to assess and evaluate symptoms and progress in treatment, Medication management, and Plan    ASSESSMENT: Holly [name] is a 27 y.o., female with  a past psychiatric history significant for schizoaffective disorder and mild intellectual disability who presents to the [facility] from behavioral health urgent care for evaluation and management of worsening depression and SI with a plan to cut her wrist to kill herself.  According to outside records, the patient presented there with worsening depression, SI with a plan to cut her wrist secondary to stress related to living situation at ALF  Suspect stressors related to guardian, living situation led to patient's recent SI. Patient is attempting to use coping skills today and is not having SI currently. Patient does endorse feeling frustrated and struggling with limited control over her decisions. No medication adjustments at this time, continue to promote groups and behavior modifications.   Diagnoses / Active Problems: Principal Problem:   Schizoaffective disorder, bipolar type (HCC) Active Problems:   Mild intellectual disability   PLAN: Safety and Monitoring:  -- VOLUNTARY admission to inpatient psychiatric unit for safety, stabilization and treatment  -- Daily contact with patient to assess and evaluate symptoms and progress in treatment  -- Patient's case to be discussed in multi-disciplinary team meeting  -- Observation Level : q15 minute checks  -- Vital signs:  q12 hours  -- Precautions: suicide, elopement, and assault  2. Psychiatric Diagnoses and Treatment:   --Continue  home Lithium carbonate 1200 at bedtime for mood stabilization   12/27 Li level 0.11   12/31 Li level pending   --Continue home celexa 20mg  daily for  depression  --Change lybalvi to zyprexa 10mg  at bedtime as not on formulary while in hospital  --Continue trazodone 150 at bedtime for sleep   -- The risks/benefits/side-effects/alternatives to this medication were discussed in detail with the patient and time was given for questions. The patient consents to medication trial.              -- Metabolic profile and EKG monitoring obtained while on an atypical antipsychotic  BMI: 39.65 TSH:  Lab Results  Component Value Date   TSH 2.091 05/04/2023   Lipid Panel:  Lab Results  Component Value Date   CHOL 122 03/25/2023   HDL 47 03/25/2023   LDLCALC 53 03/25/2023   TRIG 112 03/25/2023   CHOLHDL 2.6 03/25/2023   HbgA1c:  Lab Results  Component Value Date   HGBA1C 5.0 03/25/2023   QTc: 457 12/26             -- Encouraged patient to participate in unit milieu and in scheduled group therapies   -- Short Term Goals: Ability to identify changes in lifestyle to reduce recurrence of condition will improve, Ability to verbalize feelings will improve, Ability to disclose and discuss suicidal ideas, Ability to demonstrate self-control will improve, Ability to identify and develop effective coping behaviors will improve, Ability to maintain clinical measurements within normal limits will improve, Compliance with prescribed medications will improve, and Ability to identify triggers associated with substance abuse/mental health issues will improve  -- Long Term Goals: Improvement in symptoms so as ready for discharge  Other PRNS:              -- start acetaminophen 650 mg every 6 hours as needed for mild to moderate pain, fever, and headaches              -- start hydroxyzine 25 mg three times a day as needed for anxiety              -- start aluminum-magnesium hydroxide + simethicone 30 mL every 4  hours as needed for heartburn  -- As needed agitation protocol in-place   3. Medical Issues Being Addressed:  --Continue home B12 for B12  deficiency --Continue home levothyroxine for hypothyroidism --Continue home metformin for diabetes  --Continue pantoprazole 40mg  for GERD --At previous admission, had home medication of eliquis for history of PE. Per hospital discharge summary from 09/2022, eliquis was meant to be a 6 month course. Eliquis was not on current home medications.  -- Continue neosporin for tear between her toes.   4. Discharge Planning:   -- Social work and case management to assist with discharge planning and identification of hospital follow-up needs prior to discharge  -- Estimated LOS: 5-7 days  -- Discharge Concerns: Need to establish a safety plan; Medication compliance and effectiveness  -- Discharge Goals: Return home with outpatient referrals for mental health follow-up including medication management/psychotherapy   Please see attending attestation for additional details and finalized treatment plan.   Eliseo Gum,  MD PGY-4, Psychiatry Residency  12/30/202410:57 AM

## 2023-05-08 NOTE — Progress Notes (Signed)
   05/07/23 2032  Psych Admission Type (Psych Patients Only)  Admission Status Voluntary  Psychosocial Assessment  Patient Complaints Anxiety;Depression  Eye Contact Fair  Facial Expression Animated  Affect Anxious;Depressed  Speech Logical/coherent  Interaction Assertive  Motor Activity Restless  Appearance/Hygiene Disheveled  Behavior Characteristics Cooperative;Appropriate to situation  Mood Depressed;Anxious  Aggressive Behavior  Effect No apparent injury  Thought Process  Coherency WDL  Content WDL  Delusions None reported or observed  Perception WDL  Hallucination None reported or observed  Judgment Poor  Confusion None  Danger to Self  Current suicidal ideation? Denies  Agreement Not to Harm Self Yes  Description of Agreement verbal  Danger to Others  Danger to Others None reported or observed

## 2023-05-08 NOTE — Plan of Care (Signed)
  Problem: Education: Goal: Knowledge of [facility] Education information/materials will improve Outcome: Progressing Goal: Emotional status will improve Outcome: Progressing Goal: Mental status will improve Outcome: Progressing Goal: Verbalization of understanding the information provided will improve Outcome: Progressing   Problem: Coping: Goal: Ability to demonstrate self-control will improve Outcome: Progressing   Problem: Safety: Goal: Periods of time without injury will increase Outcome: Progressing

## 2023-05-08 NOTE — Progress Notes (Signed)
D: Patient is alert, oriented, and cooperative. Denies SI, HI, AVH, and verbally contracts for safety. Patient reports she slept good last night with sleeping medication. Patient reports her appetite as good, energy level as normal, and concentration as good. Patient rates her depression 3/10, hopelessness 3/10, and anxiety 3/10. Patient denies physical symptoms/pain.    A: Scheduled medications administered per MD order. Support provided. Patient educated on safety on the unit and medications. Routine safety checks every 15 minutes. Patient stated understanding to tell nurse about any new physical symptoms. Patient understands to tell staff of any needs.     R: No adverse drug reactions noted. Patient remains safe at this time and will continue to monitor.    05/08/23 0900  Psych Admission Type (Psych Patients Only)  Admission Status Voluntary  Psychosocial Assessment  Patient Complaints Anxiety;Depression  Eye Contact Fair  Facial Expression Animated;Anxious  Affect Anxious;Depressed  Speech Logical/coherent  Interaction Childlike;Assertive  Motor Activity Fidgety  Appearance/Hygiene Designer, industrial/product Cooperative;Appropriate to situation  Mood Depressed;Anxious  Thought Process  Coherency WDL  Content WDL  Delusions None reported or observed  Perception WDL  Hallucination None reported or observed  Judgment Poor  Confusion None  Danger to Self  Current suicidal ideation? Denies  Self-Injurious Behavior No self-injurious ideation or behavior indicators observed or expressed   Agreement Not to Harm Self Yes  Description of Agreement verbal  Danger to Others  Danger to Others None reported or observed

## 2023-05-08 NOTE — [facility] Group Notes (Signed)

## 2023-05-08 NOTE — BH IP Treatment Plan (Signed)
Interdisciplinary Treatment and Diagnostic Plan Update  05/08/2023 Time of Session: 10:30 AM Holly [name] MRN: [id number]  Principal Diagnosis: Schizoaffective disorder, bipolar type (HCC)  Secondary Diagnoses: Principal Problem:   Schizoaffective disorder, bipolar type (HCC) Active Problems:   Mild intellectual disability   Current Medications:  Current Facility-Administered Medications  Medication Dose Route Frequency Provider Last Rate Last Admin   acetaminophen (TYLENOL) tablet 650 mg  650 mg Oral Q6H PRN Willeen Cass, Christal H, NP   650 mg at 05/05/23 1820   alum & mag hydroxide-simeth (MAALOX/MYLANTA) 200-200-20 MG/5ML suspension 30 mL  30 mL Oral Q4H PRN Bennett, Christal H, NP       citalopram (CELEXA) tablet 20 mg  20 mg Oral Daily Abbott Pao, Nadir, MD   20 mg at 05/08/23 0748   cyanocobalamin (VITAMIN B12) tablet 1,000 mcg  1,000 mcg Oral Daily Abbott Pao, Nadir, MD   1,000 mcg at 05/08/23 0747   hydrOXYzine (ATARAX) tablet 25 mg  25 mg Oral TID PRN Thurston Hole H, NP   25 mg at 05/07/23 2121   levothyroxine (SYNTHROID) tablet 100 mcg  100 mcg Oral Q0600 Otho Bellows, RPH   100 mcg at 05/08/23 4132   lithium carbonate capsule 1,200 mg  1,200 mg Oral QHS Attiah, Nadir, MD   1,200 mg at 05/07/23 2119   OLANZapine zydis (ZYPREXA) disintegrating tablet 10 mg  10 mg Oral Q8H PRN Bennett, Christal H, NP       And   LORazepam (ATIVAN) tablet 1 mg  1 mg Oral PRN Bennett, Christal H, NP       And   ziprasidone (GEODON) injection 20 mg  20 mg Intramuscular PRN Bennett, Christal H, NP       magnesium hydroxide (MILK OF MAGNESIA) suspension 30 mL  30 mL Oral Daily PRN Bennett, Christal H, NP       metFORMIN (GLUCOPHAGE) tablet 1,000 mg  1,000 mg Oral QHS Bennett, Christal H, NP   1,000 mg at 05/07/23 2119   metFORMIN (GLUCOPHAGE) tablet 500 mg  500 mg Oral Q breakfast Bennett, Christal H, NP   500 mg at 05/08/23 0747   neomycin-bacitracin-polymyxin (NEOSPORIN) ointment   Topical PRN  Karie Fetch, MD       OLANZapine [facility]) tablet 10 mg  10 mg Oral QHS Attiah, Nadir, MD   10 mg at 05/07/23 2120   pantoprazole (PROTONIX) EC tablet 40 mg  40 mg Oral Daily Bennett, Christal H, NP   40 mg at 05/08/23 0747   traZODone (DESYREL) tablet 150 mg  150 mg Oral QHS Attiah, Nadir, MD   150 mg at 05/07/23 2120   PTA Medications: Medications Prior to Admission  Medication Sig Dispense Refill Last Dose/Taking   citalopram (CELEXA) 20 MG tablet Take 20 mg by mouth daily.   05/04/2023 at  8:00 AM   Clindamy-Benzoyl Per-Niacinam 1-2.5-4 % GEL Apply 1 Application topically daily as needed (Apply to facial acne).   Past Week   clotrimazole-betamethasone (LOTRISONE) cream Apply 1 Application topically 2 (two) times daily. Apply a thin layer to affected area   05/04/2023   levothyroxine (SYNTHROID) 100 MCG tablet Take 100 mcg by mouth daily.   05/04/2023 at  8:00 AM   linaclotide (LINZESS) 290 MCG CAPS capsule Take 290 mcg by mouth daily.   05/04/2023 at  8:00 AM   lithium 300 MG tablet Take 900 mg by mouth at bedtime.   05/03/2023   metFORMIN (GLUCOPHAGE) 1000 MG tablet Take 1,000 mg by  mouth at bedtime.   05/03/2023   metFORMIN (GLUCOPHAGE) 500 MG tablet Take 500 mg by mouth daily.   05/04/2023 at  8:00 AM   OLANZapine-Samidorphan (LYBALVI) 10-10 MG TABS Take 1 tablet by mouth at bedtime.   05/03/2023   omeprazole (PRILOSEC) 20 MG capsule Take 20 mg by mouth daily.   05/04/2023 at  8:00 AM   Plecanatide (TRULANCE) 3 MG TABS Take 3 mg by mouth daily as needed (For constipation).   Past Month   polyethylene glycol powder (GOODSENSE CLEARLAX) 17 GM/SCOOP powder Take 17 g by mouth daily as needed for moderate constipation.   Past Week   Selenium Sulfide 2.25 % SHAM Apply 1 Application topically 2 (two) times a week.   Past Week   traZODone (DESYREL) 150 MG tablet Take 150 mg by mouth at bedtime.   05/03/2023    Patient Stressors: Financial difficulties   Legal issue   Traumatic event     Patient Strengths: Ability for insight  Motivation for treatment/growth  Physical Health   Treatment Modalities: Medication Management, Group therapy, Case management,  1 to 1 session with clinician, Psychoeducation, Recreational therapy.   Physician Treatment Plan for Primary Diagnosis: Schizoaffective disorder, bipolar type (HCC) Long Term Goal(s): Improvement in symptoms so as ready for discharge   Short Term Goals: Ability to identify changes in lifestyle to reduce recurrence of condition will improve Ability to verbalize feelings will improve Ability to disclose and discuss suicidal ideas Ability to demonstrate self-control will improve Ability to identify and develop effective coping behaviors will improve Ability to maintain clinical measurements within normal limits will improve Compliance with prescribed medications will improve Ability to identify triggers associated with substance abuse/mental health issues will improve  Medication Management: Evaluate patient's response, side effects, and tolerance of medication regimen.  Therapeutic Interventions: 1 to 1 sessions, Unit Group sessions and Medication administration.  Evaluation of Outcomes: Not Progressing  Physician Treatment Plan for Secondary Diagnosis: Principal Problem:   Schizoaffective disorder, bipolar type (HCC) Active Problems:   Mild intellectual disability  Long Term Goal(s): Improvement in symptoms so as ready for discharge   Short Term Goals: Ability to identify changes in lifestyle to reduce recurrence of condition will improve Ability to verbalize feelings will improve Ability to disclose and discuss suicidal ideas Ability to demonstrate self-control will improve Ability to identify and develop effective coping behaviors will improve Ability to maintain clinical measurements within normal limits will improve Compliance with prescribed medications will improve Ability to identify triggers  associated with substance abuse/mental health issues will improve     Medication Management: Evaluate patient's response, side effects, and tolerance of medication regimen.  Therapeutic Interventions: 1 to 1 sessions, Unit Group sessions and Medication administration.  Evaluation of Outcomes: Not Progressing   RN Treatment Plan for Primary Diagnosis: Schizoaffective disorder, bipolar type (HCC) Long Term Goal(s): Knowledge of disease and therapeutic regimen to maintain health will improve  Short Term Goals: Ability to remain free from injury will improve, Ability to verbalize frustration and anger appropriately will improve, Ability to demonstrate self-control, Ability to participate in decision making will improve, Ability to verbalize feelings will improve, Ability to disclose and discuss suicidal ideas, Ability to identify and develop effective coping behaviors will improve, and Compliance with prescribed medications will improve  Medication Management: RN will administer medications as ordered by provider, will assess and evaluate patient's response and provide education to patient for prescribed medication. RN will report any adverse and/or side effects to prescribing provider.  Therapeutic Interventions: 1 on 1 counseling sessions, Psychoeducation, Medication administration, Evaluate responses to treatment, Monitor vital signs and CBGs as ordered, Perform/monitor CIWA, COWS, AIMS and Fall Risk screenings as ordered, Perform wound care treatments as ordered.  Evaluation of Outcomes: Not Progressing   LCSW Treatment Plan for Primary Diagnosis: Schizoaffective disorder, bipolar type (HCC) Long Term Goal(s): Safe transition to appropriate next level of care at discharge, Engage patient in therapeutic group addressing interpersonal concerns.  Short Term Goals: Engage patient in aftercare planning with referrals and resources, Increase social support, Increase ability to appropriately  verbalize feelings, Increase emotional regulation, Facilitate acceptance of mental health diagnosis and concerns, Facilitate patient progression through stages of change regarding substance use diagnoses and concerns, Identify triggers associated with mental health/substance abuse issues, and Increase skills for wellness and recovery  Therapeutic Interventions: Assess for all discharge needs, 1 to 1 time with Social worker, Explore available resources and support systems, Assess for adequacy in community support network, Educate family and significant other(s) on suicide prevention, Complete Psychosocial Assessment, Interpersonal group therapy.  Evaluation of Outcomes: Not Progressing   Progress in Treatment: Attending groups: Yes. Participating in groups: Yes. Taking medication as prescribed: Yes. Toleration medication: Yes. Family/Significant other contact made: No, will contact legal guardian / DSS social worker: Ty P [phone] Patient understands diagnosis: Yes. Discussing patient identified problems/goals with staff: Yes. Medical problems stabilized or resolved: Yes. Denies suicidal/homicidal ideation: Yes. Issues/concerns per patient self-inventory: No  New problem(s) identified:  no  New Short Term/Long Term Goal(s):  Patient Goals:  Patient would like to participate in therapy to develop skill to manage suicidal thoughts.  "I was feeling suicidal because they were calling me fat and mistreating me."      medication stabilization, elimination of SI thoughts, development of comprehensive mental wellness plan.    Discharge Plan or Barriers:  Patient recently admitted. CSW will continue to follow and assess for appropriate referrals and possible discharge planning.    Reason for Continuation of Hospitalization: Anxiety Depression Suicidal ideation  Estimated Length of Stay:  Last 3 [place] Suicide Severity Risk Score: Flowsheet Row [facility] (Current[facility] from [facility]04/2023 in [facility]Discharged[facility]  C-SSRS RISK CATEGORY High Risk High Risk High Risk       Last PHQ 2/9 Scores:     No data to display          Scribe for Treatment Team: Alla Feeling, LCSWA 05/08/2023 12:26 PM

## 2023-05-08 NOTE — Progress Notes (Signed)
Writer explain to [employer] she can not bring 3 container cereal and two container milk.There was plenty of time to eat the cereal in the cafeteria. Food is not allow in no patient's room. Holly [name] said she always bring food back from the cafeteria everyday. Writer explain to [employer] she can only bring food from the vending machine. RN notify.

## 2023-05-08 NOTE — Group Note (Signed)
Recreation Therapy Group Note   Group Topic:Team Building  Group Date: 05/08/2023 Start Time: 0930 End Time: 1000 Facilitators: Eugena Rhue-McCall, LRT,CTRS Location: [address] Dayroom   Group Topic: Communication, Team Building, Problem Solving  Goal Area(s) Addresses:  Patient will effectively work with peer towards shared goal.  Patient will identify skills used to make activity successful.  Patient will share challenges and verbalize solution-driven approaches used. Patient will identify how skills used during activity can be used to reach post d/c goals.   Intervention: STEM Activity   Group Description: [employer]. Patients were provided the following materials: 2 drinking straws, 5 rubber bands, 5 paper clips, 2 index cards and 2 drinking cups. Using the provided materials patients were asked to build a launching mechanism to launch a ping pong ball across the room, approximately 10 feet. Patients were divided into teams of 3-5. Instructions required all materials be incorporated into the device, functionality of items left to the peer group's discretion.  Education: Pharmacist, community, Scientist, physiological, Air cabin crew, Building control surveyor.   Education Outcome: Acknowledges education/In group clarification offered/Needs additional education.    Affect/Mood: N/A   Participation Level: Did not attend    Clinical Observations/Individualized Feedback:      Plan: Continue to engage patient in RT group sessions 2-3x/week.   Priya Matsen-McCall, LRT,CTRS 05/08/2023 12:32 PM

## 2023-05-09 DIAGNOSIS — F250 Schizoaffective disorder, bipolar type: Secondary | ICD-10-CM | POA: Diagnosis not present

## 2023-05-09 LAB — GLUCOSE, CAPILLARY
Glucose-Capillary: 92 mg/dL (ref 70–99)
Glucose-Capillary: 124 mg/dL — ABNORMAL HIGH (ref 70–99)

## 2023-05-09 MED ORDER — OLANZAPINE 5 MG PO TBDP: 5.0000 mg | ORAL_TABLET | Freq: Three times a day (TID) | ORAL | Status: DC | PRN

## 2023-05-09 MED ORDER — OLANZAPINE 10 MG IM SOLR: 5.0000 mg | Freq: Three times a day (TID) | INTRAMUSCULAR | Status: DC | PRN

## 2023-05-09 MED ORDER — OLANZAPINE 10 MG IM SOLR: 10.0000 mg | Freq: Three times a day (TID) | INTRAMUSCULAR | Status: DC | PRN

## 2023-05-09 MED ORDER — ONDANSETRON 4 MG PO TBDP: 4.0000 mg | ORAL_TABLET | Freq: Once | ORAL | Status: DC | PRN

## 2023-05-09 NOTE — Group Note (Signed)
 Recreation Therapy Group Note   Group Topic:Animal Assisted Therapy   Group Date: 05/09/2023 Start Time: 0946 End Time: 1030 Facilitators: Cydney Alvarenga-McCall, LRT,CTRS Location: [address] Dayroom   Animal-Assisted Activity (AAA) Program Checklist/Progress Notes Patient Eligibility Criteria Checklist & Daily Group note for Rec Tx Intervention  AAA/T Program Assumption of Risk Form signed by Patient/ or Parent Legal Guardian Yes  Patient is free of allergies or severe asthma Yes  Patient reports no fear of animals Yes  Patient reports no history of cruelty to animals Yes  Patient understands his/her participation is voluntary Yes  Patient washes hands before animal contact Yes  Patient washes hands after animal contact Yes  Education: Hand Washing, Appropriate Animal Interaction   Education Outcome: Acknowledges education.    Affect/Mood: Appropriate   Participation Level: Engaged   Participation Quality: Independent   Behavior: Appropriate   Speech/Thought Process: Focused   Insight: Good   Judgement: Good   Modes of Intervention: Teaching Laboratory Technician   Patient Response to Interventions:  Engaged   Education Outcome:  In group clarification offered    Clinical Observations/Individualized Feedback: Patient attended session and interacted appropriately with therapy dog and peers. Patient asked appropriate questions about therapy dog and his training. Patient shared stories about their pets at home with group.    Plan: Continue to engage patient in RT group sessions 2-3x/week.   Roxana Lai-McCall LRT,CTRS 05/09/2023 12:18 PM

## 2023-05-09 NOTE — Group Note (Signed)
 LCSW Group Therapy Note  Group Date: 05/09/2023 Start Time: 1100 End Time: 1200   Type of Therapy and Topic:  Group Therapy:  Stages of Change  Participation Level:  patient was present and actively participated in the discussion   Description of Group: Discussed 5 stages of change, for example, when developing new habits and overcoming addictions:  pre-contemplation, contemplation, preparation, action and maintenance.  Discussed handout, why change is hard and completed a worksheet.  Therapeutic Goals: Patient will identify stage of change they are experiencing. Patient will recognize obstacles and triggers when making progress.      Summary of Patient Progress:   Patient was polite, and shared her personal experiences and goals.  Therapeutic Modalities:  Elements of Motivational Interviewing (MI)  Christinamarie Tall O Toby Ayad, LCSWA 05/09/2023  2:22 PM
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
## 2023-05-09 NOTE — Progress Notes (Signed)
   05/08/23 2130  Psych Admission Type (Psych Patients Only)  Admission Status Voluntary  Psychosocial Assessment  Patient Complaints Anxiety;Depression  Eye Contact Fair  Facial Expression Anxious  Affect Anxious;Depressed  Speech Logical/coherent  Interaction Assertive;Childlike  Motor Activity Restless  Appearance/Hygiene Disheveled  Behavior Characteristics Cooperative;Appropriate to situation  Mood Depressed;Anxious  Thought Process  Coherency WDL  Content WDL  Delusions None reported or observed  Perception WDL  Hallucination None reported or observed  Judgment Poor  Confusion None  Danger to Self  Current suicidal ideation? Denies  Self-Injurious Behavior No self-injurious ideation or behavior indicators observed or expressed   Agreement Not to Harm Self Yes  Description of Agreement Verbal  Danger to Others  Danger to Others None reported or observed

## 2023-05-09 NOTE — Plan of Care (Signed)

## 2023-05-09 NOTE — [facility] Suicide Risk Assessment (Addendum)
 [facility] INPATIENT:  Family/Significant Other Suicide Prevention Education  Suicide Prevention Education:  Education Completed; Holly [name], [place], [phone],  (name of family member/significant other) has been identified by the patient as the family member/significant other with whom the patient will be residing, and identified as the person(s) who will aid the patient in the event of a mental health crisis (suicidal ideations/suicide attempt).  With written consent from the patient, the family member/significant other has been provided the following suicide prevention education, prior to the and/or following the discharge of the patient.  Per LG, the [facility] terminated patient ability to stay there after 60 days from the date of patient running away. LG reports that she is able to return there when discharged from [facility] however after the 60 days, she will be placed somewhere else. Pt has a care coordinator through [employer], Ranesha [phone] who will help coordinate with discharge. LG reports that pt ran away due to not being able to eat the food she wanted to eat (pt is on a gluten free diet).   LG reports that Ranesha and herself will coordinate new placement for pt, pt has [employer] and does not foresee issues arising with finding another living environment for her when the 60 days is up.  The suicide prevention education provided includes the following: Suicide risk factors Suicide prevention and interventions National Suicide Hotline telephone number [facility] assessment telephone number [facility] Emergency Assistance 911 [facility] and/or Residential Mobile Crisis Unit telephone number  Request made of family/significant other to: Remove weapons (e.g., guns, rifles, knives), all items previously/currently identified as safety concern.   Remove drugs/medications (over-the-counter, prescriptions, illicit drugs), all items previously/currently identified as a safety  concern.  The family member/significant other verbalizes understanding of the suicide prevention education information provided.  The family member/significant other agrees to remove the items of safety concern listed above.  Jenkins LULLA Primer 05/09/2023, 1:59 PM

## 2023-05-09 NOTE — [facility] Group Notes (Signed)
 [facility] Group Notes:  (Nursing/MHT/Case Management/Adjunct)  Date:  05/09/2023  Time:  9:18 PM  Type of Therapy:   Wrap-up group  Participation Level:  Active  Participation Quality:  Appropriate  Affect:  Appropriate  Cognitive:  Appropriate  Insight:  Appropriate  Engagement in Group:  Engaged  Modes of Intervention:  Education  Summary of Progress/Problems: Goal  to stay positive. Rated day 4/10.  Holly [name] 05/09/2023, 9:18 PM

## 2023-05-09 NOTE — Progress Notes (Signed)
   05/09/23 2108  Psych Admission Type (Psych Patients Only)  Admission Status Voluntary  Psychosocial Assessment  Patient Complaints Anxiety;Depression  Eye Contact Fair  Facial Expression Anxious  Affect Anxious  Speech Logical/coherent  Interaction Assertive  Motor Activity Restless  Appearance/Hygiene Disheveled  Behavior Characteristics Cooperative;Appropriate to situation  Mood Depressed;Anxious  Thought Process  Coherency WDL  Content WDL  Delusions None reported or observed  Perception WDL  Hallucination None reported or observed  Judgment Poor  Confusion None  Danger to Self  Current suicidal ideation? Denies  Self-Injurious Behavior No self-injurious ideation or behavior indicators observed or expressed   Agreement Not to Harm Self Yes  Description of Agreement verbal  Danger to Others  Danger to Others None reported or observed

## 2023-05-09 NOTE — Plan of Care (Signed)
  Problem: Education: Goal: Knowledge of [place] [facility] Education information/materials will improve Outcome: Progressing Goal: Emotional status will improve Outcome: Progressing Goal: Mental status will improve Outcome: Progressing Goal: Verbalization of understanding the information provided will improve Outcome: Progressing   Problem: Activity: Goal: Interest or engagement in activities will improve Outcome: Progressing Goal: Sleeping patterns will improve Outcome: Progressing   Problem: Coping: Goal: Ability to verbalize frustrations and anger appropriately will improve Outcome: Progressing Goal: Ability to demonstrate self-control will improve Outcome: Progressing   Problem: Health Behavior/Discharge Planning: Goal: Identification of resources available to assist in meeting health care needs will improve Outcome: Progressing Goal: Compliance with treatment plan for underlying cause of condition will improve Outcome: Progressing   Problem: Physical Regulation: Goal: Ability to maintain clinical measurements within normal limits will improve Outcome: Progressing   Problem: Safety: Goal: Periods of time without injury will increase Outcome: Progressing   Problem: Education: Goal: Utilization of techniques to improve thought processes will improve Outcome: Progressing Goal: Knowledge of the prescribed therapeutic regimen will improve Outcome: Progressing   Problem: Activity: Goal: Interest or engagement in leisure activities will improve Outcome: Progressing Goal: Imbalance in normal sleep/wake cycle will improve Outcome: Progressing   Problem: Coping: Goal: Coping ability will improve Outcome: Progressing Goal: Will verbalize feelings Outcome: Progressing   Problem: Health Behavior/Discharge Planning: Goal: Ability to make decisions will improve Outcome: Progressing Goal: Compliance with therapeutic regimen will improve Outcome: Progressing    Problem: Role Relationship: Goal: Will demonstrate positive changes in social behaviors and relationships Outcome: Progressing   Problem: Safety: Goal: Ability to disclose and discuss suicidal ideas will improve Outcome: Progressing Goal: Ability to identify and utilize support systems that promote safety will improve Outcome: Progressing   Problem: Self-Concept: Goal: Will verbalize positive feelings about self Outcome: Progressing Goal: Level of anxiety will decrease Outcome: Progressing   Problem: Education: Goal: Ability to make informed decisions regarding treatment will improve Outcome: Progressing   Problem: Coping: Goal: Coping ability will improve Outcome: Progressing   Problem: Health Behavior/Discharge Planning: Goal: Identification of resources available to assist in meeting health care needs will improve Outcome: Progressing   Problem: Medication: Goal: Compliance with prescribed medication regimen will improve Outcome: Progressing   Problem: Self-Concept: Goal: Ability to disclose and discuss suicidal ideas will improve Outcome: Progressing   Problem: Education: Goal: Ability to state activities that reduce stress will improve Outcome: Progressing   Problem: Coping: Goal: Ability to identify and develop effective coping behavior will improve Outcome: Progressing   Problem: Self-Concept: Goal: Ability to identify factors that promote anxiety will improve Outcome: Progressing Goal: Level of anxiety will decrease Outcome: Progressing Goal: Ability to modify response to factors that promote anxiety will improve Outcome: Progressing

## 2023-05-09 NOTE — Progress Notes (Signed)
   05/09/23 0800  Psych Admission Type (Psych Patients Only)  Admission Status Voluntary  Psychosocial Assessment  Patient Complaints Anxiety;Depression  Eye Contact Fair  Facial Expression Anxious  Affect Anxious  Speech Logical/coherent  Interaction Assertive  Motor Activity Restless  Appearance/Hygiene Disheveled  Behavior Characteristics Cooperative;Appropriate to situation  Mood Depressed;Anxious  Thought Process  Coherency WDL  Content WDL  Delusions None reported or observed  Perception WDL  Hallucination None reported or observed  Judgment Poor  Confusion None  Danger to Self  Current suicidal ideation? Denies  Self-Injurious Behavior No self-injurious ideation or behavior indicators observed or expressed   Agreement Not to Harm Self Yes  Description of Agreement Verbal

## 2023-05-10 DIAGNOSIS — F250 Schizoaffective disorder, bipolar type: Secondary | ICD-10-CM | POA: Diagnosis not present

## 2023-05-10 LAB — GLUCOSE, CAPILLARY
Glucose-Capillary: 91 mg/dL (ref 70–99)
Glucose-Capillary: 126 mg/dL — ABNORMAL HIGH (ref 70–99)

## 2023-05-10 LAB — LITHIUM LEVEL: Lithium Lvl: 0.89 mmol/L (ref 0.60–1.20)

## 2023-05-10 NOTE — Group Note (Signed)
 Recreation Therapy Group Note   Group Topic:General Recreation  Group Date: 05/10/2023 Start Time: 0930 End Time: 1005 Facilitators: Holly [name], LRT,CTRS Location: [address]   Group Topic/Focus: General Recreation   Goal Area(s) Addresses:  Patient will use appropriate interactions with peers.   Patient will choose appropriate songs.  Intervention: Research scientist (medical), Television  Group Description: Karaoke. Patients took turns selecting songs that were appropriate to perform for their enjoyment with peers.      Affect/Mood: N/A   Participation Level: Did not attend    Clinical Observations/Individualized Feedback:      Plan: Continue to engage patient in RT group sessions 2-3x/week.   Holly [name], LRT,CTRS 05/10/2023 12:57 PM

## 2023-05-10 NOTE — Progress Notes (Signed)
 Care Coordination: Legal Guardian Concerns   CSW spoke with patient regarding concern with Legal guardian. Pt reported that she want to have a new guardian assigned. Due to her lack of concern and not being attentive to her role and ensuring that appointments are scheduled and kept. Pt also reported concern for her belongings left and unsure of whereabouts since she was informed that she can not return to the home.   CSW informed pt of the following options to address concern:  -contact guardian supervisor to have a new guardian assigned -File DSS APS report if circumstances permit -File a motion with the court to amend guardianship all together and have her rights restored.  Pt chose to have her guardian switched.  CSW attempted to reach Guardian Specialist CSW left VM for supervisor for immediate resolution.    CSW attempted to make contact w/ legal guardian, Holly [name], [phone] regarding housing concern and personal belongings. CSW will reach out again tomorrow.  Holly [name] LCSWA 1.1.25 @1700 

## 2023-05-10 NOTE — Progress Notes (Signed)
 Patient rated her depression level 3/10 and her anxiety level 3/10 with 10 being the highest and 0 none. Medication and group compliant. Pt observed interacting well with peers. Appetite good on shift. Safety maintained.  05/10/23 0920  Psych Admission Type (Psych Patients Only)  Admission Status Voluntary  Psychosocial Assessment  Patient Complaints Anxiety;Depression  Eye Contact Fair  Facial Expression Anxious  Affect Anxious  Speech Logical/coherent  Interaction Assertive  Motor Activity Restless  Appearance/Hygiene Disheveled  Behavior Characteristics Cooperative;Appropriate to situation  Mood Depressed;Anxious  Thought Process  Coherency WDL  Content WDL  Delusions None reported or observed  Perception WDL  Hallucination None reported or observed  Judgment Poor  Confusion None  Danger to Self  Current suicidal ideation? Denies  Self-Injurious Behavior No self-injurious ideation or behavior indicators observed or expressed   Agreement Not to Harm Self Yes  Description of Agreement Verbal  Danger to Others  Danger to Others None reported or observed

## 2023-05-10 NOTE — Progress Notes (Signed)
 [facility] MD Progress Note  05/10/2023 10:20 AM ALYZE LAUF  MRN:  [id number] Subjective:    Holly [name] is a 28 y.o., female with a past psychiatric history significant for schizoaffective disorder and mild intellectual disability who presents to the [facility] from behavioral health urgent care for evaluation and management of worsening depression and SI with a plan to cut her wrist to kill herself.  According to outside records, the patient presented there with worsening depression, SI with a plan to cut her wrist secondary to stress related to living situation at ALF.  On assessment today, the patient reports that her mood is stable.  Reports feeling some depression and sadness due to not having a place to be discharged to.  Discussed with social work, at this time we will guardian does not have a place for the patient to live at discharge.  Patient states that she understands she does not have any problems staying in the hospital until she has a place to live.  Reports that anxiety is at a lower level and mostly surrounding disposition planning.  Reports that sleep is okay.  Reports appetite is okay.  Concentration is okay.  Denies any SI or HI.  Denies any psychotic symptoms including hallucinations and paranoia.  We discussed that at her lithium  level was within therapeutic range.    Denies any side effects to lithium  or other scheduled  psychiatric medications.     Principal Problem: Schizoaffective disorder, bipolar type (HCC) Diagnosis: Principal Problem:   Schizoaffective disorder, bipolar type (HCC) Active Problems:   Mild intellectual disability  Total Time spent with patient: 20 minutes  Past Psychiatric History:  Current Psychiatrist: Idell Remington Current Therapist: no therapist for 1.5 years Previous Psychiatric Diagnoses: schizoaffective disorder bipolar type, intellectual disability, ADHD, PTSD Current psychiatric medications:            Tylenol  PRN,  celexa  20, B12 1000 daily, synthroid  100 daily, lithium  1200 at bedtime, lybalvi  10 at bedtime, metformin  [phone], trazodone  150 at bedtime  Psychiatric medication history/compliance: -prozac  20, celexa  20, amantadine  100 BID, abilify  10, lithium  900 at bedtime, trazodone  300, cogentin  1, doxepin  25, zyprexa  15 [facility] 08/2015: Schizoaffective disorder bipolar type  Meds:  prozac  20, lithium  450 CR BID, zyprexa  15  Psychiatric Hospitalization hx: 8 Prior Psychiatric Hospitalizations (2000, 2007x2, 2008, 2015, 2016, 2017x2, 03/2023) Psychotherapy hx: history of seeing therapist Neuromodulation history: none History of suicide (obtained from HPI): multiple suicide attempts, most recently 2017 after ingesting 6-8 AA batteries as a suicidal gesture History of homicide or aggression (obtained in HPI): none   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   Obesity    PTSD (post-traumatic stress disorder)     Past Surgical History:  Procedure Laterality Date   IR ANGIOGRAM PULMONARY BILATERAL SELECTIVE  09/23/2022   IR THROMBECT PRIM MECH INIT (INCLU) MOD SED  09/23/2022   IR US  GUIDE VASC ACCESS RIGHT  09/23/2022   NO PAST SURGERIES     None     Family History:  Family History  Adopted: Yes   Family Psychiatric  History: See H&P  Social History:  Social History  Substance and Sexual Activity  Alcohol Use No     Social History   Substance and Sexual Activity  Drug Use No    Social History   Socioeconomic History   Marital status: Single    Spouse name: Not on file   Number of children: Not on file   Years of education: Not on file   Highest education level: Not on file  Occupational History   Not on file  Tobacco Use    Smoking status: Never   Smokeless tobacco: Never  Vaping Use   Vaping status: Never Used  Substance and Sexual Activity   Alcohol use: No   Drug use: No   Sexual activity: Never    Birth control/protection: Injection  Other Topics Concern   Not on file  Social History Narrative   ** Merged History Encounter **       Social Drivers of Health   Financial Resource Strain: Not on file  Food Insecurity: Food Insecurity Present (05/05/2023)   Hunger Vital Sign    Worried About Running Out of Food in the Last Year: Sometimes true    Ran Out of Food in the Last Year: Sometimes true  Transportation Needs: Unmet Transportation Needs (05/05/2023)   PRAPARE - Administrator, Civil Service (Medical): Yes    Lack of Transportation (Non-Medical): Yes  Physical Activity: Not on file  Stress: Not on file  Social Connections: Not on file   Additional Social History:                           Current Medications: Current Facility-Administered Medications  Medication Dose Route Frequency Provider Last Rate Last Admin   acetaminophen  (TYLENOL ) tablet 650 mg  650 mg Oral Q6H PRN Blair, Christal H, NP   650 mg at 05/10/23 0015   alum & mag hydroxide-simeth (MAALOX/MYLANTA) 200-200-20 MG/5ML suspension 30 mL  30 mL Oral Q4H PRN Bennett, Christal H, NP   30 mL at 05/08/23 2249   citalopram  (CELEXA ) tablet 20 mg  20 mg Oral Daily Attiah, Nadir, MD   20 mg at 05/10/23 0757   cyanocobalamin  (VITAMIN B12) tablet 1,000 mcg  1,000 mcg Oral Daily Evelena, Nadir, MD   1,000 mcg at 05/10/23 0757   hydrOXYzine  (ATARAX ) tablet 25 mg  25 mg Oral TID PRN Bennett, Christal H, NP   25 mg at 05/09/23 2109   levothyroxine  (SYNTHROID ) tablet 100 mcg  100 mcg Oral Q0600 Landy Veva CROME, RPH   100 mcg at 05/10/23 9390   lithium  carbonate capsule 1,200 mg  1,200 mg Oral QHS Attiah, Nadir, MD   1,200 mg at 05/09/23 2108   magnesium  hydroxide (MILK OF MAGNESIA) suspension 30 mL  30 mL Oral Daily  PRN Bennett, Christal H, NP       metFORMIN  (GLUCOPHAGE ) tablet 1,000 mg  1,000 mg Oral QHS Bennett, Christal H, NP   1,000 mg at 05/09/23 2108   metFORMIN  (GLUCOPHAGE ) tablet 500 mg  500 mg Oral Q breakfast Bennett, Christal H, NP   500 mg at 05/10/23 0757   neomycin -bacitracin -polymyxin (NEOSPORIN) ointment   Topical PRN Chien, Stephanie, MD       OLANZapine  (ZYPREXA ) injection 10 mg  10 mg Intramuscular TID PRN Joylynn Defrancesco, Rankin, MD       OLANZapine  (ZYPREXA ) injection 5 mg  5 mg Intramuscular TID PRN Ronniesha Seibold, Rankin, MD       OLANZapine  (ZYPREXA ) tablet 10 mg  10  mg Oral QHS Evelena, Nadir, MD   10 mg at 05/09/23 2109   OLANZapine  zydis (ZYPREXA ) disintegrating tablet 5 mg  5 mg Oral TID PRN Tyresha Fede, MD       ondansetron  (ZOFRAN -ODT) disintegrating tablet 4 mg  4 mg Oral Once PRN Onuoha, Chinwendu V, NP       pantoprazole  (PROTONIX ) EC tablet 40 mg  40 mg Oral Daily Bennett, Christal H, NP   40 mg at 05/10/23 0757   traZODone  (DESYREL ) tablet 150 mg  150 mg Oral QHS Attiah, Nadir, MD   150 mg at 05/09/23 2108    Lab Results:  Results for orders placed or performed during the hospital encounter of 05/05/23 (from the past 48 hours)  Glucose, capillary     Status: None   Collection Time: 05/09/23  5:50 AM  Result Value Ref Range   Glucose-Capillary 92 70 - 99 mg/dL    Comment: Glucose reference range applies only to samples taken after fasting for at least 8 hours.  Glucose, capillary     Status: Abnormal   Collection Time: 05/09/23  1:00 PM  Result Value Ref Range   Glucose-Capillary 124 (H) 70 - 99 mg/dL    Comment: Glucose reference range applies only to samples taken after fasting for at least 8 hours.  Glucose, capillary     Status: None   Collection Time: 05/10/23  6:06 AM  Result Value Ref Range   Glucose-Capillary 91 70 - 99 mg/dL    Comment: Glucose reference range applies only to samples taken after fasting for at least 8 hours.   Comment 1 Notify RN    Comment  2 Document in Chart   Lithium  level     Status: None   Collection Time: 05/10/23  6:21 AM  Result Value Ref Range   Lithium  Lvl 0.89 0.60 - 1.20 mmol/L    Comment: Performed at [facility], [address] [place], [place] [address]    Blood Alcohol level:  Lab Results  Component Value Date   ETH <10 05/04/2023   ETH <10 03/23/2023    Metabolic Disorder Labs: Lab Results  Component Value Date   HGBA1C 5.0 03/25/2023   MPG 96.8 03/25/2023   MPG 102.54 09/23/2022   Lab Results  Component Value Date   PROLACTIN 48.3 (H) 08/21/2015   Lab Results  Component Value Date   CHOL 122 03/25/2023   TRIG 112 03/25/2023   HDL 47 03/25/2023   CHOLHDL 2.6 03/25/2023   VLDL 22 03/25/2023   LDLCALC 53 03/25/2023   LDLCALC 44 08/21/2015    Physical Findings: AIMS: Facial and Oral Movements Muscles of Facial Expression: None Lips and Perioral Area: None Jaw: None Tongue: None,Extremity Movements Upper (arms, wrists, hands, fingers): None Lower (legs, knees, ankles, toes): None, Trunk Movements Neck, shoulders, hips: None, Global Judgements Severity of abnormal movements overall : None Incapacitation due to abnormal movements: None Patient's awareness of abnormal movements: No Awareness, Dental Status Current problems with teeth and/or dentures?: No Does patient usually wear dentures?: No Edentia?: No  CIWA:    COWS:     Musculoskeletal: Strength & Muscle Tone: within normal limits Gait & Station: normal Patient leans: N/A  Psychiatric Specialty Exam:  Presentation  General Appearance:  Disheveled  Eye Contact: Fair  Speech: Normal Rate  Speech Volume: Decreased  Handedness: Right   Mood and Affect  Mood: Less anxious; mild depression  Affect: Depressed; Constricted   Thought Process  Thought Processes:  Linear  Descriptions of Associations:Intact  Orientation:Full (Time, Place and Person)  Thought Content:Logical  History  of Schizophrenia/Schizoaffective disorder:Yes  Duration of Psychotic Symptoms:N/A  Hallucinations:Hallucinations: None  Ideas of Reference:None  Suicidal Thoughts:Suicidal Thoughts: No  Homicidal Thoughts:Homicidal Thoughts: No   Sensorium  Memory: Immediate Good; Recent Good; Remote Good  Judgment: Fair  Insight: Fair   Chartered Certified Accountant: Fair  Attention Span: Fair  Recall: Fair  Fund of Knowledge: Fair  Language: Good   Psychomotor Activity  Psychomotor Activity: Psychomotor Activity: Normal   Assets  Assets: Desire for Improvement; Resilience   Sleep  Sleep: Sleep: Fair    Physical Exam: Physical Exam Vitals reviewed.  Constitutional:      Appearance: She is normal weight.  Pulmonary:     Effort: Pulmonary effort is normal.  Neurological:     Mental Status: She is alert.     Motor: No weakness.     Gait: Gait normal.  Psychiatric:        Behavior: Behavior normal.    Review of Systems  Constitutional:  Negative for chills and fever.  Cardiovascular:  Negative for chest pain and palpitations.  Neurological:  Negative for dizziness, tingling, tremors and headaches.  Psychiatric/Behavioral:  Positive for depression. Negative for hallucinations, memory loss, substance abuse and suicidal ideas. The patient is nervous/anxious. The patient does not have insomnia.   All other systems reviewed and are negative.  Blood pressure 124/87, pulse 81, temperature 98.1 F (36.7 C), temperature source Oral, resp. rate 20, height 5' 4 (1.626 m), weight 104.8 kg, SpO2 100%. Body mass index is 39.65 kg/m.   Treatment Plan Summary: Daily contact with patient to assess and evaluate symptoms and progress in treatment and Medication management   Diagnoses / Active Problems: Principal Problem:   Schizoaffective disorder, bipolar type  Active Problems:   Mild intellectual disability-per admission H&P     PLAN: Safety and  Monitoring:             -- VOLUNTARY admission to inpatient psychiatric unit for safety, stabilization and treatment             -- Daily contact with patient to assess and evaluate symptoms and progress in treatment             -- Patient's case to be discussed in multi-disciplinary team meeting             -- Observation Level : q15 minute checks             -- Vital signs:  q12 hours             -- Precautions: suicide, elopement, and assault   2. Psychiatric Diagnoses and Treatment:              --Continue  home Lithium  carbonate 1200 at bedtime for mood stabilization                         12/27 Li level 0.11                         01/01/205 Li level 0.89 wnl              --Continue home celexa  20mg  daily for depression             --Changed lybalvi  to zyprexa  10mg  at bedtime as not on formulary while in hospital             --  Continue trazodone  150 at bedtime for sleep    -- The risks/benefits/side-effects/alternatives to this medication were discussed in detail with the patient and time was given for questions. The patient consents to medication trial.              -- Metabolic profile and EKG monitoring obtained while on an atypical antipsychotic  BMI: 39.65 TSH:  Recent Labs       Lab Results  Component Value Date    TSH 2.091 05/04/2023      Lipid Panel:  Recent Labs       Lab Results  Component Value Date    CHOL 122 03/25/2023    HDL 47 03/25/2023    LDLCALC 53 03/25/2023    TRIG 112 03/25/2023    CHOLHDL 2.6 03/25/2023      HbgA1c:  Recent Labs       Lab Results  Component Value Date    HGBA1C 5.0 03/25/2023      QTc: 457 12/26             -- Encouraged patient to participate in unit milieu and in scheduled group therapies              Other PRNS:               -- start acetaminophen  650 mg every 6 hours as needed for mild to moderate pain, fever, and headaches              -- start hydroxyzine  25 mg three times a day as needed for anxiety               -- start aluminum-magnesium  hydroxide + simethicone  30 mL every 4 hours as needed for heartburn             -- As needed agitation protocol in-place              3. Medical Issues Being Addressed:  --Continue home B12 for B12 deficiency --Continue home levothyroxine  for hypothyroidism --Continue home metformin  for diabetes  --Continue pantoprazole  40mg  for GERD --At previous admission, had home medication of eliquis  for history of PE. Per hospital discharge summary from 09/2022, eliquis  was meant to be a 6 month course. Eliquis  was not on current home medications.  -- Continue neosporin for tear between her toes.    4. Discharge Planning:              -- Social work and case management to assist with discharge planning and identification of hospital follow-up needs prior to discharge             -- Estimated LOS: 2-3 more days -disposition is pending placement -social work to coordinate with legal guardian             -- Discharge Concerns: Need to establish a safety plan; Medication compliance and effectiveness             -- Discharge Goals: Return home with outpatient referrals for mental health follow-up including medication management/psychotherapy     Rankin Montes, MD 05/10/2023, 10:20 AM  Total Time Spent in Direct Patient Care:  I personally spent 35 minutes on the unit in direct patient care. The direct patient care time included face-to-face time with the patient, reviewing the patient's chart, communicating with other professionals, and coordinating care. Greater than 50% of this time was spent in counseling or coordinating care with the patient regarding goals of hospitalization, psycho-education, and discharge planning needs.  Rankin Montes, MD Psychiatrist

## 2023-05-10 NOTE — [facility] Group Notes (Signed)
 [facility] Group Notes:  (Nursing/MHT/Case Management/Adjunct)  Date:  05/10/2023  Time:  8:34 PM  Type of Therapy:   NA Group  Participation Level:  Active  Participation Quality:  Appropriate  Affect:  Appropriate  Cognitive:  Appropriate  Insight:  Appropriate  Engagement in Group:  Engaged  Modes of Intervention:  Discussion and Support  Summary of Progress/Problems: Pt participated in Narcotics Anonymous group. Zell Hylton 05/10/2023, 8:34 PM

## 2023-05-10 NOTE — Plan of Care (Signed)
  Problem: Education: Goal: Emotional status will improve Outcome: Progressing   Problem: Activity: Goal: Interest or engagement in activities will improve Outcome: Progressing Goal: Sleeping patterns will improve Outcome: Progressing   Problem: Coping: Goal: Ability to verbalize frustrations and anger appropriately will improve Outcome: Progressing Goal: Ability to demonstrate self-control will improve Outcome: Progressing   Problem: Safety: Goal: Periods of time without injury will increase Outcome: Progressing

## 2023-05-10 NOTE — Group Note (Signed)
 Date:  05/10/2023 Time:  12:02 PM  Group Topic/Focus:  Wellness Toolbox:   The focus of this group is to discuss various aspects of wellness, balancing those aspects and exploring ways to increase the ability to experience wellness.  Patients will create a wellness toolbox for use upon discharge.    Participation Level:  Active  Participation Quality:  Appropriate  Affect:  Appropriate  Cognitive:  Appropriate  Insight: Appropriate  Engagement in Group:  Engaged  Modes of Intervention:  Education  Additional Comments:     Holly [name] 05/10/2023, 12:02 PM

## 2023-05-11 DIAGNOSIS — F250 Schizoaffective disorder, bipolar type: Secondary | ICD-10-CM | POA: Diagnosis not present

## 2023-05-11 LAB — GLUCOSE, CAPILLARY
Glucose-Capillary: 95 mg/dL (ref 70–99)
Glucose-Capillary: 110 mg/dL — ABNORMAL HIGH (ref 70–99)

## 2023-05-11 MED ORDER — BACITRACIN-NEOMYCIN-POLYMYXIN OINTMENT TUBE: 1.0000 | TOPICAL_OINTMENT | CUTANEOUS | Status: AC | PRN

## 2023-05-11 MED ORDER — METFORMIN HCL 1000 MG PO TABS: 1000.0000 mg | ORAL_TABLET | Freq: Every day | ORAL | 0 refills | Status: AC

## 2023-05-11 MED ORDER — OLANZAPINE 10 MG IM SOLR: 10.0000 mg | Freq: Three times a day (TID) | INTRAMUSCULAR | Status: DC | PRN

## 2023-05-11 MED ORDER — LITHIUM CARBONATE 600 MG PO CAPS: 1200.0000 mg | ORAL_CAPSULE | Freq: Every day | ORAL | 0 refills | Status: AC

## 2023-05-11 MED ORDER — LINACLOTIDE 290 MCG PO CAPS: 290.0000 ug | ORAL_CAPSULE | Freq: Every day | ORAL | 0 refills | Status: AC

## 2023-05-11 MED ORDER — LEVOTHYROXINE SODIUM 100 MCG PO TABS: 100.0000 ug | ORAL_TABLET | Freq: Every day | ORAL | 0 refills | Status: AC

## 2023-05-11 MED ORDER — METFORMIN HCL 500 MG PO TABS: 500.0000 mg | ORAL_TABLET | Freq: Every day | ORAL | 0 refills | Status: AC

## 2023-05-11 MED ORDER — OMEPRAZOLE 20 MG PO CPDR: 20.0000 mg | DELAYED_RELEASE_CAPSULE | Freq: Every day | ORAL | 0 refills | Status: AC

## 2023-05-11 MED ORDER — OLANZAPINE 10 MG IM SOLR: 5.0000 mg | Freq: Three times a day (TID) | INTRAMUSCULAR | Status: DC | PRN

## 2023-05-11 MED ORDER — TRAZODONE HCL 150 MG PO TABS: 150.0000 mg | ORAL_TABLET | Freq: Every day | ORAL | 0 refills | Status: AC

## 2023-05-11 MED ORDER — CITALOPRAM HYDROBROMIDE 20 MG PO TABS: 20.0000 mg | ORAL_TABLET | Freq: Every day | ORAL | 0 refills | Status: AC

## 2023-05-11 MED ORDER — OLANZAPINE 5 MG PO TBDP
5.0000 mg | ORAL_TABLET | Freq: Three times a day (TID) | ORAL | Status: DC | PRN
Start: 2023-05-11 — End: 2023-05-12

## 2023-05-11 MED ORDER — LYBALVI 10-10 MG PO TABS: 1.0000 | ORAL_TABLET | Freq: Every day | ORAL | 0 refills | Status: AC

## 2023-05-11 MED ORDER — HYDROXYZINE HCL 25 MG PO TABS: 25.0000 mg | ORAL_TABLET | Freq: Three times a day (TID) | ORAL | 0 refills | Status: AC | PRN

## 2023-05-11 NOTE — Progress Notes (Addendum)
  CSW provided resources/ continuity of care / concerns:   CSW provided print out of group homes in [place] to patient as requested. Pt informed that her legal guardian and care coordinators makes the final decision as to where she will be placed. Pt reported that she is fine with discharging to current home with the anticipation of relocating to new group home. CSW also provided [facility] supervisor contact info to follow-up regarding a change of legal guardian. CSW have made several attempts to contact  Guardian Specialist supervisor Franky Jefferson [phone] and left VM/ contact information has been provided to the patient to follow-up.   Alpheus Stiff Johhnson LCSWA  05/10/22 @1044

## 2023-05-11 NOTE — Group Note (Signed)
 LCSW Group Therapy Note   Group Date: 05/11/2023 Start Time: 1100 End Time: 1200 Type of Therapy and Topic:  Group Therapy - Who Am I?  Participation Level:  DID NOT ATTEND  Description of Group The focus of this group was to aid patients in self-exploration and awareness. Patients were guided in exploring various factors of oneself to include interests, readiness to change, management of emotions, and individual perception of self. Patients were provided with complementary worksheets exploring hidden talents, ease of asking other for help, music/media preferences, understanding and responding to feelings/emotions, and hope for the future. At group closing, patients were encouraged to adhere to discharge plan to assist in continued self-exploration and understanding.  Therapeutic Goals Patients learned that self-exploration and awareness is an ongoing process Patients identified their individual skills, preferences, and abilities Patients explored their openness to establish and confide in supports Patients explored their readiness for change and progression of mental health     Therapeutic Modalities Cognitive Behavioral Therapy Motivational Interviewing  Holly [name] Holly [name] 05/12/2023  7:10 PM

## 2023-05-11 NOTE — Progress Notes (Signed)
 [facility] MD Progress Note  05/11/2023 12:21 PM Holly [name]  MRN:  [id number] Subjective:    Holly [name] is a 28 y.o., female with a past psychiatric history significant for schizoaffective disorder and mild intellectual disability who presents to the [facility] from behavioral health urgent care for evaluation and management of worsening depression and SI with a plan to cut her wrist to kill herself.  According to outside records, the patient presented there with worsening depression, SI with a plan to cut her wrist secondary to stress related to living situation at ALF.  On assessment today, the patient reports that her mood is stable with some anxiety about having to return to the group home, but I have to even though I do not want to is what patient said about this. Patient denied having urges to cut and endorsed that she is trying better cope with her situation than before. Patient endorsed that she is happy to hear that she will be getting a new group home soon. Patient endorses that although she is anxious about a new group home she finds this to be some improvement on her situation and concerns. Patient endorsed that she knows how to get help before she tries to harm herself in the future if she starts to feel overwhelmed. Patient endorses a good appetite and sleep. She has enjoyed spending time on the mileu. Patient denies SI, HI, and AVH.     Principal Problem: Schizoaffective disorder, bipolar type (HCC) Diagnosis: Principal Problem:   Schizoaffective disorder, bipolar type (HCC) Active Problems:   Mild intellectual disability  Total Time spent with patient: 20 minutes  Past Psychiatric History:  Current Psychiatrist: Idell Remington Current Therapist: no therapist for 1.5 years Previous Psychiatric Diagnoses: schizoaffective disorder bipolar type, intellectual disability, ADHD, PTSD Current psychiatric medications:            Tylenol  PRN, celexa  20, B12 1000  daily, synthroid  100 daily, lithium  1200 at bedtime, lybalvi  10 at bedtime, metformin  [id number], trazodone  150 at bedtime  Psychiatric medication history/compliance: -prozac  20, celexa  20, amantadine  100 BID, abilify  10, lithium  900 at bedtime, trazodone  300, cogentin  1, doxepin  25, zyprexa  15 [facility] 08/2015: Schizoaffective disorder bipolar type  Meds:  prozac  20, lithium  450 CR BID, zyprexa  15  Psychiatric Hospitalization hx: 8 Prior Psychiatric Hospitalizations (2000, 2007x2, 2008, 2015, 2016, 2017x2, 03/2023) Psychotherapy hx: history of seeing therapist Neuromodulation history: none History of suicide (obtained from HPI): multiple suicide attempts, most recently 2017 after ingesting 6-8 AA batteries as a suicidal gesture History of homicide or aggression (obtained in HPI): none   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   Obesity    PTSD (post-traumatic stress disorder)     Past Surgical History:  Procedure Laterality Date   IR ANGIOGRAM PULMONARY BILATERAL SELECTIVE  09/23/2022   IR THROMBECT PRIM MECH INIT (INCLU) MOD SED  09/23/2022   IR US  GUIDE VASC ACCESS RIGHT  09/23/2022   NO PAST SURGERIES     None     Family History:  Family History  Adopted: Yes   Family Psychiatric  History: See H&P  Social History:  Social History  Substance and Sexual Activity  Alcohol Use No     Social History   Substance and Sexual Activity  Drug Use No    Social History   Socioeconomic History   Marital status: Single    Spouse name: Not on file   Number of children: Not on file   Years of education: Not on file   Highest education level: Not on file  Occupational History   Not on file  Tobacco Use   Smoking status:  Never   Smokeless tobacco: Never  Vaping Use   Vaping status: Never Used  Substance and Sexual Activity   Alcohol use: No   Drug use: No   Sexual activity: Never    Birth control/protection: Injection  Other Topics Concern   Not on file  Social History Narrative   ** Merged History Encounter **       Social Drivers of Health   Financial Resource Strain: Not on file  Food Insecurity: Food Insecurity Present (05/05/2023)   Hunger Vital Sign    Worried About Running Out of Food in the Last Year: Sometimes true    Ran Out of Food in the Last Year: Sometimes true  Transportation Needs: Unmet Transportation Needs (05/05/2023)   PRAPARE - Administrator, Civil Service (Medical): Yes    Lack of Transportation (Non-Medical): Yes  Physical Activity: Not on file  Stress: Not on file  Social Connections: Not on file   Additional Social History:                           Current Medications: Current Facility-Administered Medications  Medication Dose Route Frequency Provider Last Rate Last Admin   acetaminophen  (TYLENOL ) tablet 650 mg  650 mg Oral Q6H PRN Bennett, Christal H, NP   650 mg at 05/11/23 1150   alum & mag hydroxide-simeth (MAALOX/MYLANTA) 200-200-20 MG/5ML suspension 30 mL  30 mL Oral Q4H PRN Bennett, Christal H, NP   30 mL at 05/08/23 2249   citalopram  (CELEXA ) tablet 20 mg  20 mg Oral Daily Attiah, Nadir, MD   20 mg at 05/11/23 0753   cyanocobalamin  (VITAMIN B12) tablet 1,000 mcg  1,000 mcg Oral Daily Evelena, Nadir, MD   1,000 mcg at 05/11/23 0751   hydrOXYzine  (ATARAX ) tablet 25 mg  25 mg Oral TID PRN Bennett, Christal H, NP   25 mg at 05/09/23 2109   levothyroxine  (SYNTHROID ) tablet 100 mcg  100 mcg Oral Q0600 Landy Veva CROME, RPH   100 mcg at 05/11/23 9352   lithium  carbonate capsule 1,200 mg  1,200 mg Oral QHS Attiah, Nadir, MD   1,200 mg at 05/10/23 2115   magnesium  hydroxide (MILK OF MAGNESIA) suspension 30 mL  30 mL Oral Daily PRN Bennett,  Christal H, NP       metFORMIN  (GLUCOPHAGE ) tablet 1,000 mg  1,000 mg Oral QHS Bennett, Christal H, NP   1,000 mg at 05/10/23 2115   metFORMIN  (GLUCOPHAGE ) tablet 500 mg  500 mg Oral Q breakfast Bennett, Christal H, NP   500 mg at 05/11/23 9248   neomycin -bacitracin -polymyxin (NEOSPORIN) ointment   Topical PRN Chien, Stephanie, MD       OLANZapine  (ZYPREXA ) injection 10 mg  10 mg Intramuscular TID PRN Massengill, Rankin, MD       OLANZapine  (ZYPREXA ) injection 5 mg  5 mg Intramuscular TID PRN Massengill, Rankin, MD       OLANZapine  (ZYPREXA ) tablet 10 mg  10  mg Oral QHS Evelena, Nadir, MD   10 mg at 05/10/23 2115   OLANZapine  zydis (ZYPREXA ) disintegrating tablet 5 mg  5 mg Oral TID PRN Johny Lot, MD       ondansetron  (ZOFRAN -ODT) disintegrating tablet 4 mg  4 mg Oral Once PRN Onuoha, Chinwendu V, NP       pantoprazole  (PROTONIX ) EC tablet 40 mg  40 mg Oral Daily Bennett, Christal H, NP   40 mg at 05/11/23 0751   traZODone  (DESYREL ) tablet 150 mg  150 mg Oral QHS Evelena, Nadir, MD   150 mg at 05/10/23 2115    Lab Results:  Results for orders placed or performed during the hospital encounter of 05/05/23 (from the past 48 hours)  Glucose, capillary     Status: Abnormal   Collection Time: 05/09/23  1:00 PM  Result Value Ref Range   Glucose-Capillary 124 (H) 70 - 99 mg/dL    Comment: Glucose reference range applies only to samples taken after fasting for at least 8 hours.  Glucose, capillary     Status: None   Collection Time: 05/10/23  6:06 AM  Result Value Ref Range   Glucose-Capillary 91 70 - 99 mg/dL    Comment: Glucose reference range applies only to samples taken after fasting for at least 8 hours.   Comment 1 Notify RN    Comment 2 Document in Chart   Lithium  level     Status: None   Collection Time: 05/10/23  6:21 AM  Result Value Ref Range   Lithium  Lvl 0.89 0.60 - 1.20 mmol/L    Comment: Performed at [facility], [address], [place], [place]  [address]  Glucose, capillary     Status: Abnormal   Collection Time: 05/10/23  5:25 PM  Result Value Ref Range   Glucose-Capillary 126 (H) 70 - 99 mg/dL    Comment: Glucose reference range applies only to samples taken after fasting for at least 8 hours.  Glucose, capillary     Status: None   Collection Time: 05/11/23  8:42 AM  Result Value Ref Range   Glucose-Capillary 95 70 - 99 mg/dL    Comment: Glucose reference range applies only to samples taken after fasting for at least 8 hours.   Comment 1 Notify RN    Comment 2 Document in Chart     Blood Alcohol level:  Lab Results  Component Value Date   ETH <10 05/04/2023   ETH <10 03/23/2023    Metabolic Disorder Labs: Lab Results  Component Value Date   HGBA1C 5.0 03/25/2023   MPG 96.8 03/25/2023   MPG 102.54 09/23/2022   Lab Results  Component Value Date   PROLACTIN 48.3 (H) 08/21/2015   Lab Results  Component Value Date   CHOL 122 03/25/2023   TRIG 112 03/25/2023   HDL 47 03/25/2023   CHOLHDL 2.6 03/25/2023   VLDL 22 03/25/2023   LDLCALC 53 03/25/2023   LDLCALC 44 08/21/2015    Physical Findings: AIMS: Facial and Oral Movements Muscles of Facial Expression: None Lips and Perioral Area: None Jaw: None Tongue: None,Extremity Movements Upper (arms, wrists, hands, fingers): None Lower (legs, knees, ankles, toes): None, Trunk Movements Neck, shoulders, hips: None, Global Judgements Severity of abnormal movements overall : None Incapacitation due to abnormal movements: None Patient's awareness of abnormal movements: No Awareness, Dental Status Current problems with teeth and/or dentures?: No Does patient usually wear dentures?: No Edentia?: No  CIWA:    COWS:  Musculoskeletal: Strength & Muscle Tone: within normal limits Gait & Station: normal Patient leans: N/A  Psychiatric Specialty Exam:  Presentation  General Appearance:  Appropriate for Environment; Casual  Eye Contact: Good  Speech: Clear  and Coherent  Speech Volume: Normal  Handedness: Right   Mood and Affect  Mood: Less anxious; mild depression  Affect: Appropriate   Thought Process  Thought Processes: Coherent  Descriptions of Associations:Intact  Orientation:Full (Time, Place and Person)  Thought Content:Logical  History of Schizophrenia/Schizoaffective disorder:Yes  Duration of Psychotic Symptoms:N/A  Hallucinations:Hallucinations: None  Ideas of Reference:None  Suicidal Thoughts:Suicidal Thoughts: No  Homicidal Thoughts:Homicidal Thoughts: No   Sensorium  Memory: Immediate Good; Recent Fair  Judgment: Good  Insight: Good   Executive Functions  Concentration: Good  Attention Span: Good  Recall: Good  Fund of Knowledge: Good  Language: Good   Psychomotor Activity  Psychomotor Activity: Psychomotor Activity: Normal   Assets  Assets: Communication Skills; Housing; Resilience   Sleep  Sleep: Sleep: Good Number of Hours of Sleep: 7.75    Physical Exam: Physical Exam Vitals reviewed.  Constitutional:      Appearance: She is normal weight.  Pulmonary:     Effort: Pulmonary effort is normal.  Neurological:     Mental Status: She is alert.     Motor: No weakness.     Gait: Gait normal.  Psychiatric:        Behavior: Behavior normal.    Review of Systems  Constitutional:  Negative for chills and fever.  Cardiovascular:  Negative for chest pain and palpitations.  Neurological:  Negative for dizziness, tingling, tremors and headaches.  Psychiatric/Behavioral:  Positive for depression. Negative for hallucinations, memory loss, substance abuse and suicidal ideas. The patient is nervous/anxious. The patient does not have insomnia.   All other systems reviewed and are negative.  Blood pressure 111/70, pulse 97, temperature 97.7 F (36.5 C), temperature source Oral, resp. rate 20, height 5' 4 (1.626 m), weight 104.8 kg, SpO2 99%. Body mass index is 39.65  kg/m.   Treatment Plan Summary: Daily contact with patient to assess and evaluate symptoms and progress in treatment and Medication management Patient as scheduled for discharge however, SW had significant difficulty reaching patient's guardian. SW eventually able to the guardian supervisor but was told no one could pick patient up until 1/7 and that there was no one available at the home.  Diagnoses / Active Problems: Principal Problem:   Schizoaffective disorder, bipolar type  Active Problems:   Mild intellectual disability-per admission H&P     PLAN: Safety and Monitoring:             -- VOLUNTARY admission to inpatient psychiatric unit for safety, stabilization and treatment             -- Daily contact with patient to assess and evaluate symptoms and progress in treatment             -- Patient's case to be discussed in multi-disciplinary team meeting             -- Observation Level : q15 minute checks             -- Vital signs:  q12 hours             -- Precautions: suicide, elopement, and assault   2. Psychiatric Diagnoses and Treatment:              --Continue  home Lithium  carbonate 1200 at bedtime for mood stabilization  12/27 Li level 0.11                         01/01/205 Li level 0.89 wnl              --Continue home celexa  20mg  daily for depression             --Changed lybalvi  to zyprexa  10mg  at bedtime as not on formulary while in hospital             --Continue trazodone  150 at bedtime for sleep    -- The risks/benefits/side-effects/alternatives to this medication were discussed in detail with the patient and time was given for questions. The patient consents to medication trial.              -- Metabolic profile and EKG monitoring obtained while on an atypical antipsychotic  BMI: 39.65 TSH:  Recent Labs       Lab Results  Component Value Date    TSH 2.091 05/04/2023      Lipid Panel:  Recent Labs       Lab Results  Component  Value Date    CHOL 122 03/25/2023    HDL 47 03/25/2023    LDLCALC 53 03/25/2023    TRIG 112 03/25/2023    CHOLHDL 2.6 03/25/2023      HbgA1c:  Recent Labs       Lab Results  Component Value Date    HGBA1C 5.0 03/25/2023      QTc: 457 12/26             -- Encouraged patient to participate in unit milieu and in scheduled group therapies              Other PRNS:               --  acetaminophen  650 mg every 6 hours as needed for mild to moderate pain, fever, and headaches              --  hydroxyzine  25 mg three times a day as needed for anxiety              --  aluminum-magnesium  hydroxide + simethicone  30 mL every 4 hours as needed for heartburn             -- As needed agitation protocol in-place              3. Medical Issues Being Addressed:  --Continue home B12 for B12 deficiency --Continue home levothyroxine  for hypothyroidism --Continue home metformin  for diabetes  --Continue pantoprazole  40mg  for GERD --At previous admission, had home medication of eliquis  for history of PE. Per hospital discharge summary from 09/2022, eliquis  was meant to be a 6 month course. Eliquis  was not on current home medications.  -- Continue neosporin for tear between her toes.    4. Discharge Planning:              -- Social work and case management to assist with discharge planning and identification of hospital follow-up needs prior to discharge             -- Estimated LOS: 2-3 more days -disposition is pending placement -social work to coordinate with legal guardian             -- Discharge Concerns: Need to establish a safety plan; Medication compliance and effectiveness             -- Discharge Goals:  Return home with outpatient referrals for mental health follow-up including medication management/psychotherapy    PGY-4 Kalel Harty B Harm Jou, MD 05/11/2023, 12:21 PM  Total Time Spent in Direct Patient Care:  I personally spent 35 minutes on the unit in direct patient care. The direct patient  care time included face-to-face time with the patient, reviewing the patient's chart, communicating with other professionals, and coordinating care. Greater than 50% of this time was spent in counseling or coordinating care with the patient regarding goals of hospitalization, psycho-education, and discharge planning needs.

## 2023-05-11 NOTE — Discharge Summary (Signed)
 Physician Discharge Summary Note  Patient:  Holly [name] is an 28 y.o., female MRN:  [id number] DOB:  May 11, 1995 Patient phone:  [phone] (home)  Patient address:   [address] [place] [place] [address],  Total Time spent with patient: 20 minutes  Date of Admission:  05/05/2023 Date of Discharge: 05/12/2023  Reason for Admission:  worsening depression, SI with a plan to cut her wrist secondary to stress related to living situation at ALF   Holly [name] is a 28 y.o., female with a past psychiatric history significant for schizoaffective disorder and mild intellectual disability who presents to the [facility] from behavioral health urgent care for evaluation and management of worsening depression and SI with a plan to cut her wrist to kill herself.  According to outside records, the patient presented there with worsening depression, SI with a plan to cut her wrist secondary to stress related to living situation at ALF  Principal Problem: Schizoaffective disorder, bipolar type [facility]) Discharge Diagnoses: Principal Problem:   Schizoaffective disorder, bipolar type (HCC) Active Problems:   Mild intellectual disability   Past Psychiatric History: Current Psychiatrist: Idell [name] Current Therapist: no therapist for 1.5 years Previous Psychiatric Diagnoses: schizoaffective disorder bipolar type, intellectual disability, ADHD, PTSD Current psychiatric medications:            Tylenol  PRN, celexa  20, B12 1000 daily, synthroid  100 daily, lithium  1200 at bedtime, lybalvi  10 at bedtime, metformin  [phone], trazodone  150 at bedtime  Psychiatric medication history/compliance: -prozac  20, celexa  20, amantadine  100 BID, abilify  10, lithium  900 at bedtime, trazodone  300, cogentin  1, doxepin  25, zyprexa  15 [facility] 08/2015: Schizoaffective disorder bipolar type  Meds:  prozac  20, lithium  450 CR BID, zyprexa  15  Psychiatric Hospitalization hx: 8 Prior Psychiatric  Hospitalizations (2000, 2007x2, 2008, 2015, 2016, 2017x2, 03/2023) Psychotherapy hx: history of seeing therapist Neuromodulation history: none History of suicide (obtained from HPI): multiple suicide attempts, most recently 2017 after ingesting 6-8 AA batteries as a suicidal gesture History of homicide or aggression (obtained in HPI): none  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   Obesity    PTSD (post-traumatic stress disorder)     Past Surgical History:  Procedure Laterality Date   IR ANGIOGRAM PULMONARY BILATERAL SELECTIVE  09/23/2022   IR THROMBECT PRIM MECH INIT (INCLU) MOD SED  09/23/2022   IR US  GUIDE VASC ACCESS RIGHT  09/23/2022   NO PAST SURGERIES     None     Family History:  Family History  Adopted: Yes   Family Psychiatric  History: Mother with alcoholism   Social History:  Social History   Substance and Sexual Activity  Alcohol Use No     Social History   Substance and Sexual Activity  Drug Use No    Social History   Socioeconomic History   Marital status: Single    Spouse name: Not on file   Number of children: Not on file   Years of education: Not on file   Highest education level: Not on file  Occupational History   Not on file  Tobacco Use   Smoking status: Never   Smokeless tobacco: Never  Vaping Use   Vaping status: Never Used  Substance and Sexual Activity   Alcohol use: No   Drug use: No   Sexual activity: Never    Birth control/protection: Injection  Other Topics Concern   Not on file  Social History Narrative   ** Merged History Encounter **       Social Drivers of Health   Financial Resource Strain: Not on file  Food Insecurity: Food Insecurity Present (05/05/2023)    Hunger Vital Sign    Worried About Running Out of Food in the Last Year: Sometimes true    Ran Out of Food in the Last Year: Sometimes true  Transportation Needs: Unmet Transportation Needs (05/05/2023)   PRAPARE - Administrator, Civil Service (Medical): Yes    Lack of Transportation (Non-Medical): Yes  Physical Activity: Not on file  Stress: Not on file  Social Connections: Not on file    Hospital Course:  Holly [name] is a 28 y.o., female with a past psychiatric history significant for schizoaffective disorder and mild intellectual disability who presents to the [facility] from behavioral health urgent care for evaluation and management of worsening depression and SI with a plan to cut her wrist to kill herself. According to outside records, the patient presented there with worsening depression, SI with a plan to cut her wrist secondary to stress related to living situation at ALF    On day of discharge patient denied SI, HI ,and AVH. Patient endorsed that she was anxious about having to return to the group home, but I have to even thought I do not want to is what patient said about this. Patient denied having urges to cut and endorsed that she is trying better cope with her situation than before. Patient endorsed that she is happy to hear that she will be getting a new group home soon. Patient endorses that although she is anxious about a new group home she finds this to be some improvement on her situation and concerns. Patient endorsed that she knows how to get help before she tries to harm herself in the future if she starts to feel overwhelmed. Patient endorses a good appetite and sleep. She has enjoyed spending time on the mileu.   During the patient's hospitalization, patient had extensive initial psychiatric evaluation, and follow-up psychiatric evaluations every day.  Psychiatric diagnoses provided upon initial assessment:    Schizoaffective  disorder, bipolar type (HCC)   Mild intellectual disability  Patient's psychiatric medications were adjusted on admission: All of patient's home medications were restarted: -- Increase lithium  carbonate from 900 mg to 1200 mg at bedtime for mood stabilization (patient reported taking 1200mg  despite orders showing 900mg  initial Li level was 0.11 final level was 0.89 on 05-10-23) -- Continue Celexa  20 mg daily for depression -- Continue cyanocobalamin  1000 mg daily for vitamin B12 deficiency -- Continue levothyroxine  100 mg daily for hypothyroidism -- Continue metformin  500 mg daily and 1000 mg at bedtime for history of diabetes currently controlled -- change involving home Lybalvi  to Zyprexa  10 mg at bedtime as nonformulary -- continue trazodone  150 mg at bedtime for sleep   During the hospitalization, other adjustments were made to the patient's psychiatric medication regimen: None  Gradually, patient started adjusting to milieu.   Patient's care was discussed during the interdisciplinary team meeting every day during the hospitalization.  The patient denied having side effects to prescribed psychiatric medication.  The patient reports their target psychiatric symptoms of  SI and impulse control responded well to the psychiatric medications, and the patient reports overall benefit other psychiatric hospitalization. Supportive psychotherapy was provided to the patient. The patient also participated in regular group therapy while admitted.   Labs were reviewed with the patient, and abnormal results were discussed with the patient.  The patient denied having suicidal thoughts more than 48 hours prior to discharge.  Patient denies having homicidal thoughts.  Patient denies having auditory hallucinations.  Patient denies any visual hallucinations.  Patient denies having paranoid thoughts.  The patient is able to verbalize their individual safety plan to this provider.  It is recommended to the  patient to continue psychiatric medications as prescribed, after discharge from the hospital.    It is recommended to the patient to follow up with your outpatient psychiatric provider and PCP.  Discussed with the patient, the impact of alcohol, drugs, tobacco have been there overall psychiatric and medical wellbeing, and total abstinence from substance use was recommended the patient.   Physical Findings: AIMS: Facial and Oral Movements Muscles of Facial Expression: None Lips and Perioral Area: None Jaw: None Tongue: None,Extremity Movements Upper (arms, wrists, hands, fingers): None Lower (legs, knees, ankles, toes): None, Trunk Movements Neck, shoulders, hips: None, Global Judgements Severity of abnormal movements overall : None Incapacitation due to abnormal movements: None Patient's awareness of abnormal movements: No Awareness, Dental Status Current problems with teeth and/or dentures?: No Does patient usually wear dentures?: No Edentia?: No  CIWA:    COWS:     Aims score zero on my exam. No eps on my exam.   Musculoskeletal: Strength & Muscle Tone: within normal limits Gait & Station: normal Patient leans: N/A   Psychiatric Specialty Exam:  Presentation  General Appearance:  Appropriate for Environment; Casual; Fairly Groomed  Eye Contact: Good  Speech: Normal Rate; Clear and Coherent  Speech Volume: Normal  Handedness: Right   Mood and Affect  Mood: Euthymic  Affect: Appropriate; Congruent; Full Range   Thought Process  Thought Processes: Linear  Descriptions of Associations:Intact  Orientation:Full (Time, Place and Person)  Thought Content:Logical  History of Schizophrenia/Schizoaffective disorder: yes  Duration of Psychotic Symptoms: <6 months  Hallucinations:Hallucinations: None  Ideas of Reference:None  Suicidal Thoughts:Suicidal Thoughts: No  Homicidal Thoughts:Homicidal Thoughts: No   Sensorium  Memory: Immediate Good;  Recent Good; Remote Good  Judgment: Good  Insight: Good   Executive Functions  Concentration: Good  Attention Span: Good  Recall: Good  Fund of Knowledge: Good  Language: Good   Psychomotor Activity  Psychomotor Activity: Psychomotor Activity: Normal   Assets  Assets: Communication Skills; Housing; Resilience   Sleep  Sleep: Sleep: Fair Number of Hours of Sleep: 7.75    Physical Exam: Physical Exam Constitutional:      Appearance: Normal appearance.  HENT:     Head: Normocephalic and atraumatic.  Pulmonary:     Effort: Pulmonary effort is normal.  Neurological:     Mental Status: She is alert and oriented to person, place, and time.    Review of Systems  Psychiatric/Behavioral:  Negative for hallucinations and suicidal ideas. The patient does not have insomnia.    Blood pressure 113/78, pulse 90, temperature 97.9 F (36.6 C), temperature source Oral, resp. rate 20, height 5' 4 (1.626 m), weight 104.8 kg, SpO2 100%. Body mass index is 39.65 kg/m.   Social History   Tobacco Use  Smoking Status Never  Smokeless Tobacco Never   Tobacco Cessation:  N/A, patient does not currently use  tobacco products   Blood Alcohol level:  Lab Results  Component Value Date   ETH <10 05/04/2023   ETH <10 03/23/2023    Metabolic Disorder Labs:  Lab Results  Component Value Date   HGBA1C 5.0 03/25/2023   MPG 96.8 03/25/2023   MPG 102.54 09/23/2022   Lab Results  Component Value Date   PROLACTIN 48.3 (H) 08/21/2015   Lab Results  Component Value Date   CHOL 122 03/25/2023   TRIG 112 03/25/2023   HDL 47 03/25/2023   CHOLHDL 2.6 03/25/2023   VLDL 22 03/25/2023   LDLCALC 53 03/25/2023   LDLCALC 44 08/21/2015    Discharge destination:  Other:  Group home  Is patient on multiple antipsychotic therapies at discharge:  No   Has Patient had three or more failed trials of antipsychotic monotherapy by history:  No  Recommended Plan for Multiple  Antipsychotic Therapies: NA  Discharge Instructions     Diet - low sodium heart healthy   Complete by: As directed    Diet - low sodium heart healthy   Complete by: As directed    Increase activity slowly   Complete by: As directed    Increase activity slowly   Complete by: As directed       Allergies as of 05/12/2023   No Known Allergies      Medication List     STOP taking these medications    lithium  300 MG tablet Replaced by: lithium  600 MG capsule       TAKE these medications      Indication  citalopram  20 MG tablet Commonly known as: CELEXA  Take 1 tablet (20 mg total) by mouth daily.  Indication: Generalized Anxiety Disorder   Clindamy-Benzoyl Per-Niacinam 1-2.5-4 % Gel Apply 1 Application topically daily as needed (Apply to facial acne).  Indication: acne   clotrimazole-betamethasone cream Commonly known as: LOTRISONE Apply 1 Application topically 2 (two) times daily. Apply a thin layer to affected area  Indication: Athlete's Foot   GoodSense ClearLax 17 GM/SCOOP powder Generic drug: polyethylene glycol powder Take 17 g by mouth daily as needed for moderate constipation.  Indication: Constipation   hydrOXYzine  25 MG tablet Commonly known as: ATARAX  Take 1 tablet (25 mg total) by mouth 3 (three) times daily as needed for anxiety.  Indication: Feeling Anxious   levothyroxine  100 MCG tablet Commonly known as: SYNTHROID  Take 1 tablet (100 mcg total) by mouth daily.  Indication: Underactive Thyroid    linaclotide  290 MCG Caps capsule Commonly known as: Linzess  Take 1 capsule (290 mcg total) by mouth daily.  Indication: Constipation caused by Irritable Bowel Syndrome   lithium  600 MG capsule Take 2 capsules (1,200 mg total) by mouth at bedtime. Replaces: lithium  300 MG tablet  Indication: Manic-Depression, Schizoaffective Disorder   Lybalvi  10-10 MG Tabs Generic drug: OLANZapine -Samidorphan Take 1 tablet by mouth at bedtime.  Indication:  Schizophrenia   metFORMIN  1000 MG tablet Commonly known as: GLUCOPHAGE  Take 1 tablet (1,000 mg total) by mouth at bedtime.  Indication: Body Weight Gain due to Antipsychotic Medication Use   metFORMIN  500 MG tablet Commonly known as: GLUCOPHAGE  Take 1 tablet (500 mg total) by mouth daily.  Indication: Type 2 Diabetes   neomycin -bacitracin -polymyxin Oint Commonly known as: NEOSPORIN Apply 1 Application topically as needed for irritation (in between 3rd and 4th toe).  Indication: dry skin   omeprazole  20 MG capsule Commonly known as: PRILOSEC Take 20 mg by mouth daily. What changed: Another medication with the same name  was added. Make sure you understand how and when to take each.  Indication: Gastroesophageal Reflux Disease   omeprazole  20 MG capsule Commonly known as: PRILOSEC Take 1 capsule (20 mg total) by mouth daily. What changed: You were already taking a medication with the same name, and this prescription was added. Make sure you understand how and when to take each.  Indication: Heartburn   Selenium Sulfide 2.25 % Sham Apply 1 Application topically 2 (two) times a week.  Indication: Dandruff   traZODone  150 MG tablet Commonly known as: DESYREL  Take 1 tablet (150 mg total) by mouth at bedtime.  Indication: Trouble Sleeping   Trulance 3 MG Tabs Generic drug: Plecanatide Take 3 mg by mouth daily as needed (For constipation).  Indication: Chronic Constipation of Unknown Cause        Follow-up Information     Vinita Amble, PA-C. Go on 06/07/2023.   Specialty: Physician Assistant Why: You have an appointment on 06/07/23 at 10:20 am for medication management services, in person. Contact information: [address] [place] [address] [phone]         [facility] Go to.   Specialty: Professional Counselor Why: Please go to this provider for therapy services on 05/15/23 at 9:00AM to establish services.  Walk in hours  for new patients are Monday through Friday, from 9 am to 1 pm. Contact information: [address] [place]  [address] [place] [address] [phone]                 Follow-up recommendations: Activity: as tolerated  Diet: heart healthy  Other: -Follow-up with your outpatient psychiatric provider -instructions on appointment date, time, and address (location) are provided to you in discharge paperwork.  -Take your psychiatric medications as prescribed at discharge - instructions are provided to you in the discharge paperwork  -Follow-up with outpatient primary care doctor and other specialists -for management of preventative medicine and chronic medical disease: T2DM  -Testing: Follow-up with outpatient provider for lab results: Lucillie level on 05-10-23: 0.89   -If you are prescribed an atypical antipsychotic medication, we recommend that your outpatient psychiatrist follow routine screening for side effects within 3 months of discharge, including monitoring: AIMS scale, height, weight, blood pressure, fasting lipid panel, HbA1c, and fasting blood sugar.   -Recommend total abstinence from alcohol, tobacco, and other illicit drug use at discharge.   -If your psychiatric symptoms recur, worsen, or if you have side effects to your psychiatric medications, call your outpatient psychiatric provider, [phone] or go to the nearest emergency department.  -If suicidal thoughts occur, immediately call your outpatient psychiatric provider, 911, [phone] or go to the nearest emergency department.   Rankin Montes, MD 05/12/2023, 10:22 AM  Total Time Spent in Direct Patient Care:  I personally spent 35 minutes on the unit in direct patient care. The direct patient care time included face-to-face time with the patient, reviewing the patient's chart, communicating with other professionals, and coordinating care. Greater than 50% of this time was spent in counseling or coordinating care with the patient  regarding goals of hospitalization, psycho-education, and discharge planning needs.   Rankin Montes, MD Psychiatrist

## 2023-05-11 NOTE — Progress Notes (Signed)
 CSW spoke with [facility] guardian supervisor and patients Legal Guarding(LG) after several unsuccessful attempts to contact Macky Ege QP manager of alternative Home that pt lives in. [phone].   Legal Guardian Lauraine Becker, [phone] informed CSW that she will be working with the care coordinator regarding new placement for the patient upon discharge, if needed w/ the understanding that the pt will be given 60days from the date of admission into home to find a new place. Being that the group home has already submitted paperwork for termination. She also voiced concern about uncertainty about the patient returning to the home after discharge. LG reached out to make contact with the QP for clarity.   CSW stressed the importance of continuity of care and placement for the patient. LG reported that she's not responsible to find placement, the care coordinator will do that. CSW informed QP that if there is a concern and need for placement the CSW team can assist if needed. Several messages were left, CSW received a call from QP reporting that she is out of town and not able to pick the patient up until 05/16/19 @9 -10am. Also reporting that there is no one at the home at all and the pt doesn't have access to the home and required supervised activity.  Care team has been notified of d/c udate  Golda Louder LCSWA 05/11/23

## 2023-05-11 NOTE — Group Note (Signed)
 Date:  05/11/2023 Time:  9:43 PM  Group Topic/Focus:  Wrap-Up Group:   The focus of this group is to help patients review their daily goal of treatment and discuss progress on daily workbooks.    Participation Level:  Active  Participation Quality:  Appropriate and Sharing  Affect:  Appropriate  Cognitive:  Appropriate  Insight: Appropriate  Engagement in Group:  Engaged  Modes of Intervention:  Activity and Socialization  Additional Comments:  Patients used and completed wrap-up groups sheet and shared one things from paper with the group. The patient rated her day a 5 out of 10. Patient goal for today was to talk to someone about a new guardian and patient wrote that she calling someone and they will get back / or call them tomorrow morning. Patient shared a coping skills that he finds most helpful is music. One thing the patient likes about herself is that she is smart. Patient participated in activity after sharing.   Eward Mace 05/11/2023, 9:43 PM

## 2023-05-11 NOTE — BHH Suicide Risk Assessment (Deleted)
 Suicide Risk Assessment  Discharge Assessment    [facility] Discharge Suicide Risk Assessment   Principal Problem: Schizoaffective disorder, bipolar type [facility]) Discharge Diagnoses: Principal Problem:   Schizoaffective disorder, bipolar type (HCC) Active Problems:   Mild intellectual disability   Total Time spent with patient: 20 minutes  [name] is a 28 y.o., female with a past psychiatric history significant for schizoaffective disorder and mild intellectual disability who presents to the [facility] from behavioral health urgent care for evaluation and management of worsening depression and SI with a plan to cut her wrist to kill herself.  According to outside records, the patient presented there with worsening depression, SI with a plan to cut her wrist secondary to stress related to living situation at ALF.  During the patient's hospitalization, patient had extensive initial psychiatric evaluation, and follow-up psychiatric evaluations every day.   Psychiatric diagnoses provided upon initial assessment:  Principal Problem:   Schizoaffective disorder, bipolar type (HCC) Active Problems:   Mild intellectual disability   Patient's psychiatric medications were adjusted on admission: All of patient's home medications were restarted: -- lithium  carbonate 1200 mg at bedtime for mood stabilization -- Celexa  20 mg daily for depression -- cyanocobalamin  1000 mg daily for vitamin B12 deficiency -- levothyroxine  100 mg daily for hypothyroidism -- metformin  500 mg daily and 1000 mg at bedtime for history of diabetes currently controlled -- change involving home Lybalvi  to Zyprexa  10 mg at bedtime as nonformulary -- continue trazodone  150 mg at bedtime for sleep      During the hospitalization, other adjustments were made to the patient's psychiatric medication regimen: None   Gradually, patient started adjusting to milieu.   Patient's care was discussed during the  interdisciplinary team meeting every day during the hospitalization.   The patient denied having side effects to prescribed psychiatric medication.   The patient reports their target psychiatric symptoms of SI nd impulse control responded well to the psychiatric medications, and the patient reports overall benefit other psychiatric hospitalization. Supportive psychotherapy was provided to the patient. The patient also participated in regular group therapy while admitted.    Labs were reviewed with the patient, and abnormal results were discussed with the patient.   The patient denied having suicidal thoughts more than 48 hours prior to discharge.  Patient denies having homicidal thoughts.  Patient denies having auditory hallucinations.  Patient denies any visual hallucinations.  Patient denies having paranoid thoughts.   The patient is able to verbalize their individual safety plan to this provider.   It is recommended to the patient to continue psychiatric medications as prescribed, after discharge from the hospital.     It is recommended to the patient to follow up with your outpatient psychiatric provider and PCP.   Discussed with the patient, the impact of alcohol, drugs, tobacco have been there overall psychiatric and medical wellbeing, and total abstinence from substance use was recommended the patient.    Musculoskeletal: Strength & Muscle Tone: within normal limits Gait & Station: normal Patient leans: N/A  Psychiatric Specialty Exam  Presentation  General Appearance:  Appropriate for Environment; Casual  Eye Contact: Good  Speech: Clear and Coherent  Speech Volume: Normal  Handedness: Right   Mood and Affect  Mood: Anxious  Duration of Depression Symptoms: Less than two weeks  Affect: Appropriate   Thought Process  Thought Processes: Coherent  Descriptions of Associations:Intact  Orientation:Full (Time, Place and Person)  Thought  Content:Logical  History of Schizophrenia/Schizoaffective disorder:Yes  Duration  of Psychotic Symptoms:N/A  Hallucinations:Hallucinations: None  Ideas of Reference:None  Suicidal Thoughts:Suicidal Thoughts: No  Homicidal Thoughts:Homicidal Thoughts: No   Sensorium  Memory: Immediate Good; Recent Fair  Judgment: Good  Insight: Good   Executive Functions  Concentration: Good  Attention Span: Good  Recall: Good  Fund of Knowledge: Good  Language: Good   Psychomotor Activity  Psychomotor Activity: Psychomotor Activity: Normal   Assets  Assets: Communication Skills; Housing; Resilience   Sleep  Sleep: Sleep: Good Number of Hours of Sleep: 7.75   Physical Exam: Physical Exam Constitutional:      Appearance: Normal appearance.  HENT:     Head: Normocephalic and atraumatic.  Pulmonary:     Effort: Pulmonary effort is normal.  Neurological:     Mental Status: She is alert and oriented to person, place, and time.    Review of Systems  Psychiatric/Behavioral:  Negative for hallucinations and suicidal ideas. The patient does not have insomnia.    Blood pressure 111/70, pulse 97, temperature 97.7 F (36.5 C), temperature source Oral, resp. rate 20, height 5' 4 (1.626 m), weight 104.8 kg, SpO2 99%. Body mass index is 39.65 kg/m.  Mental Status Per Nursing Assessment::   On Admission:  Suicidal ideation indicated by patient  Demographic Factors:  Caucasian  Loss Factors: NA  Historical Factors: Family history of mental illness or substance abuse  Risk Reduction Factors:   Living with another person, especially a relative  Continued Clinical Symptoms:  Unstable or Poor Therapeutic Relationship  Cognitive Features That Contribute To Risk:  None    Suicide Risk:  Mild:  Suicidal ideation of limited frequency, intensity, duration, and specificity.  There are no identifiable plans, no associated intent, mild dysphoria and related  symptoms, fair self-control (both objective and subjective assessment), few other risk factors, and identifiable protective factors, including available and accessible social support.   Follow-up Information     Holly Amble, PA-C. Go on 06/07/2023.   Specialty: Physician Assistant Why: You have an appointment on 06/07/23 at 10:20 am for medication management services, in person. Contact information: [address] [place] [address] [phone]         [place] Go to.   Specialty: Professional Counselor Why: Please go to this provider for therapy services.  Walk in hours for new patients are Monday through Friday, from 9 am to 1 pm. Contact information: [address] [place]  [address] [place] [address] [phone]                 Plan Of Care/Follow-up recommendations:  Activity: as tolerated   Diet: heart healthy   Other: -Follow-up with your outpatient psychiatric provider -instructions on appointment date, time, and address (location) are provided to you in discharge paperwork.   -Take your psychiatric medications as prescribed at discharge - instructions are provided to you in the discharge paperwork   -Follow-up with outpatient primary care doctor and other specialists -for management of preventative medicine and chronic medical disease: T2DM   -Testing: Follow-up with outpatient provider for abnormal lab results: N/A   -If you are prescribed an atypical antipsychotic medication, we recommend that your outpatient psychiatrist follow routine screening for side effects within 3 months of discharge, including monitoring: AIMS scale, height, weight, blood pressure, fasting lipid panel, HbA1c, and fasting blood sugar.    -Recommend total abstinence from alcohol, tobacco, and other illicit drug use at discharge.    -If your psychiatric symptoms recur, worsen, or if you  have side effects to your psychiatric medications, call  your outpatient psychiatric provider, 911, 988 or go to the nearest emergency department.   -If suicidal thoughts occur, immediately call your outpatient psychiatric provider, 911, 988 or go to the nearest emergency department.  PGY-4 Reggie KATHEE Rice, MD 05/11/2023, 10:04 AM

## 2023-05-11 NOTE — Discharge Instructions (Signed)
 -  Follow-up with your outpatient psychiatric provider -instructions on appointment date, time, and address (location) are provided to you in discharge paperwork.  -Take your psychiatric medications as prescribed at discharge - instructions are provided to you in the discharge paperwork  -Follow-up with outpatient primary care doctor and other specialists -for management of preventative medicine and any chronic medical disease.  -Recommend abstinence from alcohol, tobacco, and other illicit drug use at discharge.   -If your psychiatric symptoms recur, worsen, or if you have side effects to your psychiatric medications, call your outpatient psychiatric provider, 911, [phone] or go to the nearest emergency department.  -If suicidal thoughts occur, call your outpatient psychiatric provider, 911, 988 or go to the nearest emergency department.

## 2023-05-11 NOTE — Hospital Course (Signed)
 Holly [name] is a 28 y.o., female with a past psychiatric history significant for schizoaffective disorder and mild intellectual disability who presents to the [facility] from behavioral health urgent care for evaluation and management of worsening depression and SI with a plan to cut her wrist to kill herself.  According to outside records, the patient presented there with worsening depression, SI with a plan to cut her wrist secondary to stress related to living situation at ALF.   During the patient's hospitalization, patient had extensive initial psychiatric evaluation, and follow-up psychiatric evaluations every day.   Psychiatric diagnoses provided upon initial assessment:  Principal Problem:   Schizoaffective disorder, bipolar type (HCC) Active Problems:   Mild intellectual disability   Patient's psychiatric medications were adjusted on admission: All of patient's home medications were restarted: -- lithium  carbonate 1200 mg at bedtime for mood stabilization -- Celexa  20 mg daily for depression -- cyanocobalamin  1000 mg daily for vitamin B12 deficiency -- levothyroxine  100 mg daily for hypothyroidism -- metformin  500 mg daily and 1000 mg at bedtime for history of diabetes currently controlled -- change involving home Lybalvi  to Zyprexa  10 mg at bedtime as nonformulary -- continue trazodone  150 mg at bedtime for sleep      During the hospitalization, other adjustments were made to the patient's psychiatric medication regimen: None   Gradually, patient started adjusting to milieu.   Patient's care was discussed during the interdisciplinary team meeting every day during the hospitalization.   The patient denied having side effects to prescribed psychiatric medication.   The patient reports their target psychiatric symptoms of SI nd impulse control responded well to the psychiatric medications, and the patient reports overall benefit other psychiatric  hospitalization. Supportive psychotherapy was provided to the patient. The patient also participated in regular group therapy while admitted.    Labs were reviewed with the patient, and abnormal results were discussed with the patient.   The patient denied having suicidal thoughts more than 48 hours prior to discharge.  Patient denies having homicidal thoughts.  Patient denies having auditory hallucinations.  Patient denies any visual hallucinations.  Patient denies having paranoid thoughts.   The patient is able to verbalize their individual safety plan to this provider.   It is recommended to the patient to continue psychiatric medications as prescribed, after discharge from the hospital.     It is recommended to the patient to follow up with your outpatient psychiatric provider and PCP.   Discussed with the patient, the impact of alcohol, drugs, tobacco have been there overall psychiatric and medical wellbeing, and total abstinence from substance use was recommended the patient.  _____ Plan Of Care/Follow-up recommendations:  Activity: as tolerated   Diet: heart healthy   Other: -Follow-up with your outpatient psychiatric provider -instructions on appointment date, time, and address (location) are provided to you in discharge paperwork.   -Take your psychiatric medications as prescribed at discharge - instructions are provided to you in the discharge paperwork   -Follow-up with outpatient primary care doctor and other specialists -for management of preventative medicine and chronic medical disease: T2DM   -Testing: Follow-up with outpatient provider for abnormal lab results: N/A   -If you are prescribed an atypical antipsychotic medication, we recommend that your outpatient psychiatrist follow routine screening for side effects within 3 months of discharge, including monitoring: AIMS scale, height, weight, blood pressure, fasting lipid panel, HbA1c, and fasting blood sugar.     -Recommend total abstinence from alcohol, tobacco, and other  illicit drug use at discharge.    -If your psychiatric symptoms recur, worsen, or if you have side effects to your psychiatric medications, call your outpatient psychiatric provider, 911, 988 or go to the nearest emergency department.   -If suicidal thoughts occur, immediately call your outpatient psychiatric provider, 911, 988 or go to the nearest emergency department.

## 2023-05-11 NOTE — BHH Suicide Risk Assessment (Signed)
 BHH INPATIENT:  Family/Significant Other Suicide Prevention Education  Suicide Prevention Education:  Education Completed; Macky Ege QP manager of alternative Home that pt lives in. [phone],  (name of family member/significant other) has been identified by the patient as the family member/significant other with whom the patient will be residing, and identified as the person(s) who will aid the patient in the event of a mental health crisis (suicidal ideations/suicide attempt).  With written consent from the patient, the family member/significant other has been provided the following suicide prevention education, prior to the and/or following the discharge of the patient.  The suicide prevention education provided includes the following: Suicide risk factors Suicide prevention and interventions National Suicide Hotline telephone number [facility] assessment telephone number [facility] Emergency Assistance 911 [facility] and/or Residential Mobile Crisis Unit telephone number  Request made of family/significant other to: Remove weapons (e.g., guns, rifles, knives), all items previously/currently identified as safety concern.   Remove drugs/medications (over-the-counter, prescriptions, illicit drugs), all items previously/currently identified as a safety concern.  The family member/significant other verbalizes understanding of the suicide prevention education information provided.  The family member/significant other agrees to remove the items of safety concern listed above.  Golda Louder LCSWA 05/11/2023, 2:07 PM

## 2023-05-11 NOTE — Progress Notes (Signed)
  CSW contacted legal guardian regarding patients discharge. LG provided contact information for  QP, which is the assigned person to coordinate p/u for pt. CSW attempted to contact Macky Ege [phone].  CSW left VM and will call back to followup.   Golda Louder [facility] 05/11/23 @0910

## 2023-05-11 NOTE — Progress Notes (Signed)
  [facility] Adult Case Management Discharge Plan :  Will you be returning to the same living situation after discharge:  Yes,  the patient is scheduling to d/c tomorrow at 5 pm temporarily. Until new placement is found. LG reported that she was given 60days to find replacement. Macky Ege QP manager of alternative Home that pt lives in. [phone] At discharge, do you have transportation home?: Yes,  Macky Ege QP manager of alternative Home that pt lives in. [phone] Do you have the ability to pay for your medications: Yes,   [employer] / [employer]  Release of information consent forms completed and in the chart;  Patient's signature needed at discharge.  Patient to Follow up at:  Follow-up Information     Vinita Amble, PA-C. Go on 06/07/2023.   Specialty: Physician Assistant Why: You have an appointment on 06/07/23 at 10:20 am for medication management services, in person. Contact information: [address] [place] [address] [phone]         [place], [facility] Go to.   Specialty: Professional Counselor Why: Please go to this provider for therapy services.  Walk in hours for new patients are Monday through Friday, from 9 am to 1 pm. Contact information: [address] [place]  [address] [place] [address] [phone]                 Next level of care provider has access to [facility] Link:no  Safety Planning and Suicide Prevention discussed: Yes with legal guardian, Lauraine Becker, [phone]. And provider: Macky Ege QP manager of alternative Home that pt lives in. [phone] Has patient been referred to the Quitline?: Patient does not use tobacco/nicotine  products  Patient has been referred for addiction treatment: No known substance use disorder.  Golda Louder, LCSWA 05/11/2023, 2:23 PM

## 2023-05-11 NOTE — BHH Group Notes (Signed)
 BHH Group Notes:  (Nursing/MHT/Case Management/Adjunct)  Date:  05/11/2023  Time:  9:30 AM  Type of Therapy:  Group Topic/ Focus: Goals Group: The focus of this group is to help patients establish daily goals to achieve during treatment and discuss how the patient can incorporate goal setting into their daily lives to aide in recovery.   Participation Level:  Did Not Attend  Summary of Progress/Problems:  Patient did not attend goals/ orientation group today. Patient was encouraged but refused.   Anarely Nicholls R Tyde Lamison 05/11/2023, 9:30 AM

## 2023-05-11 NOTE — Plan of Care (Signed)
  Problem: Education: Goal: Knowledge of [facility] Education information/materials will improve Outcome: Progressing   Problem: Activity: Goal: Interest or engagement in activities will improve Outcome: Progressing   Problem: Coping: Goal: Ability to verbalize frustrations and anger appropriately will improve Outcome: Progressing   Problem: Education: Goal: Utilization of techniques to improve thought processes will improve Outcome: Progressing

## 2023-05-11 NOTE — Progress Notes (Signed)
   05/10/23 2300  Psych Admission Type (Psych Patients Only)  Admission Status Voluntary  Psychosocial Assessment  Patient Complaints None  Eye Contact Fair  Facial Expression Animated  Affect Appropriate to circumstance  Speech Logical/coherent  Interaction Assertive  Motor Activity Restless  Appearance/Hygiene Unremarkable  Behavior Characteristics Cooperative;Appropriate to situation  Mood Pleasant  Thought Process  Coherency WDL  Content WDL  Delusions None reported or observed  Perception WDL  Hallucination None reported or observed  Judgment Poor  Confusion None  Danger to Self  Current suicidal ideation? Denies  Self-Injurious Behavior No self-injurious ideation or behavior indicators observed or expressed   Agreement Not to Harm Self Yes  Description of Agreement verbal  Danger to Others  Danger to Others None reported or observed

## 2023-05-11 NOTE — Progress Notes (Signed)
 D: Patient is alert, oriented, pleasant, and cooperative. Denies SI, HI, AVH, and verbally contracts for safety. Patient reports headache rated 4/10.   A: Scheduled medications administered per MD order. PRN tylenol  administered and was effective. Support provided. Patient educated on safety on the unit and medications. Routine safety checks every 15 minutes. Patient stated understanding to tell nurse about any new physical symptoms. Patient understands to tell staff of any needs.     R: No adverse drug reactions noted. Patient remains safe at this time and will continue to monitor.    05/11/23 0900  Psych Admission Type (Psych Patients Only)  Admission Status Voluntary  Psychosocial Assessment  Patient Complaints Other (Comment) (lightheadedness)  Eye Contact Fair  Facial Expression Anxious  Affect Appropriate to circumstance  Speech Logical/coherent  Interaction Assertive  Motor Activity Restless  Appearance/Hygiene Unremarkable  Behavior Characteristics Cooperative;Appropriate to situation  Mood Anxious;Depressed  Thought Process  Coherency WDL  Content WDL  Delusions None reported or observed  Perception WDL  Hallucination None reported or observed  Judgment Poor  Confusion None  Danger to Self  Current suicidal ideation? Denies  Self-Injurious Behavior No self-injurious ideation or behavior indicators observed or expressed   Agreement Not to Harm Self Yes  Description of Agreement verbal  Danger to Others  Danger to Others None reported or observed

## 2023-05-11 NOTE — Plan of Care (Signed)

## 2023-05-12 DIAGNOSIS — F250 Schizoaffective disorder, bipolar type: Secondary | ICD-10-CM | POA: Diagnosis not present

## 2023-05-12 LAB — GLUCOSE, CAPILLARY: Glucose-Capillary: 112 mg/dL — ABNORMAL HIGH (ref 70–99)

## 2023-05-12 NOTE — Progress Notes (Signed)
   05/12/23 0114  Psych Admission Type (Psych Patients Only)  Admission Status Voluntary  Psychosocial Assessment  Patient Complaints Anxiety;Depression  Eye Contact Fair  Facial Expression Anxious  Affect Appropriate to circumstance  Speech Logical/coherent  Interaction Assertive  Motor Activity Restless  Appearance/Hygiene Unremarkable  Behavior Characteristics Appropriate to situation  Mood Depressed  Thought Process  Coherency WDL  Content WDL  Delusions None reported or observed  Perception WDL  Hallucination None reported or observed  Judgment Poor  Confusion None  Danger to Self  Current suicidal ideation? Denies  Agreement Not to Harm Self Yes  Description of Agreement verbal  Danger to Others  Danger to Others None reported or observed

## 2023-05-12 NOTE — Progress Notes (Signed)
  [facility] Adult Case Management Discharge Plan :  Will you be returning to the same living situation after discharge:  Yes,  Pt will be returning to [facility] at discharge At discharge, do you have transportation home?: Yes,  Pt will be picked up by QP, Renesha at 5:00PM Do you have the ability to pay for your medications: Yes,  pt has [employer]  Release of information consent forms completed and in the chart;  Patient's signature needed at discharge.  Patient to Follow up at:  Follow-up Information     Vinita Amble, PA-C. Go on 06/07/2023.   Specialty: Physician Assistant Why: You have an appointment on 06/07/23 at 10:20 am for medication management services, in person. Contact information: [address] [place] [address] [phone]         [place], [facility] Go to.   Specialty: Professional Counselor Why: Please go to this provider for therapy services on 05/15/23 at 9:00AM to establish services.  Walk in hours for new patients are Monday through Friday, from 9 am to 1 pm. Contact information: [address] [place]  [address] [place] [address] [phone]                 Next level of care provider has access to [facility] Link:no  Safety Planning and Suicide Prevention discussed: Chaney Lauraine Becker, [phone]. Legal guardian.  provider: Macky Ege QP manager of alternative Home that pt lives in. [phone]     Has patient been referred to the Quitline?: Patient does not use tobacco/nicotine  products  Patient has been referred for addiction treatment: No known substance use disorder.  Jenkins LULLA Primer, LCSWA 05/12/2023, 10:16 AM

## 2023-05-12 NOTE — Progress Notes (Signed)
     05/12/2023       1:01 PM   Tashana N Voigt   Type of Note: Discussion about legal guardian  Attempted to contact Franky Jefferson, [employer] [place] supervisor for Lauraine Becker [phone]) on patient's behalf. Pt has made multiple attempts with no response. This clinical research associate has called him twice with no response.  CSW was able to reach Tamika, the on call supervisor, who advised that the proper chain of command would be to reach out to [employer]. Due to multiple attempts with no response with Franky Bone provided an email for the director of the [employer]; Odilia Ruth, [email or web address].   Pt is due to discharge later this afternoon but email will be provided to pt. Tamika reports that pt should continue to have this discussion with LG, Lauraine Becker after being discharged.   CSW advised pt.  Signed:  Elza Varricchio, LCSW-A 05/12/2023  1:01 PM

## 2023-05-12 NOTE — Plan of Care (Signed)
   Problem: Education: Goal: Emotional status will improve Outcome: Progressing Goal: Mental status will improve Outcome: Progressing   Problem: Activity: Goal: Interest or engagement in activities will improve Outcome: Progressing Goal: Sleeping patterns will improve Outcome: Progressing
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
## 2023-05-12 NOTE — Group Note (Signed)
 Date:  05/12/2023 Time:  10:34 AM  Group Topic/Focus:  Goals Group:   The focus of this group is to help patients establish daily goals to achieve during treatment and discuss how the patient can incorporate goal setting into their daily lives to aide in recovery. Orientation:   The focus of this group is to educate the patient on the purpose and policies of crisis stabilization and provide a format to answer questions about their admission.  The group details unit policies and expectations of patients while admitted.    Participation Level:  Did Not Attend  Participation Quality:   n/a  Affect:   n/a  Cognitive:   n/a  Insight: None  Engagement in Group:   n/a  Modes of Intervention:   n/a  Additional Comments:   Pt did not attend the group.  Addison HERO Atilano Covelli 05/12/2023, 10:34 AM

## 2023-05-12 NOTE — Progress Notes (Signed)
   05/12/23 0830  Psych Admission Type (Psych Patients Only)  Admission Status Voluntary  Psychosocial Assessment  Patient Complaints Anxiety;Depression  Eye Contact Fair  Facial Expression Animated;Anxious  Affect Appropriate to circumstance  Speech Logical/coherent  Interaction Assertive  Appearance/Hygiene Unremarkable  Behavior Characteristics Cooperative;Anxious  Mood Depressed;Anxious  Thought Process  Coherency WDL  Content WDL  Delusions None reported or observed  Perception WDL  Hallucination None reported or observed  Judgment Impaired  Confusion None  Danger to Self  Current suicidal ideation? Denies  Description of Suicide Plan No plan  Self-Injurious Behavior No self-injurious ideation or behavior indicators observed or expressed   Agreement Not to Harm Self Yes  Description of Agreement Verbal  Danger to Others  Danger to Others None reported or observed

## 2023-05-12 NOTE — Progress Notes (Addendum)
 Patient discharged from [facility] on 05/12/23. Patient denies SI, plan, and intention. Suicide safety plan completed, reviewed with this RN, given to the patient, and a copy in the chart. Patient denies HI/AVH upon discharge. Patient is alert, oriented, and cooperative. RN provided patient with discharge paperwork and reviewed information with patient. Patient expressed that she understood all of the discharge instructions. Pt was satisfied with belongings returned to her from the locker and at bedside. Discharged patient to [facility] waiting room.

## 2023-05-12 NOTE — Group Note (Signed)
 Recreation Therapy Group Note   Group Topic:Team Building  Group Date: 05/12/2023 Start Time: 0930 End Time: 9046 Facilitators: Dawit Tankard-McCall, LRT,CTRS Location: [address] Dayroom   Group Topic: Communication, Team Building, Problem Solving  Goal Area(s) Addresses:  Patient will effectively work with peer towards shared goal.  Patient will identify skills used to make activity successful.  Patient will identify how skills used during activity can be used to reach post d/c goals.   Intervention: STEM Activity  Group Description: Straw Bridge. In teams of 3-5, patients were given 15 plastic drinking straws and an equal length of masking tape. Using the materials provided, patients were instructed to build a free standing bridge-like structure to suspend an everyday item (ex: puzzle box) off of the floor or table surface. All materials were required to be used by the team in their design. LRT facilitated post-activity discussion reviewing team process. Patients were encouraged to reflect how the skills used in this activity can be generalized to daily life post discharge.   Education: Pharmacist, Community, Scientist, Physiological, Discharge Planning   Education Outcome: Acknowledges education/In group clarification offered/Needs additional education.    Affect/Mood: N/A   Participation Level: Did not attend    Clinical Observations/Individualized Feedback:      Plan: Continue to engage patient in RT group sessions 2-3x/week.   Pennie Vanblarcom-McCall, LRT,CTRS  05/12/2023 12:01 PM
